# Patient Record
Sex: Male | Born: 1970 | Race: Black or African American | Hispanic: No | Smoking: Never smoker
Health system: Southern US, Community
[De-identification: ages and names within clinical notes are randomized; demographics above are authoritative.]

## PROBLEM LIST (undated history)

## (undated) DIAGNOSIS — Z95810 Presence of automatic (implantable) cardiac defibrillator: Secondary | ICD-10-CM

## (undated) DIAGNOSIS — R0600 Dyspnea, unspecified: Secondary | ICD-10-CM

## (undated) DIAGNOSIS — R569 Unspecified convulsions: Secondary | ICD-10-CM

## (undated) DIAGNOSIS — E785 Hyperlipidemia, unspecified: Secondary | ICD-10-CM

## (undated) DIAGNOSIS — I509 Heart failure, unspecified: Secondary | ICD-10-CM

## (undated) DIAGNOSIS — C801 Malignant (primary) neoplasm, unspecified: Secondary | ICD-10-CM

## (undated) DIAGNOSIS — G4733 Obstructive sleep apnea (adult) (pediatric): Secondary | ICD-10-CM

## (undated) DIAGNOSIS — E559 Vitamin D deficiency, unspecified: Secondary | ICD-10-CM

## (undated) DIAGNOSIS — I10 Essential (primary) hypertension: Secondary | ICD-10-CM

## (undated) DIAGNOSIS — E139 Other specified diabetes mellitus without complications: Secondary | ICD-10-CM

## (undated) DIAGNOSIS — N529 Male erectile dysfunction, unspecified: Secondary | ICD-10-CM

## (undated) DIAGNOSIS — I255 Ischemic cardiomyopathy: Secondary | ICD-10-CM

## (undated) DIAGNOSIS — I5020 Unspecified systolic (congestive) heart failure: Secondary | ICD-10-CM

## (undated) DIAGNOSIS — G709 Myoneural disorder, unspecified: Secondary | ICD-10-CM

## (undated) DIAGNOSIS — J45909 Unspecified asthma, uncomplicated: Secondary | ICD-10-CM

## (undated) DIAGNOSIS — E119 Type 2 diabetes mellitus without complications: Secondary | ICD-10-CM

## (undated) DIAGNOSIS — I219 Acute myocardial infarction, unspecified: Secondary | ICD-10-CM

## (undated) DIAGNOSIS — K219 Gastro-esophageal reflux disease without esophagitis: Secondary | ICD-10-CM

## (undated) DIAGNOSIS — I2510 Atherosclerotic heart disease of native coronary artery without angina pectoris: Secondary | ICD-10-CM

## (undated) HISTORY — PX: CORONARY ANGIOPLASTY: SHX604

---

## 2006-10-11 HISTORY — PX: WRIST SURGERY: SHX841

## 2007-08-23 ENCOUNTER — Ambulatory Visit: Payer: Self-pay | Admitting: Orthopedic Surgery

## 2007-08-30 ENCOUNTER — Ambulatory Visit: Payer: Self-pay | Admitting: Orthopedic Surgery

## 2009-06-19 ENCOUNTER — Ambulatory Visit: Payer: Self-pay | Admitting: Internal Medicine

## 2009-10-11 DIAGNOSIS — Z95810 Presence of automatic (implantable) cardiac defibrillator: Secondary | ICD-10-CM

## 2009-10-11 DIAGNOSIS — I219 Acute myocardial infarction, unspecified: Secondary | ICD-10-CM

## 2009-10-11 HISTORY — PX: CORONARY ANGIOPLASTY: SHX604

## 2009-10-11 HISTORY — DX: Presence of automatic (implantable) cardiac defibrillator: Z95.810

## 2009-10-11 HISTORY — DX: Acute myocardial infarction, unspecified: I21.9

## 2010-02-13 ENCOUNTER — Emergency Department: Payer: Self-pay | Admitting: Internal Medicine

## 2010-03-11 DIAGNOSIS — I219 Acute myocardial infarction, unspecified: Secondary | ICD-10-CM | POA: Insufficient documentation

## 2010-03-24 ENCOUNTER — Emergency Department: Payer: Self-pay | Admitting: Emergency Medicine

## 2010-04-06 ENCOUNTER — Inpatient Hospital Stay: Payer: Self-pay | Admitting: Internal Medicine

## 2010-04-20 ENCOUNTER — Inpatient Hospital Stay: Payer: Self-pay | Admitting: Internal Medicine

## 2010-05-14 ENCOUNTER — Ambulatory Visit: Payer: Self-pay | Admitting: Family

## 2010-06-17 ENCOUNTER — Ambulatory Visit: Payer: Self-pay | Admitting: Family

## 2010-07-25 ENCOUNTER — Observation Stay: Payer: Self-pay | Admitting: Internal Medicine

## 2010-08-30 HISTORY — PX: CARDIAC DEFIBRILLATOR PLACEMENT: SHX171

## 2010-09-24 ENCOUNTER — Ambulatory Visit: Payer: Self-pay | Admitting: Family

## 2011-01-07 ENCOUNTER — Ambulatory Visit: Payer: Self-pay | Admitting: Family

## 2011-08-18 DIAGNOSIS — K219 Gastro-esophageal reflux disease without esophagitis: Secondary | ICD-10-CM | POA: Insufficient documentation

## 2011-08-18 DIAGNOSIS — E785 Hyperlipidemia, unspecified: Secondary | ICD-10-CM | POA: Insufficient documentation

## 2011-08-18 DIAGNOSIS — I255 Ischemic cardiomyopathy: Secondary | ICD-10-CM | POA: Insufficient documentation

## 2011-08-18 DIAGNOSIS — Z95810 Presence of automatic (implantable) cardiac defibrillator: Secondary | ICD-10-CM | POA: Insufficient documentation

## 2011-09-15 DIAGNOSIS — E139 Other specified diabetes mellitus without complications: Secondary | ICD-10-CM | POA: Insufficient documentation

## 2011-11-19 ENCOUNTER — Emergency Department: Payer: Self-pay | Admitting: Emergency Medicine

## 2011-11-19 LAB — RAPID INFLUENZA A&B ANTIGENS

## 2011-12-13 ENCOUNTER — Emergency Department: Payer: Self-pay | Admitting: *Deleted

## 2011-12-13 LAB — CBC
HCT: 46 % (ref 40.0–52.0)
MCH: 30.2 pg (ref 26.0–34.0)
MCHC: 32.8 g/dL (ref 32.0–36.0)
MCV: 92 fL (ref 80–100)
Platelet: 145 10*3/uL — ABNORMAL LOW (ref 150–440)
RBC: 4.99 10*6/uL (ref 4.40–5.90)

## 2011-12-13 LAB — TROPONIN I: Troponin-I: 0.02 ng/mL

## 2011-12-13 LAB — COMPREHENSIVE METABOLIC PANEL
Albumin: 4 g/dL (ref 3.4–5.0)
Anion Gap: 7 (ref 7–16)
BUN: 11 mg/dL (ref 7–18)
Bilirubin,Total: 0.6 mg/dL (ref 0.2–1.0)
Chloride: 106 mmol/L (ref 98–107)
Co2: 29 mmol/L (ref 21–32)
Creatinine: 1.21 mg/dL (ref 0.60–1.30)
EGFR (African American): >60
Osmolality: 280 (ref 275–301)
Potassium: 4 mmol/L (ref 3.5–5.1)
SGOT(AST): 32 U/L (ref 15–37)
Total Protein: 7.4 g/dL (ref 6.4–8.2)

## 2011-12-14 LAB — TROPONIN I: Troponin-I: 0.02 ng/mL

## 2011-12-20 ENCOUNTER — Emergency Department: Payer: Self-pay | Admitting: Unknown Physician Specialty

## 2011-12-21 LAB — TROPONIN I: Troponin-I: <0.02 ng/mL

## 2011-12-21 LAB — COMPREHENSIVE METABOLIC PANEL
Albumin: 3.7 g/dL (ref 3.4–5.0)
Alkaline Phosphatase: 90 U/L (ref 50–136)
Anion Gap: 12 (ref 7–16)
BUN: 18 mg/dL (ref 7–18)
Calcium, Total: 8.6 mg/dL (ref 8.5–10.1)
Chloride: 102 mmol/L (ref 98–107)
Co2: 26 mmol/L (ref 21–32)
EGFR (African American): >60
Glucose: 233 mg/dL — ABNORMAL HIGH (ref 65–99)
Osmolality: 289 (ref 275–301)
SGOT(AST): 36 U/L (ref 15–37)
Sodium: 140 mmol/L (ref 136–145)

## 2011-12-21 LAB — CBC
HCT: 45 % (ref 40.0–52.0)
HGB: 14.9 g/dL (ref 13.0–18.0)
MCH: 30.5 pg (ref 26.0–34.0)
MCHC: 33.2 g/dL (ref 32.0–36.0)
MCV: 92 fL (ref 80–100)
Platelet: 131 10*3/uL — ABNORMAL LOW (ref 150–440)
RBC: 4.9 10*6/uL (ref 4.40–5.90)

## 2013-10-04 ENCOUNTER — Emergency Department: Payer: Self-pay | Admitting: Emergency Medicine

## 2014-01-21 ENCOUNTER — Emergency Department: Payer: Self-pay | Admitting: Emergency Medicine

## 2014-03-07 DIAGNOSIS — Z9889 Other specified postprocedural states: Secondary | ICD-10-CM | POA: Insufficient documentation

## 2015-01-15 ENCOUNTER — Emergency Department: Admit: 2015-01-15 | Disposition: A | Discharge status: Home / Self Care | Payer: Self-pay | Admitting: Student

## 2015-01-15 LAB — CBC WITH DIFFERENTIAL/PLATELET
Basophil #: 0 10*3/uL (ref 0.0–0.1)
Basophil %: 0.6 %
Eosinophil #: 0 10*3/uL (ref 0.0–0.7)
Eosinophil %: 0.2 %
HCT: 40.9 % (ref 40.0–52.0)
HGB: 13.5 g/dL (ref 13.0–18.0)
LYMPHS ABS: 2 10*3/uL (ref 1.0–3.6)
Lymphocyte %: 24.3 %
MCH: 29.7 pg (ref 26.0–34.0)
MCHC: 33.1 g/dL (ref 32.0–36.0)
MCV: 90 fL (ref 80–100)
MONO ABS: 0.6 x10 3/mm (ref 0.2–1.0)
MONOS PCT: 6.9 %
NEUTROS ABS: 5.7 10*3/uL (ref 1.4–6.5)
Neutrophil %: 68 %
PLATELETS: 140 10*3/uL — AB (ref 150–440)
RBC: 4.56 10*6/uL (ref 4.40–5.90)
RDW: 13.6 % (ref 11.5–14.5)
WBC: 8.4 10*3/uL (ref 3.8–10.6)

## 2015-01-15 LAB — BASIC METABOLIC PANEL
Anion Gap: 7 (ref 7–16)
BUN: 18 mg/dL
Calcium, Total: 8.7 mg/dL — ABNORMAL LOW
Chloride: 104 mmol/L
Co2: 25 mmol/L
Creatinine: 0.92 mg/dL
EGFR (African American): >60
GLUCOSE: 331 mg/dL — AB
Potassium: 4 mmol/L
Sodium: 136 mmol/L

## 2015-08-07 DIAGNOSIS — E1065 Type 1 diabetes mellitus with hyperglycemia: Secondary | ICD-10-CM | POA: Insufficient documentation

## 2015-08-19 DIAGNOSIS — R569 Unspecified convulsions: Secondary | ICD-10-CM | POA: Insufficient documentation

## 2015-08-27 ENCOUNTER — Other Ambulatory Visit: Payer: Self-pay | Admitting: Unknown Physician Specialty

## 2016-04-18 ENCOUNTER — Emergency Department: Payer: 59

## 2016-04-18 ENCOUNTER — Inpatient Hospital Stay
Admission: EM | Admit: 2016-04-18 | Discharge: 2016-04-21 | DRG: 637 | Disposition: A | Discharge status: Home / Self Care | Payer: 59 | Attending: Internal Medicine | Admitting: Internal Medicine

## 2016-04-18 ENCOUNTER — Encounter: Payer: Self-pay | Admitting: Emergency Medicine

## 2016-04-18 DIAGNOSIS — I2510 Atherosclerotic heart disease of native coronary artery without angina pectoris: Secondary | ICD-10-CM | POA: Diagnosis present

## 2016-04-18 DIAGNOSIS — E785 Hyperlipidemia, unspecified: Secondary | ICD-10-CM | POA: Diagnosis present

## 2016-04-18 DIAGNOSIS — E1010 Type 1 diabetes mellitus with ketoacidosis without coma: Principal | ICD-10-CM | POA: Diagnosis present

## 2016-04-18 DIAGNOSIS — K2970 Gastritis, unspecified, without bleeding: Secondary | ICD-10-CM | POA: Diagnosis present

## 2016-04-18 DIAGNOSIS — I252 Old myocardial infarction: Secondary | ICD-10-CM | POA: Diagnosis not present

## 2016-04-18 DIAGNOSIS — Z9114 Patient's other noncompliance with medication regimen: Secondary | ICD-10-CM | POA: Diagnosis not present

## 2016-04-18 DIAGNOSIS — R1013 Epigastric pain: Secondary | ICD-10-CM | POA: Diagnosis present

## 2016-04-18 DIAGNOSIS — E1110 Type 2 diabetes mellitus with ketoacidosis without coma: Secondary | ICD-10-CM

## 2016-04-18 DIAGNOSIS — J189 Pneumonia, unspecified organism: Secondary | ICD-10-CM | POA: Diagnosis present

## 2016-04-18 DIAGNOSIS — I10 Essential (primary) hypertension: Secondary | ICD-10-CM

## 2016-04-18 DIAGNOSIS — R109 Unspecified abdominal pain: Secondary | ICD-10-CM

## 2016-04-18 HISTORY — DX: Hyperlipidemia, unspecified: E78.5

## 2016-04-18 HISTORY — DX: Acute myocardial infarction, unspecified: I21.9

## 2016-04-18 HISTORY — DX: Essential (primary) hypertension: I10

## 2016-04-18 HISTORY — DX: Type 2 diabetes mellitus without complications: E11.9

## 2016-04-18 LAB — GLUCOSE, CAPILLARY
GLUCOSE-CAPILLARY: 459 mg/dL — AB (ref 65–99)
GLUCOSE-CAPILLARY: 366 mg/dL — AB (ref 65–99)
GLUCOSE-CAPILLARY: 436 mg/dL — AB (ref 65–99)
GLUCOSE-CAPILLARY: 355 mg/dL — AB (ref 65–99)
Glucose-Capillary: 398 mg/dL — ABNORMAL HIGH (ref 65–99)
Glucose-Capillary: 383 mg/dL — ABNORMAL HIGH (ref 65–99)

## 2016-04-18 LAB — COMPREHENSIVE METABOLIC PANEL
ALBUMIN: 3.6 g/dL (ref 3.5–5.0)
ALK PHOS: 125 U/L (ref 38–126)
ALT: 48 U/L (ref 17–63)
AST: 48 U/L — AB (ref 15–41)
Anion gap: 17 — ABNORMAL HIGH (ref 5–15)
BUN: 22 mg/dL — AB (ref 6–20)
CO2: 15 mmol/L — AB (ref 22–32)
Calcium: 8.6 mg/dL — ABNORMAL LOW (ref 8.9–10.3)
Chloride: 103 mmol/L (ref 101–111)
Creatinine, Ser: 1.21 mg/dL (ref 0.61–1.24)
GFR calc non Af Amer: >60 mL/min (ref >60)
GLUCOSE: 441 mg/dL — AB (ref 65–99)
Potassium: 4.6 mmol/L (ref 3.5–5.1)
Sodium: 135 mmol/L (ref 135–145)
Total Bilirubin: 2 mg/dL — ABNORMAL HIGH (ref 0.3–1.2)
Total Protein: 6.9 g/dL (ref 6.5–8.1)

## 2016-04-18 LAB — CBC
HCT: 47.2 % (ref 40.0–52.0)
Hemoglobin: 16 g/dL (ref 13.0–18.0)
MCH: 30.6 pg (ref 26.0–34.0)
MCHC: 34 g/dL (ref 32.0–36.0)
MCV: 90 fL (ref 80.0–100.0)
PLATELETS: 124 10*3/uL — AB (ref 150–440)
RBC: 5.24 MIL/uL (ref 4.40–5.90)
RDW: 13.1 % (ref 11.5–14.5)
WBC: 6.8 10*3/uL (ref 3.8–10.6)

## 2016-04-18 LAB — URINALYSIS COMPLETE WITH MICROSCOPIC (ARMC ONLY)
BILIRUBIN URINE: NEGATIVE
Glucose, UA: >500 mg/dL — AB
Leukocytes, UA: NEGATIVE
Nitrite: NEGATIVE
PROTEIN: 100 mg/dL — AB
SQUAMOUS EPITHELIAL / LPF: NONE SEEN
Specific Gravity, Urine: 1.028 (ref 1.005–1.030)
pH: 5 (ref 5.0–8.0)

## 2016-04-18 LAB — BASIC METABOLIC PANEL
ANION GAP: 11 (ref 5–15)
BUN: 24 mg/dL — ABNORMAL HIGH (ref 6–20)
CALCIUM: 7.7 mg/dL — AB (ref 8.9–10.3)
CHLORIDE: 107 mmol/L (ref 101–111)
CO2: 17 mmol/L — AB (ref 22–32)
CREATININE: 1.3 mg/dL — AB (ref 0.61–1.24)
GFR calc non Af Amer: >60 mL/min (ref >60)
Glucose, Bld: 392 mg/dL — ABNORMAL HIGH (ref 65–99)
Potassium: 4.9 mmol/L (ref 3.5–5.1)
SODIUM: 135 mmol/L (ref 135–145)

## 2016-04-18 LAB — LIPASE, BLOOD: Lipase: 12 U/L (ref 11–51)

## 2016-04-18 LAB — TROPONIN I

## 2016-04-18 MED ORDER — ONDANSETRON HCL 4 MG PO TABS
4.0000 mg | ORAL_TABLET | Freq: Four times a day (QID) | ORAL | Status: DC | PRN
  Administered 2016-04-18: 4 mg via ORAL
  Filled 2016-04-18: qty 1

## 2016-04-18 MED ORDER — ONDANSETRON HCL 4 MG/2ML IJ SOLN
4.0000 mg | Freq: Once | INTRAMUSCULAR | Status: AC
  Administered 2016-04-18: 4 mg via INTRAVENOUS

## 2016-04-18 MED ORDER — VITAMIN D 1000 UNITS PO TABS
2000.0000 [IU] | ORAL_TABLET | Freq: Every day | ORAL | Status: DC
  Administered 2016-04-18 – 2016-04-21 (×4): 2000 [IU] via ORAL
  Filled 2016-04-18 (×7): qty 2

## 2016-04-18 MED ORDER — GUAIFENESIN ER 600 MG PO TB12
1200.0000 mg | ORAL_TABLET | Freq: Two times a day (BID) | ORAL | Status: DC
  Administered 2016-04-18 – 2016-04-21 (×7): 1200 mg via ORAL
  Filled 2016-04-18 (×8): qty 2

## 2016-04-18 MED ORDER — ASPIRIN EC 81 MG PO TBEC
81.0000 mg | DELAYED_RELEASE_TABLET | ORAL | Status: DC
  Administered 2016-04-19 – 2016-04-21 (×3): 81 mg via ORAL
  Filled 2016-04-18 (×3): qty 1

## 2016-04-18 MED ORDER — LOSARTAN POTASSIUM 50 MG PO TABS
100.0000 mg | ORAL_TABLET | Freq: Every day | ORAL | Status: DC
  Administered 2016-04-18 – 2016-04-21 (×4): 100 mg via ORAL
  Filled 2016-04-18 (×3): qty 2

## 2016-04-18 MED ORDER — DEXTROSE 5 % IV SOLN
1.0000 g | Freq: Once | INTRAVENOUS | Status: AC
  Administered 2016-04-18: 1 g via INTRAVENOUS
  Filled 2016-04-18: qty 10

## 2016-04-18 MED ORDER — METOPROLOL TARTRATE 25 MG PO TABS
ORAL_TABLET | ORAL | Status: AC
  Administered 2016-04-18: 25 mg via ORAL
  Filled 2016-04-18: qty 1

## 2016-04-18 MED ORDER — HYDRALAZINE HCL 20 MG/ML IJ SOLN
10.0000 mg | INTRAMUSCULAR | Status: DC | PRN
  Administered 2016-04-20 – 2016-04-21 (×2): 10 mg via INTRAVENOUS
  Filled 2016-04-18 (×2): qty 1

## 2016-04-18 MED ORDER — FLUTICASONE PROPIONATE 50 MCG/ACT NA SUSP
2.0000 | Freq: Every day | NASAL | Status: DC | PRN
  Administered 2016-04-19 – 2016-04-21 (×2): 2 via NASAL
  Filled 2016-04-18 (×2): qty 16

## 2016-04-18 MED ORDER — HEPARIN SODIUM (PORCINE) 5000 UNIT/ML IJ SOLN
5000.0000 [IU] | Freq: Three times a day (TID) | INTRAMUSCULAR | Status: DC
  Administered 2016-04-18 – 2016-04-21 (×8): 5000 [IU] via SUBCUTANEOUS
  Filled 2016-04-18 (×8): qty 1

## 2016-04-18 MED ORDER — BISACODYL 10 MG RE SUPP: 10.0000 mg | Freq: Every day | RECTAL | Status: DC | PRN

## 2016-04-18 MED ORDER — SODIUM CHLORIDE 0.9 % IV BOLUS (SEPSIS)
1000.0000 mL | Freq: Once | INTRAVENOUS | Status: AC
  Administered 2016-04-18: 1000 mL via INTRAVENOUS

## 2016-04-18 MED ORDER — METOCLOPRAMIDE HCL 10 MG PO TABS
10.0000 mg | ORAL_TABLET | Freq: Every day | ORAL | Status: DC
  Administered 2016-04-18 – 2016-04-20 (×3): 10 mg via ORAL
  Filled 2016-04-18 (×3): qty 1

## 2016-04-18 MED ORDER — INSULIN ASPART 100 UNIT/ML IV SOLN
12.0000 [IU] | Freq: Once | INTRAVENOUS | Status: AC
  Administered 2016-04-18: 12 [IU] via INTRAVENOUS
  Filled 2016-04-18: qty 0.12

## 2016-04-18 MED ORDER — DIGOXIN 125 MCG PO TABS
125.0000 ug | ORAL_TABLET | Freq: Every day | ORAL | Status: DC
  Administered 2016-04-19 – 2016-04-21 (×3): 125 ug via ORAL
  Filled 2016-04-18 (×3): qty 1

## 2016-04-18 MED ORDER — METOPROLOL TARTRATE 25 MG PO TABS
25.0000 mg | ORAL_TABLET | Freq: Two times a day (BID) | ORAL | Status: DC
  Administered 2016-04-18: 25 mg via ORAL

## 2016-04-18 MED ORDER — HYDROMORPHONE HCL 1 MG/ML IJ SOLN
1.0000 mg | INTRAMUSCULAR | Status: DC | PRN
  Administered 2016-04-20 (×2): 1 mg via INTRAVENOUS
  Filled 2016-04-18 (×2): qty 1

## 2016-04-18 MED ORDER — INSULIN ASPART 100 UNIT/ML ~~LOC~~ SOLN
12.0000 [IU] | Freq: Once | SUBCUTANEOUS | Status: AC
  Administered 2016-04-18: 12 [IU] via SUBCUTANEOUS
  Filled 2016-04-18: qty 12

## 2016-04-18 MED ORDER — INSULIN ASPART 100 UNIT/ML ~~LOC~~ SOLN: 0.0000 [IU] | Freq: Three times a day (TID) | SUBCUTANEOUS | Status: DC

## 2016-04-18 MED ORDER — DEXTROSE 5 % IV SOLN
500.0000 mg | Freq: Every day | INTRAVENOUS | Status: DC
  Administered 2016-04-18: 500 mg via INTRAVENOUS
  Filled 2016-04-18 (×2): qty 500

## 2016-04-18 MED ORDER — IPRATROPIUM-ALBUTEROL 0.5-2.5 (3) MG/3ML IN SOLN
3.0000 mL | Freq: Four times a day (QID) | RESPIRATORY_TRACT | Status: DC
  Administered 2016-04-18 – 2016-04-19 (×3): 3 mL via RESPIRATORY_TRACT
  Filled 2016-04-18 (×4): qty 3

## 2016-04-18 MED ORDER — SODIUM CHLORIDE 0.9% FLUSH
3.0000 mL | Freq: Two times a day (BID) | INTRAVENOUS | Status: DC
Start: 2016-04-18 — End: 2016-04-21
  Administered 2016-04-19 – 2016-04-20 (×3): 3 mL via INTRAVENOUS

## 2016-04-18 MED ORDER — HYDROMORPHONE HCL 1 MG/ML IJ SOLN
1.0000 mg | Freq: Once | INTRAMUSCULAR | Status: AC
  Administered 2016-04-18: 1 mg via INTRAVENOUS

## 2016-04-18 MED ORDER — FUROSEMIDE 40 MG PO TABS
20.0000 mg | ORAL_TABLET | Freq: Every day | ORAL | Status: DC
  Administered 2016-04-18 – 2016-04-21 (×4): 20 mg via ORAL
  Filled 2016-04-18 (×4): qty 1

## 2016-04-18 MED ORDER — CARVEDILOL 12.5 MG PO TABS
12.5000 mg | ORAL_TABLET | Freq: Two times a day (BID) | ORAL | Status: DC
  Administered 2016-04-18 – 2016-04-21 (×7): 12.5 mg via ORAL
  Filled 2016-04-18 (×7): qty 1

## 2016-04-18 MED ORDER — POTASSIUM CHLORIDE CRYS ER 10 MEQ PO TBCR
10.0000 meq | EXTENDED_RELEASE_TABLET | Freq: Every day | ORAL | Status: DC
  Administered 2016-04-18 – 2016-04-21 (×4): 10 meq via ORAL
  Filled 2016-04-18 (×4): qty 1

## 2016-04-18 MED ORDER — SODIUM CHLORIDE 0.9 % IV BOLUS (SEPSIS)
500.0000 mL | Freq: Once | INTRAVENOUS | Status: AC
Start: 2016-04-18 — End: 2016-04-18
  Administered 2016-04-18: 500 mL via INTRAVENOUS

## 2016-04-18 MED ORDER — SODIUM CHLORIDE 0.9 % IV SOLN
INTRAVENOUS | Status: DC
  Administered 2016-04-18 – 2016-04-20 (×6): via INTRAVENOUS

## 2016-04-18 MED ORDER — DOCUSATE SODIUM 100 MG PO CAPS
100.0000 mg | ORAL_CAPSULE | Freq: Two times a day (BID) | ORAL | Status: DC
  Administered 2016-04-18 – 2016-04-20 (×5): 100 mg via ORAL
  Filled 2016-04-18 (×7): qty 1

## 2016-04-18 MED ORDER — PIPERACILLIN-TAZOBACTAM 3.375 G IVPB
3.3750 g | Freq: Three times a day (TID) | INTRAVENOUS | Status: DC
  Administered 2016-04-18 – 2016-04-20 (×7): 3.375 g via INTRAVENOUS
  Filled 2016-04-18 (×9): qty 50

## 2016-04-18 MED ORDER — INSULIN DETEMIR 100 UNIT/ML ~~LOC~~ SOLN
25.0000 [IU] | Freq: Two times a day (BID) | SUBCUTANEOUS | Status: DC
  Administered 2016-04-18 – 2016-04-19 (×4): 25 [IU] via SUBCUTANEOUS
  Filled 2016-04-18 (×7): qty 0.25

## 2016-04-18 MED ORDER — ACETAMINOPHEN 325 MG PO TABS
650.0000 mg | ORAL_TABLET | Freq: Four times a day (QID) | ORAL | Status: DC | PRN
  Administered 2016-04-18 – 2016-04-19 (×2): 650 mg via ORAL
  Filled 2016-04-18: qty 2

## 2016-04-18 MED ORDER — LOSARTAN POTASSIUM 25 MG PO TABS
ORAL_TABLET | ORAL | Status: AC
  Administered 2016-04-18: 100 mg via ORAL
  Filled 2016-04-18: qty 4

## 2016-04-18 MED ORDER — ONDANSETRON HCL 4 MG/2ML IJ SOLN: 4.0000 mg | Freq: Four times a day (QID) | INTRAMUSCULAR | Status: DC | PRN

## 2016-04-18 MED ORDER — SODIUM CHLORIDE 0.9 % IV BOLUS (SEPSIS)
1000.0000 mL | Freq: Once | INTRAVENOUS | Status: AC
  Administered 2016-04-18: 500 mL via INTRAVENOUS

## 2016-04-18 MED ORDER — ALUM & MAG HYDROXIDE-SIMETH 200-200-20 MG/5ML PO SUSP
15.0000 mL | ORAL | Status: DC | PRN
  Administered 2016-04-18 – 2016-04-20 (×4): 15 mL via ORAL
  Filled 2016-04-18 (×4): qty 30

## 2016-04-18 MED ORDER — ACETAMINOPHEN 650 MG RE SUPP: 650.0000 mg | Freq: Four times a day (QID) | RECTAL | Status: DC | PRN

## 2016-04-18 MED ORDER — ATORVASTATIN CALCIUM 20 MG PO TABS
40.0000 mg | ORAL_TABLET | Freq: Every day | ORAL | Status: DC
  Administered 2016-04-18 – 2016-04-20 (×3): 40 mg via ORAL
  Filled 2016-04-18 (×3): qty 2

## 2016-04-18 MED ORDER — INSULIN REGULAR HUMAN 100 UNIT/ML IJ SOLN: 12.0000 [IU] | Freq: Once | INTRAMUSCULAR | Status: DC

## 2016-04-18 MED ORDER — INSULIN ASPART 100 UNIT/ML ~~LOC~~ SOLN
6.0000 [IU] | Freq: Once | SUBCUTANEOUS | Status: AC
  Administered 2016-04-18: 6 [IU] via INTRAVENOUS

## 2016-04-18 NOTE — ED Notes (Signed)
Pt returned from xray

## 2016-04-18 NOTE — ED Notes (Signed)
Pt transported for Echocardiogram

## 2016-04-18 NOTE — H&P (Signed)
History and Physical    Joseph [name] [id number] DOB: [birth year] DOA: 04/18/2016  Referring physician: Dr. Marcelene Butte PCP: [facility]  Specialists: none  Chief Complaint: abdominal pain with N/V/D  HPI: Joseph [name] is a 45 y.o. male has a past medical history significant for Dm and HTn who presents to the ER with epigatric and RUQ abdominal pain with N/V/D. Has had a recent sinus infection. Has not been taking his insulin as prescribed. In the ER, the patient's blood sugars with >400 with an anion gap noted. CXR reveals pneumonia. BP is extremely high. He is now admitted. Denies Cp or SOB.  Review of Systems: The patient denies anorexia, fever, weight loss,, vision loss, decreased hearing, hoarseness, chest pain, syncope, dyspnea on exertion, peripheral edema, balance deficits, hemoptysis,  melena, hematochezia, severe indigestion/heartburn, hematuria, incontinence, genital sores, muscle weakness, suspicious skin lesions, transient blindness, difficulty walking, depression, unusual weight change, abnormal bleeding, enlarged lymph nodes, angioedema, and breast masses.   Past Medical History  Diagnosis Date  . Diabetes mellitus without complication ([place])   . Hypertension   . Hyperlipidemia   . Myocardial infarction [facility])    Past Surgical History  Procedure Laterality Date  . Cardiac surgery    . Cardiac defibrillator placement     Social History:  reports that he has never smoked. He does not have any smokeless tobacco history on file. He reports that he does not drink alcohol. His drug history is not on file.  Allergies  Allergen Reactions  . Vancomycin Shortness Of Breath  . Lisinopril Rash    History reviewed. No pertinent family history.  Prior to Admission medications   Medication Sig Start Date End Date Taking? Authorizing Provider  aspirin EC 81 MG tablet Take 81 mg by mouth every morning.   Yes Historical Provider, MD  atorvastatin (LIPITOR) 40 MG  tablet Take 40 mg by mouth at bedtime. 02/09/16  Yes Historical Provider, MD  carvedilol (COREG) 12.5 MG tablet Take 12.5 mg by mouth 2 (two) times daily. 02/16/16  Yes Historical Provider, MD  Cholecalciferol (VITAMIN D3) 2000 units capsule Take 2,000 Units by mouth daily. 09/15/11  Yes Historical Provider, MD  digoxin (LANOXIN) 0.125 MG tablet Take 125 mcg by mouth daily. 02/16/16  Yes Historical Provider, MD  fluticasone (FLONASE) 50 MCG/ACT nasal spray Place 2 sprays into both nostrils daily as needed. For allergies/rhinitis. 01/26/16  Yes Historical Provider, MD  furosemide (LASIX) 20 MG tablet Take 20 mg by mouth daily. 02/09/16  Yes Historical Provider, MD  HUMALOG KWIKPEN 100 UNIT/ML KiwkPen Inject 16-20 Units into the skin 3 (three) times daily before meals. 16 units subcutaneous in the morning, 18 units subcutaneous at lunchtime, and 20 units subcutaneous at night. Plus sliding scale as directed. 03/09/16  Yes Historical Provider, MD  Insulin Detemir (LEVEMIR FLEXTOUCH) 100 UNIT/ML Pen Inject 13-30 Units into the skin See admin instructions. Inject 30 units subcutaneous in the morning, and Inject 13 units subcutaneous every night. 09/17/15  Yes Historical Provider, MD  losartan (COZAAR) 100 MG tablet Take 100 mg by mouth at bedtime.   Yes Historical Provider, MD  metoCLOPramide (REGLAN) 10 MG tablet Take 10 mg by mouth at bedtime. 12/15/11  Yes Historical Provider, MD  potassium chloride (MICRO-K) 10 MEQ CR capsule Take 10 mEq by mouth daily. 02/23/16  Yes Historical Provider, MD   Physical Exam: Filed Vitals:   04/18/16 1001 04/18/16 1130  BP: 146/86 180/95  Pulse: 100 96  Temp: 98.6  F (37 C)   TempSrc: Oral   Resp: 18 16  Height: 6\' 1"  (1.854 m)   Weight: 97.523 kg (215 lb)   SpO2: 98% 96%     General:  No apparent distress, WDWN, [place]/AT  Eyes: PERRL, EOMI, no scleral icterus, conjunctiva clear  ENT: moist oropharynx without lesions, TM's benign, dentition fair  Neck: supple, no  lymphadenopathy. No bruits or thyromegaly  Cardiovascular: regular rate without MRG; 2+ peripheral pulses, no JVD, no peripheral edema  Respiratory: basilar rhonchi, good air movement without wheezing or crackles. Respiratory effort normal  Abdomen: soft,  tender to palpation in the epigastrum, positive bowel sounds, no guarding, no rebound  Skin: no rashes or lesions  Musculoskeletal: normal bulk and tone, no joint swelling  Psychiatric: normal mood and affect, A&OX3  Neurologic: CN 2-12 grossly intact, Motor strength 5/5 in all 4 groups with symmetric DTR's and non-focal sensory exam  Labs on Admission:  Basic Metabolic Panel:  Recent Labs Lab 04/18/16 1011  NA 135  K 4.6  CL 103  CO2 15*  GLUCOSE 441*  BUN 22*  CREATININE 1.21  CALCIUM 8.6*   Liver Function Tests:  Recent Labs Lab 04/18/16 1011  AST 48*  ALT 48  ALKPHOS 125  BILITOT 2.0*  PROT 6.9  ALBUMIN 3.6    Recent Labs Lab 04/18/16 1011  LIPASE 12   No results for input(s): AMMONIA in the last 168 hours. CBC:  Recent Labs Lab 04/18/16 1011  WBC 6.8  HGB 16.0  HCT 47.2  MCV 90.0  PLT 124*   Cardiac Enzymes:  Recent Labs Lab 04/18/16 1011  TROPONINI <0.03    BNP (last 3 results) No results for input(s): BNP in the last 8760 hours.  ProBNP (last 3 results) No results for input(s): PROBNP in the last 8760 hours.  CBG:  Recent Labs Lab 04/18/16 1013 04/18/16 1221  GLUCAP 459* 366*    Radiological Exams on Admission: Dg Chest 2 View  04/18/2016  CLINICAL DATA:  Pain for 1 day EXAM: CHEST  2 VIEW COMPARISON:  July 24, 2010 FINDINGS: There is patchy infiltrate in the right upper lobe anteriorly. The lungs elsewhere are clear. Heart size and pulmonary vascularity are normal. No adenopathy. There is a pacemaker with lead tip attached to the right ventricle. No bone lesions. IMPRESSION: Patchy infiltrate consistent with pneumonia anterior segment right upper lobe. Lungs elsewhere  clear. Stable cardiac silhouette. Pacemaker present with lead tip attached to right ventricle. Electronically Signed   By: Lowella Grip III M.D.   On: 04/18/2016 11:13    EKG: Independently reviewed.  Assessment/Plan Principal Problem:   DKA (diabetic ketoacidoses) (HCC) Active Problems:   CAP (community acquired pneumonia)   Accelerated hypertension   Abdominal pain   Will admit to floor with IV fluids and IV ABX and follow sugars closely. [place] of abdomen ordered. Optimize BP regimen. Repeat labs in AM.  Diet: clear liquids Fluids: NS@125  DVT Prophylaxis: SQ Heparin  Code Status: FULL  Family Communication: yes  Disposition Plan: home  Time spent: 50 min

## 2016-04-18 NOTE — ED Notes (Signed)
Pt resting in bed, family at bedside.

## 2016-04-18 NOTE — ED Notes (Signed)
Patient transported to X-ray 

## 2016-04-18 NOTE — ED Provider Notes (Signed)
Time Seen: Approximately 1038 I have reviewed the triage notes  Chief Complaint: Abdominal Pain; Diarrhea; and Emesis   History of Present Illness: Joseph [name] is a 45 y.o. male *who has history of insulin-dependent diabetes and previous cardiovascular disease with a heart catheterization and stent placement in 2011. Patient states he started feeling bad couple of days ago with nasal congestion and then started developing some nausea with occasional vomiting. Patient's only vomited one time today. He states he's developed epigastric abdominal pain and has had 3 loose watery stools today. He states he still feels nauseated. He tried some Naprosyn at home without relief   Past Medical History  Diagnosis Date  . Diabetes mellitus without complication ([place])   . Hypertension   . Hyperlipidemia   . Myocardial infarction ([place])     There are no active problems to display for this patient.   Past Surgical History  Procedure Laterality Date  . Cardiac surgery    . Cardiac defibrillator placement      Past Surgical History  Procedure Laterality Date  . Cardiac surgery    . Cardiac defibrillator placement      No current outpatient prescriptions on file.  Allergies:  Vancomycin and Lisinopril  Family History: History reviewed. No pertinent family history.  Social History: Social History  Substance Use Topics  . Smoking status: Never Smoker   . Smokeless tobacco: None  . Alcohol Use: No     Review of Systems:   10 point review of systems was performed and was otherwise negative:  Constitutional: No fever Eyes: No visual disturbances ENT: No sore throat, ear pain Cardiac: No chest pain Respiratory: No shortness of breath, wheezing, or stridor Abdomen: Epigastric abdominal pain without radiation to the back or flank area. Endocrine: No weight loss, No night sweats Extremities: No peripheral edema, cyanosis Skin: No rashes, easy bruising Neurologic: No focal  weakness, trouble with speech or swollowing Urologic: No dysuria, Hematuria, He does have urinary frequency  Physical Exam:  ED Triage Vitals  Enc Vitals Group     BP 04/18/16 1001 146/86 mmHg     Pulse Rate 04/18/16 1001 100     Resp 04/18/16 1001 18     Temp 04/18/16 1001 98.6 F (37 C)     Temp Source 04/18/16 1001 Oral     SpO2 04/18/16 1001 98 %     Weight 04/18/16 1001 215 lb (97.523 kg)     Height 04/18/16 1001 6\' 1"  (1.854 m)     Head Cir --      Peak Flow --      Pain Score 04/18/16 0958 10     Pain Loc --      Pain Edu? --      Excl. in [place]? --     General: Awake , Alert , and Oriented times 3; GCS 15 Head: Normal cephalic , atraumatic Eyes: Pupils equal , round, reactive to light Nose/Throat: No nasal drainage, patent upper airway without erythema or exudate.  Neck: Supple, Full range of motion, No anterior adenopathy or palpable thyroid masses Lungs: Clear to ascultation without wheezes , rhonchi, or rales Heart: Regular rate, regular rhythm without murmurs , gallops , or rubs Abdomen: Soft, non tender without rebound, guarding , or rigidity; bowel sounds positive and symmetric in all 4 quadrants. No organomegaly .        Extremities: 2 plus symmetric pulses. No edema, clubbing or cyanosis Neurologic: normal ambulation, Motor symmetric without deficits, sensory  intact Skin: warm, dry, no rashes   Labs:   All laboratory work was reviewed including any pertinent negatives or positives listed below:  Labs Reviewed  COMPREHENSIVE METABOLIC PANEL - Abnormal; Notable for the following:    CO2 15 (*)    Glucose, Bld 441 (*)    BUN 22 (*)    Calcium 8.6 (*)    AST 48 (*)    Total Bilirubin 2.0 (*)    Anion gap 17 (*)    All other components within normal limits  CBC - Abnormal; Notable for the following:    Platelets 124 (*)    All other components within normal limits  URINALYSIS COMPLETEWITH MICROSCOPIC ([facility] ONLY) - Abnormal; Notable for the following:     Color, Urine YELLOW (*)    APPearance CLEAR (*)    Glucose, UA >500 (*)    Ketones, ur 2+ (*)    Hgb urine dipstick 2+ (*)    Protein, ur 100 (*)    Bacteria, UA RARE (*)    All other components within normal limits  GLUCOSE, CAPILLARY - Abnormal; Notable for the following:    Glucose-Capillary 459 (*)    All other components within normal limits  LIPASE, BLOOD  TROPONIN I    EKG:  ED ECG REPORT I, Daymon Larsen, the attending physician, personally viewed and interpreted this ECG.  Date: 04/18/2016 EKG Time: 1008 Rate: *88 Rhythm: normal sinus rhythm QRS Axis: normal Intervals: normal ST/T Wave abnormalities: normal Conduction Disturbances: none Narrative Interpretation: unremarkable Right ventricular hypertrophy No acute ischemic changes   Radiology:     DG Chest 2 View (Final result) Result time: 04/18/16 11:13:58   Final result by Rad Results In Interface (04/18/16 11:13:58)   Narrative:   CLINICAL DATA: Pain for 1 day  EXAM: CHEST 2 VIEW  COMPARISON: July 24, 2010  FINDINGS: There is patchy infiltrate in the right upper lobe anteriorly. The lungs elsewhere are clear. Heart size and pulmonary vascularity are normal. No adenopathy. There is a pacemaker with lead tip attached to the right ventricle. No bone lesions.  IMPRESSION: Patchy infiltrate consistent with pneumonia anterior segment right upper lobe. Lungs elsewhere clear. Stable cardiac silhouette. Pacemaker present with lead tip attached to right ventricle.   Electronically Signed By: Lowella Grip III M.D. On: 04/18/2016 11:13       I personally reviewed the radiologic studies    Critical Care: * CRITICAL CARE Performed by: Daymon Larsen   Total critical care time: 37 minutes  Critical care time was exclusive of separately billable procedures and treating other patients.  Critical care was necessary to treat or prevent imminent or life-threatening  deterioration.  Critical care was time spent personally by me on the following activities: development of treatment plan with patient and/or surrogate as well as nursing, discussions with consultants, evaluation of patient's response to treatment, examination of patient, obtaining history from patient or surrogate, ordering and performing treatments and interventions, ordering and review of laboratory studies, ordering and review of radiographic studies, pulse oximetry and re-evaluation of patient's condition. Initial treatment and management of diabetic ketoacidosis    ED Course:  Patient's stay showed some symptomatic improvement his nausea and vomiting he was given Dilaudid and Zofran for pain and started on an IV fluid bolus especially when his urine came back positive for ketosis with an anion gap of 17. Patient was started on insulin bolus and was initiated on 2 L fluid bolus here in emergency department. Patient's  chest x-ray appears to show some early pneumonia which may be the source of his rapidly progressive DKA. Patient does admit to not taking his medication over the last 2 days because he "" felt bad "". He denies any productive cough or wheezing.   Assessment:  Acute diabetic ketoacidosis Community-acquired pneumonia   Final Clinical Impression:   Final diagnoses:  Epigastric pain     Plan: * Inpatient management           Daymon Larsen, MD 04/18/16 1129

## 2016-04-18 NOTE — ED Notes (Signed)
EDP at bedside, pt states sinus infection that began on Thursday, states vomiting and diarrhea with fever since Friday, pt states epigastric pain sharp in nature, states hx of MI with stents, states he has not taken his BP medication in 2 days due to not feeling well

## 2016-04-18 NOTE — ED Notes (Signed)
Pt states sxs started on Thursday with sinus infection sxs along with generalized aches and pains. Pt states he started vomiting x1 today and diarrhea x3-4 today.  Pt reports overnight epigastric pain developed.

## 2016-04-18 NOTE — Progress Notes (Signed)
Patient's blood sugar was checked by NT and recorded at 436.  Recheck done by RN and recorded at 398. Dr. Doy Hutching called and ordered 12units regular insulin IV once with a recheck 2 hours after.  Insulin had to be drawn by pharmacy for IV administration and once received patient was given IV insulin.  Will need recheck of blood sugar at 1945. Lauris Poag, RN 04/18/16  1900

## 2016-04-19 ENCOUNTER — Inpatient Hospital Stay: Payer: 59

## 2016-04-19 ENCOUNTER — Inpatient Hospital Stay
Admit: 2016-04-19 | Discharge: 2016-04-19 | Disposition: A | Discharge status: Home / Self Care | Payer: 59 | Attending: Internal Medicine | Admitting: Internal Medicine

## 2016-04-19 LAB — COMPREHENSIVE METABOLIC PANEL
ALBUMIN: 2.9 g/dL — AB (ref 3.5–5.0)
ALT: 34 U/L (ref 17–63)
ANION GAP: 5 (ref 5–15)
AST: 31 U/L (ref 15–41)
Alkaline Phosphatase: 87 U/L (ref 38–126)
BUN: 26 mg/dL — AB (ref 6–20)
CHLORIDE: 112 mmol/L — AB (ref 101–111)
CO2: 20 mmol/L — AB (ref 22–32)
Calcium: 7.7 mg/dL — ABNORMAL LOW (ref 8.9–10.3)
Creatinine, Ser: 1.09 mg/dL (ref 0.61–1.24)
GFR calc Af Amer: >60 mL/min (ref >60)
GFR calc non Af Amer: >60 mL/min (ref >60)
GLUCOSE: 200 mg/dL — AB (ref 65–99)
POTASSIUM: 3.9 mmol/L (ref 3.5–5.1)
SODIUM: 137 mmol/L (ref 135–145)
Total Bilirubin: 0.9 mg/dL (ref 0.3–1.2)
Total Protein: 5.7 g/dL — ABNORMAL LOW (ref 6.5–8.1)

## 2016-04-19 LAB — GLUCOSE, CAPILLARY
GLUCOSE-CAPILLARY: 128 mg/dL — AB (ref 65–99)
GLUCOSE-CAPILLARY: 207 mg/dL — AB (ref 65–99)
GLUCOSE-CAPILLARY: 154 mg/dL — AB (ref 65–99)
GLUCOSE-CAPILLARY: 127 mg/dL — AB (ref 65–99)
Glucose-Capillary: 212 mg/dL — ABNORMAL HIGH (ref 65–99)
Glucose-Capillary: 312 mg/dL — ABNORMAL HIGH (ref 65–99)

## 2016-04-19 LAB — CBC
HEMATOCRIT: 42 % (ref 40.0–52.0)
HEMOGLOBIN: 14.4 g/dL (ref 13.0–18.0)
MCH: 30.5 pg (ref 26.0–34.0)
MCHC: 34.3 g/dL (ref 32.0–36.0)
MCV: 89.1 fL (ref 80.0–100.0)
Platelets: 115 10*3/uL — ABNORMAL LOW (ref 150–440)
RBC: 4.72 MIL/uL (ref 4.40–5.90)
RDW: 13.3 % (ref 11.5–14.5)
WBC: 6.8 10*3/uL (ref 3.8–10.6)

## 2016-04-19 LAB — ECHOCARDIOGRAM COMPLETE
HEIGHTINCHES: 73 in
WEIGHTICAEL: 3425.6 [oz_av]

## 2016-04-19 MED ORDER — IPRATROPIUM-ALBUTEROL 0.5-2.5 (3) MG/3ML IN SOLN: 3.0000 mL | RESPIRATORY_TRACT | Status: DC | PRN

## 2016-04-19 MED ORDER — AZITHROMYCIN 250 MG PO TABS
500.0000 mg | ORAL_TABLET | Freq: Every day | ORAL | Status: DC
  Administered 2016-04-19 – 2016-04-20 (×2): 500 mg via ORAL
  Filled 2016-04-19 (×2): qty 2

## 2016-04-19 MED ORDER — INSULIN ASPART 100 UNIT/ML ~~LOC~~ SOLN
12.0000 [IU] | Freq: Once | SUBCUTANEOUS | Status: AC
Start: 2016-04-19 — End: 2016-04-19
  Administered 2016-04-19: 12 [IU] via SUBCUTANEOUS
  Filled 2016-04-19: qty 12

## 2016-04-19 MED ORDER — INSULIN ASPART 100 UNIT/ML ~~LOC~~ SOLN
0.0000 [IU] | SUBCUTANEOUS | Status: DC
  Administered 2016-04-19: 3 [IU] via SUBCUTANEOUS
  Administered 2016-04-19 (×2): 5 [IU] via SUBCUTANEOUS
  Administered 2016-04-19 (×2): 2 [IU] via SUBCUTANEOUS
  Administered 2016-04-20: 3 [IU] via SUBCUTANEOUS
  Administered 2016-04-20: 5 [IU] via SUBCUTANEOUS
  Administered 2016-04-20: 3 [IU] via SUBCUTANEOUS
  Administered 2016-04-21: 5 [IU] via SUBCUTANEOUS
  Administered 2016-04-21 (×3): 3 [IU] via SUBCUTANEOUS
  Filled 2016-04-19: qty 5
  Filled 2016-04-19: qty 3
  Filled 2016-04-19 (×2): qty 5
  Filled 2016-04-19 (×3): qty 3
  Filled 2016-04-19: qty 2
  Filled 2016-04-19 (×2): qty 3
  Filled 2016-04-19: qty 5
  Filled 2016-04-19: qty 2

## 2016-04-19 NOTE — Progress Notes (Signed)
PHARMACIST - PHYSICIAN COMMUNICATION DR:   Claria Dice CONCERNING: Antibiotic IV to Oral Route Change Policy  RECOMMENDATION: This patient is receiving Azithromycin by the intravenous route.  Based on criteria approved by the Pharmacy and Therapeutics Committee, the antibiotic(s) is/are being converted to the equivalent oral dose form(s).   DESCRIPTION: These criteria include:  Patient being treated for a respiratory tract infection, urinary tract infection, cellulitis or clostridium difficile associated diarrhea if on metronidazole  The patient is not neutropenic and does not exhibit a GI malabsorption state  The patient is eating (either orally or via tube) and/or has been taking other orally administered medications for a least 24 hours  The patient is improving clinically and has a Tmax < 100.5  If you have questions about this conversion, please contact the Pharmacy Department  []   [phone] )  Joseph [name] [x]   [phone] )  Joseph [name] []   [phone] )  Joseph [name] []   [phone] )  Joseph [name] []   [phone] )  Joseph [name]    Larene Beach, PharmD

## 2016-04-19 NOTE — Progress Notes (Signed)
Pt's blood sugar elevated 383 and temp 100.9. MD made aware, orders placed and admin. BS 312 at 00:13. MD notified and 12 U insulin ordered and admin., Pt's BS to be checked Q 4hrs.

## 2016-04-19 NOTE — Progress Notes (Signed)
A RT protocol assessment evaluation was proformed and because of the assessment score of 3 the nebulizers were changed to prn. The pt. Was instructed to call if he felt sob or wheezy. I started him on an incentive spirometer for volume expansion.

## 2016-04-19 NOTE — Progress Notes (Signed)
[facility] NAME: Joseph [name]    MR#:  [id number]  DATE OF BIRTH:  06/07/1971  SUBJECTIVE:  CHIEF COMPLAINT:   Chief Complaint  Patient presents with  . Abdominal Pain  . Diarrhea  . Emesis      Came with vomiting and nausea, also had some cough for last few days.   Found to be in DKA, and chest x-ray was showing pneumonia.   DKA improved with Insulin, no new complains.    REVIEW OF SYSTEMS:  CONSTITUTIONAL: No fever, fatigue or weakness.  EYES: No blurred or double vision.  EARS, NOSE, AND THROAT: No tinnitus or ear pain.  RESPIRATORY: Some cough, no shortness of breath, wheezing or hemoptysis.  CARDIOVASCULAR: No chest pain, orthopnea, edema.  GASTROINTESTINAL: he had nausea, vomiting, No diarrhea or abdominal pain.  GENITOURINARY: No dysuria, hematuria.  ENDOCRINE: No polyuria, nocturia,  HEMATOLOGY: No anemia, easy bruising or bleeding SKIN: No rash or lesion. MUSCULOSKELETAL: No joint pain or arthritis.   NEUROLOGIC: No tingling, numbness, weakness.  PSYCHIATRY: No anxiety or depression.   ROS  DRUG ALLERGIES:   Allergies  Allergen Reactions  . Vancomycin Shortness Of Breath  . Lisinopril Rash    VITALS:  Blood pressure 157/87, pulse 92, temperature 98.5 F (36.9 C), temperature source Oral, resp. rate 16, height 6\' 1"  (1.854 m), weight 97.115 kg (214 lb 1.6 oz), SpO2 98 %.  PHYSICAL EXAMINATION:  GENERAL:  45 y.o.-year-old patient lying in the bed with no acute distress.  EYES: Pupils equal, round, reactive to light and accommodation. No scleral icterus. Extraocular muscles intact.  HEENT: Head atraumatic, normocephalic. Oropharynx and nasopharynx clear.  NECK:  Supple, no jugular venous distention. No thyroid enlargement, no tenderness.  LUNGS: Normal breath sounds bilaterally, no wheezing, rales,rhonchi or crepitation. No use of accessory muscles of respiration.  CARDIOVASCULAR: S1, S2 normal. No murmurs,  rubs, or gallops.  ABDOMEN: Soft, nontender, nondistended. Bowel sounds present. No organomegaly or mass.  EXTREMITIES: No pedal edema, cyanosis, or clubbing.  NEUROLOGIC: Cranial nerves II through XII are intact. Muscle strength 5/5 in all extremities. Sensation intact. Gait not checked.  PSYCHIATRIC: The patient is alert and oriented x 3.  SKIN: No obvious rash, lesion, or ulcer.   Physical Exam LABORATORY PANEL:   CBC  Recent Labs Lab 04/19/16 0437  WBC 6.8  HGB 14.4  HCT 42.0  PLT 115*   ------------------------------------------------------------------------------------------------------------------  Chemistries   Recent Labs Lab 04/19/16 0437  NA 137  K 3.9  CL 112*  CO2 20*  GLUCOSE 200*  BUN 26*  CREATININE 1.09  CALCIUM 7.7*  AST 31  ALT 34  ALKPHOS 87  BILITOT 0.9   ------------------------------------------------------------------------------------------------------------------  Cardiac Enzymes  Recent Labs Lab 04/18/16 1011  TROPONINI <0.03   ------------------------------------------------------------------------------------------------------------------  RADIOLOGY:  X-ray Chest Pa And Lateral  04/19/2016  CLINICAL DATA:  Community acquired, diabetes mellitus, hypertension, prior MI EXAM: CHEST  2 VIEW COMPARISON:  04/18/2016 FINDINGS: LEFT subclavian AICD lead projects at RIGHT ventricle. Normal heart size, mediastinal contours, and pulmonary vascularity. Mild patchy BILATERAL mid lung infiltrates, slightly increased. Suspect mild infiltrate in medial RIGHT lower lobe. Remaining lungs clear. Tiny pleural effusions blunting the posterior costophrenic angles on lateral view. No pneumothorax. Bones unremarkable. IMPRESSION: Slightly increased pulmonary infiltrates. Electronically Signed   By: Lavonia Dana M.D.   On: 04/19/2016 08:59   Dg Chest 2 View  04/18/2016  CLINICAL DATA:  Pain for 1 day EXAM: CHEST  2 VIEW COMPARISON:  July 24, 2010 FINDINGS:  There is patchy infiltrate in the right upper lobe anteriorly. The lungs elsewhere are clear. Heart size and pulmonary vascularity are normal. No adenopathy. There is a pacemaker with lead tip attached to the right ventricle. No bone lesions. IMPRESSION: Patchy infiltrate consistent with pneumonia anterior segment right upper lobe. Lungs elsewhere clear. Stable cardiac silhouette. Pacemaker present with lead tip attached to right ventricle. Electronically Signed   By: Lowella Grip III M.D.   On: 04/18/2016 11:13   US Abdomen Limited Ruq  04/18/2016  CLINICAL DATA:  45 year old male with history of epigastric pain for 1 day. EXAM: US ABDOMEN LIMITED - RIGHT UPPER QUADRANT COMPARISON:  Abdominal ultrasound 07/24/2010. FINDINGS: Gallbladder: No gallstones or wall thickening visualized. No sonographic Murphy sign noted by sonographer. Common bile duct: Diameter: 8 mm Liver: No focal lesion identified. Mild intrahepatic biliary ductal dilatation. Within normal limits in parenchymal echogenicity. IMPRESSION: Mild intra and extrahepatic biliary ductal dilatation. This is of uncertain etiology and significance, but if there is clinical evidence to suggest biliary tract obstruction, differential considerations include the possibility of distal choledocholithiasis, distal ductal stricture or obstructing lesion such as a pancreatic head mass. Clinical correlation is recommended, with consideration for further evaluation with MRI of the abdomen with and without IV gadolinium with MRCP if clinically appropriate. Electronically Signed   By: Vinnie Langton M.D.   On: 04/18/2016 13:22    ASSESSMENT AND PLAN:   Principal Problem:   DKA (diabetic ketoacidoses) ([place]) Active Problems:   CAP (community acquired pneumonia)   Accelerated hypertension   Abdominal pain  * DKA   He did not took his insulin as prescribed for last 1-2 days.   Now resolved with insulin.   Counseled to stay compliant with his insurer and  doses.  * Pneumonia   Antibiotics, incentive spirometry.  * Uncontrolled hypertension   Continue oral home medications.  * Hyperlipidemia   Continue home medication.   All the records are reviewed and case discussed with Care Management/Social Workerr. Management plans discussed with the patient, family and they are in agreement.  CODE STATUS: Full code.  TOTAL TIME TAKING CARE OF THIS PATIENT: 35 minutes.   POSSIBLE D/C IN 1-2 DAYS, DEPENDING ON CLINICAL CONDITION.   [name] M.D on 04/19/2016   Between 7am to 6pm - Pager - [phone]  After 6pm go to [email or web address] - password [email or web address] [facility]  Office  [phone]  CC: Primary care physician; [facility]  Note: This dictation was prepared with Dragon dictation along with smaller phrase technology. Any transcriptional errors that result from this process are unintentional.

## 2016-04-19 NOTE — Progress Notes (Signed)
*  PRELIMINARY RESULTS* Echocardiogram 2D Echocardiogram has been performed.  Sherrie Sport 04/19/2016, 11:12 AM

## 2016-04-20 ENCOUNTER — Inpatient Hospital Stay: Payer: 59

## 2016-04-20 LAB — GLUCOSE, CAPILLARY
GLUCOSE-CAPILLARY: 121 mg/dL — AB (ref 65–99)
GLUCOSE-CAPILLARY: 220 mg/dL — AB (ref 65–99)
GLUCOSE-CAPILLARY: 87 mg/dL (ref 65–99)
Glucose-Capillary: 68 mg/dL (ref 65–99)
Glucose-Capillary: 76 mg/dL (ref 65–99)
Glucose-Capillary: 171 mg/dL — ABNORMAL HIGH (ref 65–99)
Glucose-Capillary: 195 mg/dL — ABNORMAL HIGH (ref 65–99)

## 2016-04-20 LAB — TROPONIN I: Troponin I: <0.03 ng/mL (ref <0.03)

## 2016-04-20 MED ORDER — PANTOPRAZOLE SODIUM 40 MG PO TBEC
40.0000 mg | DELAYED_RELEASE_TABLET | Freq: Every day | ORAL | Status: DC
  Administered 2016-04-20 – 2016-04-21 (×2): 40 mg via ORAL
  Filled 2016-04-20 (×2): qty 1

## 2016-04-20 MED ORDER — INSULIN DETEMIR 100 UNIT/ML ~~LOC~~ SOLN
13.0000 [IU] | Freq: Every day | SUBCUTANEOUS | Status: DC
  Administered 2016-04-20: 13 [IU] via SUBCUTANEOUS
  Filled 2016-04-20 (×2): qty 0.13

## 2016-04-20 MED ORDER — INSULIN ASPART 100 UNIT/ML ~~LOC~~ SOLN
5.0000 [IU] | Freq: Three times a day (TID) | SUBCUTANEOUS | Status: DC
  Administered 2016-04-21 (×2): 5 [IU] via SUBCUTANEOUS
  Filled 2016-04-20 (×2): qty 5

## 2016-04-20 MED ORDER — DEXTROSE 5 % IV SOLN
1.0000 g | INTRAVENOUS | Status: DC
  Administered 2016-04-20: 1 g via INTRAVENOUS
  Filled 2016-04-20 (×2): qty 10

## 2016-04-20 MED ORDER — INSULIN DETEMIR 100 UNIT/ML ~~LOC~~ SOLN
25.0000 [IU] | Freq: Every day | SUBCUTANEOUS | Status: DC
  Administered 2016-04-21: 25 [IU] via SUBCUTANEOUS
  Filled 2016-04-20: qty 0.25

## 2016-04-20 NOTE — Progress Notes (Signed)
[place] [facility] [place] NAME: Joseph [name]    MR#:  [id number]  DATE OF BIRTH:  1971-03-14  SUBJECTIVE:  CHIEF COMPLAINT:   Chief Complaint  Patient presents with  . Abdominal Pain  . Diarrhea  . Emesis      Came with vomiting and nausea, also had some cough for last few days.   Found to be in DKA, and chest x-ray was showing pneumonia.   DKA improved with Insulin, Tolerating diet. Had lower normal blood sugar level in the morning, so decreased to a long-acting insulin dose today.   Complaining of epigastric and lower chest pain.    REVIEW OF SYSTEMS:  CONSTITUTIONAL: No fever, fatigue or weakness.  EYES: No blurred or double vision.  EARS, NOSE, AND THROAT: No tinnitus or ear pain.  RESPIRATORY: Some cough, no shortness of breath, wheezing or hemoptysis.  CARDIOVASCULAR: No chest pain, orthopnea, edema.  GASTROINTESTINAL: he had nausea, vomiting, No diarrhea or abdominal pain.  GENITOURINARY: No dysuria, hematuria.  ENDOCRINE: No polyuria, nocturia,  HEMATOLOGY: No anemia, easy bruising or bleeding SKIN: No rash or lesion. MUSCULOSKELETAL: No joint pain or arthritis.   NEUROLOGIC: No tingling, numbness, weakness.  PSYCHIATRY: No anxiety or depression.   ROS  DRUG ALLERGIES:   Allergies  Allergen Reactions  . Vancomycin Shortness Of Breath  . Lisinopril Rash    VITALS:  Blood pressure 153/89, pulse 87, temperature 98.9 F (37.2 C), temperature source Oral, resp. rate 16, height 6\' 1"  (1.854 m), weight 97.387 kg (214 lb 11.2 oz), SpO2 97 %.  PHYSICAL EXAMINATION:  GENERAL:  45 y.o.-year-old patient lying in the bed with no acute distress.  EYES: Pupils equal, round, reactive to light and accommodation. No scleral icterus. Extraocular muscles intact.  HEENT: Head atraumatic, normocephalic. Oropharynx and nasopharynx clear.  NECK:  Supple, no jugular venous distention. No thyroid enlargement, no tenderness.  LUNGS: Normal breath  sounds bilaterally, no wheezing, rales,rhonchi or crepitation. No use of accessory muscles of respiration.  CARDIOVASCULAR: S1, S2 normal. No murmurs, rubs, or gallops.  ABDOMEN: Soft, nontender, nondistended. Bowel sounds present. No organomegaly or mass.  EXTREMITIES: No pedal edema, cyanosis, or clubbing.  NEUROLOGIC: Cranial nerves II through XII are intact. Muscle strength 5/5 in all extremities. Sensation intact. Gait not checked.  PSYCHIATRIC: The patient is alert and oriented x 3.  SKIN: No obvious rash, lesion, or ulcer.   Physical Exam LABORATORY PANEL:   CBC  Recent Labs Lab 04/19/16 0437  WBC 6.8  HGB 14.4  HCT 42.0  PLT 115*   ------------------------------------------------------------------------------------------------------------------  Chemistries   Recent Labs Lab 04/19/16 0437  NA 137  K 3.9  CL 112*  CO2 20*  GLUCOSE 200*  BUN 26*  CREATININE 1.09  CALCIUM 7.7*  AST 31  ALT 34  ALKPHOS 87  BILITOT 0.9   ------------------------------------------------------------------------------------------------------------------  Cardiac Enzymes  Recent Labs Lab 04/18/16 1011  TROPONINI <0.03   ------------------------------------------------------------------------------------------------------------------  RADIOLOGY:  Dg Chest 2 View  04/20/2016  CLINICAL DATA:  Pt came to the ER with sinus infection and has pneumonia. Hx of diabetes, hypertension, MI. He has a cardiac defibrillator. Non smoker. EXAM: CHEST  2 VIEW COMPARISON:  04/19/2016 and older exams. FINDINGS: Patchy bilateral airspace lung opacities are noted the right upper lobe, right middle lobe and both lower lobes. Opacity in the left mid lung, which is likely in the left lower lobe, has mildly increased the prior study as has right middle and lower  lobe opacity. Remainder of the lungs is clear. No pleural effusion or pneumothorax. There is opacity surrounding the inferior right hilum likely  combination of lung consolidation mild adenopathy. No mediastinal or hilar masses or evidence of mediastinal adenopathy. Heart is normal in size. Left anterior chest wall single lead pacemaker is stable and well positioned. IMPRESSION: 1. Mild worsening of multifocal pneumonia. Electronically Signed   By: Lajean Manes M.D.   On: 04/20/2016 11:29   X-ray Chest Pa And Lateral  04/19/2016  CLINICAL DATA:  Community acquired, diabetes mellitus, hypertension, prior MI EXAM: CHEST  2 VIEW COMPARISON:  04/18/2016 FINDINGS: LEFT subclavian AICD lead projects at RIGHT ventricle. Normal heart size, mediastinal contours, and pulmonary vascularity. Mild patchy BILATERAL mid lung infiltrates, slightly increased. Suspect mild infiltrate in medial RIGHT lower lobe. Remaining lungs clear. Tiny pleural effusions blunting the posterior costophrenic angles on lateral view. No pneumothorax. Bones unremarkable. IMPRESSION: Slightly increased pulmonary infiltrates. Electronically Signed   By: Lavonia Dana M.D.   On: 04/19/2016 08:59    ASSESSMENT AND PLAN:   Principal Problem:   DKA (diabetic ketoacidoses) ([place]) Active Problems:   CAP (community acquired pneumonia)   Accelerated hypertension   Abdominal pain  * DKA   He did not took his insulin as prescribed for last 1-2 days.   Now resolved with insulin.   Counseled to stay compliant with his insulin doses.   Mrs. long-acting insulin dose as blood sugar was running lower normal.  * Pneumonia   Antibiotics, incentive spirometry.   Complaining of some lower chest pain which may be because of pneumonia.  * Uncontrolled hypertension   Continue oral home medications.  * Hyperlipidemia   Continue home medication.  * Chest pain and epigastric pain   Chest x-ray again confirms pneumonia, Will follow troponin.   Likely gastritis- will give Oral protonix.   All the records are reviewed and case discussed with Care Management/Social Workerr. Management plans  discussed with the patient, family and they are in agreement.  CODE STATUS: Full code.  TOTAL TIME TAKING CARE OF THIS PATIENT: 35 minutes.   POSSIBLE D/C IN 1-2 DAYS, DEPENDING ON CLINICAL CONDITION.   [name] M.D on 04/20/2016   Between 7am to 6pm - Pager - [phone]  After 6pm go to [email or web address] - password [email or web address] [facility]  Office  [phone]  CC: Primary care physician; [facility]  Note: This dictation was prepared with Dragon dictation along with smaller phrase technology. Any transcriptional errors that result from this process are unintentional.

## 2016-04-20 NOTE — Progress Notes (Addendum)
Inpatient Diabetes Program Recommendations  AACE/ADA: New Consensus Statement on Inpatient Glycemic Control (2015)  Target Ranges:  Prepandial:   less than 140 mg/dL      Peak postprandial:   less than 180 mg/dL (1-2 hours)      Critically ill patients:  140 - 180 mg/dL   Lab Results  Component Value Date   GLUCAP 121* 04/20/2016    Review of Glycemic Control  Results for AYUB, ROUPE (MRN [id number]) as of 04/20/2016 07:58  Ref. Range 04/19/2016 11:28 04/19/2016 16:51 04/19/2016 20:48 04/20/2016 00:11 04/20/2016 05:33 04/20/2016 06:18 04/20/2016 07:54  Glucose-Capillary Latest Ref Range: 65-99 mg/dL 207 (H) 154 (H) 127 (H) 87 68 76 121 (H)    Diabetes history: Type 2 Outpatient Diabetes medications: Levemir 30 units qam, 13 units qpm, Humalog pre-meals 16 units with breakfast, 18 units with lunch and 20 units with supper, Novolog sliding scale Current orders for Inpatient glycemic control: Levemir 25 units bid, Novolog 0-15 units q4h  Inpatient Diabetes Program Recommendations:  Low blood sugar this am.   Please decrease 2200 Levemir insulin to 13 units.  Continue Levemir 25 units qam.     Consider adding Novolog 5 units tid with meals (hold if he eats less than 50 %).   Please consider changing Novolog correction insulin (from q4h) to moderate scale tid and add Novolog 0-5 units qhs.   Gentry Fitz, RN, BA, MHA, CDE Diabetes Coordinator Inpatient Diabetes Program  [phone] (Team Pager) [phone] ([place]) 04/20/2016 8:07 AM

## 2016-04-20 NOTE — Progress Notes (Signed)
Dr. Anselm Jungling was notified of pt having pink/brown tinged sputum and pt is complaining of his abdomen feeling full and is hurting him. New orders for pt to have a routine chest x-ray 2 view. Also pt has 25 units of levemir due at this time with a CBG of 121, new orders to hold levemir at this time. Will continue to monitor pt.  Angus Seller

## 2016-04-20 NOTE — Progress Notes (Signed)
Pts CGB was 68 - gave 4oz of apple juice (pt preference) and rechecked. CBG now 76.

## 2016-04-21 LAB — GLUCOSE, CAPILLARY
Glucose-Capillary: 159 mg/dL — ABNORMAL HIGH (ref 65–99)
Glucose-Capillary: 162 mg/dL — ABNORMAL HIGH (ref 65–99)
Glucose-Capillary: 190 mg/dL — ABNORMAL HIGH (ref 65–99)
Glucose-Capillary: 204 mg/dL — ABNORMAL HIGH (ref 65–99)
Glucose-Capillary: 212 mg/dL — ABNORMAL HIGH (ref 65–99)

## 2016-04-21 MED ORDER — AMLODIPINE BESYLATE 5 MG PO TABS: 5.0000 mg | ORAL_TABLET | Freq: Every day | ORAL | Status: DC

## 2016-04-21 MED ORDER — LORATADINE 10 MG PO TABS: 10.0000 mg | ORAL_TABLET | Freq: Every day | ORAL | Status: DC

## 2016-04-21 MED ORDER — AMOXICILLIN-POT CLAVULANATE 875-125 MG PO TABS: 1.0000 | ORAL_TABLET | Freq: Two times a day (BID) | ORAL | Status: DC

## 2016-04-21 MED ORDER — LORATADINE 10 MG PO TABS
10.0000 mg | ORAL_TABLET | Freq: Every day | ORAL | Status: DC
  Administered 2016-04-21: 10 mg via ORAL
  Filled 2016-04-21 (×2): qty 1

## 2016-04-21 MED ORDER — PANTOPRAZOLE SODIUM 40 MG PO TBEC: 40.0000 mg | DELAYED_RELEASE_TABLET | Freq: Every day | ORAL | Status: DC

## 2016-04-21 MED ORDER — SALINE SPRAY 0.65 % NA SOLN
1.0000 | NASAL | Status: DC | PRN
  Administered 2016-04-21: 1 via NASAL
  Filled 2016-04-21: qty 44

## 2016-04-21 MED ORDER — SALINE SPRAY 0.65 % NA SOLN: 1.0000 | NASAL | Status: DC | PRN

## 2016-04-21 MED ORDER — AMLODIPINE BESYLATE 5 MG PO TABS
5.0000 mg | ORAL_TABLET | Freq: Every day | ORAL | Status: DC
  Administered 2016-04-21: 5 mg via ORAL
  Filled 2016-04-21: qty 1

## 2016-04-21 NOTE — Progress Notes (Signed)
[place], [place].   04/21/2016  Patient: Joseph [name]   Date of Birth:  [birth year]  Date of admission:  04/18/2016  Date of Discharge  04/21/2016    To Whom it May Concern:   Waleed Naifeh  may return to work on 04/24/16.  PHYSICAL ACTIVITY:  Full  If you have any questions or concerns, please don't hesitate to call.  Sincerely,   [name] M.D Pager [phone] Office : [phone]   .

## 2016-04-21 NOTE — Discharge Summary (Signed)
[place] at [place] NAME: Joseph [name]    MR#:  [id number]  DATE OF BIRTH:  [birth year]  DATE OF ADMISSION:  04/18/2016 ADMITTING PHYSICIAN: Idelle Crouch, MD  DATE OF DISCHARGE: 04/21/2016  PRIMARY CARE PHYSICIAN: [place]    ADMISSION DIAGNOSIS:  Epigastric pain [R10.13] Diabetic ketoacidosis without coma associated with type 1 diabetes mellitus ([place]) [E10.10]  DISCHARGE DIAGNOSIS:  Principal Problem:   DKA (diabetic ketoacidoses) ([place]) Active Problems:   CAP (community acquired pneumonia)   Accelerated hypertension   Abdominal pain   SECONDARY DIAGNOSIS:   Past Medical History  Diagnosis Date  . Diabetes mellitus without complication ([place])   . Hypertension   . Hyperlipidemia   . Myocardial infarction [facility])     HOSPITAL COURSE:   * DKA  He did not took his insulin as prescribed for last 1-2 days.  Now resolved with insulin.  Counseled to stay compliant with his insulin doses.  back to his baseline dose, as now discharge.   He have a gluco meter and generally his blood sugars at home are under control, with his home regimen.  * Pneumonia  Antibiotics, incentive spirometry.  Complaining of some lower chest pain which may be because of pneumonia.   Giving augmentin on discharge.  * Uncontrolled hypertension  Continue oral home medications.   Added amlodipin.  * Hyperlipidemia  Continue home medication.  * Chest pain and epigastric pain  Chest x-ray again confirms pneumonia, negative serial troponin.  Likely gastritis- will give Oral protonix.   Pain resolved now.  DISCHARGE CONDITIONS:   Stable.  CONSULTS OBTAINED:     DRUG ALLERGIES:   Allergies  Allergen Reactions  . Vancomycin Shortness Of Breath  . Lisinopril Rash    DISCHARGE MEDICATIONS:   Current Discharge Medication List    START taking these medications   Details  amLODipine (NORVASC) 5 MG tablet  Take 1 tablet (5 mg total) by mouth daily. Qty: 30 tablet, Refills: 0    amoxicillin-clavulanate (AUGMENTIN) 875-125 MG tablet Take 1 tablet by mouth 2 (two) times daily. Qty: 10 tablet, Refills: 0    loratadine (CLARITIN) 10 MG tablet Take 1 tablet (10 mg total) by mouth daily. Qty: 5 tablet, Refills: 0    pantoprazole (PROTONIX) 40 MG tablet Take 1 tablet (40 mg total) by mouth daily. Qty: 30 tablet, Refills: 0    sodium chloride (OCEAN) 0.65 % SOLN nasal spray Place 1 spray into both nostrils as needed for congestion. Qty: 30 mL, Refills: 0      CONTINUE these medications which have NOT CHANGED   Details  aspirin EC 81 MG tablet Take 81 mg by mouth every morning.    atorvastatin (LIPITOR) 40 MG tablet Take 40 mg by mouth at bedtime.    carvedilol (COREG) 12.5 MG tablet Take 12.5 mg by mouth 2 (two) times daily.    Cholecalciferol (VITAMIN D3) 2000 units capsule Take 2,000 Units by mouth daily.    digoxin (LANOXIN) 0.125 MG tablet Take 125 mcg by mouth daily.    fluticasone (FLONASE) 50 MCG/ACT nasal spray Place 2 sprays into both nostrils daily as needed. For allergies/rhinitis.    furosemide (LASIX) 20 MG tablet Take 20 mg by mouth daily.    HUMALOG KWIKPEN 100 UNIT/ML KiwkPen Inject 16-20 Units into the skin 3 (three) times daily before meals. 16 units subcutaneous in the morning, 18 units subcutaneous at lunchtime, and 20 units subcutaneous at night.  Plus sliding scale as directed.    Insulin Detemir (LEVEMIR FLEXTOUCH) 100 UNIT/ML Pen Inject 13-30 Units into the skin See admin instructions. Inject 30 units subcutaneous in the morning, and Inject 13 units subcutaneous every night.    losartan (COZAAR) 100 MG tablet Take 100 mg by mouth at bedtime.    metoCLOPramide (REGLAN) 10 MG tablet Take 10 mg by mouth at bedtime.    potassium chloride (MICRO-K) 10 MEQ CR capsule Take 10 mEq by mouth daily.         DISCHARGE INSTRUCTIONS:    Follow with PMD in 2 weeks.  If  you experience worsening of your admission symptoms, develop shortness of breath, life threatening emergency, suicidal or homicidal thoughts you must seek medical attention immediately by calling 911 or calling your MD immediately  if symptoms less severe.  You Must read complete instructions/literature along with all the possible adverse reactions/side effects for all the Medicines you take and that have been prescribed to you. Take any new Medicines after you have completely understood and accept all the possible adverse reactions/side effects.   Please note  You were cared for by a hospitalist during your hospital stay. If you have any questions about your discharge medications or the care you received while you were in the hospital after you are discharged, you can call the unit and asked to speak with the hospitalist on call if the hospitalist that took care of you is not available. Once you are discharged, your primary care physician will handle any further medical issues. Please note that NO REFILLS for any discharge medications will be authorized once you are discharged, as it is imperative that you return to your primary care physician (or establish a relationship with a primary care physician if you do not have one) for your aftercare needs so that they can reassess your need for medications and monitor your lab values.    Today   CHIEF COMPLAINT:   Chief Complaint  Patient presents with  . Abdominal Pain  . Diarrhea  . Emesis    HISTORY OF PRESENT ILLNESS:  Joseph [name]  is a 45 y.o. male with a known history of Dm and HTn who presents to the ER with epigatric and RUQ abdominal pain with N/V/D. Has had a recent sinus infection. Has not been taking his insulin as prescribed. In the ER, the patient's blood sugars with >400 with an anion gap noted. CXR reveals pneumonia. BP is extremely high. He is now admitted. Denies Cp or SOB.   VITAL SIGNS:  Blood pressure 158/88, pulse 87,  temperature 99.2 F (37.3 C), temperature source Oral, resp. rate 20, height 6\' 1"  (1.854 m), weight 97.387 kg (214 lb 11.2 oz), SpO2 98 %.  I/O:   Intake/Output Summary (Last 24 hours) at 04/21/16 1102 Last data filed at 04/21/16 0557  Gross per 24 hour  Intake    428 ml  Output    325 ml  Net    103 ml    PHYSICAL EXAMINATION:   GENERAL: 45 y.o.-year-old patient lying in the bed with no acute distress.  EYES: Pupils equal, round, reactive to light and accommodation. No scleral icterus. Extraocular muscles intact.  HEENT: Head atraumatic, normocephalic. Oropharynx and nasopharynx clear.  NECK: Supple, no jugular venous distention. No thyroid enlargement, no tenderness.  LUNGS: Normal breath sounds bilaterally, no wheezing, rales,rhonchi or crepitation. No use of accessory muscles of respiration.  CARDIOVASCULAR: S1, S2 normal. No murmurs, rubs, or gallops.  ABDOMEN:  Soft, nontender, nondistended. Bowel sounds present. No organomegaly or mass.  EXTREMITIES: No pedal edema, cyanosis, or clubbing.  NEUROLOGIC: Cranial nerves II through XII are intact. Muscle strength 5/5 in all extremities. Sensation intact. Gait not checked.  PSYCHIATRIC: The patient is alert and oriented x 3.  SKIN: No obvious rash, lesion, or ulcer.  DATA REVIEW:   CBC  Recent Labs Lab 04/19/16 0437  WBC 6.8  HGB 14.4  HCT 42.0  PLT 115*    Chemistries   Recent Labs Lab 04/19/16 0437  NA 137  K 3.9  CL 112*  CO2 20*  GLUCOSE 200*  BUN 26*  CREATININE 1.09  CALCIUM 7.7*  AST 31  ALT 34  ALKPHOS 87  BILITOT 0.9    Cardiac Enzymes  Recent Labs Lab 04/20/16 2053  TROPONINI <0.03    Microbiology Results  Results for orders placed or performed in visit on 11/19/11  Influenza A&B Antigens [facility])     Status: None   Collection Time: 11/19/11  2:46 PM  Result Value Ref Range Status   Micro Text Report   Final       SOURCE: SWAB    COMMENT                   NEGATIVE FOR  INFLUENZA A AND B   ANTIBIOTIC                                                        RADIOLOGY:  Dg Chest 2 View  04/20/2016  CLINICAL DATA:  Pt came to the ER with sinus infection and has pneumonia. Hx of diabetes, hypertension, MI. He has a cardiac defibrillator. Non smoker. EXAM: CHEST  2 VIEW COMPARISON:  04/19/2016 and older exams. FINDINGS: Patchy bilateral airspace lung opacities are noted the right upper lobe, right middle lobe and both lower lobes. Opacity in the left mid lung, which is likely in the left lower lobe, has mildly increased the prior study as has right middle and lower lobe opacity. Remainder of the lungs is clear. No pleural effusion or pneumothorax. There is opacity surrounding the inferior right hilum likely combination of lung consolidation mild adenopathy. No mediastinal or hilar masses or evidence of mediastinal adenopathy. Heart is normal in size. Left anterior chest wall single lead pacemaker is stable and well positioned. IMPRESSION: 1. Mild worsening of multifocal pneumonia. Electronically Signed   By: Lajean Manes M.D.   On: 04/20/2016 11:29    EKG:   Orders placed or performed during the hospital encounter of 04/18/16  . ED EKG  . ED EKG  . EKG 12-Lead  . EKG 12-Lead      Management plans discussed with the patient, family and they are in agreement.  CODE STATUS:     Code Status Orders        Start     Ordered   04/18/16 1421  Full code   Continuous     04/18/16 1420    Code Status History    Date Active Date Inactive Code Status Order ID Comments User Context   This patient has a current code status but no historical code status.      TOTAL TIME TAKING CARE OF THIS PATIENT: 35 minutes.    [name] M.D on 04/21/2016 at 11:02 AM  Between 7am to 6pm - Pager - [phone]  After 6pm go to [email or web address] - password [email or web address] [facility]  Office  [phone]  CC: Primary care physician; [facility]   Note: This dictation was prepared with Dragon dictation along with smaller phrase technology. Any transcriptional errors that result from this process are unintentional.

## 2016-10-11 DIAGNOSIS — G4733 Obstructive sleep apnea (adult) (pediatric): Secondary | ICD-10-CM

## 2016-10-11 HISTORY — DX: Obstructive sleep apnea (adult) (pediatric): G47.33

## 2016-11-23 ENCOUNTER — Encounter: Payer: Self-pay | Admitting: Emergency Medicine

## 2016-11-23 ENCOUNTER — Inpatient Hospital Stay
Admission: EM | Admit: 2016-11-23 | Discharge: 2016-11-25 | DRG: 287 | Disposition: A | Discharge status: Home / Self Care | Payer: 59 | Attending: Internal Medicine | Admitting: Internal Medicine

## 2016-11-23 ENCOUNTER — Encounter
Admission: EM | Disposition: A | Discharge status: Home / Self Care | Payer: Self-pay | Source: Home / Self Care | Attending: Internal Medicine

## 2016-11-23 DIAGNOSIS — R0902 Hypoxemia: Secondary | ICD-10-CM | POA: Diagnosis present

## 2016-11-23 DIAGNOSIS — I5022 Chronic systolic (congestive) heart failure: Secondary | ICD-10-CM | POA: Diagnosis present

## 2016-11-23 DIAGNOSIS — I2510 Atherosclerotic heart disease of native coronary artery without angina pectoris: Secondary | ICD-10-CM | POA: Diagnosis present

## 2016-11-23 DIAGNOSIS — Z888 Allergy status to other drugs, medicaments and biological substances status: Secondary | ICD-10-CM

## 2016-11-23 DIAGNOSIS — I252 Old myocardial infarction: Secondary | ICD-10-CM

## 2016-11-23 DIAGNOSIS — R0602 Shortness of breath: Secondary | ICD-10-CM

## 2016-11-23 DIAGNOSIS — I5023 Acute on chronic systolic (congestive) heart failure: Secondary | ICD-10-CM | POA: Diagnosis present

## 2016-11-23 DIAGNOSIS — T82855A Stenosis of coronary artery stent, initial encounter: Secondary | ICD-10-CM | POA: Diagnosis present

## 2016-11-23 DIAGNOSIS — I213 ST elevation (STEMI) myocardial infarction of unspecified site: Secondary | ICD-10-CM

## 2016-11-23 DIAGNOSIS — Z794 Long term (current) use of insulin: Secondary | ICD-10-CM

## 2016-11-23 DIAGNOSIS — Z7951 Long term (current) use of inhaled steroids: Secondary | ICD-10-CM

## 2016-11-23 DIAGNOSIS — E119 Type 2 diabetes mellitus without complications: Secondary | ICD-10-CM | POA: Diagnosis present

## 2016-11-23 DIAGNOSIS — E785 Hyperlipidemia, unspecified: Secondary | ICD-10-CM | POA: Diagnosis present

## 2016-11-23 DIAGNOSIS — R9431 Abnormal electrocardiogram [ECG] [EKG]: Secondary | ICD-10-CM

## 2016-11-23 DIAGNOSIS — I161 Hypertensive emergency: Secondary | ICD-10-CM | POA: Diagnosis present

## 2016-11-23 DIAGNOSIS — Z7982 Long term (current) use of aspirin: Secondary | ICD-10-CM

## 2016-11-23 DIAGNOSIS — Z9889 Other specified postprocedural states: Secondary | ICD-10-CM

## 2016-11-23 DIAGNOSIS — R000 Tachycardia, unspecified: Secondary | ICD-10-CM | POA: Diagnosis present

## 2016-11-23 DIAGNOSIS — Z8249 Family history of ischemic heart disease and other diseases of the circulatory system: Secondary | ICD-10-CM

## 2016-11-23 DIAGNOSIS — Z79899 Other long term (current) drug therapy: Secondary | ICD-10-CM

## 2016-11-23 DIAGNOSIS — I4510 Unspecified right bundle-branch block: Secondary | ICD-10-CM | POA: Diagnosis present

## 2016-11-23 DIAGNOSIS — I110 Hypertensive heart disease with heart failure: Principal | ICD-10-CM | POA: Diagnosis present

## 2016-11-23 DIAGNOSIS — Z95810 Presence of automatic (implantable) cardiac defibrillator: Secondary | ICD-10-CM

## 2016-11-23 DIAGNOSIS — I255 Ischemic cardiomyopathy: Secondary | ICD-10-CM | POA: Diagnosis present

## 2016-11-23 HISTORY — PX: LEFT HEART CATH AND CORONARY ANGIOGRAPHY: CATH118249

## 2016-11-23 HISTORY — DX: Ischemic cardiomyopathy: I25.5

## 2016-11-23 HISTORY — DX: Unspecified systolic (congestive) heart failure: I50.20

## 2016-11-23 LAB — DIFFERENTIAL
BASOS ABS: 0 10*3/uL (ref 0–0.1)
Basophils Relative: 0 %
Eosinophils Absolute: 0.6 10*3/uL (ref 0–0.7)
Eosinophils Relative: 6 %
LYMPHS ABS: 3.3 10*3/uL (ref 1.0–3.6)
Lymphocytes Relative: 33 %
MONOS PCT: 11 %
Monocytes Absolute: 1.1 10*3/uL — ABNORMAL HIGH (ref 0.2–1.0)
NEUTROS ABS: 5.1 10*3/uL (ref 1.4–6.5)
Neutrophils Relative %: 50 %

## 2016-11-23 LAB — CBC
HEMATOCRIT: 45.2 % (ref 40.0–52.0)
HEMOGLOBIN: 15.3 g/dL (ref 13.0–18.0)
MCH: 30.3 pg (ref 26.0–34.0)
MCHC: 33.9 g/dL (ref 32.0–36.0)
MCV: 89.3 fL (ref 80.0–100.0)
Platelets: 150 10*3/uL (ref 150–440)
RBC: 5.06 MIL/uL (ref 4.40–5.90)
RDW: 13.3 % (ref 11.5–14.5)
WBC: 10.1 10*3/uL (ref 3.8–10.6)

## 2016-11-23 LAB — PROTIME-INR
INR: 1
Prothrombin Time: 13.2 seconds (ref 11.4–15.2)

## 2016-11-23 LAB — APTT: APTT: 26 s (ref 24–36)

## 2016-11-23 SURGERY — LEFT HEART CATH AND CORONARY ANGIOGRAPHY

## 2016-11-23 MED ORDER — HEPARIN SODIUM (PORCINE) 1000 UNIT/ML IJ SOLN
INTRAMUSCULAR | Status: AC
  Filled 2016-11-23: qty 1

## 2016-11-23 MED ORDER — MIDAZOLAM HCL 2 MG/2ML IJ SOLN
INTRAMUSCULAR | Status: DC | PRN
  Administered 2016-11-23: 1 mg via INTRAVENOUS

## 2016-11-23 MED ORDER — SODIUM CHLORIDE 0.9 % IV SOLN
10.0000 mL/h | INTRAVENOUS | Status: DC
  Administered 2016-11-23: 20 mL/h via INTRAVENOUS

## 2016-11-23 MED ORDER — FENTANYL CITRATE (PF) 100 MCG/2ML IJ SOLN
INTRAMUSCULAR | Status: AC
  Filled 2016-11-23: qty 2

## 2016-11-23 MED ORDER — NITROGLYCERIN 5 MG/ML IV SOLN
INTRAVENOUS | Status: AC
  Filled 2016-11-23: qty 10

## 2016-11-23 MED ORDER — VERAPAMIL HCL 2.5 MG/ML IV SOLN
INTRAVENOUS | Status: DC | PRN
  Administered 2016-11-23: 2.5 mg via INTRA_ARTERIAL

## 2016-11-23 MED ORDER — HEPARIN SODIUM (PORCINE) 5000 UNIT/ML IJ SOLN
4000.0000 [IU] | Freq: Once | INTRAMUSCULAR | Status: AC
  Administered 2016-11-23: 4000 [IU] via INTRAVENOUS

## 2016-11-23 MED ORDER — FUROSEMIDE 10 MG/ML IJ SOLN
INTRAMUSCULAR | Status: DC | PRN
  Administered 2016-11-23: 40 mg via INTRAVENOUS

## 2016-11-23 MED ORDER — VERAPAMIL HCL 2.5 MG/ML IV SOLN
INTRAVENOUS | Status: AC
  Filled 2016-11-23: qty 2

## 2016-11-23 MED ORDER — FENTANYL CITRATE (PF) 100 MCG/2ML IJ SOLN
INTRAMUSCULAR | Status: DC | PRN
  Administered 2016-11-23: 50 ug via INTRAVENOUS

## 2016-11-23 MED ORDER — HEPARIN (PORCINE) IN NACL 100-0.45 UNIT/ML-% IJ SOLN
1150.0000 [IU]/h | INTRAMUSCULAR | Status: DC
  Filled 2016-11-23: qty 250

## 2016-11-23 MED ORDER — MIDAZOLAM HCL 2 MG/2ML IJ SOLN
INTRAMUSCULAR | Status: AC
  Filled 2016-11-23: qty 2

## 2016-11-23 MED ORDER — FUROSEMIDE 10 MG/ML IJ SOLN
INTRAMUSCULAR | Status: AC
  Filled 2016-11-23: qty 4

## 2016-11-23 SURGICAL SUPPLY — 8 items
CATH 5FR JR4 DIAGNOSTIC (CATHETERS) ×2 IMPLANT
CATH 5FR PIGTAIL DIAGNOSTIC (CATHETERS) ×2 IMPLANT
CATH VISTA GUIDE 6FR XB3.5 (CATHETERS) ×2 IMPLANT
DEVICE INFLAT 30 PLUS (MISCELLANEOUS) ×2 IMPLANT
DEVICE RAD TR BAND REGULAR (VASCULAR PRODUCTS) ×2 IMPLANT
KIT MANI 3VAL PERCEP (MISCELLANEOUS) ×2 IMPLANT
PACK CARDIAC CATH (CUSTOM PROCEDURE TRAY) ×2 IMPLANT
WIRE HITORQ VERSACORE ST 145CM (WIRE) ×2 IMPLANT

## 2016-11-23 NOTE — Progress Notes (Signed)
ANTICOAGULATION CONSULT NOTE - Initial Consult  Pharmacy Consult for heparin Indication: chest pain/ACS  Allergies  Allergen Reactions  . Vancomycin Shortness Of Breath  . Lisinopril Rash    Patient Measurements: Height: 6\' 1"  (185.4 cm) Weight: 214 lb (97.1 kg) IBW/kg (Calculated) : 79.9 Heparin Dosing Weight: 97.1 kg  Vital Signs: BP: 179/112 (02/13 2335) Pulse Rate: 107 (02/13 2335)  Labs: No results for input(s): HGB, HCT, PLT, APTT, LABPROT, INR, HEPARINUNFRC, HEPRLOWMOCWT, CREATININE, CKTOTAL, CKMB, TROPONINI in the last 72 hours.  CrCl cannot be calculated (Patient's most recent lab result is older than the maximum 21 days allowed.).   Medical History: Past Medical History:  Diagnosis Date  . Diabetes mellitus without complication ([place])   . Hyperlipidemia   . Hypertension   . Myocardial infarction     Medications:  Infusions:  . sodium chloride    . [START ON 11/24/2016] heparin      Assessment: 89 yom via EMS for code STEMI. Pharmacy consulted to dose UFH for ACS. No PTA OAC noted.  Goal of Therapy:  Heparin level 0.3-0.7 units/ml Monitor platelets by anticoagulation protocol: Yes   Plan:  Give 4000 units bolus x 1 Start heparin infusion at 1150 units/hr Check anti-Xa level in 6 hours and daily while on heparin Continue to monitor H&H and platelets  Laural Benes, Pharm.D., BCPS Clinical Pharmacist 11/23/2016,11:48 PM

## 2016-11-23 NOTE — ED Triage Notes (Addendum)
Pt arrived via ems with complaints of shortness of breath. EMS activated CODE stemi in field after EKG abnormalities. Pt a&o x 4. Pt on CPAP upon arrival. EMS gave 324 mg of aspirin, 2 x 1" NTG paste in route

## 2016-11-23 NOTE — ED Notes (Signed)
Pt transferred to cath lab.

## 2016-11-23 NOTE — ED Provider Notes (Signed)
[facility] Emergency Department Provider Note   ____________________________________________   First MD Initiated Contact with Patient 11/23/16 2330     (approximate)  I have reviewed the triage vital signs and the nursing notes.   HISTORY  Chief Complaint Shortness of Breath  The patient's history is limited by the patient's respiratory distress  HPI Joseph [name] is a 46 y.o. male who called EMS tonight with some difficulty breathing. He reports that he started having some difficulty breathing this evening. He was coughing up some clear fluid. He contacted EMS who reports that his initial blood pressure was in the 180s over 1:15. They did an EKG and they saw some ST elevation in lead V3 and V4. According to EMS the patient does not have a history of heart failure but he has had an MI in the past. The patient reports though that he does have heart failure but cannot remember his medications. EMS placed the patient on CPAP and gave him some aspirin and Nitropaste. The patient denies having any chest pain or chest pressure. He had no nausea or vomiting but he was diaphoretic. He reports that his initial heart attack was in 2011 where he was nauseous as his sign. The patient otherwise had been feeling well. He denies not taking any of his medications. He has never had anything like this before.   Past Medical History:  Diagnosis Date  . Diabetes mellitus without complication ([place])   . Hyperlipidemia   . Hypertension   . Myocardial infarction     Patient Active Problem List   Diagnosis Date Noted  . DKA (diabetic ketoacidoses) ([place]) 04/18/2016  . CAP (community acquired pneumonia) 04/18/2016  . Accelerated hypertension 04/18/2016  . Abdominal pain 04/18/2016    Past Surgical History:  Procedure Laterality Date  . CARDIAC DEFIBRILLATOR PLACEMENT    . CARDIAC SURGERY      Prior to Admission medications   Medication Sig Start Date End Date Taking?  Authorizing Provider  amLODipine (NORVASC) 5 MG tablet Take 1 tablet (5 mg total) by mouth daily. 04/21/16   [name], MD  amoxicillin-clavulanate (AUGMENTIN) 875-125 MG tablet Take 1 tablet by mouth 2 (two) times daily. 04/21/16   [name], MD  aspirin EC 81 MG tablet Take 81 mg by mouth every morning.    Historical Provider, MD  atorvastatin (LIPITOR) 40 MG tablet Take 40 mg by mouth at bedtime. 02/09/16   Historical Provider, MD  carvedilol (COREG) 12.5 MG tablet Take 12.5 mg by mouth 2 (two) times daily. 02/16/16   Historical Provider, MD  Cholecalciferol (VITAMIN D3) 2000 units capsule Take 2,000 Units by mouth daily. 09/15/11   Historical Provider, MD  digoxin (LANOXIN) 0.125 MG tablet Take 125 mcg by mouth daily. 02/16/16   Historical Provider, MD  fluticasone (FLONASE) 50 MCG/ACT nasal spray Place 2 sprays into both nostrils daily as needed. For allergies/rhinitis. 01/26/16   Historical Provider, MD  furosemide (LASIX) 20 MG tablet Take 20 mg by mouth daily. 02/09/16   Historical Provider, MD  HUMALOG KWIKPEN 100 UNIT/ML KiwkPen Inject 16-20 Units into the skin 3 (three) times daily before meals. 16 units subcutaneous in the morning, 18 units subcutaneous at lunchtime, and 20 units subcutaneous at night. Plus sliding scale as directed. 03/09/16   Historical Provider, MD  Insulin Detemir (LEVEMIR FLEXTOUCH) 100 UNIT/ML Pen Inject 13-30 Units into the skin See admin instructions. Inject 30 units subcutaneous in the morning, and Inject 13 units subcutaneous every night.  09/17/15   Historical Provider, MD  loratadine (CLARITIN) 10 MG tablet Take 1 tablet (10 mg total) by mouth daily. 04/21/16   [name], MD  losartan (COZAAR) 100 MG tablet Take 100 mg by mouth at bedtime.    Historical Provider, MD  metoCLOPramide (REGLAN) 10 MG tablet Take 10 mg by mouth at bedtime. 12/15/11   Historical Provider, MD  pantoprazole (PROTONIX) 40 MG tablet Take 1 tablet (40 mg total) by mouth  daily. 04/21/16   [name], MD  potassium chloride (MICRO-K) 10 MEQ CR capsule Take 10 mEq by mouth daily. 02/23/16   Historical Provider, MD  sodium chloride (OCEAN) 0.65 % SOLN nasal spray Place 1 spray into both nostrils as needed for congestion. 04/21/16   [name], MD    Allergies Vancomycin and Lisinopril  No family history on file.  Social History Social History  Substance Use Topics  . Smoking status: Never Smoker  . Smokeless tobacco: Not on file  . Alcohol use No    Review of Systems Constitutional: sweats Eyes: No visual changes. ENT: No sore throat. Cardiovascular: Denies chest pain. Respiratory:cough and shortness of breath. Gastrointestinal: No abdominal pain.  No nausea, no vomiting.  No diarrhea.  No constipation. Genitourinary: Negative for dysuria. Musculoskeletal: Negative for back pain. Skin: Negative for rash. Neurological: Negative for headaches, focal weakness or numbness.  10-point ROS otherwise negative.  ____________________________________________   PHYSICAL EXAM:  VITAL SIGNS: ED Triage Vitals  Enc Vitals Group     BP 11/23/16 2335 (!) 179/112     Pulse Rate 11/23/16 2335 (!) 107     Resp 11/23/16 2335 (!) 25     Temp --      Temp src --      SpO2 11/23/16 2335 100 %     Weight 11/23/16 2332 214 lb (97.1 kg)     Height 11/23/16 2332 6\' 1"  (1.854 m)     Head Circumference --      Peak Flow --      Pain Score --      Pain Loc --      Pain Edu? --      Excl. in [place]? --     Constitutional: Alert and oriented. Well appearing and in moderate respiratory distress Eyes: Conjunctivae are normal. PERRL. EOMI. Head: Atraumatic. Nose: No congestion/rhinnorhea. Mouth/Throat: Mucous membranes are moist.  Oropharynx non-erythematous. Cardiovascular: Normal rate, regular rhythm. Grossly normal heart sounds.  Good peripheral circulation. Respiratory: increased respiratory effort.  No retractions. Coarse expiratory breath  sounds Gastrointestinal: Soft and nontender. No distention. Positive bowel sounds Musculoskeletal: No lower extremity tenderness nor edema.  Neurologic:  Normal speech and language.  Skin:  Skin is warm, dry and intact.  Psychiatric: Mood and affect are normal.   ____________________________________________   LABS (all labs ordered are listed, but only abnormal results are displayed)  Labs Reviewed  DIFFERENTIAL - Abnormal; Notable for the following:       Result Value   Monocytes Absolute 1.1 (*)    All other components within normal limits  CBC  PROTIME-INR  APTT  COMPREHENSIVE METABOLIC PANEL  TROPONIN I  LIPID PANEL  HEPARIN LEVEL (UNFRACTIONATED)   ____________________________________________  EKG  ED ECG REPORT I, Loney Hering, the attending physician, personally viewed and interpreted this ECG.   Date: 11/23/2016  EKG Time: 2334  Rate: 107  Rhythm: sinus tachycardia  Axis: left axis deviation  Intervals:none  ST&T Change: ST segment elevation in lead V3 and  V4  ED ECG REPORT #2 I, Loney Hering, the attending physician, personally viewed and interpreted this ECG.   Date: 11/23/2016  EKG Time: 2338  Rate: 107  Rhythm: sinus tachycardia  Axis: Left axis deviation   Intervals:none  ST&T Change: ST segment elevation in lead V3 and V4 with Q waves in lead V2   ____________________________________________  RADIOLOGY  none ____________________________________________   PROCEDURES  Procedure(s) performed: None  Procedures  Critical Care performed: No  ____________________________________________   INITIAL IMPRESSION / ASSESSMENT AND PLAN / ED COURSE  Pertinent labs & imaging results that were available during my care of the patient were reviewed by me and considered in my medical decision making (see chart for details).  This is a 46 year old male who comes into the hospital today with an ST segment elevation MI. The paramedics  in the field called this code STEMI. The cardiologist Dr. Saunders Revel was in the emergency department when the patient arrived. The patient was on CPAP and was having some difficulty breathing but he reports that it was better with the CPAP. The patient reports he had had a heart attack before that did not present with chest pain but with nausea and feeling sick. After obtaining an EKG Dr. Saunders Revel decided that he would take the patient to the Cath Lab. The patient had received aspirin as well as nitroglycerin. We did give him 4000 units of heparin and the patient will be taken to the Cath Lab.      ____________________________________________   FINAL CLINICAL IMPRESSION(S) / ED DIAGNOSES  Final diagnoses:  Shortness of breath  ST elevation myocardial infarction (STEMI), unspecified artery ([place])      NEW MEDICATIONS STARTED DURING THIS VISIT:  Current Discharge Medication List       Note:  This document was prepared using Dragon voice recognition software and may include unintentional dictation errors.    Loney Hering, MD 11/24/16 0000

## 2016-11-24 ENCOUNTER — Encounter: Payer: Self-pay | Admitting: Internal Medicine

## 2016-11-24 ENCOUNTER — Inpatient Hospital Stay (HOSPITAL_COMMUNITY)
Admit: 2016-11-24 | Discharge: 2016-11-24 | Disposition: A | Discharge status: Home / Self Care | Payer: 59 | Attending: Internal Medicine | Admitting: Internal Medicine

## 2016-11-24 ENCOUNTER — Inpatient Hospital Stay: Payer: 59

## 2016-11-24 DIAGNOSIS — Z7951 Long term (current) use of inhaled steroids: Secondary | ICD-10-CM | POA: Diagnosis not present

## 2016-11-24 DIAGNOSIS — I2510 Atherosclerotic heart disease of native coronary artery without angina pectoris: Secondary | ICD-10-CM | POA: Diagnosis present

## 2016-11-24 DIAGNOSIS — Z95810 Presence of automatic (implantable) cardiac defibrillator: Secondary | ICD-10-CM | POA: Diagnosis not present

## 2016-11-24 DIAGNOSIS — I5023 Acute on chronic systolic (congestive) heart failure: Secondary | ICD-10-CM | POA: Diagnosis present

## 2016-11-24 DIAGNOSIS — E119 Type 2 diabetes mellitus without complications: Secondary | ICD-10-CM | POA: Diagnosis present

## 2016-11-24 DIAGNOSIS — R0902 Hypoxemia: Secondary | ICD-10-CM | POA: Diagnosis present

## 2016-11-24 DIAGNOSIS — Z79899 Other long term (current) drug therapy: Secondary | ICD-10-CM | POA: Diagnosis not present

## 2016-11-24 DIAGNOSIS — I252 Old myocardial infarction: Secondary | ICD-10-CM | POA: Diagnosis not present

## 2016-11-24 DIAGNOSIS — I5022 Chronic systolic (congestive) heart failure: Secondary | ICD-10-CM | POA: Diagnosis present

## 2016-11-24 DIAGNOSIS — I10 Essential (primary) hypertension: Secondary | ICD-10-CM | POA: Diagnosis not present

## 2016-11-24 DIAGNOSIS — R9431 Abnormal electrocardiogram [ECG] [EKG]: Secondary | ICD-10-CM

## 2016-11-24 DIAGNOSIS — J9601 Acute respiratory failure with hypoxia: Secondary | ICD-10-CM | POA: Diagnosis not present

## 2016-11-24 DIAGNOSIS — E108 Type 1 diabetes mellitus with unspecified complications: Secondary | ICD-10-CM | POA: Diagnosis not present

## 2016-11-24 DIAGNOSIS — I255 Ischemic cardiomyopathy: Secondary | ICD-10-CM | POA: Diagnosis present

## 2016-11-24 DIAGNOSIS — Z7982 Long term (current) use of aspirin: Secondary | ICD-10-CM | POA: Diagnosis not present

## 2016-11-24 DIAGNOSIS — I5021 Acute systolic (congestive) heart failure: Secondary | ICD-10-CM | POA: Diagnosis not present

## 2016-11-24 DIAGNOSIS — Z9889 Other specified postprocedural states: Secondary | ICD-10-CM | POA: Diagnosis not present

## 2016-11-24 DIAGNOSIS — I110 Hypertensive heart disease with heart failure: Secondary | ICD-10-CM | POA: Diagnosis present

## 2016-11-24 DIAGNOSIS — R0602 Shortness of breath: Secondary | ICD-10-CM | POA: Diagnosis present

## 2016-11-24 DIAGNOSIS — Z794 Long term (current) use of insulin: Secondary | ICD-10-CM | POA: Diagnosis not present

## 2016-11-24 DIAGNOSIS — I161 Hypertensive emergency: Secondary | ICD-10-CM | POA: Diagnosis present

## 2016-11-24 DIAGNOSIS — E785 Hyperlipidemia, unspecified: Secondary | ICD-10-CM | POA: Diagnosis present

## 2016-11-24 DIAGNOSIS — T82855A Stenosis of coronary artery stent, initial encounter: Secondary | ICD-10-CM | POA: Diagnosis present

## 2016-11-24 DIAGNOSIS — Z888 Allergy status to other drugs, medicaments and biological substances status: Secondary | ICD-10-CM | POA: Diagnosis not present

## 2016-11-24 DIAGNOSIS — I4510 Unspecified right bundle-branch block: Secondary | ICD-10-CM | POA: Diagnosis present

## 2016-11-24 DIAGNOSIS — Z8249 Family history of ischemic heart disease and other diseases of the circulatory system: Secondary | ICD-10-CM | POA: Diagnosis not present

## 2016-11-24 DIAGNOSIS — R000 Tachycardia, unspecified: Secondary | ICD-10-CM | POA: Diagnosis present

## 2016-11-24 LAB — BRAIN NATRIURETIC PEPTIDE: B Natriuretic Peptide: 117 pg/mL — ABNORMAL HIGH (ref 0.0–100.0)

## 2016-11-24 LAB — TROPONIN I
TROPONIN I: 0.03 ng/mL — AB (ref <0.03)
TROPONIN I: 0.04 ng/mL — AB (ref <0.03)
Troponin I: 0.05 ng/mL (ref <0.03)
Troponin I: <0.03 ng/mL (ref <0.03)

## 2016-11-24 LAB — LIPID PANEL
Cholesterol: 130 mg/dL (ref 0–200)
HDL: 44 mg/dL (ref >40)
LDL CALC: 70 mg/dL (ref 0–99)
Total CHOL/HDL Ratio: 3 RATIO
Triglycerides: 78 mg/dL (ref <150)
VLDL: 16 mg/dL (ref 0–40)

## 2016-11-24 LAB — ECHOCARDIOGRAM COMPLETE
HEIGHTINCHES: 73 in
WEIGHTICAEL: 3403.9 [oz_av]

## 2016-11-24 LAB — COMPREHENSIVE METABOLIC PANEL
ALBUMIN: 3.6 g/dL (ref 3.5–5.0)
ALT: 51 U/L (ref 17–63)
AST: 62 U/L — AB (ref 15–41)
Alkaline Phosphatase: 103 U/L (ref 38–126)
Anion gap: 7 (ref 5–15)
BUN: 16 mg/dL (ref 6–20)
CO2: 23 mmol/L (ref 22–32)
CREATININE: 0.94 mg/dL (ref 0.61–1.24)
Calcium: 8.6 mg/dL — ABNORMAL LOW (ref 8.9–10.3)
Chloride: 109 mmol/L (ref 101–111)
GFR calc Af Amer: >60 mL/min (ref >60)
GFR calc non Af Amer: >60 mL/min (ref >60)
GLUCOSE: 207 mg/dL — AB (ref 65–99)
POTASSIUM: 4.6 mmol/L (ref 3.5–5.1)
Sodium: 139 mmol/L (ref 135–145)
Total Bilirubin: 0.7 mg/dL (ref 0.3–1.2)
Total Protein: 7 g/dL (ref 6.5–8.1)

## 2016-11-24 LAB — GLUCOSE, CAPILLARY
GLUCOSE-CAPILLARY: 256 mg/dL — AB (ref 65–99)
GLUCOSE-CAPILLARY: 212 mg/dL — AB (ref 65–99)
GLUCOSE-CAPILLARY: 338 mg/dL — AB (ref 65–99)
GLUCOSE-CAPILLARY: 267 mg/dL — AB (ref 65–99)
Glucose-Capillary: 157 mg/dL — ABNORMAL HIGH (ref 65–99)
Glucose-Capillary: 168 mg/dL — ABNORMAL HIGH (ref 65–99)

## 2016-11-24 LAB — MRSA PCR SCREENING: MRSA by PCR: NEGATIVE

## 2016-11-24 MED ORDER — ATORVASTATIN CALCIUM 20 MG PO TABS: 40.0000 mg | ORAL_TABLET | Freq: Every day | ORAL | Status: DC

## 2016-11-24 MED ORDER — SODIUM CHLORIDE 0.9 % IV SOLN: 250.0000 mL | INTRAVENOUS | Status: DC | PRN

## 2016-11-24 MED ORDER — LORATADINE 10 MG PO TABS
10.0000 mg | ORAL_TABLET | Freq: Every day | ORAL | Status: DC
  Administered 2016-11-24 – 2016-11-25 (×2): 10 mg via ORAL
  Filled 2016-11-24 (×2): qty 1

## 2016-11-24 MED ORDER — INSULIN DETEMIR 100 UNIT/ML ~~LOC~~ SOLN
13.0000 [IU] | Freq: Every day | SUBCUTANEOUS | Status: DC
  Administered 2016-11-24: 13 [IU] via SUBCUTANEOUS
  Filled 2016-11-24 (×2): qty 0.13

## 2016-11-24 MED ORDER — INSULIN DETEMIR 100 UNIT/ML ~~LOC~~ SOLN
30.0000 [IU] | Freq: Every day | SUBCUTANEOUS | Status: DC
  Administered 2016-11-24 – 2016-11-25 (×2): 30 [IU] via SUBCUTANEOUS
  Filled 2016-11-24 (×2): qty 0.3

## 2016-11-24 MED ORDER — IOPAMIDOL (ISOVUE-300) INJECTION 61%
INTRAVENOUS | Status: DC | PRN
  Administered 2016-11-24: 125 mL

## 2016-11-24 MED ORDER — ONDANSETRON HCL 4 MG/2ML IJ SOLN: 4.0000 mg | Freq: Four times a day (QID) | INTRAMUSCULAR | Status: DC | PRN

## 2016-11-24 MED ORDER — METOCLOPRAMIDE HCL 10 MG PO TABS
10.0000 mg | ORAL_TABLET | Freq: Every day | ORAL | Status: DC
  Administered 2016-11-24: 10 mg via ORAL
  Filled 2016-11-24: qty 1

## 2016-11-24 MED ORDER — CLOPIDOGREL BISULFATE 75 MG PO TABS
75.0000 mg | ORAL_TABLET | Freq: Every day | ORAL | Status: DC
  Administered 2016-11-24 – 2016-11-25 (×2): 75 mg via ORAL
  Filled 2016-11-24 (×2): qty 1

## 2016-11-24 MED ORDER — ENOXAPARIN SODIUM 40 MG/0.4ML ~~LOC~~ SOLN
40.0000 mg | SUBCUTANEOUS | Status: DC
  Administered 2016-11-25: 40 mg via SUBCUTANEOUS
  Filled 2016-11-24: qty 0.4

## 2016-11-24 MED ORDER — ACETAMINOPHEN 325 MG PO TABS
650.0000 mg | ORAL_TABLET | ORAL | Status: DC | PRN
  Filled 2016-11-24: qty 2

## 2016-11-24 MED ORDER — CARVEDILOL 6.25 MG PO TABS
6.2500 mg | ORAL_TABLET | Freq: Two times a day (BID) | ORAL | Status: DC
  Administered 2016-11-24 – 2016-11-25 (×3): 6.25 mg via ORAL
  Filled 2016-11-24 (×3): qty 1

## 2016-11-24 MED ORDER — INSULIN ASPART 100 UNIT/ML ~~LOC~~ SOLN
0.0000 [IU] | Freq: Every day | SUBCUTANEOUS | Status: DC
  Administered 2016-11-24: 3 [IU] via SUBCUTANEOUS
  Filled 2016-11-24: qty 3

## 2016-11-24 MED ORDER — SODIUM CHLORIDE 0.9% FLUSH: 3.0000 mL | INTRAVENOUS | Status: DC | PRN

## 2016-11-24 MED ORDER — SALINE SPRAY 0.65 % NA SOLN
1.0000 | NASAL | Status: DC | PRN
  Filled 2016-11-24: qty 44

## 2016-11-24 MED ORDER — DIGOXIN 125 MCG PO TABS
125.0000 ug | ORAL_TABLET | Freq: Every day | ORAL | Status: DC
  Administered 2016-11-24 – 2016-11-25 (×2): 125 ug via ORAL
  Filled 2016-11-24 (×2): qty 1

## 2016-11-24 MED ORDER — ASPIRIN EC 81 MG PO TBEC
81.0000 mg | DELAYED_RELEASE_TABLET | ORAL | Status: DC
  Administered 2016-11-24 – 2016-11-25 (×2): 81 mg via ORAL
  Filled 2016-11-24 (×2): qty 1

## 2016-11-24 MED ORDER — INSULIN ASPART 100 UNIT/ML ~~LOC~~ SOLN
0.0000 [IU] | Freq: Three times a day (TID) | SUBCUTANEOUS | Status: DC
  Administered 2016-11-24: 4 [IU] via SUBCUTANEOUS
  Administered 2016-11-24: 15 [IU] via SUBCUTANEOUS
  Administered 2016-11-24: 7 [IU] via SUBCUTANEOUS
  Administered 2016-11-25: 4 [IU] via SUBCUTANEOUS
  Administered 2016-11-25: 11 [IU] via SUBCUTANEOUS
  Filled 2016-11-24: qty 15
  Filled 2016-11-24: qty 4
  Filled 2016-11-24: qty 11
  Filled 2016-11-24: qty 7
  Filled 2016-11-24: qty 4

## 2016-11-24 MED ORDER — SODIUM CHLORIDE 0.9% FLUSH
3.0000 mL | Freq: Two times a day (BID) | INTRAVENOUS | Status: DC
  Administered 2016-11-24 – 2016-11-25 (×3): 3 mL via INTRAVENOUS

## 2016-11-24 MED ORDER — LOSARTAN POTASSIUM 50 MG PO TABS
100.0000 mg | ORAL_TABLET | Freq: Every day | ORAL | Status: DC
  Administered 2016-11-24: 100 mg via ORAL
  Filled 2016-11-24: qty 2

## 2016-11-24 MED ORDER — PANTOPRAZOLE SODIUM 40 MG PO TBEC
40.0000 mg | DELAYED_RELEASE_TABLET | Freq: Every day | ORAL | Status: DC
  Administered 2016-11-24 – 2016-11-25 (×2): 40 mg via ORAL
  Filled 2016-11-24 (×2): qty 1

## 2016-11-24 NOTE — Progress Notes (Signed)
Patient arrived to room. A&O, no distress, sitting in chair. Wrist dressing clean dry intact, pulses WDL. Skin checked with Gildardo Pounds RN. Tele box [id number] called in to CCMD with Gerald Stabs NT. Educated on safety. Will continue to monitor.

## 2016-11-24 NOTE — Consult Note (Addendum)
Cardiology Consultation Note    Patient ID: Joseph [name], MRN: [id number], DOB/AGE: Nov 11, 1970 46 y.o. Admit date: 11/23/2016   Date of Consult: 11/24/2016 Primary Physician: [facility] Primary Cardiologist: Lattie Haw, MD [facility])  Chief Complaint: Shortness of breath Reason for Consultation: STEMI Requesting MD: Charlesetta Ivory, MD  HPI: Joseph [name] is a 46 y.o. male with history of coronary artery disease with anterior STEMI and PCI to the ostial/proximal LAD at [facility] on 03/24/2010, ischemic cardiomyopathy with LVEF of 35-40% status post ICD, type 1 diabetes mellitus, and hypertension, who developed acute shortness of breath at approximately 10:30 tonight. He was in his usual state of health earlier in the day. His only issue has been a chronic cough and nasal congestion that he attributes to sinus problems. He took Mucinex nasal spray before going to be this evening and had just laid down when he noticed some wheezing. He then developed significant respiratory distress, prompting him to summon EMS. Upon EMS arrival, [name] was found to be markedly hypertensive with an EKG demonstrating sinus tachycardia and right bundle branch block with 1-2 mm ST segment elevation in V2 through V4. He was placed on CPAP and given nitroglycerin paste with improvement in his dyspnea. Upon arrival in the ED, he continued to complain of some shortness of breath. He has not had any chest pain, palpitations, or lightheadedness. He denies ICD shocks. Given his EKG findings and acute respiratory distress, [name] was taken for emergent left heart catheterization that demonstrated moderate in-stent restenosis of the ostial/proximal LAD stent as well as mild to moderate disease involving jailed D1 and the mid LAD. No high grade obstructive CAD was identified. LVEF was severely reduced with mildly elevated LVEDP. At the Akiah Bauch of the procedure, the patient's dyspnea had improved significantly. He was given  furosemide 20 mg IV x1 at the start of the procedure.  Past Medical History:  Diagnosis Date  . Diabetes mellitus without complication ([place])   . Hyperlipidemia   . Hypertension   . Ischemic cardiomyopathy   . Myocardial infarction 2011   anterior MI at [place] s/p BMS to ostial/proximal LAD  . Systolic heart failure [facility])       Surgical History:  Past Surgical History:  Procedure Laterality Date  . CARDIAC DEFIBRILLATOR PLACEMENT    . CARDIAC SURGERY       Home Meds: Prior to Admission medications   Medication Sig Start Date [name] Date Taking? Authorizing Provider  amLODipine (NORVASC) 5 MG tablet Take 1 tablet (5 mg total) by mouth daily. 04/21/16   [name], MD  amoxicillin-clavulanate (AUGMENTIN) 875-125 MG tablet Take 1 tablet by mouth 2 (two) times daily. 04/21/16   [name], MD  aspirin EC 81 MG tablet Take 81 mg by mouth every morning.    Historical Provider, MD  atorvastatin (LIPITOR) 40 MG tablet Take 40 mg by mouth at bedtime. 02/09/16   Historical Provider, MD  carvedilol (COREG) 12.5 MG tablet Take 12.5 mg by mouth 2 (two) times daily. 02/16/16   Historical Provider, MD  Cholecalciferol (VITAMIN D3) 2000 units capsule Take 2,000 Units by mouth daily. 09/15/11   Historical Provider, MD  digoxin (LANOXIN) 0.125 MG tablet Take 125 mcg by mouth daily. 02/16/16   Historical Provider, MD  fluticasone (FLONASE) 50 MCG/ACT nasal spray Place 2 sprays into both nostrils daily as needed. For allergies/rhinitis. 01/26/16   Historical Provider, MD  furosemide (LASIX) 20 MG tablet Take 20 mg by mouth  daily. 02/09/16   Historical Provider, MD  HUMALOG KWIKPEN 100 UNIT/ML KiwkPen Inject 16-20 Units into the skin 3 (three) times daily before meals. 16 units subcutaneous in the morning, 18 units subcutaneous at lunchtime, and 20 units subcutaneous at night. Plus sliding scale as directed. 03/09/16   Historical Provider, MD  Insulin Detemir (LEVEMIR FLEXTOUCH) 100 UNIT/ML Pen Inject  13-30 Units into the skin See admin instructions. Inject 30 units subcutaneous in the morning, and Inject 13 units subcutaneous every night. 09/17/15   Historical Provider, MD  loratadine (CLARITIN) 10 MG tablet Take 1 tablet (10 mg total) by mouth daily. 04/21/16   [name], MD  losartan (COZAAR) 100 MG tablet Take 100 mg by mouth at bedtime.    Historical Provider, MD  metoCLOPramide (REGLAN) 10 MG tablet Take 10 mg by mouth at bedtime. 12/15/11   Historical Provider, MD  pantoprazole (PROTONIX) 40 MG tablet Take 1 tablet (40 mg total) by mouth daily. 04/21/16   [name], MD  potassium chloride (MICRO-K) 10 MEQ CR capsule Take 10 mEq by mouth daily. 02/23/16   Historical Provider, MD  sodium chloride (OCEAN) 0.65 % SOLN nasal spray Place 1 spray into both nostrils as needed for congestion. 04/21/16   [name], MD    Inpatient Medications:  . aspirin EC  81 mg Oral [id number]  . [START ON 11/25/2016] atorvastatin  40 mg Oral QHS  . carvedilol  6.25 mg Oral BID WC  . digoxin  125 mcg Oral Daily  . [START ON 11/25/2016] enoxaparin (LOVENOX) injection  40 mg Subcutaneous Q24H  . insulin aspart  0-20 Units Subcutaneous TID WC  . insulin aspart  0-5 Units Subcutaneous QHS  . insulin detemir  13 Units Subcutaneous QHS  . insulin detemir  30 Units Subcutaneous QPC breakfast  . loratadine  10 mg Oral Daily  . losartan  100 mg Oral QHS  . metoCLOPramide  10 mg Oral QHS  . pantoprazole  40 mg Oral QAC breakfast  . sodium chloride flush  3 mL Intravenous Q12H     Allergies:  Allergies  Allergen Reactions  . Vancomycin Shortness Of Breath  . Lisinopril Rash    Social History   Social History  . Marital status: Single    Spouse name: N/A  . Number of children: N/A  . Years of education: N/A   Occupational History  . Not on file.   Social History Main Topics  . Smoking status: Never Smoker  . Smokeless tobacco: Not on file  . Alcohol use No  . Drug use:  Unknown  . Sexual activity: Yes   Other Topics Concern  . Not on file   Social History Narrative  . No narrative on file     Family History  Problem Relation Age of Onset  . Hypertension Mother   . Heart attack Paternal Grandmother      Review of Systems: A 12-system review of systems was performed and is negative except as noted in the HPI.  Labs:  Recent Labs  11/23/16 2336  TROPONINI <0.03   Lab Results  Component Value Date   WBC 10.1 11/23/2016   HGB 15.3 11/23/2016   HCT 45.2 11/23/2016   MCV 89.3 11/23/2016   PLT 150 11/23/2016     Recent Labs Lab 11/23/16 2336  NA 139  K 4.6  CL 109  CO2 23  BUN 16  CREATININE 0.94  CALCIUM 8.6*  PROT 7.0  BILITOT 0.7  ALKPHOS 103  ALT 51  AST 62*  GLUCOSE 207*   Lab Results  Component Value Date   CHOL 130 11/23/2016   HDL 44 11/23/2016   LDLCALC 70 11/23/2016   TRIG 78 11/23/2016   No results found for: DDIMER  Radiology/Studies:  No results found.  Wt Readings from Last 3 Encounters:  11/24/16 212 lb 11.9 oz (96.5 kg)  04/20/16 214 lb 11.2 oz (97.4 kg)    EKG: Normal sinus rhythm with right bundle branch block and left anterior fascicular block (bifascicular block) and poor R-wave progression. Compared with prior tracing obtained yesterday, heart rate has decreased and anterior ST segment elevation has resolved (I have personally reviewed both tracings).  Physical Exam: Blood pressure 117/72, pulse (!) 107, temperature 97.8 F (36.6 C), temperature source Oral, resp. rate (!) 25, height 6\' 1"  (1.854 m), weight 212 lb 11.9 oz (96.5 kg), SpO2 92 %. Body mass index is 28.07 kg/m. General: Overweight man, lying comfortably in bed. Head: Normocephalic, atraumatic, sclera non-icteric, no xanthomas, nares are without discharge.  Neck: Negative for carotid bruits. JVP approximately 10 cm. Lungs: Normal work of breathing. Mildly diminished breath sounds throughout without wheezes. Question faint  bibasilar crackles. Heart: RRR with S1 S2. No murmurs, rubs, or gallops appreciated. Abdomen: Soft, non-tender, non-distended with normoactive bowel sounds. No hepatomegaly. No rebound/guarding. No obvious abdominal masses. Msk:  Strength and tone appear normal for age. Extremities: 1+ pretibial edema bilaterally.  Distal pedal pulses are 2+ and equal bilaterally. TR band in place at the right wrist. Neuro: Alert and oriented X 3. No facial asymmetry. No focal deficit. Moves all extremities spontaneously. Psych:  Responds to questions appropriately with a normal affect.    Assessment and Plan  46 year old man with history of CAD status post anterior STEMI [id number], chronic systolic heart failure secondary to ischemic cardiomyopathy, type 1 diabetes mellitus, and hypertension, admitted with acute respiratory distress.  Acute hypoxic respiratory distress Was likely due to acute decompensated heart failure in the setting of marked hypertension, consistent with hypertensive emergency. No evidence of obstructive CAD contributing to symptoms. The patient has improved considerably with CPAP, blood pressure control, and IV diuresis.  Admitted to ICU on the hospitalist service.  Continue gentle diuresis.  Obtain chest x-ray.  Wean oxygen as tolerated.  Acute on chronic systolic heart failure Unclear what precipitant was, though patient likely had flash pulmonary edema in the setting of uncontrolled hypertension. Cath shows probably mildly elevated left ventricular filling pressure, though significant respiratory variation was noted. LVEF is severely reduced.  Gentle diuresis.  Decreased carvedilol 6.25 mg twice a day in the setting of acute decompensated heart failure. This can be uptitrated as tolerated.  Obtain transthoracic echocardiogram.  Check BNP.  Could consider switching losartan to [facility] and/or adding spironolactone prior to discharge.  Coronary artery disease No chest pain. EKG  demonstrated anterior ST segment elevation by EMS on arrival and on arrival in the ED. However, this has since resolved. No obstructive coronary lesions were identified by cath, though sluggish flow was noted in the distal LAD most likely related to microvascular dysfunction and cardiomyopathy.  Trend troponin until it has peaked, then stop.  Continue aggressive secondary prevention, including aspirin and high intensity statin therapy.  Hypertension Patient was initially very hypertensive at home and upon arrival in the ED. Blood pressure has normalized and is at the lower Crissa Sowder of normal at this time.  Hold amlodipine for the time being due to soft blood pressure post catheterization.  Continue  losartan every night; could consider switching to Entresto at some point.  Type 1 diabetes mellitus  Start home doses of Levemir plus sliding scale insulin.  Defer further management to hospitalist service.  Verlan Friends Jisele Price MD 11/24/2016, 1:23 AM Pager: [phone]

## 2016-11-24 NOTE — Progress Notes (Signed)
Patient is seen by Dr. Posey Pronto, [facility] Cardiology. Discussed with patient, given him the option to stay with [facility] or change to [facility] while he is an inpatient He would prefer to stay within the [facility] system for now. [facility] on call will be contacted to round on the patient.   Signed, Esmond Plants, MD, Ph.D [facility]

## 2016-11-24 NOTE — Progress Notes (Signed)
CBG 338 per Gerald Stabs NT. Glucometer not synched yet.

## 2016-11-24 NOTE — Progress Notes (Signed)
Pt admitted to unit from cath lab. Pt denies pain throughout shift. TR band deflated and removed per protocol. No bleeding or bruising. Will continue to monitor.

## 2016-11-24 NOTE — Progress Notes (Signed)
Report called to Lovena Le, RN on 2A.  Patient has been A&Ox4 with no complaints of pain.  VSS.  o2 sats mid 90's on RA.  Tolerating diet.

## 2016-11-24 NOTE — Progress Notes (Signed)
Patient moved to room 236 by wheelchair with RN.  Sitting up in recliner chair in new room with no distress noted.  2A tele box on patient and central tele notified.

## 2016-11-24 NOTE — H&P (Addendum)
[facility] [place] NAME: Joseph [name]    MR#:  [id number]  DATE OF BIRTH:  06/20/1971  DATE OF ADMISSION:  11/23/2016  PRIMARY CARE PHYSICIAN: [place]   REQUESTING/REFERRING PHYSICIAN: Toma Deiters  MD  CHIEF COMPLAINT:   Chief Complaint  Patient presents with  . Shortness of Breath    HISTORY OF PRESENT ILLNESS: Joseph [name]  is a 46 y.o. male with a known history of  Diabetes type 2, essential hypertension, hyperlipidemia, ischemic cardiomyopathy, previous history of MI with a stent in 2011 at [facility] as well as chronic systolic CHF who presented to the ED yesterday with acute onset of shortness of breath at 10:30 PM last night. Patient came to the ER and was noted to have respiratory distress. Patient was markedly hypertensive with EKG demonstrating sinus tachycardia and right bundle branch block with 1-2 mm ST segment elevation in V2 through V4. He was placed on initially BiPAP and given nitroglycerin with improvement in his dyspnea. Patient did not have any chest pain however due to his EKG findings he was taken emergently for cardiac catheterization. The catheterization that demonstrated moderate in-stent restenosis of the osteolysis last proximal LAD stent. Patient currently is feeling better. He will is being treated with systolic CHF exasperation. He denies any chest pain.     PAST MEDICAL HISTORY:   Past Medical History:  Diagnosis Date  . Diabetes mellitus without complication ([place])   . Hyperlipidemia   . Hypertension   . Ischemic cardiomyopathy   . Myocardial infarction 2011   anterior MI at [place] s/p BMS to ostial/proximal LAD  . Systolic heart failure ([place])     PAST SURGICAL HISTORY: Past Surgical History:  Procedure Laterality Date  . CARDIAC DEFIBRILLATOR PLACEMENT    . CARDIAC SURGERY    . LEFT HEART CATH AND CORONARY ANGIOGRAPHY N/A 11/23/2016   Procedure: Left Heart Cath and Coronary  Angiography;  Surgeon: Nelva Bush, MD;  Location: [facility];  Service: Cardiovascular;  Laterality: N/A;    SOCIAL HISTORY:  Social History  Substance Use Topics  . Smoking status: Never Smoker  . Smokeless tobacco: Not on file  . Alcohol use No    FAMILY HISTORY:  Family History  Problem Relation Age of Onset  . Hypertension Mother   . Heart attack Paternal Grandmother     DRUG ALLERGIES:  Allergies  Allergen Reactions  . Vancomycin Shortness Of Breath  . Lisinopril Rash    REVIEW OF SYSTEMS:   CONSTITUTIONAL: No fever, fatigue or weakness.  EYES: No blurred or double vision.  EARS, NOSE, AND THROAT: No tinnitus or ear pain.  RESPIRATORY: No cough,  positive shortness of breath, no wheezing or hemoptysis.  CARDIOVASCULAR: No chest pain, orthopnea, edema.  GASTROINTESTINAL: No nausea, vomiting, diarrhea or abdominal pain.  GENITOURINARY: No dysuria, hematuria.  ENDOCRINE: No polyuria, nocturia,  HEMATOLOGY: No anemia, easy bruising or bleeding SKIN: No rash or lesion. + edema MUSCULOSKELETAL: No joint pain or arthritis.   NEUROLOGIC: No tingling, numbness, weakness.  PSYCHIATRY: No anxiety or depression.   MEDICATIONS AT HOME:  Prior to Admission medications   Medication Sig Start Date End Date Taking? Authorizing Provider  amLODipine (NORVASC) 5 MG tablet Take 1 tablet (5 mg total) by mouth daily. 04/21/16   [name], MD  amoxicillin-clavulanate (AUGMENTIN) 875-125 MG tablet Take 1 tablet by mouth 2 (two) times daily. 04/21/16   [name], MD  aspirin EC  81 MG tablet Take 81 mg by mouth every morning.    Historical Provider, MD  atorvastatin (LIPITOR) 40 MG tablet Take 40 mg by mouth at bedtime. 02/09/16   Historical Provider, MD  carvedilol (COREG) 12.5 MG tablet Take 12.5 mg by mouth 2 (two) times daily. 02/16/16   Historical Provider, MD  Cholecalciferol (VITAMIN D3) 2000 units capsule Take 2,000 Units by mouth daily. 09/15/11    Historical Provider, MD  digoxin (LANOXIN) 0.125 MG tablet Take 125 mcg by mouth daily. 02/16/16   Historical Provider, MD  fluticasone (FLONASE) 50 MCG/ACT nasal spray Place 2 sprays into both nostrils daily as needed. For allergies/rhinitis. 01/26/16   Historical Provider, MD  furosemide (LASIX) 20 MG tablet Take 20 mg by mouth daily. 02/09/16   Historical Provider, MD  HUMALOG KWIKPEN 100 UNIT/ML KiwkPen Inject 16-20 Units into the skin 3 (three) times daily before meals. 16 units subcutaneous in the morning, 18 units subcutaneous at lunchtime, and 20 units subcutaneous at night. Plus sliding scale as directed. 03/09/16   Historical Provider, MD  Insulin Detemir (LEVEMIR FLEXTOUCH) 100 UNIT/ML Pen Inject 13-30 Units into the skin See admin instructions. Inject 30 units subcutaneous in the morning, and Inject 13 units subcutaneous every night. 09/17/15   Historical Provider, MD  loratadine (CLARITIN) 10 MG tablet Take 1 tablet (10 mg total) by mouth daily. 04/21/16   [name], MD  losartan (COZAAR) 100 MG tablet Take 100 mg by mouth at bedtime.    Historical Provider, MD  metoCLOPramide (REGLAN) 10 MG tablet Take 10 mg by mouth at bedtime. 12/15/11   Historical Provider, MD  pantoprazole (PROTONIX) 40 MG tablet Take 1 tablet (40 mg total) by mouth daily. 04/21/16   [name], MD  potassium chloride (MICRO-K) 10 MEQ CR capsule Take 10 mEq by mouth daily. 02/23/16   Historical Provider, MD  sodium chloride (OCEAN) 0.65 % SOLN nasal spray Place 1 spray into both nostrils as needed for congestion. 04/21/16   [name], MD      PHYSICAL EXAMINATION:   VITAL SIGNS: Blood pressure 125/82, pulse 69, temperature 97.7 F (36.5 C), resp. rate 19, height 6\' 1"  (1.854 m), weight 212 lb 11.9 oz (96.5 kg), SpO2 97 %.  GENERAL:  46 y.o.-year-old patient lying in the bed with no acute distress.  EYES: Pupils equal, round, reactive to light and accommodation. No scleral icterus.  Extraocular muscles intact.  HEENT: Head atraumatic, normocephalic. Oropharynx and nasopharynx clear.  NECK:  Supple, no jugular venous distention. No thyroid enlargement, no tenderness.  LUNGS:  cracles bilatreally, no wheezing, rales,rhonchi or crepitation. No use of accessory muscles of respiration.  CARDIOVASCULAR: S1, S2 normal. No murmurs, rubs, or gallops.  ABDOMEN: Soft, nontender, nondistended. Bowel sounds present. No organomegaly or mass.  EXTREMITIES: 1+ pedal edema, cyanosis, or clubbing.  NEUROLOGIC: Cranial nerves II through XII are intact. Muscle strength 5/5 in all extremities. Sensation intact. Gait not checked.  PSYCHIATRIC: The patient is alert and oriented x 3.  SKIN: No obvious rash, lesion, or ulcer.   LABORATORY PANEL:   CBC  Recent Labs Lab 11/23/16 2336  WBC 10.1  HGB 15.3  HCT 45.2  PLT 150  MCV 89.3  MCH 30.3  MCHC 33.9  RDW 13.3  LYMPHSABS 3.3  MONOABS 1.1*  EOSABS 0.6  BASOSABS 0.0   ------------------------------------------------------------------------------------------------------------------  Chemistries   Recent Labs Lab 11/23/16 2336  NA 139  K 4.6  CL 109  CO2 23  GLUCOSE 207*  BUN 16  CREATININE 0.94  CALCIUM 8.6*  AST 62*  ALT 51  ALKPHOS 103  BILITOT 0.7   ------------------------------------------------------------------------------------------------------------------ estimated creatinine clearance is 121.4 mL/min (by C-G formula based on SCr of 0.94 mg/dL). ------------------------------------------------------------------------------------------------------------------ No results for input(s): TSH, T4TOTAL, T3FREE, THYROIDAB in the last 72 hours.  Invalid input(s): FREET3   Coagulation profile  Recent Labs Lab 11/23/16 2336  INR 1.00   ------------------------------------------------------------------------------------------------------------------- No results for input(s): DDIMER in the last 72  hours. -------------------------------------------------------------------------------------------------------------------  Cardiac Enzymes  Recent Labs Lab 11/23/16 2336 11/24/16 0131 11/24/16 0758  TROPONINI <0.03 0.03* 0.04*   ------------------------------------------------------------------------------------------------------------------ Invalid input(s): POCBNP  ---------------------------------------------------------------------------------------------------------------  Urinalysis    Component Value Date/Time   COLORURINE YELLOW (A) 04/18/2016 1011   APPEARANCEUR CLEAR (A) 04/18/2016 1011   LABSPEC 1.028 04/18/2016 1011   PHURINE 5.0 04/18/2016 1011   GLUCOSEU >500 (A) 04/18/2016 1011   HGBUR 2+ (A) 04/18/2016 1011   BILIRUBINUR NEGATIVE 04/18/2016 1011   KETONESUR 2+ (A) 04/18/2016 1011   PROTEINUR 100 (A) 04/18/2016 1011   NITRITE NEGATIVE 04/18/2016 1011   LEUKOCYTESUR NEGATIVE 04/18/2016 1011     RADIOLOGY: Dg Chest Port 1 View  Result Date: 11/24/2016 CLINICAL DATA:  Shortness of breath EXAM: PORTABLE CHEST 1 VIEW COMPARISON:  04/20/2016 FINDINGS: Cardiac pacemaker. Normal heart size and pulmonary vascularity. No focal airspace disease or consolidation in the lungs. No blunting of costophrenic angles. No pneumothorax. Mediastinal contours appear intact. IMPRESSION: No active disease. Electronically Signed   By: Lucienne Capers M.D.   On: 11/24/2016 01:42    EKG: Orders placed or performed during the hospital encounter of 11/23/16  . ED EKG  . ED EKG  . EKG 12-Lead  . EKG 12-Lead  . EKG 12-Lead  . EKG 12-Lead  . ED EKG  . ED EKG  . EKG 12-Lead  . EKG 12-Lead  . EKG    IMPRESSION AND PLAN: Patient is a 46 year old admitted with st MI  1. Acute ST EMI was ruled out Patient does have in-stent stenosis We'll continue therapy with aspirin I will add Plavix to his current regimen Continue therapy with Coreg Continue Lipitor 40 mg Lipid panel in  the morning  2. Acute on chronic systolic CHF will treat with IV Lasix monitor renal function  3. Hyperlipidemia lipid panel in the a.m. continue Lipitor  4. Diabetes type II continue Levemir and sliding scale insulin.  5. Essential hypertension continue therapy with Cozaar and Coreg    All the records are reviewed and case discussed with ED provider. Management plans discussed with the patient, family and they are in agreement.  CODE STATUS:    Code Status Orders        Start     Ordered   11/24/16 0050  Full code  Continuous     11/24/16 0049    Code Status History    Date Active Date Inactive Code Status Order ID Comments User Context   04/18/2016  2:20 PM 04/21/2016  4:13 PM Full Code [id number]  Idelle Crouch, MD Inpatient       TOTAL TIME TAKING CARE OF THIS PATIENT:55 minutes.    Dustin Flock M.D on 11/24/2016 at 9:40 AM  Between 7am to 6pm - Pager - [phone]  After 6pm go to [email or web address] - password [email or web address] [facility]  Office  [phone]  CC: Primary care physician; [facility]

## 2016-11-24 NOTE — Progress Notes (Signed)
[facility]  SUBJECTIVE: I feel much better   Vitals:   11/24/16 0600 11/24/16 0700 11/24/16 0800 11/24/16 0804  BP: 112/69 118/75 127/85   Pulse: 63 62 66   Resp: 14 16 (!) 21   Temp:    97.7 F (36.5 C)  TempSrc:      SpO2: 96% 96% 97%   Weight:      Height:         Intake/Output Summary (Last 24 hours) at 11/24/16 0901 Last data filed at 11/24/16 0300  Gross per 24 hour  Intake                0 ml  Output             1100 ml  Net            -1100 ml      PHYSICAL EXAM  General: Well developed, well nourished, in no acute distress HEENT:  Normocephalic and atramatic Neck:  No JVD.  Lungs: Clear bilaterally to auscultation and percussion. Heart: HRRR . Normal S1 and S2 without gallops or murmurs.  Abdomen: Bowel sounds are positive, abdomen soft and non-tender  Msk:  Back normal, normal gait. Normal strength and tone for age. Extremities: No clubbing, cyanosis or edema.   Neuro: Alert and oriented X 3. Psych:  Good affect, responds appropriately   LABS: Basic Metabolic Panel:  Recent Labs  11/23/16 2336  NA 139  K 4.6  CL 109  CO2 23  GLUCOSE 207*  BUN 16  CREATININE 0.94  CALCIUM 8.6*   Liver Function Tests:  Recent Labs  11/23/16 2336  AST 62*  ALT 51  ALKPHOS 103  BILITOT 0.7  PROT 7.0  ALBUMIN 3.6   No results for input(s): LIPASE, AMYLASE in the last 72 hours. CBC:  Recent Labs  11/23/16 2336  WBC 10.1  NEUTROABS 5.1  HGB 15.3  HCT 45.2  MCV 89.3  PLT 150   Cardiac Enzymes:  Recent Labs  11/23/16 2336 11/24/16 0131 11/24/16 0758  TROPONINI <0.03 0.03* 0.04*   BNP: Invalid input(s): POCBNP D-Dimer: No results for input(s): DDIMER in the last 72 hours. Hemoglobin A1C: No results for input(s): HGBA1C in the last 72 hours. Fasting Lipid Panel:  Recent Labs  11/23/16 2336  CHOL 130  HDL 44  LDLCALC 70  TRIG 78  CHOLHDL 3.0   Thyroid Function Tests: No results for input(s): TSH, T4TOTAL, T3FREE, THYROIDAB  in the last 72 hours.  Invalid input(s): FREET3 Anemia Panel: No results for input(s): VITAMINB12, FOLATE, FERRITIN, TIBC, IRON, RETICCTPCT in the last 72 hours.  Dg Chest Port 1 View  Result Date: 11/24/2016 CLINICAL DATA:  Shortness of breath EXAM: PORTABLE CHEST 1 VIEW COMPARISON:  04/20/2016 FINDINGS: Cardiac pacemaker. Normal heart size and pulmonary vascularity. No focal airspace disease or consolidation in the lungs. No blunting of costophrenic angles. No pneumothorax. Mediastinal contours appear intact. IMPRESSION: No active disease. Electronically Signed   By: Lucienne Capers M.D.   On: 11/24/2016 01:42     Echo   TELEMETRY: Normal sinus rhythm:  ASSESSMENT AND PLAN:  Principal Problem:   Acute on chronic systolic heart failure (HCC) Active Problems:   Shortness of breath   Abnormal EKG    1. Acute on chronic systolic congestive heart failure, improved after initial diuresis 2. Moderate in-stent restenosis ostial LAD by urgent cardiac catheterization 11/23/2016, not felt to be occlusive, treated medically 3. Ischemic cardiomyopathy, LV ejection fraction 25-30% left ventriculography  4. Type 1 diabetes 5. Chronic sinusitis, appointment with Dr. Richardson Landry pending  Recommendations  1. Continue current therapy 2. Review 2-D echocardiogram 3. May transfer to telemetry   Isaias Cowman, MD, PhD, [facility] 11/24/2016 9:01 AM

## 2016-11-24 NOTE — Progress Notes (Signed)
RN made Dr. Saralyn Pilar aware that patient's most recent troponin is 0.05, and MD acknowledg giving no new orders.

## 2016-11-24 NOTE — Progress Notes (Signed)
*  PRELIMINARY RESULTS* Echocardiogram 2D Echocardiogram has been performed.  Joseph [name] 11/24/2016, 10:34 AM

## 2016-11-25 ENCOUNTER — Telehealth: Payer: Self-pay | Admitting: Internal Medicine

## 2016-11-25 LAB — BASIC METABOLIC PANEL
Anion gap: 6 (ref 5–15)
BUN: 16 mg/dL (ref 6–20)
CALCIUM: 8 mg/dL — AB (ref 8.9–10.3)
CHLORIDE: 107 mmol/L (ref 101–111)
CO2: 26 mmol/L (ref 22–32)
CREATININE: 0.75 mg/dL (ref 0.61–1.24)
GFR calc Af Amer: >60 mL/min (ref >60)
GFR calc non Af Amer: >60 mL/min (ref >60)
GLUCOSE: 227 mg/dL — AB (ref 65–99)
Potassium: 3.5 mmol/L (ref 3.5–5.1)
Sodium: 139 mmol/L (ref 135–145)

## 2016-11-25 LAB — LIPID PANEL
CHOL/HDL RATIO: 3.3 ratio
CHOLESTEROL: 128 mg/dL (ref 0–200)
HDL: 39 mg/dL — AB (ref >40)
LDL Cholesterol: 73 mg/dL (ref 0–99)
TRIGLYCERIDES: 80 mg/dL (ref <150)
VLDL: 16 mg/dL (ref 0–40)

## 2016-11-25 LAB — GLUCOSE, CAPILLARY
Glucose-Capillary: 178 mg/dL — ABNORMAL HIGH (ref 65–99)
Glucose-Capillary: 291 mg/dL — ABNORMAL HIGH (ref 65–99)

## 2016-11-25 LAB — HEMOGLOBIN A1C
Hgb A1c MFr Bld: 11.2 % — ABNORMAL HIGH (ref 4.8–5.6)
Mean Plasma Glucose: 275 mg/dL

## 2016-11-25 LAB — HIV ANTIBODY (ROUTINE TESTING W REFLEX): HIV Screen 4th Generation wRfx: NONREACTIVE

## 2016-11-25 MED ORDER — CLOPIDOGREL BISULFATE 75 MG PO TABS: 75.0000 mg | ORAL_TABLET | Freq: Every day | ORAL | 0 refills | Status: DC

## 2016-11-25 MED ORDER — FUROSEMIDE 20 MG PO TABS: 20.0000 mg | ORAL_TABLET | Freq: Two times a day (BID) | ORAL | 0 refills | Status: DC

## 2016-11-25 MED ORDER — INSULIN ASPART 100 UNIT/ML ~~LOC~~ SOLN
3.0000 [IU] | Freq: Three times a day (TID) | SUBCUTANEOUS | Status: DC
  Administered 2016-11-25: 3 [IU] via SUBCUTANEOUS
  Filled 2016-11-25: qty 3

## 2016-11-25 NOTE — Telephone Encounter (Signed)
Per [facility] pt needs sleep apnea eval. Please advise of where to schedule.

## 2016-11-25 NOTE — Telephone Encounter (Signed)
Pt is coming 11/29/16

## 2016-11-25 NOTE — Progress Notes (Signed)
Inpatient Diabetes Program Recommendations  AACE/ADA: New Consensus Statement on Inpatient Glycemic Control (2015)  Target Ranges:  Prepandial:   less than 140 mg/dL      Peak postprandial:   less than 180 mg/dL (1-2 hours)      Critically ill patients:  140 - 180 mg/dL   Lab Results  Component Value Date   GLUCAP 178 (H) 11/25/2016   HGBA1C 11.2 (H) 11/24/2016    Review of Glycemic Control  Results for Joseph [name], Joseph [name] (MRN [id number]) as of 11/25/2016 08:40  Ref. Range 11/24/2016 17:04 11/24/2016 21:18 11/25/2016 03:24 11/25/2016 07:34  Glucose-Capillary Latest Ref Range: 65 - 99 mg/dL 338 (H) 267 (H)  178 (H)    Diabetes history: Type 2 Outpatient Diabetes medications: Humalog 16 units before breakfast, 18 units before lunch and 20 units before supper, Levemir 30 units qam, Levemir 13 units before bed Current orders for Inpatient glycemic control: Levemir 30 units qam, Levemir 13 units before bed, Novolog 0-20 units tid, Novolog 0-5 units qhs  Inpatient Diabetes Program Recommendations:  Consider adding 7 units tid with meals- continue Novolog correction as ordered.   Gentry Fitz, RN, BA, MHA, CDE Diabetes Coordinator Inpatient Diabetes Program  [phone] (Team Pager) [phone] ([place]) 11/25/2016 8:43 AM

## 2016-11-25 NOTE — Discharge Instructions (Signed)
[place] at [place]:  Cardiac diet ,dieabetic diet  DISCHARGE CONDITION:  Stable  ACTIVITY:  Activity as tolerated  OXYGEN:  Home Oxygen: No.   Oxygen Delivery: room air  DISCHARGE LOCATION:  home    ADDITIONAL DISCHARGE INSTRUCTION:   If you experience worsening of your admission symptoms, develop shortness of breath, life threatening emergency, suicidal or homicidal thoughts you must seek medical attention immediately by calling 911 or calling your MD immediately  if symptoms less severe.  You Must read complete instructions/literature along with all the possible adverse reactions/side effects for all the Medicines you take and that have been prescribed to you. Take any new Medicines after you have completely understood and accpet all the possible adverse reactions/side effects.   Please note  You were cared for by a hospitalist during your hospital stay. If you have any questions about your discharge medications or the care you received while you were in the hospital after you are discharged, you can call the unit and asked to speak with the hospitalist on call if the hospitalist that took care of you is not available. Once you are discharged, your primary care physician will handle any further medical issues. Please note that NO REFILLS for any discharge medications will be authorized once you are discharged, as it is imperative that you return to your primary care physician (or establish a relationship with a primary care physician if you do not have one) for your aftercare needs so that they can reassess your need for medications and monitor your lab values.

## 2016-11-25 NOTE — Progress Notes (Signed)
Patient given discharge teaching and paperwork regarding medications, diet, follow-up appointments and activity. Patient understanding verbalized. No complaints at this time. IV and telemetry discontinued prior to leaving. Skin assessment as previously charted and vitals are stable; on room air. Patient being discharged to home. Caregiver/family present during discharge teaching. No further needs by Care Management. Prescriptions sent to pharmacy by MD.

## 2016-11-25 NOTE — Progress Notes (Signed)
[facility]  SUBJECTIVE: Patient states he feels better this morning. He denies chest pain, palpitations, heart racing, or peripheral edema. He has ambulated this morning without difficulty and denies exertional chest pain.   Vitals:   11/24/16 1619 11/24/16 1950 11/25/16 0337 11/25/16 0817  BP: (!) 151/77 (!) 141/74 130/76 (!) 158/92  Pulse: 72 72 72 76  Resp: 16 14 15 16   Temp: 98 F (36.7 C) 98.6 F (37 C) 98.4 F (36.9 C) 97.8 F (36.6 C)  TempSrc: Oral Oral Oral Oral  SpO2: 97% 97% 97% 96%  Weight:      Height:         Intake/Output Summary (Last 24 hours) at 11/25/16 0856 Last data filed at 11/25/16 0036  Gross per 24 hour  Intake             1230 ml  Output              600 ml  Net              630 ml      PHYSICAL EXAM  General: Well developed, well nourished, in no acute distress HEENT:  Normocephalic and atramatic Neck:  No JVD.  Lungs: Clear bilaterally to auscultation and percussion. Heart: HRRR . Normal S1 and S2 without gallops or murmurs.  Abdomen: Bowel sounds are positive, abdomen soft and non-tender  Msk:  Back normal, normal gait. Normal strength and tone for age. Extremities: No clubbing, cyanosis or edema.   Neuro: Alert and oriented X 3. Psych:  Good affect, responds appropriately   LABS: Basic Metabolic Panel:  Recent Labs  11/23/16 2336 11/25/16 0324  NA 139 139  K 4.6 3.5  CL 109 107  CO2 23 26  GLUCOSE 207* 227*  BUN 16 16  CREATININE 0.94 0.75  CALCIUM 8.6* 8.0*   Liver Function Tests:  Recent Labs  11/23/16 2336  AST 62*  ALT 51  ALKPHOS 103  BILITOT 0.7  PROT 7.0  ALBUMIN 3.6   No results for input(s): LIPASE, AMYLASE in the last 72 hours. CBC:  Recent Labs  11/23/16 2336  WBC 10.1  NEUTROABS 5.1  HGB 15.3  HCT 45.2  MCV 89.3  PLT 150   Cardiac Enzymes:  Recent Labs  11/24/16 0131 11/24/16 0758 11/24/16 1317  TROPONINI 0.03* 0.04* 0.05*   BNP: Invalid input(s): POCBNP D-Dimer: No results  for input(s): DDIMER in the last 72 hours. Hemoglobin A1C:  Recent Labs  11/24/16 0131  HGBA1C 11.2*   Fasting Lipid Panel:  Recent Labs  11/25/16 0324  CHOL 128  HDL 39*  LDLCALC 73  TRIG 80  CHOLHDL 3.3   Thyroid Function Tests: No results for input(s): TSH, T4TOTAL, T3FREE, THYROIDAB in the last 72 hours.  Invalid input(s): FREET3 Anemia Panel: No results for input(s): VITAMINB12, FOLATE, FERRITIN, TIBC, IRON, RETICCTPCT in the last 72 hours.  Dg Chest Port 1 View  Result Date: 11/24/2016 CLINICAL DATA:  Shortness of breath EXAM: PORTABLE CHEST 1 VIEW COMPARISON:  04/20/2016 FINDINGS: Cardiac pacemaker. Normal heart size and pulmonary vascularity. No focal airspace disease or consolidation in the lungs. No blunting of costophrenic angles. No pneumothorax. Mediastinal contours appear intact. IMPRESSION: No active disease. Electronically Signed   By: Lucienne Capers M.D.   On: 11/24/2016 01:42     Echo: Severly reduced systolic function, LVEF [id number], thinning and akinesis of anterior wall, anteroseptal wall, and apex with mild to moderate MR   TELEMETRY: sinus rhythm, rate 93  bpm  ASSESSMENT AND PLAN:  Principal Problem:   Acute on chronic systolic heart failure (HCC) Active Problems:   Shortness of breath   Abnormal EKG    1. Acute on chronic systolic congestive heart failure, improvement in symptoms after initial diuresis. 2. Moderate in-stent restenosis ostial LAD by urgent cardiac catheterization 11/23/2016, treated medically as it was felt to be non occlusive. Patient denies chest pain. 3. Ischemic cardiomyopathy with an LVEF 25-30% 4. Type 1 diabetes  Recommendations: 1. Agree with current therapy.  2. Continue aspirin and Plavix  3. Plan to discharge with follow-up with Dr. Posey Pronto at [facility] as outpatient.  Clabe Seal, PA-C 11/25/2016 8:56 AM

## 2016-11-25 NOTE — Telephone Encounter (Signed)
Please schedule with next available appt with any provider. Thanks.

## 2016-11-25 NOTE — Care Management Note (Signed)
Case Management Note  Patient Details  Name: LONGINO ENGE MRN: [id number] Date of Birth: Jun 06, 1971   No needs identified by members of the care team  Expected Discharge Date:  11/25/16               Expected Discharge Plan:     In-House Referral:     Discharge planning Services     Post Acute Care Choice:    Choice offered to:     DME Arranged:    DME Agency:     HH Arranged:    [place] Agency:     Status of Service:  Completed, signed off  If discussed at [employer] of Stay Meetings, dates discussed:    Additional Comments:  Katrina Stack, RN 11/25/2016, 12:45 PM

## 2016-11-25 NOTE — Progress Notes (Signed)
[place] at [place] was admitted to the Hospital on 11/23/2016 and Discharged  11/25/2016 and should be excused from work/school   for4 days starting 11/23/2016 , may return to work/school without any restrictions.  Call Dustin Flock MD with questions.  Dustin Flock M.D on 11/25/2016,at 12:16 PM  [place] at [facility]  [phone]

## 2016-11-25 NOTE — Discharge Summary (Signed)
[facility], [place] y.o., DOB March 13, 1971, MRN [id number]. Admission date: 11/23/2016 Discharge Date 11/25/2016 Primary MD [facility] Admitting Physician Nelva Bush, MD  Admission Diagnosis  Shortness of breath [R06.02] ST elevation myocardial infarction (STEMI), unspecified artery [facility]) [I21.3]  Discharge Diagnosis   Principal Problem:   Acute on chronic systolic heart failure (HCC)   Shortness of breath  Coronary artery disease with in-stent stenosis Diabetes type 2 Essential hypertension       Hospital Course   Joseph [name]  is a 46 y.o. male with a known history of  Diabetes type 2, essential hypertension, hyperlipidemia, ischemic cardiomyopathy, previous history of MI with a stent in 2011 at [facility] as well as chronic systolic CHF who presented to the ED yesterday with acute onset of shortness of breath at 10:30 PM last night. Patient came to the ER and was noted to have respiratory distress. Patient was markedly hypertensive with EKG demonstrating sinus tachycardia and right bundle branch block with 1-2 mm ST segment elevation in V2 through V4. He was placed on initially BiPAP and given nitroglycerin with improvement in his dyspnea. Patient did not have any chest pain however due to his EKG findings he was taken emergently for cardiac catheterization. The catheterization that demonstrated moderate in-stent restenosis of the osteolysis last proximal LAD stent. Patient was diuresed with resolution of his shortness of breath. Patient does have signs and symptoms of sleep apnea.            Consults  cardiology  Significant Tests:  See full reports for all details     Dg Chest Port 1 View  Result Date: 11/24/2016 CLINICAL DATA:  Shortness of breath EXAM: PORTABLE CHEST 1 VIEW COMPARISON:  04/20/2016 FINDINGS: Cardiac pacemaker. Normal heart size and pulmonary vascularity. No focal airspace disease or  consolidation in the lungs. No blunting of costophrenic angles. No pneumothorax. Mediastinal contours appear intact. IMPRESSION: No active disease. Electronically Signed   By: Lucienne Capers M.D.   On: 11/24/2016 01:42       Today   Subjective:   Joseph [name]  Pt  breathing is improved.  Objective:   Blood pressure (!) 150/89, pulse 76, temperature 98.4 F (36.9 C), temperature source Oral, resp. rate 18, height 6\' 1"  (1.854 m), weight 212 lb 11.9 oz (96.5 kg), SpO2 98 %.  .  Intake/Output Summary (Last 24 hours) at 11/25/16 1554 Last data filed at 11/25/16 1335  Gross per 24 hour  Intake              360 ml  Output              300 ml  Net               60 ml    Exam VITAL SIGNS: Blood pressure (!) 150/89, pulse 76, temperature 98.4 F (36.9 C), temperature source Oral, resp. rate 18, height 6\' 1"  (1.854 m), weight 212 lb 11.9 oz (96.5 kg), SpO2 98 %.  GENERAL:  46 y.o.-year-old patient lying in the bed with no acute distress.  EYES: Pupils equal, round, reactive to light and accommodation. No scleral icterus. Extraocular muscles intact.  HEENT: Head atraumatic, normocephalic. Oropharynx and nasopharynx clear.  NECK:  Supple, no jugular venous distention. No thyroid enlargement, no tenderness.  LUNGS: Normal breath sounds bilaterally, no wheezing, rales,rhonchi or crepitation. No use of accessory muscles of respiration.  CARDIOVASCULAR: S1, S2 normal. No murmurs,  rubs, or gallops.  ABDOMEN: Soft, nontender, nondistended. Bowel sounds present. No organomegaly or mass.  EXTREMITIES: No pedal edema, cyanosis, or clubbing.  NEUROLOGIC: Cranial nerves II through XII are intact. Muscle strength 5/5 in all extremities. Sensation intact. Gait not checked.  PSYCHIATRIC: The patient is alert and oriented x 3.  SKIN: No obvious rash, lesion, or ulcer.   Data Review     CBC w Diff: Lab Results  Component Value Date   WBC 10.1 11/23/2016   HGB 15.3 11/23/2016   HGB 13.5 01/15/2015    HCT 45.2 11/23/2016   HCT 40.9 01/15/2015   PLT 150 11/23/2016   PLT 140 (L) 01/15/2015   LYMPHOPCT 33 11/23/2016   LYMPHOPCT 24.3 01/15/2015   MONOPCT 11 11/23/2016   MONOPCT 6.9 01/15/2015   EOSPCT 6 11/23/2016   EOSPCT 0.2 01/15/2015   BASOPCT 0 11/23/2016   BASOPCT 0.6 01/15/2015   CMP: Lab Results  Component Value Date   NA 139 11/25/2016   NA 136 01/15/2015   K 3.5 11/25/2016   K 4.0 01/15/2015   CL 107 11/25/2016   CL 104 01/15/2015   CO2 26 11/25/2016   CO2 25 01/15/2015   BUN 16 11/25/2016   BUN 18 01/15/2015   CREATININE 0.75 11/25/2016   CREATININE 0.92 01/15/2015   PROT 7.0 11/23/2016   PROT 7.2 12/21/2011   ALBUMIN 3.6 11/23/2016   ALBUMIN 3.7 12/21/2011   BILITOT 0.7 11/23/2016   BILITOT 0.4 12/21/2011   ALKPHOS 103 11/23/2016   ALKPHOS 90 12/21/2011   AST 62 (H) 11/23/2016   AST 36 12/21/2011   ALT 51 11/23/2016   ALT 61 12/21/2011  .  Micro Results Recent Results (from the past 240 hour(s))  MRSA PCR Screening     Status: None   Collection Time: 11/24/16 12:46 AM  Result Value Ref Range Status   MRSA by PCR NEGATIVE NEGATIVE Final    Comment:        The GeneXpert MRSA Assay (FDA approved for NASAL specimens only), is one component of a comprehensive MRSA colonization surveillance program. It is not intended to diagnose MRSA infection nor to guide or monitor treatment for MRSA infections.         Code Status Orders        Start     Ordered   11/24/16 0050  Full code  Continuous     11/24/16 0049    Code Status History    Date Active Date Inactive Code Status Order ID Comments User Context   04/18/2016  2:20 PM 04/21/2016  4:13 PM Full Code [id number]  Idelle Crouch, MD Inpatient          Follow-up Information    [facility] Follow up in 7 day(s).   Specialty:  General Practice Why:  Wednesday, February 21st at 1pm, Architectural technologist information: [place]. [place]  [id number] [phone]        Isaias Cowman, MD Follow up in 2 week(s).   Specialty:  Cardiology Why:  Tuesday, February 20th at 915am, ccs Contact information: [facility] [place] [address] [phone]        Flora Lipps, MD Follow up.   Specialties:  Pulmonary Disease, Cardiology Why:  sleep apean evalutation,  Monday February 19th at [place], ccs Contact information: [place] [address] [phone]        Alisa Graff, FNP Follow up.  Specialty:  Family Medicine Why:  Needs appointment in clinic, ccs Contact information: [place] [address] [place] [address] [phone]           Discharge Medications   Allergies as of 11/25/2016      Reactions   Vancomycin Shortness Of Breath   Lisinopril Rash      Medication List    STOP taking these medications   amLODipine 5 MG tablet Commonly known as:  NORVASC   amoxicillin-clavulanate 875-125 MG tablet Commonly known as:  AUGMENTIN     TAKE these medications   aspirin EC 81 MG tablet Take 81 mg by mouth every morning.   atorvastatin 40 MG tablet Commonly known as:  LIPITOR Take 40 mg by mouth at bedtime.   carvedilol 12.5 MG tablet Commonly known as:  COREG Take 12.5 mg by mouth 2 (two) times daily.   clopidogrel 75 MG tablet Commonly known as:  PLAVIX Take 1 tablet (75 mg total) by mouth daily. Start taking on:  11/26/2016   digoxin 0.125 MG tablet Commonly known as:  LANOXIN Take 125 mcg by mouth daily.   fluticasone 50 MCG/ACT nasal spray Commonly known as:  FLONASE Place 2 sprays into both nostrils daily as needed. For allergies/rhinitis.   furosemide 20 MG tablet Commonly known as:  LASIX Take 1 tablet (20 mg total) by mouth 2 (two) times daily. What changed:  when to take this   HUMALOG KWIKPEN 100 UNIT/ML KiwkPen Generic drug:  insulin lispro Inject 16-20 Units into the skin 3  (three) times daily before meals. 16 units subcutaneous in the morning, 18 units subcutaneous at lunchtime, and 20 units subcutaneous at night. Plus sliding scale as directed.   LEVEMIR FLEXTOUCH 100 UNIT/ML Pen Generic drug:  Insulin Detemir Inject 13-30 Units into the skin See admin instructions. Inject 30 units subcutaneous in the morning, and Inject 13 units subcutaneous every night.   loratadine 10 MG tablet Commonly known as:  CLARITIN Take 1 tablet (10 mg total) by mouth daily.   losartan 100 MG tablet Commonly known as:  COZAAR Take 100 mg by mouth at bedtime.   metoCLOPramide 10 MG tablet Commonly known as:  REGLAN Take 10 mg by mouth at bedtime.   pantoprazole 40 MG tablet Commonly known as:  PROTONIX Take 1 tablet (40 mg total) by mouth daily.   potassium chloride 10 MEQ CR capsule Commonly known as:  MICRO-K Take 10 mEq by mouth daily.   sodium chloride 0.65 % Soln nasal spray Commonly known as:  OCEAN Place 1 spray into both nostrils as needed for congestion.   Vitamin D3 2000 units capsule Take 2,000 Units by mouth daily.          Total Time in preparing paper work, data evaluation and todays exam - 35 minutes  Dustin Flock M.D on 11/25/2016 at 3:54 PM  [facility]   Office  [phone]

## 2016-11-29 ENCOUNTER — Ambulatory Visit (INDEPENDENT_AMBULATORY_CARE_PROVIDER_SITE_OTHER): Payer: 59 | Admitting: Pulmonary Disease

## 2016-11-29 ENCOUNTER — Ambulatory Visit
Admission: RE | Admit: 2016-11-29 | Discharge: 2016-11-29 | Disposition: A | Discharge status: Home / Self Care | Payer: 59 | Source: Ambulatory Visit | Attending: Otolaryngology | Admitting: Otolaryngology

## 2016-11-29 ENCOUNTER — Other Ambulatory Visit: Payer: Self-pay | Admitting: Otolaryngology

## 2016-11-29 ENCOUNTER — Encounter: Payer: Self-pay | Admitting: Pulmonary Disease

## 2016-11-29 VITALS — BP 128/78 | HR 90 | Wt 215.0 lb

## 2016-11-29 DIAGNOSIS — R400 Somnolence: Secondary | ICD-10-CM | POA: Diagnosis not present

## 2016-11-29 DIAGNOSIS — R0683 Snoring: Secondary | ICD-10-CM

## 2016-11-29 DIAGNOSIS — J329 Chronic sinusitis, unspecified: Secondary | ICD-10-CM

## 2016-11-29 DIAGNOSIS — Z72821 Inadequate sleep hygiene: Secondary | ICD-10-CM | POA: Diagnosis not present

## 2016-11-29 DIAGNOSIS — J324 Chronic pansinusitis: Secondary | ICD-10-CM | POA: Diagnosis present

## 2016-11-29 NOTE — Progress Notes (Signed)
PULMONARY CONSULT NOTE  Requesting MD/Service: [facility] Date of initial consultation: 11/29/16 Reason for consultation: Consult request made after recent hospitalization for CHF exacerbation. Concern raised for OSA  PT PROFILE: 46 y.o. M with DM2, ischemic cardiomyopathy (LVEF 25-30% by echocardiogram 11/24/16) and recent hospitalization for AECHF. Concern was raised for OSA  HPI:  46 y.o. M with DM2, ischemic cardiomyopathy (LVEF 25-30% by echocardiogram 11/24/16) and recent hospitalization for AECHF when he presented with CP and SOB. He was found to have in stent re-stenosis that has been treated medically. Echocardiogram revealed Concern was raised for OSA. He reports snoring which often awakens him from sleep. He is not sure if his girlfriend has witnessed apneas. He works third shift and typically sleeps from 930 PM - 245 AM. He usually takes a 2-3 hr nap in the afternoon after returning home from work. He falls asleep very rapidly. He feels rested when he wakes. He denies HA upon waking. He does have ED hich he has attributed to DM. His Epworth score is 9.   Past Medical History:  Diagnosis Date  . Diabetes mellitus without complication ([place])   . Hyperlipidemia   . Hypertension   . Ischemic cardiomyopathy   . Myocardial infarction 2011   anterior MI at [place] s/p BMS to ostial/proximal LAD  . Systolic heart failure [facility])     Past Surgical History:  Procedure Laterality Date  . CARDIAC DEFIBRILLATOR PLACEMENT    . CARDIAC SURGERY    . LEFT HEART CATH AND CORONARY ANGIOGRAPHY N/A 11/23/2016   Procedure: Left Heart Cath and Coronary Angiography;  Surgeon: Nelva Bush, MD;  Location: [place] [facility];  Service: Cardiovascular;  Laterality: N/A;    MEDICATIONS: I have reviewed all medications and confirmed regimen as documented  Social History   Social History  . Marital status: Single    Spouse name: N/A  . Number of children: N/A  . Years of  education: N/A   Occupational History  . Not on file.   Social History Main Topics  . Smoking status: Never Smoker  . Smokeless tobacco: Never Used  . Alcohol use No  . Drug use: No  . Sexual activity: Yes   Other Topics Concern  . Not on file   Social History Narrative  . No narrative on file    Family History  Problem Relation Age of Onset  . Hypertension Mother   . Heart attack Paternal Grandmother     ROS: +nasal congestion No fever, myalgias/arthralgias, unexplained weight loss or weight gain No new focal weakness or sensory deficits No otalgia, hearing loss, visual changes, mouth and throat problems No neck pain or adenopathy No abdominal pain, N/V/D, diarrhea, change in bowel pattern No dysuria, change in urinary pattern   Vitals:   11/29/16 1014  BP: 128/78  Pulse: 90  SpO2: 100%  Weight: 215 lb (97.5 kg)     EXAM:  Gen: Obese, No overt respiratory distress HEENT: NCAT, sclera white, oropharynx normal, nasal passages normal Neck: Supple without LAN, thyromegaly, JVD Lungs: breath sounds full, percussion: normal, No adventitious sounds Cardiovascular: RRR, no murmurs noted Abdomen: Soft, nontender, normal BS Ext: without clubbing, cyanosis. + symmetric ankle edema Neuro: CNs grossly intact, motor and sensory intact Skin: Limited exam, no lesions noted  DATA:   BMP Latest Ref Rng & Units 11/25/2016 11/23/2016 04/19/2016  Glucose 65 - 99 mg/dL 227(H) 207(H) 200(H)  BUN 6 - 20 mg/dL 16 16 26(H)  Creatinine 0.61 -  1.24 mg/dL 0.75 0.94 1.09  Sodium 135 - 145 mmol/L 139 139 137  Potassium 3.5 - 5.1 mmol/L 3.5 4.6 3.9  Chloride 101 - 111 mmol/L 107 109 112(H)  CO2 22 - 32 mmol/L 26 23 20(L)  Calcium 8.9 - 10.3 mg/dL 8.0(L) 8.6(L) 7.7(L)    CBC Latest Ref Rng & Units 11/23/2016 04/19/2016 04/18/2016  WBC 3.8 - 10.6 K/uL 10.1 6.8 6.8  Hemoglobin 13.0 - 18.0 g/dL 15.3 14.4 16.0  Hematocrit 40.0 - 52.0 % 45.2 42.0 47.2  Platelets 150 - 440 K/uL 150 115(L)  124(L)    CXR 11/24/16:  AICD present, NACPD  IMPRESSION:     ICD-9-CM ICD-10-CM   1. Snoring 786.09 R06.83 Home sleep test  2. Daytime sleepiness 780.54 R40.0   3. Poor sleep hygiene 307.49 Z72.821    Poor hygiene due to work schedule  PLAN:  Home sleep study ordered We will call with results and decide on therapy I discussed the likely diagnosis of OSA and the possible treatment of CPAP Discussed sleep hygiene measures Follow up to be arranged   Merton Border, MD PCCM service Mobile [phone] Pager [phone] 11/29/2016

## 2016-11-30 ENCOUNTER — Encounter: Payer: Self-pay | Admitting: Internal Medicine

## 2016-11-30 DIAGNOSIS — R0683 Snoring: Secondary | ICD-10-CM

## 2016-12-01 DIAGNOSIS — G4733 Obstructive sleep apnea (adult) (pediatric): Secondary | ICD-10-CM

## 2016-12-02 ENCOUNTER — Encounter: Payer: Self-pay | Admitting: Emergency Medicine

## 2016-12-02 ENCOUNTER — Emergency Department: Payer: 59

## 2016-12-02 ENCOUNTER — Telehealth: Payer: Self-pay | Admitting: *Deleted

## 2016-12-02 ENCOUNTER — Emergency Department
Admission: EM | Admit: 2016-12-02 | Discharge: 2016-12-02 | Disposition: A | Discharge status: Home / Self Care | Payer: 59 | Attending: Emergency Medicine | Admitting: Emergency Medicine

## 2016-12-02 DIAGNOSIS — I5020 Unspecified systolic (congestive) heart failure: Secondary | ICD-10-CM | POA: Insufficient documentation

## 2016-12-02 DIAGNOSIS — Z79899 Other long term (current) drug therapy: Secondary | ICD-10-CM | POA: Insufficient documentation

## 2016-12-02 DIAGNOSIS — E119 Type 2 diabetes mellitus without complications: Secondary | ICD-10-CM | POA: Insufficient documentation

## 2016-12-02 DIAGNOSIS — Z794 Long term (current) use of insulin: Secondary | ICD-10-CM | POA: Diagnosis not present

## 2016-12-02 DIAGNOSIS — R42 Dizziness and giddiness: Secondary | ICD-10-CM | POA: Insufficient documentation

## 2016-12-02 DIAGNOSIS — R110 Nausea: Secondary | ICD-10-CM

## 2016-12-02 DIAGNOSIS — I110 Hypertensive heart disease with heart failure: Secondary | ICD-10-CM | POA: Insufficient documentation

## 2016-12-02 DIAGNOSIS — G4733 Obstructive sleep apnea (adult) (pediatric): Secondary | ICD-10-CM

## 2016-12-02 DIAGNOSIS — Z7982 Long term (current) use of aspirin: Secondary | ICD-10-CM | POA: Insufficient documentation

## 2016-12-02 LAB — BASIC METABOLIC PANEL
Anion gap: 5 (ref 5–15)
BUN: 12 mg/dL (ref 6–20)
CO2: 25 mmol/L (ref 22–32)
CREATININE: 0.73 mg/dL (ref 0.61–1.24)
Calcium: 8.5 mg/dL — ABNORMAL LOW (ref 8.9–10.3)
Chloride: 107 mmol/L (ref 101–111)
GFR calc Af Amer: >60 mL/min (ref >60)
GLUCOSE: 194 mg/dL — AB (ref 65–99)
Potassium: 3.8 mmol/L (ref 3.5–5.1)
SODIUM: 137 mmol/L (ref 135–145)

## 2016-12-02 LAB — CBC
HEMATOCRIT: 43 % (ref 40.0–52.0)
HEMOGLOBIN: 14.6 g/dL (ref 13.0–18.0)
MCH: 30 pg (ref 26.0–34.0)
MCHC: 33.9 g/dL (ref 32.0–36.0)
MCV: 88.4 fL (ref 80.0–100.0)
Platelets: 134 10*3/uL — ABNORMAL LOW (ref 150–440)
RBC: 4.86 MIL/uL (ref 4.40–5.90)
RDW: 13 % (ref 11.5–14.5)
WBC: 7.5 10*3/uL (ref 3.8–10.6)

## 2016-12-02 LAB — TROPONIN I: Troponin I: <0.03 ng/mL (ref <0.03)

## 2016-12-02 LAB — BRAIN NATRIURETIC PEPTIDE: B Natriuretic Peptide: 59 pg/mL (ref 0.0–100.0)

## 2016-12-02 MED ORDER — ACETAMINOPHEN 500 MG PO TABS
1000.0000 mg | ORAL_TABLET | Freq: Once | ORAL | Status: AC
Start: 2016-12-02 — End: 2016-12-02
  Administered 2016-12-02: 1000 mg via ORAL
  Filled 2016-12-02: qty 2

## 2016-12-02 NOTE — Telephone Encounter (Signed)
-----   Message from Laverle Hobby, MD sent at 12/01/2016  2:34 PM EST ----- Regarding: HST results.  HST showed AHI of 19.  Recommendations:  Given presence of ischemic cardiomyopathy /systolic CHF, would recommend in lab CPAP titration with switch to bipap titration if needed. If this is not covered by insurance would recommend Auto-CPAP titration with pressure range of 5-20 and close follow up of download data and evaluation for residual symptoms.

## 2016-12-02 NOTE — Telephone Encounter (Signed)
Tried to call pt but no answer and VM will not accept VMs at this time.

## 2016-12-02 NOTE — ED Triage Notes (Signed)
Pt to ED c/o feeling dizzy, nauseous this morning.  States took allergy medication at home last night for congestion.  Recently released from hospital, hx of heart attack 2011 and CHF.  Pt presents alert, chest rise even and unlabored, diaphoretic.

## 2016-12-02 NOTE — ED Provider Notes (Signed)
[facility] Emergency Department Provider Note  ____________________________________________  Time seen: Approximately 2:59 PM  I have reviewed the triage vital signs and the nursing notes.   HISTORY  Chief Complaint Dizziness; Nausea; and Congestive Heart Failure   HPI Joseph [name] is a 46 y.o. male h/o CHF (EF 25-30% on 11/2016), CAD, hypertension, hyperlipidemia, diabetes who presents for evaluationof dizziness and nausea. Patient reports that yesterday evening he started having allergies with rhinorrhea and sneezing. He took over-the-counter allergy medication. This morning he reports that he was nauseous and dry heaving. He went to work and was feeling lightheaded which prompted them send him home. he called his primary care doctor who recommended that he come to the emergency room. Patient was seen by his PCP yesterday for a follow-up appointment after being admitted last week for CHF exacerbation. According to patient that visit yesterday everything was great. He denies chest pain or shortness of breath, he doesn't have fever, chills, cough, nausea, vomiting, diarrhea. He does endorse mild frontal headache since his allergies started to get worse yesterday evening. Patient reports that he feels back to his baseline at this time. He denies having chest pain or palpitations earlier this morning when he was not feeling well. Also denies any shortness of breath at that time.  Past Medical History:  Diagnosis Date  . Diabetes mellitus without complication ([place])   . Hyperlipidemia   . Hypertension   . Ischemic cardiomyopathy   . Myocardial infarction 2011   anterior MI at [place] s/p BMS to ostial/proximal LAD  . Systolic heart failure [facility])     Patient Active Problem List   Diagnosis Date Noted  . Acute on chronic systolic heart failure ([place]) 11/24/2016  . Shortness of breath   . Abnormal EKG   . DKA (diabetic ketoacidoses) ([place]) 04/18/2016  . CAP  (community acquired pneumonia) 04/18/2016  . Accelerated hypertension 04/18/2016  . Abdominal pain 04/18/2016    Past Surgical History:  Procedure Laterality Date  . CARDIAC DEFIBRILLATOR PLACEMENT    . CARDIAC SURGERY    . LEFT HEART CATH AND CORONARY ANGIOGRAPHY N/A 11/23/2016   Procedure: Left Heart Cath and Coronary Angiography;  Surgeon: Nelva Bush, MD;  Location: [place] [facility];  Service: Cardiovascular;  Laterality: N/A;    Prior to Admission medications   Medication Sig Start Date End Date Taking? Authorizing Provider  aspirin EC 81 MG tablet Take 81 mg by mouth every morning.    Historical Provider, MD  atorvastatin (LIPITOR) 40 MG tablet Take 40 mg by mouth at bedtime. 02/09/16   Historical Provider, MD  carvedilol (COREG) 12.5 MG tablet Take 12.5 mg by mouth 2 (two) times daily. 02/16/16   Historical Provider, MD  Cholecalciferol (VITAMIN D3) 2000 units capsule Take 2,000 Units by mouth daily. 09/15/11   Historical Provider, MD  clopidogrel (PLAVIX) 75 MG tablet Take 1 tablet (75 mg total) by mouth daily. 11/26/16   Dustin Flock, MD  digoxin (LANOXIN) 0.125 MG tablet Take 125 mcg by mouth daily. 02/16/16   Historical Provider, MD  fluticasone (FLONASE) 50 MCG/ACT nasal spray Place 2 sprays into both nostrils daily as needed. For allergies/rhinitis. 01/26/16   Historical Provider, MD  furosemide (LASIX) 20 MG tablet Take 1 tablet (20 mg total) by mouth 2 (two) times daily. 11/25/16   Dustin Flock, MD  HUMALOG KWIKPEN 100 UNIT/ML KiwkPen Inject 16-20 Units into the skin 3 (three) times daily before meals. 16 units subcutaneous in the morning, 18  units subcutaneous at lunchtime, and 20 units subcutaneous at night. Plus sliding scale as directed. 03/09/16   Historical Provider, MD  loratadine (CLARITIN) 10 MG tablet Take 1 tablet (10 mg total) by mouth daily. 04/21/16   [name], MD  losartan (COZAAR) 100 MG tablet Take 100 mg by mouth at bedtime.    Historical  Provider, MD  metoCLOPramide (REGLAN) 10 MG tablet Take 10 mg by mouth at bedtime. 12/15/11   Historical Provider, MD  pantoprazole (PROTONIX) 40 MG tablet Take 1 tablet (40 mg total) by mouth daily. 04/21/16   [name], MD  potassium chloride (MICRO-K) 10 MEQ CR capsule Take 10 mEq by mouth daily. 02/23/16   Historical Provider, MD  sodium chloride (OCEAN) 0.65 % SOLN nasal spray Place 1 spray into both nostrils as needed for congestion. 04/21/16   [name], MD    Allergies Vancomycin and Lisinopril  Family History  Problem Relation Age of Onset  . Hypertension Mother   . Heart attack Paternal Grandmother     Social History Social History  Substance Use Topics  . Smoking status: Never Smoker  . Smokeless tobacco: Never Used  . Alcohol use No    Review of Systems  Constitutional: Negative for fever. + lightheadedness Eyes: Negative for visual changes. ENT: Negative for sore throat. + rhinorrhea Neck: No neck pain  Cardiovascular: Negative for chest pain. Respiratory: Negative for shortness of breath. Gastrointestinal: Negative for abdominal pain, vomiting or diarrhea. + nausea Genitourinary: Negative for dysuria. Musculoskeletal: Negative for back pain. Skin: Negative for rash. Neurological: Negative for weakness or numbness. + HA Psych: No SI or HI  ____________________________________________   PHYSICAL EXAM:  VITAL SIGNS: ED Triage Vitals  Enc Vitals Group     BP 12/02/16 1221 (!) 154/85     Pulse Rate 12/02/16 1221 73     Resp 12/02/16 1221 16     Temp 12/02/16 1221 98.4 F (36.9 C)     Temp Source 12/02/16 1221 Oral     SpO2 12/02/16 1221 98 %     Weight 12/02/16 1222 215 lb (97.5 kg)     Height 12/02/16 1222 6\' 1"  (1.854 m)     Head Circumference --      Peak Flow --      Pain Score 12/02/16 1431 2     Pain Loc --      Pain Edu? --      Excl. in [place]? --     Constitutional: Alert and oriented. Well appearing and in no apparent  distress. HEENT:      Head: Normocephalic and atraumatic.         Eyes: Conjunctivae are normal. Sclera is non-icteric. EOMI. PERRL      Mouth/Throat: Mucous membranes are moist.       Neck: Supple with no signs of meningismus. Cardiovascular: Regular rate and rhythm. No murmurs, gallops, or rubs. 2+ symmetrical distal pulses are present in all extremities. No JVD. Respiratory: Normal respiratory effort. Lungs are clear to auscultation bilaterally. No wheezes, crackles, or rhonchi.  Gastrointestinal: Soft, non tender, and non distended with positive bowel sounds. No rebound or guarding. Genitourinary: No CVA tenderness. Musculoskeletal: Nontender with normal range of motion in all extremities. No edema, cyanosis, or erythema of extremities. Neurologic: Normal speech and language. A & O x3, PERRL, no nystagmus, CN II-XII intact, motor testing reveals good tone and bulk throughout. There is no evidence of pronator drift or dysmetria. Muscle strength is 5/5 throughout. Deep tendon  reflexes are 2+ throughout with downgoing toes. Sensory examination is intact. Gait is normal. Skin: Skin is warm, dry and intact. No rash noted. Psychiatric: Mood and affect are normal. Speech and behavior are normal.  ____________________________________________   LABS (all labs ordered are listed, but only abnormal results are displayed)  Labs Reviewed  BASIC METABOLIC PANEL - Abnormal; Notable for the following:       Result Value   Glucose, Bld 194 (*)    Calcium 8.5 (*)    All other components within normal limits  CBC - Abnormal; Notable for the following:    Platelets 134 (*)    All other components within normal limits  TROPONIN I  BRAIN NATRIURETIC PEPTIDE  TROPONIN I   ____________________________________________  EKG  ED ECG REPORT I, Rudene Re, the attending physician, personally viewed and interpreted this ECG.  Normal sinus rhythm, rate of 66, right bundle branch block, normal  QTC, right axis deviation, no ST elevations or depressions. Unchanged from prior ____________________________________________  RADIOLOGY  CXR: negative  ____________________________________________   PROCEDURES  Procedure(s) performed: None Procedures Critical Care performed:  None ____________________________________________   INITIAL IMPRESSION / ASSESSMENT AND PLAN / ED COURSE  a 46 y.o. male h/o CHF (EF 25-30% on 11/2016), CAD, hypertension, hyperlipidemia, diabetes who presents for evaluationof dizziness and nausea earlier today in the setting of taking over-the-counter allergy medications including an antihistamine. Patient is now back to his baseline. Never had chest pain or shortness of breath. No flulike symptoms. EKG with no evidence of ischemia. Vital signs are stable. Exam with no acute findings. Blood work including CBC, BMP, troponin 1 all with no acute findings. Chest x-ray with no evidence of pulmonary edema or pneumonia. Will check 2nd troponin, give tylenol for HA and dc home if patient remains back to baseline.   ED COURSE:  Patient remained back to his baseline with no further complaints. Troponin 2 was negative. Patient was discharged home to follow up with his primary care doctor.  Pertinent labs & imaging results that were available during my care of the patient were reviewed by me and considered in my medical decision making (see chart for details).    ____________________________________________   FINAL CLINICAL IMPRESSION(S) / ED DIAGNOSES  Final diagnoses:  Nausea  Lightheadedness      NEW MEDICATIONS STARTED DURING THIS VISIT:  Discharge Medication List as of 12/02/2016  4:07 PM       Note:  This document was prepared using Dragon voice recognition software and may include unintentional dictation errors.    Rudene Re, MD 12/02/16 1726

## 2016-12-02 NOTE — ED Notes (Signed)
Signature pad not working.  Patient verbalized understanding of discharge instructions and has no further questions. 

## 2016-12-03 NOTE — Telephone Encounter (Signed)
Tried to call pt but VM full.

## 2016-12-08 ENCOUNTER — Encounter: Payer: Self-pay | Admitting: *Deleted

## 2016-12-08 NOTE — Telephone Encounter (Signed)
Tried to call pt and message states VM full and can't LM. Will send letter to patient. Nothing further needed at this time.

## 2016-12-24 NOTE — Telephone Encounter (Signed)
Patient returning call.  Will call back Monday .

## 2016-12-27 NOTE — Addendum Note (Signed)
Addended by: Oscar La R on: 12/27/2016 04:01 PM   Modules accepted: Orders

## 2016-12-27 NOTE — Telephone Encounter (Signed)
Pt called me in regards to the letter we mailed out due to being unable to get in touch with him. Pt informed of HST results. Will place order for in lab titration per DR's recs.

## 2017-02-03 ENCOUNTER — Encounter: Payer: Self-pay | Admitting: Internal Medicine

## 2017-02-03 ENCOUNTER — Ambulatory Visit: Payer: 59 | Attending: Internal Medicine

## 2017-02-03 DIAGNOSIS — G4733 Obstructive sleep apnea (adult) (pediatric): Secondary | ICD-10-CM

## 2017-02-03 DIAGNOSIS — R0683 Snoring: Secondary | ICD-10-CM | POA: Diagnosis present

## 2017-02-04 ENCOUNTER — Telehealth: Payer: Self-pay | Admitting: *Deleted

## 2017-02-04 DIAGNOSIS — G4733 Obstructive sleep apnea (adult) (pediatric): Secondary | ICD-10-CM | POA: Diagnosis not present

## 2017-02-04 NOTE — Telephone Encounter (Signed)
-----   Message from Laverle Hobby, MD sent at 02/04/2017  2:15 PM EDT ----- Regarding: CPAP titration study result.  Recommendation:  Auto CPAP with range of 7-12 cmH2O

## 2017-02-04 NOTE — Telephone Encounter (Signed)
Pt informed of results. Order placed for CPAP.

## 2017-02-24 ENCOUNTER — Emergency Department: Payer: 59

## 2017-02-24 ENCOUNTER — Emergency Department
Admission: EM | Admit: 2017-02-24 | Discharge: 2017-02-24 | Disposition: A | Discharge status: Home / Self Care | Payer: 59 | Attending: Emergency Medicine | Admitting: Emergency Medicine

## 2017-02-24 ENCOUNTER — Encounter: Payer: Self-pay | Admitting: Emergency Medicine

## 2017-02-24 DIAGNOSIS — Z7982 Long term (current) use of aspirin: Secondary | ICD-10-CM | POA: Insufficient documentation

## 2017-02-24 DIAGNOSIS — I110 Hypertensive heart disease with heart failure: Secondary | ICD-10-CM | POA: Insufficient documentation

## 2017-02-24 DIAGNOSIS — Z79899 Other long term (current) drug therapy: Secondary | ICD-10-CM | POA: Diagnosis not present

## 2017-02-24 DIAGNOSIS — E119 Type 2 diabetes mellitus without complications: Secondary | ICD-10-CM | POA: Diagnosis not present

## 2017-02-24 DIAGNOSIS — I5020 Unspecified systolic (congestive) heart failure: Secondary | ICD-10-CM | POA: Insufficient documentation

## 2017-02-24 DIAGNOSIS — R600 Localized edema: Secondary | ICD-10-CM | POA: Diagnosis not present

## 2017-02-24 DIAGNOSIS — R609 Edema, unspecified: Secondary | ICD-10-CM

## 2017-02-24 DIAGNOSIS — M7989 Other specified soft tissue disorders: Secondary | ICD-10-CM | POA: Diagnosis present

## 2017-02-24 LAB — BASIC METABOLIC PANEL
ANION GAP: 5 (ref 5–15)
BUN: 16 mg/dL (ref 6–20)
CALCIUM: 8.7 mg/dL — AB (ref 8.9–10.3)
CO2: 26 mmol/L (ref 22–32)
Chloride: 104 mmol/L (ref 101–111)
Creatinine, Ser: 0.69 mg/dL (ref 0.61–1.24)
Glucose, Bld: 272 mg/dL — ABNORMAL HIGH (ref 65–99)
POTASSIUM: 3.8 mmol/L (ref 3.5–5.1)
Sodium: 135 mmol/L (ref 135–145)

## 2017-02-24 LAB — TROPONIN I: Troponin I: <0.03 ng/mL (ref <0.03)

## 2017-02-24 LAB — CBC
HCT: 40.3 % (ref 40.0–52.0)
HEMOGLOBIN: 13.8 g/dL (ref 13.0–18.0)
MCH: 30.1 pg (ref 26.0–34.0)
MCHC: 34.2 g/dL (ref 32.0–36.0)
MCV: 87.8 fL (ref 80.0–100.0)
PLATELETS: 131 10*3/uL — AB (ref 150–440)
RBC: 4.58 MIL/uL (ref 4.40–5.90)
RDW: 13.4 % (ref 11.5–14.5)
WBC: 8.2 10*3/uL (ref 3.8–10.6)

## 2017-02-24 LAB — BRAIN NATRIURETIC PEPTIDE: B Natriuretic Peptide: 51 pg/mL (ref 0.0–100.0)

## 2017-02-24 NOTE — ED Triage Notes (Addendum)
Pt states he was seen a few weeks ago and told he had fluid in his lungs. Hx CHF, on Lasix. Pt states he has noticed mild increase in bil leg swelling and his friend told him he "felt tight" in abd area. +2/+3 edema noted to BLE. Denies chest pain, states he has been intermittently SOB.

## 2017-02-24 NOTE — ED Provider Notes (Signed)
[facility] Emergency Department Provider Note  ____________________________________________  Time seen: Approximately 4:12 PM  I have reviewed the triage vital signs and the nursing notes.   HISTORY  Chief Complaint Leg Swelling    HPI Joseph [name] is a 46 y.o. male with a history of diabetes and congestive heart failure (echo in 2015 EF of 30%)   presents to the emergency department with bilateral 2+ pitting edema . He has noticed edema for the past 2 days. Patient denies new shortness of breath, chest pain or chest tightness. He denies lower leg pain. Patient states that his primary care provider has recommended increasing his daily Lasix when bilateral leg swelling has occurred in the past. He denies prolonged immobility, smoking, recent surgery, history of DVT and PE. No history of personal malignancy. He has been afebrile. He presents to the emergency department for reassurance. No alleviating measures have been attempted.    Past Medical History:  Diagnosis Date  . Diabetes mellitus without complication ([place])   . Hyperlipidemia   . Hypertension   . Ischemic cardiomyopathy   . Myocardial infarction [facility]) 2011   anterior MI at [place] s/p BMS to ostial/proximal LAD  . Systolic heart failure [facility])     Patient Active Problem List   Diagnosis Date Noted  . Acute on chronic systolic heart failure ([place]) 11/24/2016  . Shortness of breath   . Abnormal EKG   . DKA (diabetic ketoacidoses) ([place]) 04/18/2016  . CAP (community acquired pneumonia) 04/18/2016  . Accelerated hypertension 04/18/2016  . Abdominal pain 04/18/2016    Past Surgical History:  Procedure Laterality Date  . CARDIAC DEFIBRILLATOR PLACEMENT    . CARDIAC SURGERY    . LEFT HEART CATH AND CORONARY ANGIOGRAPHY N/A 11/23/2016   Procedure: Left Heart Cath and Coronary Angiography;  Surgeon: Nelva Bush, MD;  Location: [place] CV LAB;  Service: Cardiovascular;  Laterality: N/A;     Prior to Admission medications   Medication Sig Start Date End Date Taking? Authorizing Provider  aspirin EC 81 MG tablet Take 81 mg by mouth every morning.    [provider]  atorvastatin (LIPITOR) 40 MG tablet Take 40 mg by mouth at bedtime. 02/09/16   [provider]  carvedilol (COREG) 12.5 MG tablet Take 12.5 mg by mouth 2 (two) times daily. 02/16/16   [provider]  Cholecalciferol (VITAMIN D3) 2000 units capsule Take 2,000 Units by mouth daily. 09/15/11   [provider]  clopidogrel (PLAVIX) 75 MG tablet Take 1 tablet (75 mg total) by mouth daily. 11/26/16   Dustin Flock, MD  digoxin (LANOXIN) 0.125 MG tablet Take 125 mcg by mouth daily. 02/16/16   [provider]  fluticasone (FLONASE) 50 MCG/ACT nasal spray Place 2 sprays into both nostrils daily as needed. For allergies/rhinitis. 01/26/16   [provider]  furosemide (LASIX) 20 MG tablet Take 1 tablet (20 mg total) by mouth 2 (two) times daily. 11/25/16   Dustin Flock, MD  HUMALOG KWIKPEN 100 UNIT/ML KiwkPen Inject 16-20 Units into the skin 3 (three) times daily before meals. 16 units subcutaneous in the morning, 18 units subcutaneous at lunchtime, and 20 units subcutaneous at night. Plus sliding scale as directed. 03/09/16   [provider]  loratadine (CLARITIN) 10 MG tablet Take 1 tablet (10 mg total) by mouth daily. 04/21/16   [name], MD  losartan (COZAAR) 100 MG tablet Take 100 mg by mouth at bedtime.    [provider]  metoCLOPramide (  REGLAN) 10 MG tablet Take 10 mg by mouth at bedtime. 12/15/11   [provider]  pantoprazole (PROTONIX) 40 MG tablet Take 1 tablet (40 mg total) by mouth daily. 04/21/16   [name], MD  potassium chloride (MICRO-K) 10 MEQ CR capsule Take 10 mEq by mouth daily. 02/23/16   [provider]  sodium chloride (OCEAN) 0.65 % SOLN nasal spray Place 1 spray into both nostrils as needed for  congestion. 04/21/16   [name], MD    Allergies Vancomycin and Lisinopril  Family History  Problem Relation Age of Onset  . Hypertension Mother   . Heart attack Paternal Grandmother     Social History Social History  Substance Use Topics  . Smoking status: Never Smoker  . Smokeless tobacco: Never Used  . Alcohol use No     Review of Systems  Constitutional: No fever/chills Eyes: No visual changes. No discharge ENT: No upper respiratory complaints. Cardiovascular: no chest pain. Respiratory: no cough. No SOB. Gastrointestinal: No abdominal pain.  No nausea, no vomiting.  No diarrhea.  No constipation. Musculoskeletal: Negative for musculoskeletal pain. Skin: Patient has bilateral pitting edema of the lower extremities. Neurological: Negative for headaches, focal weakness or numbness.  ____________________________________________   PHYSICAL EXAM:  VITAL SIGNS: ED Triage Vitals [02/24/17 1346]  Enc Vitals Group     BP (!) 142/74     Pulse Rate 73     Resp 18     Temp 98.3 F (36.8 C)     Temp Source Oral     SpO2 97 %     Weight 214 lb (97.1 kg)     Height 6\' 1"  (1.854 m)     Head Circumference      Peak Flow      Pain Score      Pain Loc      Pain Edu?      Excl. in [place]?      Constitutional: Alert and oriented. Well appearing and in no acute distress. Eyes: Conjunctivae are normal. PERRL. EOMI. Head:  Cardiovascular: Normal rate, regular rhythm. Normal S1 and S2.  Good peripheral circulation. Respiratory: Normal respiratory effort without tachypnea or retractions. Lungs CTAB. Good air entry to the bases with no decreased or absent breath sounds. Gastrointestinal: Bowel sounds 4 quadrants. Soft and nontender to palpation. No guarding or rigidity. No palpable masses. No distention. No CVA tenderness. Musculoskeletal: Full range of motion to all extremities. No gross deformities appreciated. Neurologic:  Normal speech and language. No gross  focal neurologic deficits are appreciated.  Skin:  Patient has 2+ pitting edema of the lower shanks bilaterally. No erythema. No collateral superficial veins present. Psychiatric: Mood and affect are normal. Speech and behavior are normal. Patient exhibits appropriate insight and judgement.   ____________________________________________   LABS (all labs ordered are listed, but only abnormal results are displayed)  Labs Reviewed  BASIC METABOLIC PANEL - Abnormal; Notable for the following:       Result Value   Glucose, Bld 272 (*)    Calcium 8.7 (*)    All other components within normal limits  CBC - Abnormal; Notable for the following:    Platelets 131 (*)    All other components within normal limits  TROPONIN I  BRAIN NATRIURETIC PEPTIDE   ____________________________________________  EKG   ____________________________________________  RADIOLOGY Unk Pinto, personally viewed and evaluated these images (plain radiographs) as part of my medical decision making, as well as reviewing the written report  by the radiologist.  Dg Chest 2 View  Result Date: 02/24/2017 CLINICAL DATA:  Ankle swelling. Fluid in lungs in feb. Sob since then. Has defibulator, CHF, 2011 heart attack, nonsmoker. Shielded. EXAM: CHEST - 2 VIEW COMPARISON:  12/02/2016 FINDINGS: Left subclavian AICD stable.  Lungs clear.  No pneumothorax. Heart size and mediastinal contours are within normal limits. No effusion. Visualized bones unremarkable. IMPRESSION: No acute cardiopulmonary disease. Electronically Signed   By: Lucrezia Europe M.D.   On: 02/24/2017 14:32    ____________________________________________    PROCEDURES  Procedure(s) performed:    Procedures    Medications - No data to display   ____________________________________________   INITIAL IMPRESSION / ASSESSMENT AND PLAN / ED COURSE  Pertinent labs & imaging results that were available during my care of the patient were reviewed by  me and considered in my medical decision making (see chart for details).  Review of the [place] CSRS was performed in accordance of the [place] prior to dispensing any controlled drugs.     Assessment and plan: Peripheral edema Patient presents to the emergency department with a history of congestive heart failure and bilateral 2+ pitting edema for the past 2 days. Patient has a -1 score on Well's Criteria for DVT. Bilateral lower extremity ultrasounds are not warranted at this time. Patient denies chest pain, chest tightness, shortness of breath nausea and vomiting. Patient was advised to follow-up with primary care to discuss titration for Lasix. Patient voiced understanding regarding this recommendation. Vital signs are reassuring prior to discharge. All patient questions were answered.    ____________________________________________  FINAL CLINICAL IMPRESSION(S) / ED DIAGNOSES  Final diagnoses:  Peripheral edema      NEW MEDICATIONS STARTED DURING THIS VISIT:  New Prescriptions   No medications on file        This chart was dictated using voice recognition software/Dragon. Despite best efforts to proofread, errors can occur which can change the meaning. Any change was purely unintentional.    Lannie Fields, PA-C 02/24/17 1623    Harvest Dark, MD 02/24/17 [phone]

## 2017-02-24 NOTE — ED Provider Notes (Signed)
ED ECG REPORT I, Darel Hong, the attending physician, personally viewed and interpreted this ECG.  Date: 02/24/2017 Rate: 73 Rhythm: normal sinus rhythm QRS Axis: normal Intervals: normal ST/T Wave abnormalities: normal Conduction Disturbances: Right bundle branch block consistent with previous Narrative Interpretation: Abnormal but unchanged    Darel Hong, MD 02/24/17 1406

## 2017-02-24 NOTE — ED Notes (Signed)

## 2017-03-01 DIAGNOSIS — G4733 Obstructive sleep apnea (adult) (pediatric): Secondary | ICD-10-CM | POA: Insufficient documentation

## 2017-03-01 DIAGNOSIS — I10 Essential (primary) hypertension: Secondary | ICD-10-CM | POA: Insufficient documentation

## 2017-05-02 ENCOUNTER — Encounter: Payer: Self-pay | Admitting: Pulmonary Disease

## 2017-05-02 ENCOUNTER — Ambulatory Visit (INDEPENDENT_AMBULATORY_CARE_PROVIDER_SITE_OTHER): Payer: 59 | Admitting: Pulmonary Disease

## 2017-05-02 VITALS — BP 160/88 | HR 96 | Resp 16 | Ht 73.0 in | Wt 214.0 lb

## 2017-05-02 DIAGNOSIS — G4733 Obstructive sleep apnea (adult) (pediatric): Secondary | ICD-10-CM | POA: Diagnosis not present

## 2017-05-02 DIAGNOSIS — R0981 Nasal congestion: Secondary | ICD-10-CM | POA: Diagnosis not present

## 2017-05-02 DIAGNOSIS — Z9114 Patient's other noncompliance with medication regimen: Secondary | ICD-10-CM | POA: Diagnosis not present

## 2017-05-02 NOTE — Patient Instructions (Signed)
Continue CPAP with sleep Continue Flonase and Claritin Suggest reevaluation by ENT for nasal congestion impairing CPAP compliance  Follow-up one year

## 2017-05-02 NOTE — Progress Notes (Signed)
PULMONARY/SLEEP OFFICE FOLLOW UP NOTE  Requesting MD/Service: [facility] Date of initial consultation: 11/29/16 Reason for consultation: Consult request made after recent hospitalization for CHF exacerbation. Concern raised for OSA  PT PROFILE: 46 y.o. M with DM2, ischemic cardiomyopathy (LVEF 25-30% by echocardiogram 11/24/16) and recent hospitalization for AECHF. Concern was raised for OSA  DATA:  11/30/16 home sleep study: AHI 19/hr. recommend AutoSet CPAP titration 02/08/17 CPAP titration: Recommend AutoSet CPAP with range of 7-12 cm H2O 06/19-07/18/18 Compliance: Use 24/30 days. > 4 hrs 14/30 days.  SUBJ:  Has been started on CPAP since last visit. Compliance is marginal due to travel and irregular sleep habits/shift work. Wearing full face mask. Reports nasal congestion which sometimes impedes his tolerance of CPAP   Vitals:   05/02/17 1148 05/02/17 1152  BP:  (!) 160/88  Pulse:  96  Resp: 16   SpO2:  96%  Weight: 214 lb (97.1 kg)   Height: 6\' 1"  (1.854 m)      EXAM:  Gen: NAD HEENT: turbinates enlarged, R?L Neck: Supple without JVD Lungs: breath sounds full, no adventitious sounds Cardiovascular: Reg, no murmurs Abdomen: Soft, nontender, normal BS Ext: 1+ symmetric ankle edema Neuro: grossly intact  DATA:   BMP Latest Ref Rng & Units 02/24/2017 12/02/2016 11/25/2016  Glucose 65 - 99 mg/dL 272(H) 194(H) 227(H)  BUN 6 - 20 mg/dL 16 12 16   Creatinine 0.61 - 1.24 mg/dL 0.69 0.73 0.75  Sodium 135 - 145 mmol/L 135 137 139  Potassium 3.5 - 5.1 mmol/L 3.8 3.8 3.5  Chloride 101 - 111 mmol/L 104 107 107  CO2 22 - 32 mmol/L 26 25 26   Calcium 8.9 - 10.3 mg/dL 8.7(L) 8.5(L) 8.0(L)    CBC Latest Ref Rng & Units 02/24/2017 12/02/2016 11/23/2016  WBC 3.8 - 10.6 K/uL 8.2 7.5 10.1  Hemoglobin 13.0 - 18.0 g/dL 13.8 14.6 15.3  Hematocrit 40.0 - 52.0 % 40.3 43.0 45.2  Platelets 150 - 440 K/uL 131(L) 134(L) 150    CXR: NNF  IMPRESSION:     ICD-10-CM   1.  OSA (obstructive sleep apnea) G47.33   2. Suboptimal compliance with CPAP Z91.14   3. Chronic nasal congestion R09.81    Poor sleep hygiene due to work schedule  PLAN:  Continue CPAP with sleep Continue Flonase and Claritin Suggest reevaluation by ENT for nasal congestion impairing CPAP compliance  He has established with them already  Follow-up one year  Merton Border, MD PCCM service Mobile [phone] Pager [phone] 05/03/2017 9:01 PM

## 2017-06-15 ENCOUNTER — Ambulatory Visit: Payer: Self-pay

## 2017-06-27 ENCOUNTER — Ambulatory Visit: Payer: Self-pay

## 2017-07-18 ENCOUNTER — Other Ambulatory Visit
Admission: RE | Admit: 2017-07-18 | Discharge: 2017-07-18 | Disposition: A | Discharge status: Home / Self Care | Payer: 59 | Source: Ambulatory Visit | Attending: Student | Admitting: Student

## 2017-07-18 DIAGNOSIS — R197 Diarrhea, unspecified: Secondary | ICD-10-CM | POA: Insufficient documentation

## 2017-07-25 ENCOUNTER — Ambulatory Visit (INDEPENDENT_AMBULATORY_CARE_PROVIDER_SITE_OTHER): Payer: 59 | Admitting: Urology

## 2017-07-25 ENCOUNTER — Encounter: Payer: Self-pay | Admitting: Urology

## 2017-07-25 VITALS — BP 132/77 | HR 77 | Ht 73.0 in | Wt 217.2 lb

## 2017-07-25 DIAGNOSIS — N521 Erectile dysfunction due to diseases classified elsewhere: Secondary | ICD-10-CM | POA: Diagnosis not present

## 2017-07-25 DIAGNOSIS — E1169 Type 2 diabetes mellitus with other specified complication: Secondary | ICD-10-CM

## 2017-07-25 DIAGNOSIS — R972 Elevated prostate specific antigen [PSA]: Secondary | ICD-10-CM | POA: Diagnosis not present

## 2017-07-25 NOTE — Progress Notes (Signed)
07/25/2017 6:46 AM   Joseph [name] July 28, 1971 [id number]  Referring provider: Center, [facility] [place], [place] [address]  CC: Elevated PSA  HPI:  1 - Elevated PSA - PSA 6.1 03/2017 noted by PCP, checked for unclear indication at age 46 with advanced CV disease.  No FHX prostate cancer. DRE 07/2017 35gm smoth  2 - Erectile Dysfunction - on trimix by men's clinic x years for severe ED. He has advanced vascular disease. This is meeting his goals.   PMH sig for IDDM2 (A1c 11's), CAD/CHF (EF 30, on lasix, dig, nitro, plavix at age 46, but not limitig daily). His PCP is Beverlyn Roux MD with [facility].  Today "Joseph [name]" is seen as new patient for above. He is referred by Dr. Sherril Cong.    PMH: Past Medical History:  Diagnosis Date  . Diabetes mellitus without complication ([place])   . Hyperlipidemia   . Hypertension   . Ischemic cardiomyopathy   . Myocardial infarction [facility]) 2011   anterior MI at [place] s/p BMS to ostial/proximal LAD  . Systolic heart failure [facility])     Surgical History: Past Surgical History:  Procedure Laterality Date  . CARDIAC DEFIBRILLATOR PLACEMENT    . CARDIAC SURGERY    . LEFT HEART CATH AND CORONARY ANGIOGRAPHY N/A 11/23/2016   Procedure: Left Heart Cath and Coronary Angiography;  Surgeon: Nelva Bush, MD;  Location: [place] CV LAB;  Service: Cardiovascular;  Laterality: N/A;    Home Medications:  Allergies as of 07/25/2017      Reactions   Vancomycin Shortness Of Breath   Lisinopril Rash      Medication List       Accurate as of 07/25/17  6:46 AM. Always use your most recent med list.          aspirin EC 81 MG tablet Take 81 mg by mouth every morning.   atorvastatin 40 MG tablet Commonly known as:  LIPITOR Take 40 mg by mouth at bedtime.   carvedilol 12.5 MG tablet Commonly known as:  COREG Take 12.5 mg by mouth 2 (two) times daily.   clopidogrel 75 MG tablet Commonly known as:   PLAVIX Take 1 tablet (75 mg total) by mouth daily.   digoxin 0.125 MG tablet Commonly known as:  LANOXIN Take 125 mcg by mouth daily.   fluticasone 50 MCG/ACT nasal spray Commonly known as:  FLONASE Place 2 sprays into both nostrils daily as needed. For allergies/rhinitis.   furosemide 20 MG tablet Commonly known as:  LASIX Take 1 tablet (20 mg total) by mouth 2 (two) times daily.   HUMALOG KWIKPEN 100 UNIT/ML KiwkPen Generic drug:  insulin lispro Inject 16-20 Units into the skin 3 (three) times daily before meals. 16 units subcutaneous in the morning, 20 units subcutaneous at lunchtime, and 22 units subcutaneous at night. Plus sliding scale as directed.   LANTUS SOLOSTAR 100 UNIT/ML Solostar Pen Generic drug:  Insulin Glargine Inject 16-30 Units into the skin as directed. 30 units am 16 units pm   loratadine 10 MG tablet Commonly known as:  CLARITIN Take 1 tablet (10 mg total) by mouth daily.   losartan 100 MG tablet Commonly known as:  COZAAR Take 100 mg by mouth at bedtime.   metoCLOPramide 10 MG tablet Commonly known as:  REGLAN Take 10 mg by mouth at bedtime.   pantoprazole 40 MG tablet Commonly known as:  PROTONIX Take 1 tablet (40 mg total) by mouth daily.  potassium chloride 10 MEQ CR capsule Commonly known as:  MICRO-K Take 10 mEq by mouth daily.   sodium chloride 0.65 % Soln nasal spray Commonly known as:  OCEAN Place 1 spray into both nostrils as needed for congestion.   spironolactone 25 MG tablet Commonly known as:  ALDACTONE Take 12.5 mg by mouth daily.   Vitamin D3 2000 units capsule Take 2,000 Units by mouth daily.       Allergies:  Allergies  Allergen Reactions  . Vancomycin Shortness Of Breath  . Lisinopril Rash    Family History: Family History  Problem Relation Age of Onset  . Hypertension Mother   . Heart attack Paternal Grandmother     Social History:  reports that he has never smoked. He has never used smokeless tobacco.  He reports that he does not drink alcohol or use drugs.    Review of Systems  Gastrointestinal (upper)  : Negative for upper GI symptoms  Gastrointestinal (lower) : Negative for lower GI symptoms  Constitutional : Negative for symptoms  Skin: Negative for skin symptoms  Eyes: Negative for eye symptoms  Ear/Nose/Throat : Negative for Ear/Nose/Throat symptoms  Hematologic/Lymphatic: Negative for Hematologic/Lymphatic symptoms  Cardiovascular : Negative for cardiovascular symptoms  Respiratory : Negative for respiratory symptoms  Endocrine: Negative for endocrine symptoms  Musculoskeletal: Negative for musculoskeletal symptoms  Neurological: Negative for neurological symptoms  Psychologic: Negative for psychiatric symptoms   Physical Exam: There were no vitals taken for this visit.  Constitutional:  Alert and oriented, No acute distress. HEENT: [place] AT, moist mucus membranes.  Trachea midline, no masses. Cardiovascular: No clubbing, cyanosis, or edema. Respiratory: Normal respiratory effort, no increased work of breathing. GI: Abdomen is soft, nontender, nondistended, no abdominal masses GU: No CVA tenderness.  Skin: No rashes, bruises or suspicious lesions. Lymph: No cervical or inguinal adenopathy. Neurologic: Grossly intact, no focal deficits, moving all 4 extremities. Psychiatric: Normal mood and affect.  Laboratory Data: Lab Results  Component Value Date   WBC 8.2 02/24/2017   HGB 13.8 02/24/2017   HCT 40.3 02/24/2017   MCV 87.8 02/24/2017   PLT 131 (L) 02/24/2017    Lab Results  Component Value Date   CREATININE 0.69 02/24/2017    No results found for: PSA1  No results found for: TESTOSTERONE  Lab Results  Component Value Date   HGBA1C 11.2 (H) 11/24/2016    Urinalysis Lab Results  Component Value Date   APPEARANCEUR CLEAR (A) 04/18/2016   LEUKOCYTESUR NEGATIVE 04/18/2016   PROTEINUR 100 (A) 04/18/2016   GLUCOSEU >500 (A)  04/18/2016   RBCU 0-5 04/18/2016   BILIRUBINUR NEGATIVE 04/18/2016   NITRITE NEGATIVE 04/18/2016    Lab Results  Component Value Date   BACTERIA RARE (A) 04/18/2016    Pertinent Imaging: None  Assessment & Plan:    1. Elevated PSA -  Role of PSA based screening and natural history of prostate cancer discussed. Rec repeat free / total PSA with 3 days abstinance prior. If <4, then no further evaluation and no further PSA based screening. If remains sig elevated or higher, then cautiosly consdier biopsy to rule out very large volume or aggressive cancer.  2 - Erectile Dysfunction - agree with trimix. This is one of the safest options in his case.   RTC with PSA free / total.   Alexis Frock, [place] [address] [place], [place] [address] [phone]

## 2017-07-26 ENCOUNTER — Other Ambulatory Visit
Admission: RE | Admit: 2017-07-26 | Discharge: 2017-07-26 | Disposition: A | Discharge status: Home / Self Care | Payer: 59 | Source: Ambulatory Visit | Attending: Student | Admitting: Student

## 2017-07-26 DIAGNOSIS — R197 Diarrhea, unspecified: Secondary | ICD-10-CM | POA: Insufficient documentation

## 2017-07-26 LAB — C DIFFICILE QUICK SCREEN W PCR REFLEX
C Diff antigen: POSITIVE — AB
C Diff toxin: NEGATIVE

## 2017-07-26 LAB — GASTROINTESTINAL PANEL BY PCR, STOOL (REPLACES STOOL CULTURE)
ADENOVIRUS F40/41: NOT DETECTED
Astrovirus: NOT DETECTED
CAMPYLOBACTER SPECIES: NOT DETECTED
CRYPTOSPORIDIUM: NOT DETECTED
CYCLOSPORA CAYETANENSIS: NOT DETECTED
E. coli O157: NOT DETECTED
ENTEROTOXIGENIC E COLI (ETEC): NOT DETECTED
Entamoeba histolytica: NOT DETECTED
Enteroaggregative E coli (EAEC): NOT DETECTED
GIARDIA LAMBLIA: NOT DETECTED
Norovirus GI/GII: NOT DETECTED
PLESIMONAS SHIGELLOIDES: NOT DETECTED
Rotavirus A: NOT DETECTED
Salmonella species: NOT DETECTED
Sapovirus (I, II, IV, and V): NOT DETECTED
Shiga like toxin producing E coli (STEC): DETECTED — AB
Shigella/Enteroinvasive E coli (EIEC): NOT DETECTED
VIBRIO SPECIES: NOT DETECTED
Vibrio cholerae: NOT DETECTED
YERSINIA ENTEROCOLITICA: NOT DETECTED

## 2017-07-26 LAB — CLOSTRIDIUM DIFFICILE BY PCR: Toxigenic C. Difficile by PCR: POSITIVE — AB

## 2017-07-28 LAB — PANCREATIC ELASTASE, FECAL: Pancreatic Elastase-1, Stool: >500 ug Elast./g (ref >200)

## 2017-08-04 LAB — CALPROTECTIN, FECAL: Calprotectin, Fecal: 69 ug/g (ref 0–120)

## 2017-08-17 ENCOUNTER — Other Ambulatory Visit: Payer: Self-pay | Admitting: Student

## 2017-08-17 DIAGNOSIS — R1013 Epigastric pain: Secondary | ICD-10-CM

## 2017-08-17 DIAGNOSIS — R1011 Right upper quadrant pain: Secondary | ICD-10-CM

## 2017-08-18 LAB — MISCELLANEOUS TEST

## 2017-08-22 ENCOUNTER — Ambulatory Visit
Admission: RE | Admit: 2017-08-22 | Discharge: 2017-08-22 | Disposition: A | Discharge status: Home / Self Care | Payer: 59 | Source: Ambulatory Visit | Attending: Student | Admitting: Student

## 2017-08-22 DIAGNOSIS — R1013 Epigastric pain: Secondary | ICD-10-CM | POA: Insufficient documentation

## 2017-08-22 DIAGNOSIS — R1011 Right upper quadrant pain: Secondary | ICD-10-CM | POA: Insufficient documentation

## 2017-08-22 DIAGNOSIS — K219 Gastro-esophageal reflux disease without esophagitis: Secondary | ICD-10-CM | POA: Diagnosis not present

## 2017-09-08 ENCOUNTER — Other Ambulatory Visit: Payer: Self-pay | Admitting: Student

## 2017-09-08 DIAGNOSIS — R1084 Generalized abdominal pain: Secondary | ICD-10-CM

## 2017-09-08 DIAGNOSIS — R1011 Right upper quadrant pain: Secondary | ICD-10-CM

## 2017-09-21 ENCOUNTER — Other Ambulatory Visit: Payer: 59

## 2017-09-21 ENCOUNTER — Encounter: Payer: Self-pay | Admitting: Urology

## 2017-09-26 ENCOUNTER — Ambulatory Visit: Payer: 59 | Admitting: Urology

## 2017-10-10 ENCOUNTER — Encounter
Admission: RE | Admit: 2017-10-10 | Discharge: 2017-10-10 | Disposition: A | Discharge status: Home / Self Care | Payer: 59 | Source: Ambulatory Visit | Attending: Student | Admitting: Student

## 2017-10-10 ENCOUNTER — Ambulatory Visit
Admission: RE | Admit: 2017-10-10 | Discharge: 2017-10-10 | Disposition: A | Discharge status: Home / Self Care | Payer: 59 | Source: Ambulatory Visit | Attending: Student | Admitting: Student

## 2017-10-10 DIAGNOSIS — R1084 Generalized abdominal pain: Secondary | ICD-10-CM

## 2017-10-10 DIAGNOSIS — K838 Other specified diseases of biliary tract: Secondary | ICD-10-CM | POA: Insufficient documentation

## 2017-10-10 DIAGNOSIS — R933 Abnormal findings on diagnostic imaging of other parts of digestive tract: Secondary | ICD-10-CM | POA: Diagnosis not present

## 2017-10-10 DIAGNOSIS — R1011 Right upper quadrant pain: Secondary | ICD-10-CM | POA: Diagnosis present

## 2017-10-10 MED ORDER — TECHNETIUM TC 99M MEBROFENIN IV KIT
5.0000 | PACK | Freq: Once | INTRAVENOUS | Status: AC | PRN
  Administered 2017-10-10: 5.39 via INTRAVENOUS

## 2017-10-17 ENCOUNTER — Encounter: Payer: Self-pay | Admitting: Urology

## 2017-10-17 ENCOUNTER — Other Ambulatory Visit: Payer: 59

## 2017-10-21 ENCOUNTER — Ambulatory Visit: Payer: Self-pay | Admitting: General Surgery

## 2017-10-21 NOTE — H&P (Signed)
PATIENT PROFILE: Joseph [name] is a 47 y.o. male who presents to the Clinic for consultation at the request of Dr. Tiffany Kocher for evaluation of gallbladder dyskinesia.  PCP:  Deborha Payment, MD  HISTORY OF PRESENT ILLNESS: Joseph [name] reports having abdominal pain since 6 months ago. The patient refers that since 6 months ago he has been having a sharp epigastric pain that radiates to the right upper quadrant that is aggravated when he eats and when he bends down to put his shoes. Food makes to pain worse. Nothing really improves the pain. Patient has a complex history of heart disease including multiple myocardial infarction status post stent, stenosis of stent, currently on aspirin and Plavix, congestive hear failure with 25% EF, HTN, among other non cardiac complications such as sleep apnea, DM type 1.    PROBLEM LIST:         Problem List  Date Reviewed: 09/20/2017         Noted   Hypertension 03/01/2017   Obstructive sleep apnea on CPAP 03/01/2017   Overview    Sleep study 02/03/17      Erectile dysfunction associated with type 2 diabetes mellitus (CMS-HCC) 05/15/4626   DM w/o complication type I, uncontrolled (CMS-HCC) 08/20/2015   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CMS-HCC) 08/19/2015   Poorly controlled type 1 diabetes mellitus (CMS-HCC) 08/07/2015   Required emergent intubation 03/07/2014   LADA (latent autoimmune diabetes in adults), managed as type 1 (CMS-HCC) 09/15/2011   Implantable cardioverter-defibrillator (ICD) in situ 08/18/2011   Overview    [employer] Telegen E102 single chamber defibrillator model [id number] with a [id number] right ventricular lead serial [id number] implanted August 25, 2010 by Dr. Dwana Curd for primary prevention.      CAD (coronary artery disease) 08/18/2011   Overview    STEMI in 6/11 with PCI to the LAD with DES.      Hyperlipemia, unspecified 08/18/2011   GERD (gastroesophageal reflux disease) 08/18/2011   Erectile dysfunction  08/18/2011   Ischemic cardiomyopathy 08/18/2011   Overview    11/24/16 EF 25-30% 07/02/10 EF <35%; LVEDD 5.1 cm      MI (myocardial infarction) (CMS-HCC) 03/11/2010   Overview    DES ostial/proximal LAD, 03/2010         GENERAL REVIEW OF SYSTEMS:   General ROS: negative for - chills, fatigue, fever, weight gain or weight loss Allergy and Immunology ROS: negative for - hives  Hematological and Lymphatic ROS: negative for - bleeding problems or bruising, negative for palpable nodes Endocrine ROS: negative for - heat or cold intolerance, hair changes Respiratory ROS: negative for - cough, or wheezing. Positive for shortness of breath.  Cardiovascular ROS: no chest pain or palpitations GI ROS: See HPI for pertinent positives and negatives.  Musculoskeletal ROS: negative for - joint swelling or muscle pain Neurological ROS: negative for - confusion, syncope Dermatological ROS: negative for pruritus and rash Psychiatric: negative for anxiety, depression, difficulty sleeping and memory loss  MEDICATIONS: CurrentMedications        Current Outpatient Medications  Medication Sig Dispense Refill  . acetone, urine, test (ACETONE, URINE, TEST) strip use to ckeck for ketones in your urine if your blood sugar is over 250  12  . aspirin 81 mg tablet 1 tab by mouth daily (Patient taking differently: Take 1 tablet by mouth once daily. Cardiac stent x 1. Antiplatelet form in chart. )    . atorvastatin (LIPITOR) 40 MG tablet Take 1 tablet by mouth once daily.    Marland Kitchen  BD ULTRA-FINE SHORT PEN NEEDLE 31 gauge x 5/16" needle USE 5 TIMES DAILY AS  DIRECTED 500 each 3  . blood glucose diagnostic test strip Use 4 (four) times daily. Use as instructed. 400 each 3  . blood glucose meter kit Use as directed. 1 each 0  . carvedilol (COREG) 12.5 MG tablet Take 1 tablet by mouth once daily.    . cholecalciferol (VITAMIN D3) 2,000 unit capsule Take 1 capsule (2,000 Units total) by mouth daily.  With food 90 capsule 6  . clopidogrel (PLAVIX) 75 mg tablet Take by mouth once daily.      . digoxin (LANOXIN) 0.125 MG tablet TAKE ONE TABLET BY MOUTH EVERY DAY 90 tablet 3  . fluticasone (FLONASE) 50 mcg/actuation nasal spray 2 spray in each nostril daily    . FUROsemide (LASIX) 20 MG tablet Take by mouth 2 (two) times daily.      . insulin GLARGINE (LANTUS SOLOSTAR, BASAGLAR KWIKPEN) pen injector (concentration 100 units/mL) Inject 30 u in AM and 16 u in PM plus additional as needed for up to 60 units TDD 60 mL 3  . insulin LISPRO (HUMALOG KWIKPEN INSULIN) pen injector (concentration 100 units/mL) 16 units with breakfast 18 units with lunch 5 units with snack  20 units with dinner. Plus sliding scale. TDD 75 units 75 mL 3  . KLOR-CON M10 10 mEq ER tablet TAKE ONE TABLET BY MOUTH EVERY DAY 30 tablet 11  . loratadine (CLARITIN) 10 mg capsule Take 10 mg by mouth once daily.    Marland Kitchen losartan (COZAAR) 100 MG tablet Take by mouth    . metoclopramide (REGLAN) 10 MG tablet TAKE ONE TABLET BY MOUTH 4 TIMES DAILY BEFORE  MEALS 120 tablet PRN  . pantoprazole (PROTONIX) 20 MG DR tablet Take 20 mg by mouth once daily.    . pen needle, diabetic 31 gauge x 3/16" needle Use 1 Syringe as directed. Use 1 pen needle per injection 5-6 times a day 200 each 3  . spironolactone (ALDACTONE) 25 MG tablet Take 0.5 tablets (12.5 mg total) by mouth once daily. 90 tablet 3   No current facility-administered medications for this visit.       ALLERGIES: Vancomycin and Lisinopril  PAST MEDICAL HISTORY:     Past Medical History:  Diagnosis Date  . Acute myocardial infarction of anterolateral wall, initial episode of care (CMS-HCC) 2011   stent x 1   . AICD (automatic cardioverter/defibrillator) present   . CAD (coronary artery disease) 08/18/2011   seen by cardiology on 02/26/2016; clear for surgery.. patient denies CP or SOB.   . Cardiomyopathy (CMS-HCC) 2011   at [facility] EF< 35 %  . CHF  (congestive heart failure) (CMS-HCC)   . Diabetes mellitus type I (CMS-HCC)   . Erectile dysfunction 08/18/2011  . GERD (gastroesophageal reflux disease) 08/18/2011  . Hyperlipemia, unspecified 08/18/2011  . Hypertension   . Ischemic cardiomyopathy 08/18/2011  . LADA (latent autoimmune diabetes in adults), managed as type 1 (CMS-HCC)   . MI (myocardial infarction) (CMS-HCC) 03/2010   with stent x1. Denies CP or SOB since 2011  . Presence of automatic implantable cardioverter-defibrillator    [employer]. Model # [id number], serial # [id number]  . Vitamin D deficiency, unspecified     PAST SURGICAL HISTORY:      Past Surgical History:  Procedure Laterality Date  .  CARDIAC DEFIBRILLATOR  08/2010   [employer] ICD 08/25/2010 AT [facility]  . CARDIAC DEFIBRILLATOR PLACEMENT  03/2010  with stent x1  . ORAL SURGERY OPERATIVE NOTE  2007  . WRIST SURGERY Right 08/2007     FAMILY HISTORY:      Family History  Problem Relation Age of Onset  . High blood pressure (Hypertension) Mother   . Reflux disease Sister   . Dementia Maternal Grandmother   . Arthritis Maternal Grandmother   . Diabetes Maternal Grandfather   . High blood pressure (Hypertension) Paternal Grandmother   . Arthritis Paternal Grandmother   . Myocardial Infarction (Heart attack) Paternal Grandmother   . Sudden death (unexpected death due to unknown cause) Neg Hx      SOCIAL HISTORY: Social History        Socioeconomic History  . Marital status: Divorced    Spouse name: None  . Number of children: None  . Years of education: 3  . Highest education level: None  Social Needs  . Financial resource strain: None  . Food insecurity - worry: None  . Food insecurity - inability: None  . Transportation needs - medical: None  . Transportation needs - non-medical: None  Occupational History  . None  Tobacco Use  . Smoking status: Never Smoker  . Smokeless tobacco:  Never Used  Substance and Sexual Activity  . Alcohol use: No    Alcohol/week: 0.0 oz  . Drug use: No  . Sexual activity: Yes  Other Topics Concern  . 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    PHYSICAL EXAM:    Vitals:   10/10/17 1340  BP: 138/78  Pulse: 84  Temp: 37.3 C (99.2 F)   Body mass index is 28.81 kg/m. Weight: 99.1 kg (218 lb 6.4 oz)   General Appearance:    Alert, cooperative, no distress, appears stated age  Head:     Atraumatic, normocephalic  Eyes:   Anciteric, no erythema, no secretions  Neck:   Supple, symmetrical, no JVD, no palpable lymph nodes  Mouth:   Lips, mucosa, and tongue normal;   Lungs:     Clear to auscultation bilaterally, respirations unlabored   Heart:    Regular rate and rhythm, S1 and S2 normal, no murmur, rub   or gallop  Abdomen:     Soft, non-tender, bowel sounds active all four quadrants,    no masses, no organomegaly  Extremities:   Extremities normal, atraumatic, no cyanosis or edema  Skin:   Skin color, texture, turgor normal, no rashes or lesions   Neurologic:   Grossly intact.    REVIEW OF DATA: I have reviewed the following data today:      Office Visit on 08/16/2017  Component Date Value  . WBC (White Blood Cell Co* 08/16/2017 9.0   . RBC (Red Blood Cell Coun* 08/16/2017 4.65*  . Hemoglobin 08/16/2017 14.0*  . Hematocrit 08/16/2017 41.7   . MCV (Mean Corpuscular Vo* 08/16/2017 89.7   . MCH (Mean Corpuscular He* 08/16/2017 30.1   . MCHC (Mean Corpuscular H* 08/16/2017 33.6   . Platelet Count 08/16/2017 166   . RDW-CV (Red Cell Distrib* 08/16/2017 12.5   . MPV (Mean Platelet Volum* 08/16/2017 10.8   . Neutrophils 08/16/2017 4.36   . Lymphocytes 08/16/2017 3.26   . Monocytes 08/16/2017 0.78   . Eosinophils 08/16/2017 0.57*  . Basophils 08/16/2017 0.01   . Neutrophil % 08/16/2017 48.5   . Lymphocyte % 08/16/2017 36.3   . Monocyte % 08/16/2017  8.7   .  Eosinophil % 08/16/2017 6.3*  . Basophil% 08/16/2017 0.1   . Immature Granulocyte % 08/16/2017 0.1   . Immature Granulocyte Cou* 08/16/2017 0.01   . Glucose 08/16/2017 142*  . Sodium 08/16/2017 139   . Potassium 08/16/2017 3.9   . Chloride 08/16/2017 106   . Carbon Dioxide (CO2) 08/16/2017 27.0   . Urea Nitrogen (BUN) 08/16/2017 18   . Creatinine 08/16/2017 0.8   . Glomerular Filtration Ra* 08/16/2017 126   . Calcium 08/16/2017 9.0   . AST  08/16/2017 36   . ALT  08/16/2017 53   . Alk Phos (alkaline Phosp* 08/16/2017 86   . Albumin 08/16/2017 3.8   . Bilirubin, Total 08/16/2017 0.5   . Protein, Total 08/16/2017 7.1   . A/G Ratio 08/16/2017 1.2   . Amylase 08/16/2017 60   Office Visit on 08/01/2017  Component Date Value  . POC Glucose 08/01/2017 215*  . POC Hemoglobin A1C (Glyc* 08/01/2017 10.8*  . Average Blood Glucose 08/01/2017 276   Initial consult on 07/11/2017  Component Date Value  . H pylori Breath Test - L* 07/11/2017 Negative   . Endomysial Antibody IgA * 07/11/2017 Negative   . t-Transglutaminase (tTG)* 07/11/2017 <2   . Immunoglobulin A, Qn, Se* 07/11/2017 215   . C Reactive Protein - Lab* 07/11/2017 4.6   . Sedimentation Rate-Autom* 07/11/2017 26*    Abdominal ultrasound images were reviewed. No gallstones, no gallbladder wall thickening, no pericholecystic fluid.   ASSESSMENT: Joseph [name] is a 47 y.o. male presenting for consultation for gallbladder dyskinesia. Patient with symptoms that can be explained by the gallbladder low ejection fraction of 12%, such as the epigastric pain that radiates to the right upper quadrant associated with food intake. Patient was oriented about the gallbladder function and the diagnosis of gallbladder dyskinesia. Also oriented about the recommendations of gallbladder removal which in his case are not as clear as with patient with gallstones.  Patient was oriented that some patient with low ejection fraction on HIDA scan, does  not improve after cholecystectomy and that is very important to understand when considering surgery. He still want to proceed with surgery since this is causing pain almost every day after eating. Patient also with a very complicated cardiac history that was cleared by Cardiologist and Pulmonology. Cardiology agree to hold Plavix before surgery.   PLAN: 1.Cardiology clearance with specifications of Plavix management - Done 2.Pulmonologist clearance - Done 3.Avoid fatty or spicy food 4. Laparoscopic vs open cholecystectomy.   Patient verbalized understanding, all questions were answered, and were agreeable with the plan outlined above.   Joseph Pun, MD  Electronically signed by Joseph Pun, MD

## 2017-10-21 NOTE — H&P (View-Only) (Signed)
PATIENT PROFILE: Joseph [name] is a 47 y.o. male who presents to the Clinic for consultation at the request of Dr. Tiffany Kocher for evaluation of gallbladder dyskinesia.  PCP:  Deborha Payment, MD  HISTORY OF PRESENT ILLNESS: Joseph [name] having abdominal pain since 6 months ago. The patient refers that since 6 months ago he has been having a sharp epigastric pain that radiates to the right upper quadrant that is aggravated when he eats and when he bends down to put his shoes. Food makes to pain worse. Nothing really improves the pain. Patient has a complex history of heart disease including multiple myocardial infarction status post stent, stenosis of stent, currently on aspirin and Plavix, congestive hear failure with 25% EF, HTN, among other non cardiac complications such as sleep apnea, DM type 1.    PROBLEM LIST:         Problem List  Date Reviewed: 09/20/2017         Noted   Hypertension 03/01/2017   Obstructive sleep apnea on CPAP 03/01/2017   Overview    Sleep study 02/03/17      Erectile dysfunction associated with type 2 diabetes mellitus (CMS-HCC) 01/17/1855   DM w/o complication type I, uncontrolled (CMS-HCC) 08/20/2015   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CMS-HCC) 08/19/2015   Poorly controlled type 1 diabetes mellitus (CMS-HCC) 08/07/2015   Required emergent intubation 03/07/2014   LADA (latent autoimmune diabetes in adults), managed as type 1 (CMS-HCC) 09/15/2011   Implantable cardioverter-defibrillator (ICD) in situ 08/18/2011   Overview    [employer] Telegen E102 single chamber defibrillator model [id number] with a [id number] right ventricular lead serial [id number] implanted August 25, 2010 by Dr. Dwana Curd for primary prevention.      CAD (coronary artery disease) 08/18/2011   Overview    STEMI in 6/11 with PCI to the LAD with DES.      Hyperlipemia, unspecified 08/18/2011   GERD (gastroesophageal reflux disease) 08/18/2011   Erectile dysfunction  08/18/2011   Ischemic cardiomyopathy 08/18/2011   Overview    11/24/16 EF 25-30% 07/02/10 EF <35%; LVEDD 5.1 cm      MI (myocardial infarction) (CMS-HCC) 03/11/2010   Overview    DES ostial/proximal LAD, 03/2010         GENERAL REVIEW OF SYSTEMS:   General ROS: negative for - chills, fatigue, fever, weight gain or weight loss Allergy and Immunology ROS: negative for - hives  Hematological and Lymphatic ROS: negative for - bleeding problems or bruising, negative for palpable nodes Endocrine ROS: negative for - heat or cold intolerance, hair changes Respiratory ROS: negative for - cough, or wheezing. Positive for shortness of breath.  Cardiovascular ROS: no chest pain or palpitations GI ROS: See HPI for pertinent positives and negatives.  Musculoskeletal ROS: negative for - joint swelling or muscle pain Neurological ROS: negative for - confusion, syncope Dermatological ROS: negative for pruritus and rash Psychiatric: negative for anxiety, depression, difficulty sleeping and memory loss  MEDICATIONS: CurrentMedications        Current Outpatient Medications  Medication Sig Dispense Refill  . acetone, urine, test (ACETONE, URINE, TEST) strip use to ckeck for ketones in your urine if your blood sugar is over 250  12  . aspirin 81 mg tablet 1 tab by mouth daily (Patient taking differently: Take 1 tablet by mouth once daily. Cardiac stent x 1. Antiplatelet form in chart. )    . atorvastatin (LIPITOR) 40 MG tablet Take 1 tablet by mouth once daily.    Marland Kitchen  BD ULTRA-FINE SHORT PEN NEEDLE 31 gauge x 5/16" needle USE 5 TIMES DAILY AS  DIRECTED 500 each 3  . blood glucose diagnostic test strip Use 4 (four) times daily. Use as instructed. 400 each 3  . blood glucose meter kit Use as directed. 1 each 0  . carvedilol (COREG) 12.5 MG tablet Take 1 tablet by mouth once daily.    . cholecalciferol (VITAMIN D3) 2,000 unit capsule Take 1 capsule (2,000 Units total) by mouth daily.  With food 90 capsule 6  . clopidogrel (PLAVIX) 75 mg tablet Take by mouth once daily.      . digoxin (LANOXIN) 0.125 MG tablet TAKE ONE TABLET BY MOUTH EVERY DAY 90 tablet 3  . fluticasone (FLONASE) 50 mcg/actuation nasal spray 2 spray in each nostril daily    . FUROsemide (LASIX) 20 MG tablet Take by mouth 2 (two) times daily.      . insulin GLARGINE (LANTUS SOLOSTAR, BASAGLAR KWIKPEN) pen injector (concentration 100 units/mL) Inject 30 u in AM and 16 u in PM plus additional as needed for up to 60 units TDD 60 mL 3  . insulin LISPRO (HUMALOG KWIKPEN INSULIN) pen injector (concentration 100 units/mL) 16 units with breakfast 18 units with lunch 5 units with snack  20 units with dinner. Plus sliding scale. TDD 75 units 75 mL 3  . KLOR-CON M10 10 mEq ER tablet TAKE ONE TABLET BY MOUTH EVERY DAY 30 tablet 11  . loratadine (CLARITIN) 10 mg capsule Take 10 mg by mouth once daily.    Marland Kitchen losartan (COZAAR) 100 MG tablet Take by mouth    . metoclopramide (REGLAN) 10 MG tablet TAKE ONE TABLET BY MOUTH 4 TIMES DAILY BEFORE  MEALS 120 tablet PRN  . pantoprazole (PROTONIX) 20 MG DR tablet Take 20 mg by mouth once daily.    . pen needle, diabetic 31 gauge x 3/16" needle Use 1 Syringe as directed. Use 1 pen needle per injection 5-6 times a day 200 each 3  . spironolactone (ALDACTONE) 25 MG tablet Take 0.5 tablets (12.5 mg total) by mouth once daily. 90 tablet 3   No current facility-administered medications for this visit.       ALLERGIES: Vancomycin and Lisinopril  PAST MEDICAL HISTORY:     Past Medical History:  Diagnosis Date  . Acute myocardial infarction of anterolateral wall, initial episode of care (CMS-HCC) 2011   stent x 1   . AICD (automatic cardioverter/defibrillator) present   . CAD (coronary artery disease) 08/18/2011   seen by cardiology on 02/26/2016; clear for surgery.. patient denies CP or SOB.   . Cardiomyopathy (CMS-HCC) 2011   at [facility] EF< 35 %  . CHF  (congestive heart failure) (CMS-HCC)   . Diabetes mellitus type I (CMS-HCC)   . Erectile dysfunction 08/18/2011  . GERD (gastroesophageal reflux disease) 08/18/2011  . Hyperlipemia, unspecified 08/18/2011  . Hypertension   . Ischemic cardiomyopathy 08/18/2011  . LADA (latent autoimmune diabetes in adults), managed as type 1 (CMS-HCC)   . MI (myocardial infarction) (CMS-HCC) 03/2010   with stent x1. Denies CP or SOB since 2011  . Presence of automatic implantable cardioverter-defibrillator    [employer]. Model # [id number], serial # [id number]  . Vitamin D deficiency, unspecified     PAST SURGICAL HISTORY:      Past Surgical History:  Procedure Laterality Date  .  CARDIAC DEFIBRILLATOR  08/2010   [employer] ICD 08/25/2010 AT [facility]  . CARDIAC DEFIBRILLATOR PLACEMENT  03/2010  with stent x1  . ORAL SURGERY OPERATIVE NOTE  2007  . WRIST SURGERY Right 08/2007     FAMILY HISTORY:      Family History  Problem Relation Age of Onset  . High blood pressure (Hypertension) Mother   . Reflux disease Sister   . Dementia Maternal Grandmother   . Arthritis Maternal Grandmother   . Diabetes Maternal Grandfather   . High blood pressure (Hypertension) Paternal Grandmother   . Arthritis Paternal Grandmother   . Myocardial Infarction (Heart attack) Paternal Grandmother   . Sudden death (unexpected death due to unknown cause) Neg Hx      SOCIAL HISTORY: Social History        Socioeconomic History  . Marital status: Divorced    Spouse name: None  . Number of children: None  . Years of education: 69  . Highest education level: None  Social Needs  . Financial resource strain: None  . Food insecurity - worry: None  . Food insecurity - inability: None  . Transportation needs - medical: None  . Transportation needs - non-medical: None  Occupational History  . None  Tobacco Use  . Smoking status: Never Smoker  . Smokeless tobacco:  Never Used  Substance and Sexual Activity  . Alcohol use: No    Alcohol/week: 0.0 oz  . Drug use: No  . Sexual activity: Yes  Other Topics Concern  . 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    PHYSICAL EXAM:    Vitals:   10/10/17 1340  BP: 138/78  Pulse: 84  Temp: 37.3 C (99.2 F)   Body mass index is 28.81 kg/m. Weight: 99.1 kg (218 lb 6.4 oz)   General Appearance:    Alert, cooperative, no distress, appears stated age  Head:     Atraumatic, normocephalic  Eyes:   Anciteric, no erythema, no secretions  Neck:   Supple, symmetrical, no JVD, no palpable lymph nodes  Mouth:   Lips, mucosa, and tongue normal;   Lungs:     Clear to auscultation bilaterally, respirations unlabored   Heart:    Regular rate and rhythm, S1 and S2 normal, no murmur, rub   or gallop  Abdomen:     Soft, non-tender, bowel sounds active all four quadrants,    no masses, no organomegaly  Extremities:   Extremities normal, atraumatic, no cyanosis or edema  Skin:   Skin color, texture, turgor normal, no rashes or lesions   Neurologic:   Grossly intact.    REVIEW OF DATA: I have reviewed the following data today:      Office Visit on 08/16/2017  Component Date Value  . WBC (White Blood Cell Co* 08/16/2017 9.0   . RBC (Red Blood Cell Coun* 08/16/2017 4.65*  . Hemoglobin 08/16/2017 14.0*  . Hematocrit 08/16/2017 41.7   . MCV (Mean Corpuscular Vo* 08/16/2017 89.7   . MCH (Mean Corpuscular He* 08/16/2017 30.1   . MCHC (Mean Corpuscular H* 08/16/2017 33.6   . Platelet Count 08/16/2017 166   . RDW-CV (Red Cell Distrib* 08/16/2017 12.5   . MPV (Mean Platelet Volum* 08/16/2017 10.8   . Neutrophils 08/16/2017 4.36   . Lymphocytes 08/16/2017 3.26   . Monocytes 08/16/2017 0.78   . Eosinophils 08/16/2017 0.57*  . Basophils 08/16/2017 0.01   . Neutrophil % 08/16/2017 48.5   . Lymphocyte % 08/16/2017 36.3   . Monocyte % 08/16/2017  8.7   .  Eosinophil % 08/16/2017 6.3*  . Basophil% 08/16/2017 0.1   . Immature Granulocyte % 08/16/2017 0.1   . Immature Granulocyte Cou* 08/16/2017 0.01   . Glucose 08/16/2017 142*  . Sodium 08/16/2017 139   . Potassium 08/16/2017 3.9   . Chloride 08/16/2017 106   . Carbon Dioxide (CO2) 08/16/2017 27.0   . Urea Nitrogen (BUN) 08/16/2017 18   . Creatinine 08/16/2017 0.8   . Glomerular Filtration Ra* 08/16/2017 126   . Calcium 08/16/2017 9.0   . AST  08/16/2017 36   . ALT  08/16/2017 53   . Alk Phos (alkaline Phosp* 08/16/2017 86   . Albumin 08/16/2017 3.8   . Bilirubin, Total 08/16/2017 0.5   . Protein, Total 08/16/2017 7.1   . A/G Ratio 08/16/2017 1.2   . Amylase 08/16/2017 60   Office Visit on 08/01/2017  Component Date Value  . POC Glucose 08/01/2017 215*  . POC Hemoglobin A1C (Glyc* 08/01/2017 10.8*  . Average Blood Glucose 08/01/2017 276   Initial consult on 07/11/2017  Component Date Value  . H pylori Breath Test - L* 07/11/2017 Negative   . Endomysial Antibody IgA * 07/11/2017 Negative   . t-Transglutaminase (tTG)* 07/11/2017 <2   . Immunoglobulin A, Qn, Se* 07/11/2017 215   . C Reactive Protein - Lab* 07/11/2017 4.6   . Sedimentation Rate-Autom* 07/11/2017 26*    Abdominal ultrasound images were reviewed. No gallstones, no gallbladder wall thickening, no pericholecystic fluid.   ASSESSMENT: Joseph [name] is a 47 y.o. male presenting for consultation for gallbladder dyskinesia. Patient with symptoms that can be explained by the gallbladder low ejection fraction of 12%, such as the epigastric pain that radiates to the right upper quadrant associated with food intake. Patient was oriented about the gallbladder function and the diagnosis of gallbladder dyskinesia. Also oriented about the recommendations of gallbladder removal which in his case are not as clear as with patient with gallstones.  Patient was oriented that some patient with low ejection fraction on HIDA scan, does  not improve after cholecystectomy and that is very important to understand when considering surgery. He still want to proceed with surgery since this is causing pain almost every day after eating. Patient also with a very complicated cardiac history that was cleared by Cardiologist and Pulmonology. Cardiology agree to hold Plavix before surgery.   PLAN: 1.Cardiology clearance with specifications of Plavix management - Done 2.Pulmonologist clearance - Done 3.Avoid fatty or spicy food 4. Laparoscopic vs open cholecystectomy.   Patient verbalized understanding, all questions were answered, and were agreeable with the plan outlined above.   Herbert Pun, MD  Electronically signed by Herbert Pun, MD

## 2017-10-24 ENCOUNTER — Ambulatory Visit: Payer: Managed Care, Other (non HMO) | Admitting: Urology

## 2017-10-24 ENCOUNTER — Encounter: Payer: Self-pay | Admitting: Urology

## 2017-10-24 VITALS — BP 133/81 | HR 80 | Ht 73.0 in | Wt 219.0 lb

## 2017-10-24 DIAGNOSIS — R972 Elevated prostate specific antigen [PSA]: Secondary | ICD-10-CM | POA: Diagnosis not present

## 2017-10-24 NOTE — Progress Notes (Signed)
10/24/2017 9:56 AM   Joseph [name] October 13, 1970 [id number]  Referring provider: Center, [facility] [place], [place] [address]  Chief Complaint  Patient presents with  . Elevated PSA  . Erectile Dysfunction    HPI: 47 year old male presents for follow-up of an elevated PSA.  He had a PSA drawn in August 2018 which was elevated at 6.1.  He saw Dr. Tresa Moore October 2018 and a 20-month follow-up PSA with free to total was recommended.  His DRE was benign.  He also has erectile dysfunction and is on intracavernosal injections.   PMH: Past Medical History:  Diagnosis Date  . Diabetes mellitus without complication ([place])   . Hyperlipidemia   . Hypertension   . Ischemic cardiomyopathy   . Myocardial infarction [facility]) 2011   anterior MI at [place] s/p BMS to ostial/proximal LAD  . Systolic heart failure [facility])     Surgical History: Past Surgical History:  Procedure Laterality Date  . CARDIAC DEFIBRILLATOR PLACEMENT    . CARDIAC SURGERY    . LEFT HEART CATH AND CORONARY ANGIOGRAPHY N/A 11/23/2016   Procedure: Left Heart Cath and Coronary Angiography;  Surgeon: Nelva Bush, MD;  Location: [facility];  Service: Cardiovascular;  Laterality: N/A;    Home Medications:  Allergies as of 10/24/2017      Reactions   Vancomycin Shortness Of Breath   Lisinopril Rash      Medication List        Accurate as of 10/24/17  9:56 AM. Always use your most recent med list.          aspirin EC 81 MG tablet Take 81 mg by mouth every morning.   atorvastatin 40 MG tablet Commonly known as:  LIPITOR Take 40 mg by mouth at bedtime.   carvedilol 12.5 MG tablet Commonly known as:  COREG Take 12.5 mg by mouth 2 (two) times daily.   clopidogrel 75 MG tablet Commonly known as:  PLAVIX Take 1 tablet (75 mg total) by mouth daily.   digoxin 0.125 MG tablet Commonly known as:  LANOXIN Take 125 mcg by mouth daily.   fluticasone 50 MCG/ACT nasal spray Commonly  known as:  FLONASE Place 2 sprays into both nostrils daily as needed for allergies.   furosemide 20 MG tablet Commonly known as:  LASIX Take 1 tablet (20 mg total) by mouth 2 (two) times daily.   HUMALOG KWIKPEN 100 UNIT/ML KiwkPen Generic drug:  insulin lispro Inject 18-22 Units into the skin [name] admin instructions. Inject 18 units subcutaneous in the morning, inject 20 units subcutaneous at lunchtime, and inject 22 units subcutaneous at night. Plus sliding scale as directed.   LANTUS SOLOSTAR 100 UNIT/ML Solostar Pen Generic drug:  Insulin Glargine Inject 16-30 Units into the skin [name] admin instructions. Inject 30 units SQ in the morning and inject 16 units SQ in the evening   loratadine 10 MG tablet Commonly known as:  CLARITIN Take 1 tablet (10 mg total) by mouth daily.   losartan 100 MG tablet Commonly known as:  COZAAR Take 100 mg by mouth at bedtime.   pantoprazole 40 MG tablet Commonly known as:  PROTONIX Take 1 tablet (40 mg total) by mouth daily.   potassium chloride 10 MEQ CR capsule Commonly known as:  MICRO-K Take 10 mEq by mouth daily.   sodium chloride 0.65 % Soln nasal spray Commonly known as:  OCEAN Place 1 spray into both nostrils as needed for congestion.   spironolactone  25 MG tablet Commonly known as:  ALDACTONE Take 12.5 mg by mouth daily.   Vitamin D3 2000 units capsule Take 2,000 Units by mouth daily.       Allergies:  Allergies  Allergen Reactions  . Vancomycin Shortness Of Breath  . Lisinopril Rash    Family History: Family History  Problem Relation Age of Onset  . Hypertension Mother   . Heart attack Paternal Grandmother   . Bladder Cancer Neg Hx   . Kidney cancer Neg Hx   . Prostate cancer Neg Hx     Social History:  reports that  has never smoked. he has never used smokeless tobacco. He reports that he does not drink alcohol or use drugs.  ROS: UROLOGY Frequent Urination?: No Hard to postpone urination?: No Burning/pain  with urination?: No Get up at night to urinate?: No Leakage of urine?: No Urine stream starts and stops?: No Trouble starting stream?: No Do you have to strain to urinate?: No Blood in urine?: No Urinary tract infection?: No Sexually transmitted disease?: No Injury to kidneys or bladder?: No Painful intercourse?: No Weak stream?: No Erection problems?: Yes Penile pain?: No  Gastrointestinal Nausea?: No Vomiting?: No Indigestion/heartburn?: Yes Diarrhea?: No Constipation?: No  Constitutional Fever: No Night sweats?: No Weight loss?: No Fatigue?: No  Skin Skin rash/lesions?: No Itching?: No  Eyes Blurred vision?: No Double vision?: No  Ears/Nose/Throat Sore throat?: No Sinus problems?: Yes  Hematologic/Lymphatic Swollen glands?: No Easy bruising?: No  Cardiovascular Leg swelling?: No Chest pain?: No  Respiratory Cough?: Yes Shortness of breath?: Yes  Endocrine Excessive thirst?: No  Musculoskeletal Back pain?: No Joint pain?: No  Neurological Headaches?: No Dizziness?: No  Psychologic Depression?: No Anxiety?: No  Physical Exam: BP 133/81   Pulse 80   Ht 6\' 1"  (1.854 m)   Wt 219 lb (99.3 kg)   BMI 28.89 kg/m   Constitutional:  Alert and oriented, No acute distress. HEENT: [place] AT, moist mucus membranes.  Trachea midline, no masses. Cardiovascular: No clubbing, cyanosis, or edema. Respiratory: Normal respiratory effort, no increased work of breathing. GI: Abdomen is soft, nontender, nondistended, no abdominal masses GU: No CVA tenderness.  Skin: No rashes, bruises or suspicious lesions. Lymph: No cervical or inguinal adenopathy. Neurologic: Grossly intact, no focal deficits, moving all 4 extremities. Psychiatric: Normal mood and affect.  Laboratory Data: Lab Results  Component Value Date   WBC 8.2 02/24/2017   HGB 13.8 02/24/2017   HCT 40.3 02/24/2017   MCV 87.8 02/24/2017   PLT 131 (L) 02/24/2017    Lab Results  Component  Value Date   CREATININE 0.69 02/24/2017     Lab Results  Component Value Date   HGBA1C 11.2 (H) 11/24/2016     Assessment & Plan:    1. Elevated PSA We discussed potential causes of an elevated PSA including BPH, inflammation and prostate cancer.  His PSA will be repeated today.  If it remains elevated I recommended scheduling a transrectal ultrasound biopsy of the prostate.  Potential risks were discussed including bleeding and infection/sepsis.  We discussed alternatives of prostate MRI and surveillance.  - PSA, total and free    Abbie Sons, MD  [facility] [address], [place], [place] [address] [phone]

## 2017-10-25 LAB — PSA, TOTAL AND FREE
PSA, Free Pct: 12.4 %
PSA, Free: 0.82 ng/mL
Prostate Specific Ag, Serum: 6.6 ng/mL — ABNORMAL HIGH (ref 0.0–4.0)

## 2017-10-28 ENCOUNTER — Telehealth: Payer: Self-pay

## 2017-10-28 ENCOUNTER — Other Ambulatory Visit: Payer: Self-pay

## 2017-10-28 ENCOUNTER — Other Ambulatory Visit: Payer: 59

## 2017-10-28 ENCOUNTER — Encounter
Admission: RE | Admit: 2017-10-28 | Discharge: 2017-10-28 | Disposition: A | Discharge status: Home / Self Care | Payer: Managed Care, Other (non HMO) | Source: Ambulatory Visit | Attending: General Surgery | Admitting: General Surgery

## 2017-10-28 DIAGNOSIS — Z95810 Presence of automatic (implantable) cardiac defibrillator: Secondary | ICD-10-CM

## 2017-10-28 DIAGNOSIS — R9431 Abnormal electrocardiogram [ECG] [EKG]: Secondary | ICD-10-CM | POA: Diagnosis not present

## 2017-10-28 DIAGNOSIS — Z01810 Encounter for preprocedural cardiovascular examination: Secondary | ICD-10-CM | POA: Insufficient documentation

## 2017-10-28 HISTORY — DX: Gastro-esophageal reflux disease without esophagitis: K21.9

## 2017-10-28 HISTORY — DX: Unspecified convulsions: R56.9

## 2017-10-28 HISTORY — DX: Atherosclerotic heart disease of native coronary artery without angina pectoris: I25.10

## 2017-10-28 HISTORY — DX: Presence of automatic (implantable) cardiac defibrillator: Z95.810

## 2017-10-28 HISTORY — DX: Obstructive sleep apnea (adult) (pediatric): G47.33

## 2017-10-28 NOTE — Patient Instructions (Signed)
Your procedure is scheduled on: Monday, November 07, 2017 Report to Same Day Surgery on the 2nd floor in the [facility]. To find out your arrival time, please call [phone] between 1PM - 3PM on: Friday, November 04, 2017  REMEMBER: Instructions that are not followed completely may result in serious medical risk, up to and including death; or upon the discretion of your surgeon and anesthesiologist your surgery may need to be rescheduled.  Do not eat food after midnight the night before your procedure.  No gum chewing or hard candies.  You may however, drink water up to 2 hours before you are scheduled to arrive at the hospital for your procedure.  Do not drink water within 2 hours of the start of your surgery.  No Alcohol for 24 hours before or after surgery.  No Smoking including e-cigarettes for 24 hours prior to surgery. No chewable tobacco products for at least 6 hours prior to surgery. No nicotine patches on the day of surgery.  Notify your doctor if there is any change in your medical condition (cold, fever, infection).  Do not wear jewelry, make-up, hairpins, clips or nail polish.  Do not wear lotions, powders, or perfumes. You may wear deodorant.  Do not shave 48 hours prior to surgery. Men may shave face and neck.  Contacts and dentures may not be worn into surgery.  Do not bring valuables to the hospital. [facility] is not responsible for any belongings or valuables.  TAKE THESE MEDICATIONS THE MORNING OF SURGERY WITH A SIP OF WATER:  1.  Carvedilol 2.  Pantoprazole 3.  Digoxin   Use CHG Soap as directed on instruction sheet.  Bring your C-PAP to the hospital with you in case you may have to spend the night.  Take 1/2 of usual insulin dose the night before surgery and none on the morning of surgery. Take 8 units Lantus the night before surgery and NONE on the morning of surgery.  Follow recommendations from Cardiologist or PCP regarding stopping Aspirin.  On  January 21 - Stop Anti-inflammatories such as Advil, Aleve, Ibuprofen, Motrin, Naproxen, Naprosyn, Goodie powder, or aspirin products. (May take Tylenol or Acetaminophen if needed.)  On January 21 - Stop ANY OVER THE COUNTER supplements until after surgery. (May continue Vitamin D.)  If you are being admitted to the hospital overnight, leave your suitcase in the car. After surgery it may be brought to your room.  If you are being discharged the day of surgery, you will not be allowed to drive home. You will need someone to drive you home and stay with you that night.   If you are taking public transportation, you will need to have a responsible adult to with you.  Please call the number above if you have any questions about these instructions.

## 2017-10-28 NOTE — Telephone Encounter (Signed)
-----   Message from Abbie Sons, MD sent at 10/26/2017  4:47 PM EST ----- Repeat PSA remains elevated at 6.6.  As we discussed earlier this week I would recommend a prostate biopsy for a persistently elevated PSA.  Will need to see if he can stop his aspirin and Plavix prior to biopsy.

## 2017-10-28 NOTE — Pre-Procedure Instructions (Signed)
EKG performed today and brought to the attention of Dr. Randa Lynn (anesthesia); ok to proceed to upcoming surgery. Cardiology and Pulmonary clearances on the chart. Today's EKG result faxed to Dr. Saralyn Pilar office along with defibrillator form to be filled out and faxed back. A note was faxed to Dr. Saralyn Pilar, along with a phone call informing of the patient stating that he does not take Plavix. According to last cardiology note, the patient is on Plavix but the patient denies having Plavix in his "drug regime". While the patient was in preadmission testing visit, he was instructed to call Dr. Saralyn Pilar office to let them know about not taking plavix and whether he is to be taking it.

## 2017-10-28 NOTE — Telephone Encounter (Signed)
LMOM

## 2017-11-01 NOTE — Telephone Encounter (Signed)
Pt is having gallbladder surgery next Monday 1/28.  Pt wants to know if he would be able to do biopsy the first week in February w/[facility].  Please let pt know.

## 2017-11-02 NOTE — Telephone Encounter (Signed)
LMOM

## 2017-11-02 NOTE — Telephone Encounter (Signed)
As long as it was okay with the surgeon who is performing his gallbladder surgery.

## 2017-11-04 NOTE — Telephone Encounter (Signed)
LMOM- will send a letter.  

## 2017-11-06 MED ORDER — CEFAZOLIN SODIUM-DEXTROSE 2-4 GM/100ML-% IV SOLN
2.0000 g | INTRAVENOUS | Status: AC
  Administered 2017-11-07: 2 g via INTRAVENOUS

## 2017-11-07 ENCOUNTER — Ambulatory Visit: Payer: Managed Care, Other (non HMO) | Admitting: Anesthesiology

## 2017-11-07 ENCOUNTER — Encounter
Admission: AD | Disposition: A | Discharge status: Home / Self Care | Payer: Self-pay | Source: Ambulatory Visit | Attending: General Surgery

## 2017-11-07 ENCOUNTER — Observation Stay
Admission: AD | Admit: 2017-11-07 | Discharge: 2017-11-08 | Disposition: A | Discharge status: Home / Self Care | Payer: Managed Care, Other (non HMO) | Source: Ambulatory Visit | Attending: General Surgery | Admitting: General Surgery

## 2017-11-07 ENCOUNTER — Other Ambulatory Visit: Payer: Self-pay

## 2017-11-07 ENCOUNTER — Encounter: Payer: Self-pay | Admitting: Anesthesiology

## 2017-11-07 DIAGNOSIS — K828 Other specified diseases of gallbladder: Secondary | ICD-10-CM | POA: Diagnosis present

## 2017-11-07 DIAGNOSIS — E139 Other specified diabetes mellitus without complications: Secondary | ICD-10-CM | POA: Insufficient documentation

## 2017-11-07 DIAGNOSIS — I252 Old myocardial infarction: Secondary | ICD-10-CM | POA: Diagnosis not present

## 2017-11-07 DIAGNOSIS — I2510 Atherosclerotic heart disease of native coronary artery without angina pectoris: Secondary | ICD-10-CM | POA: Insufficient documentation

## 2017-11-07 DIAGNOSIS — K219 Gastro-esophageal reflux disease without esophagitis: Secondary | ICD-10-CM | POA: Insufficient documentation

## 2017-11-07 DIAGNOSIS — Z955 Presence of coronary angioplasty implant and graft: Secondary | ICD-10-CM | POA: Insufficient documentation

## 2017-11-07 DIAGNOSIS — Z7982 Long term (current) use of aspirin: Secondary | ICD-10-CM | POA: Diagnosis not present

## 2017-11-07 DIAGNOSIS — Z95810 Presence of automatic (implantable) cardiac defibrillator: Secondary | ICD-10-CM | POA: Diagnosis not present

## 2017-11-07 DIAGNOSIS — I255 Ischemic cardiomyopathy: Secondary | ICD-10-CM | POA: Insufficient documentation

## 2017-11-07 DIAGNOSIS — K811 Chronic cholecystitis: Secondary | ICD-10-CM | POA: Diagnosis not present

## 2017-11-07 DIAGNOSIS — E559 Vitamin D deficiency, unspecified: Secondary | ICD-10-CM | POA: Diagnosis not present

## 2017-11-07 DIAGNOSIS — Z794 Long term (current) use of insulin: Secondary | ICD-10-CM | POA: Insufficient documentation

## 2017-11-07 DIAGNOSIS — Z7951 Long term (current) use of inhaled steroids: Secondary | ICD-10-CM | POA: Insufficient documentation

## 2017-11-07 DIAGNOSIS — G4733 Obstructive sleep apnea (adult) (pediatric): Secondary | ICD-10-CM | POA: Diagnosis not present

## 2017-11-07 DIAGNOSIS — Z7902 Long term (current) use of antithrombotics/antiplatelets: Secondary | ICD-10-CM | POA: Insufficient documentation

## 2017-11-07 DIAGNOSIS — I110 Hypertensive heart disease with heart failure: Secondary | ICD-10-CM | POA: Insufficient documentation

## 2017-11-07 DIAGNOSIS — Z79899 Other long term (current) drug therapy: Secondary | ICD-10-CM | POA: Insufficient documentation

## 2017-11-07 DIAGNOSIS — E785 Hyperlipidemia, unspecified: Secondary | ICD-10-CM | POA: Insufficient documentation

## 2017-11-07 DIAGNOSIS — I509 Heart failure, unspecified: Secondary | ICD-10-CM | POA: Diagnosis not present

## 2017-11-07 HISTORY — PX: CHOLECYSTECTOMY: SHX55

## 2017-11-07 LAB — BASIC METABOLIC PANEL
Anion gap: 6 (ref 5–15)
BUN: 13 mg/dL (ref 6–20)
CALCIUM: 8.3 mg/dL — AB (ref 8.9–10.3)
CO2: 28 mmol/L (ref 22–32)
Chloride: 103 mmol/L (ref 101–111)
Creatinine, Ser: 1 mg/dL (ref 0.61–1.24)
Glucose, Bld: 237 mg/dL — ABNORMAL HIGH (ref 65–99)
Potassium: 3.8 mmol/L (ref 3.5–5.1)
SODIUM: 137 mmol/L (ref 135–145)

## 2017-11-07 LAB — GLUCOSE, CAPILLARY
GLUCOSE-CAPILLARY: 291 mg/dL — AB (ref 65–99)
GLUCOSE-CAPILLARY: 259 mg/dL — AB (ref 65–99)
GLUCOSE-CAPILLARY: 276 mg/dL — AB (ref 65–99)
Glucose-Capillary: 292 mg/dL — ABNORMAL HIGH (ref 65–99)
Glucose-Capillary: 253 mg/dL — ABNORMAL HIGH (ref 65–99)

## 2017-11-07 SURGERY — LAPAROSCOPIC CHOLECYSTECTOMY
Anesthesia: General | Wound class: Clean Contaminated

## 2017-11-07 MED ORDER — PROPOFOL 10 MG/ML IV BOLUS
INTRAVENOUS | Status: DC | PRN
  Administered 2017-11-07: 120 mg via INTRAVENOUS

## 2017-11-07 MED ORDER — POTASSIUM CHLORIDE ER 10 MEQ PO TBCR
10.0000 meq | EXTENDED_RELEASE_TABLET | Freq: Every day | ORAL | Status: DC
  Administered 2017-11-07 – 2017-11-08 (×2): 10 meq via ORAL
  Filled 2017-11-07 (×4): qty 1

## 2017-11-07 MED ORDER — LOSARTAN POTASSIUM 50 MG PO TABS
100.0000 mg | ORAL_TABLET | Freq: Every day | ORAL | Status: DC
  Administered 2017-11-07: 100 mg via ORAL
  Filled 2017-11-07: qty 2

## 2017-11-07 MED ORDER — INSULIN ASPART 100 UNIT/ML ~~LOC~~ SOLN
SUBCUTANEOUS | Status: AC
  Administered 2017-11-07: 5 [IU] via SUBCUTANEOUS
  Filled 2017-11-07: qty 1

## 2017-11-07 MED ORDER — INSULIN ASPART 100 UNIT/ML ~~LOC~~ SOLN
5.0000 [IU] | Freq: Once | SUBCUTANEOUS | Status: AC
  Administered 2017-11-07: 5 [IU] via SUBCUTANEOUS

## 2017-11-07 MED ORDER — MIDAZOLAM HCL 2 MG/2ML IJ SOLN
INTRAMUSCULAR | Status: DC | PRN
  Administered 2017-11-07: 2 mg via INTRAVENOUS

## 2017-11-07 MED ORDER — ATORVASTATIN CALCIUM 20 MG PO TABS
80.0000 mg | ORAL_TABLET | Freq: Every day | ORAL | Status: DC
  Administered 2017-11-07: 80 mg via ORAL
  Filled 2017-11-07: qty 4

## 2017-11-07 MED ORDER — INSULIN ASPART 100 UNIT/ML ~~LOC~~ SOLN
22.0000 [IU] | Freq: Every day | SUBCUTANEOUS | Status: DC
  Administered 2017-11-07: 22 [IU] via SUBCUTANEOUS
  Filled 2017-11-07: qty 1

## 2017-11-07 MED ORDER — ONDANSETRON 4 MG PO TBDP: 4.0000 mg | ORAL_TABLET | Freq: Four times a day (QID) | ORAL | Status: DC | PRN

## 2017-11-07 MED ORDER — MIDAZOLAM HCL 2 MG/2ML IJ SOLN
INTRAMUSCULAR | Status: AC
  Filled 2017-11-07: qty 2

## 2017-11-07 MED ORDER — DIGOXIN 125 MCG PO TABS
125.0000 ug | ORAL_TABLET | Freq: Every day | ORAL | Status: DC
  Administered 2017-11-08: 125 ug via ORAL
  Filled 2017-11-07: qty 1

## 2017-11-07 MED ORDER — LACTATED RINGERS IV SOLN
INTRAVENOUS | Status: DC | PRN
  Administered 2017-11-07: 08:00:00 via INTRAVENOUS

## 2017-11-07 MED ORDER — OXYCODONE HCL 5 MG PO TABS: 5.0000 mg | ORAL_TABLET | Freq: Once | ORAL | Status: DC | PRN

## 2017-11-07 MED ORDER — INSULIN GLARGINE 100 UNIT/ML ~~LOC~~ SOLN
15.0000 [IU] | Freq: Two times a day (BID) | SUBCUTANEOUS | Status: DC
  Administered 2017-11-07 – 2017-11-08 (×2): 15 [IU] via SUBCUTANEOUS
  Filled 2017-11-07 (×4): qty 0.15

## 2017-11-07 MED ORDER — OXYCODONE HCL 5 MG/5ML PO SOLN: 5.0000 mg | Freq: Once | ORAL | Status: DC | PRN

## 2017-11-07 MED ORDER — MORPHINE SULFATE (PF) 4 MG/ML IV SOLN
4.0000 mg | INTRAVENOUS | Status: DC | PRN
  Administered 2017-11-08: 4 mg via INTRAVENOUS
  Filled 2017-11-07: qty 1

## 2017-11-07 MED ORDER — FUROSEMIDE 20 MG PO TABS
20.0000 mg | ORAL_TABLET | Freq: Two times a day (BID) | ORAL | Status: DC
  Administered 2017-11-07 – 2017-11-08 (×2): 20 mg via ORAL
  Filled 2017-11-07 (×2): qty 1

## 2017-11-07 MED ORDER — INSULIN GLARGINE 100 UNIT/ML SOLOSTAR PEN: 16.0000 [IU] | PEN_INJECTOR | SUBCUTANEOUS | Status: DC

## 2017-11-07 MED ORDER — ONDANSETRON HCL 4 MG/2ML IJ SOLN: 4.0000 mg | Freq: Four times a day (QID) | INTRAMUSCULAR | Status: DC | PRN

## 2017-11-07 MED ORDER — INSULIN ASPART 100 UNIT/ML ~~LOC~~ SOLN
18.0000 [IU] | Freq: Every day | SUBCUTANEOUS | Status: DC
  Administered 2017-11-08: 18 [IU] via SUBCUTANEOUS
  Filled 2017-11-07: qty 1

## 2017-11-07 MED ORDER — PANTOPRAZOLE SODIUM 40 MG PO TBEC
40.0000 mg | DELAYED_RELEASE_TABLET | Freq: Every day | ORAL | Status: DC
  Administered 2017-11-07 – 2017-11-08 (×2): 40 mg via ORAL
  Filled 2017-11-07 (×2): qty 1

## 2017-11-07 MED ORDER — ENOXAPARIN SODIUM 40 MG/0.4ML ~~LOC~~ SOLN
40.0000 mg | SUBCUTANEOUS | Status: DC
  Administered 2017-11-08: 40 mg via SUBCUTANEOUS
  Filled 2017-11-07: qty 0.4

## 2017-11-07 MED ORDER — VITAMIN D 1000 UNITS PO TABS
1000.0000 [IU] | ORAL_TABLET | Freq: Every day | ORAL | Status: DC
  Administered 2017-11-07 – 2017-11-08 (×2): 1000 [IU] via ORAL
  Filled 2017-11-07 (×4): qty 1

## 2017-11-07 MED ORDER — BUPIVACAINE-EPINEPHRINE (PF) 0.5% -1:200000 IJ SOLN
INTRAMUSCULAR | Status: AC
  Filled 2017-11-07: qty 30

## 2017-11-07 MED ORDER — TRAMADOL HCL 50 MG PO TABS
50.0000 mg | ORAL_TABLET | Freq: Four times a day (QID) | ORAL | Status: DC | PRN
  Administered 2017-11-07: 50 mg via ORAL
  Filled 2017-11-07 (×3): qty 1

## 2017-11-07 MED ORDER — FLUTICASONE PROPIONATE 50 MCG/ACT NA SUSP: 2.0000 | Freq: Every day | NASAL | Status: DC | PRN

## 2017-11-07 MED ORDER — SUGAMMADEX SODIUM 500 MG/5ML IV SOLN
INTRAVENOUS | Status: DC | PRN
  Administered 2017-11-07: 397.2 mg via INTRAVENOUS

## 2017-11-07 MED ORDER — INSULIN ASPART 100 UNIT/ML ~~LOC~~ SOLN: 20.0000 [IU] | Freq: Every day | SUBCUTANEOUS | Status: DC

## 2017-11-07 MED ORDER — LIDOCAINE HCL (CARDIAC) 20 MG/ML IV SOLN
INTRAVENOUS | Status: DC | PRN
  Administered 2017-11-07: 80 mg via INTRAVENOUS

## 2017-11-07 MED ORDER — SODIUM CHLORIDE 0.9 % IV SOLN
INTRAVENOUS | Status: DC
  Administered 2017-11-07: 08:00:00 via INTRAVENOUS

## 2017-11-07 MED ORDER — PHENYLEPHRINE HCL 10 MG/ML IJ SOLN
INTRAMUSCULAR | Status: DC | PRN
  Administered 2017-11-07 (×4): 100 ug via INTRAVENOUS
  Administered 2017-11-07: 200 ug via INTRAVENOUS

## 2017-11-07 MED ORDER — INSULIN ASPART 100 UNIT/ML ~~LOC~~ SOLN
0.0000 [IU] | SUBCUTANEOUS | Status: DC
  Administered 2017-11-07 (×2): 8 [IU] via SUBCUTANEOUS
  Administered 2017-11-08: 3 [IU] via SUBCUTANEOUS
  Administered 2017-11-08: 5 [IU] via SUBCUTANEOUS
  Administered 2017-11-08: 3 [IU] via SUBCUTANEOUS
  Filled 2017-11-07 (×5): qty 1

## 2017-11-07 MED ORDER — INSULIN REGULAR HUMAN 100 UNIT/ML IJ SOLN: 5.0000 [IU] | Freq: Once | INTRAMUSCULAR | Status: DC

## 2017-11-07 MED ORDER — CARVEDILOL 25 MG PO TABS
25.0000 mg | ORAL_TABLET | Freq: Two times a day (BID) | ORAL | Status: DC
  Administered 2017-11-07 – 2017-11-08 (×3): 25 mg via ORAL
  Filled 2017-11-07 (×3): qty 1

## 2017-11-07 MED ORDER — ONDANSETRON HCL 4 MG/2ML IJ SOLN
INTRAMUSCULAR | Status: DC | PRN
  Administered 2017-11-07: 4 mg via INTRAVENOUS

## 2017-11-07 MED ORDER — FENTANYL CITRATE (PF) 100 MCG/2ML IJ SOLN
INTRAMUSCULAR | Status: DC | PRN
  Administered 2017-11-07: 100 ug via INTRAVENOUS

## 2017-11-07 MED ORDER — BUPIVACAINE-EPINEPHRINE 0.5% -1:200000 IJ SOLN
INTRAMUSCULAR | Status: DC | PRN
  Administered 2017-11-07: 9 mL

## 2017-11-07 MED ORDER — ROCURONIUM BROMIDE 100 MG/10ML IV SOLN
INTRAVENOUS | Status: DC | PRN
  Administered 2017-11-07: 50 mg via INTRAVENOUS
  Administered 2017-11-07: 10 mg via INTRAVENOUS

## 2017-11-07 MED ORDER — FENTANYL CITRATE (PF) 100 MCG/2ML IJ SOLN
INTRAMUSCULAR | Status: AC
  Filled 2017-11-07: qty 2

## 2017-11-07 MED ORDER — SPIRONOLACTONE 12.5 MG HALF TABLET
12.5000 mg | ORAL_TABLET | Freq: Every day | ORAL | Status: DC
  Administered 2017-11-07 – 2017-11-08 (×2): 12.5 mg via ORAL
  Filled 2017-11-07 (×2): qty 1

## 2017-11-07 MED ORDER — CEFAZOLIN SODIUM-DEXTROSE 2-4 GM/100ML-% IV SOLN
INTRAVENOUS | Status: AC
  Filled 2017-11-07: qty 100

## 2017-11-07 MED ORDER — FENTANYL CITRATE (PF) 100 MCG/2ML IJ SOLN
INTRAMUSCULAR | Status: AC
  Administered 2017-11-07: 25 ug via INTRAVENOUS
  Filled 2017-11-07: qty 2

## 2017-11-07 MED ORDER — FENTANYL CITRATE (PF) 100 MCG/2ML IJ SOLN
25.0000 ug | INTRAMUSCULAR | Status: DC | PRN
  Administered 2017-11-07 (×4): 25 ug via INTRAVENOUS

## 2017-11-07 MED ORDER — PROPOFOL 10 MG/ML IV BOLUS
INTRAVENOUS | Status: AC
  Filled 2017-11-07: qty 20

## 2017-11-07 SURGICAL SUPPLY — 38 items
APPLIER CLIP LOGIC TI 5 (MISCELLANEOUS) ×2 IMPLANT
BLADE SURG SZ11 CARB STEEL (BLADE) ×2 IMPLANT
CANISTER SUCT 1200ML W/VALVE (MISCELLANEOUS) ×2 IMPLANT
CHLORAPREP W/TINT 26ML (MISCELLANEOUS) ×2 IMPLANT
DERMABOND ADVANCED (GAUZE/BANDAGES/DRESSINGS) ×1
DERMABOND ADVANCED .7 DNX12 (GAUZE/BANDAGES/DRESSINGS) ×1 IMPLANT
DRAPE SHEET LG 3/4 BI-LAMINATE (DRAPES) ×2 IMPLANT
ELECT E-Z MONOPOLAR 33 (MISCELLANEOUS) ×2
ELECT REM PT RETURN 9FT ADLT (ELECTROSURGICAL) ×2
ELECTRODE E-Z MONOPOLAR 33 (MISCELLANEOUS) ×1 IMPLANT
ELECTRODE REM PT RTRN 9FT ADLT (ELECTROSURGICAL) ×1 IMPLANT
GAUZE SPONGE 4X4 12PLY STRL (GAUZE/BANDAGES/DRESSINGS) ×2 IMPLANT
GLOVE BIO SURGEON STRL SZ 6.5 (GLOVE) ×6 IMPLANT
GOWN STRL REUS W/ TWL LRG LVL3 (GOWN DISPOSABLE) ×3 IMPLANT
GOWN STRL REUS W/TWL LRG LVL3 (GOWN DISPOSABLE) ×3
GRASPER SUT TROCAR 14GX15 (MISCELLANEOUS) ×2 IMPLANT
IRRIGATION STRYKERFLOW (MISCELLANEOUS) ×1 IMPLANT
IRRIGATOR STRYKERFLOW (MISCELLANEOUS) ×2
IV NS 1000ML (IV SOLUTION) ×1
IV NS 1000ML BAXH (IV SOLUTION) ×1 IMPLANT
KIT RM TURNOVER STRD PROC AR (KITS) ×2 IMPLANT
L-HOOK LAP DISP 36CM (ELECTROSURGICAL) ×2
LABEL OR SOLS (LABEL) ×2 IMPLANT
LHOOK LAP DISP 36CM (ELECTROSURGICAL) ×1 IMPLANT
NDL SAFETY 25GX1.5 (NEEDLE) ×2 IMPLANT
NEEDLE INSUFFLATION 14GA 120MM (NEEDLE) ×2 IMPLANT
NS IRRIG 500ML POUR BTL (IV SOLUTION) ×2 IMPLANT
PACK LAP CHOLECYSTECTOMY (MISCELLANEOUS) ×2 IMPLANT
PENCIL ELECTRO HAND CTR (MISCELLANEOUS) ×2 IMPLANT
POUCH SPECIMEN RETRIEVAL 10MM (ENDOMECHANICALS) IMPLANT
SCISSORS METZENBAUM CVD 33 (INSTRUMENTS) ×2 IMPLANT
SLEEVE ENDOPATH XCEL 5M (ENDOMECHANICALS) ×4 IMPLANT
SUT MNCRL AB 4-0 PS2 18 (SUTURE) ×2 IMPLANT
SUT VIC AB 0 CT1 36 (SUTURE) IMPLANT
SUT VICRYL 0 AB UR-6 (SUTURE) ×2 IMPLANT
TROCAR XCEL NON-BLD 11X100MML (ENDOMECHANICALS) ×2 IMPLANT
TROCAR XCEL NON-BLD 5MMX100MML (ENDOMECHANICALS) ×2 IMPLANT
TUBING INSUFFLATION (TUBING) ×2 IMPLANT

## 2017-11-07 NOTE — Anesthesia Postprocedure Evaluation (Signed)
Anesthesia Post Note  Patient: Joseph [name]  Procedure(s) Performed: LAPAROSCOPIC CHOLECYSTECTOMY (N/A )  Patient location during evaluation: PACU Anesthesia Type: General Level of consciousness: awake and alert Pain management: pain level controlled Vital Signs Assessment: post-procedure vital signs reviewed and stable Respiratory status: spontaneous breathing, nonlabored ventilation, respiratory function stable and patient connected to nasal cannula oxygen Cardiovascular status: blood pressure returned to baseline and stable Postop Assessment: no apparent nausea or vomiting Anesthetic complications: no     Last Vitals:  Vitals:   11/07/17 1145 11/07/17 1208  BP: (!) 150/97 (!) 147/92  Pulse: 83 78  Resp: 14 10  Temp: 36.8 C 36.7 C  SpO2: 97% 97%    Last Pain:  Vitals:   11/07/17 1218  TempSrc:   PainSc: 5                  Precious Haws Neeya Prigmore

## 2017-11-07 NOTE — Anesthesia Post-op Follow-up Note (Deleted)
Anesthesia QCDR form completed.        

## 2017-11-07 NOTE — Interval H&P Note (Signed)
History and Physical Interval Note:  11/07/2017 7:51 AM  Joseph [name]  has presented today for surgery, with the diagnosis of DYSKINESIA OF GALLBLADDER  The various methods of treatment have been discussed with the patient and family. After consideration of risks, benefits and other options for treatment, the patient has consented to  Procedure(s): LAPAROSCOPIC CHOLECYSTECTOMY (N/A) as a surgical intervention .  The patient's history has been reviewed, patient examined, no change in status, stable for surgery.  I have reviewed the patient's chart and labs.  Questions were answered to the patient's satisfaction.     Herbert Pun

## 2017-11-07 NOTE — Progress Notes (Signed)
Inpatient Diabetes Program Recommendations  AACE/ADA: New Consensus Statement on Inpatient Glycemic Control (2015)  Target Ranges:  Prepandial:   less than 140 mg/dL      Peak postprandial:   less than 180 mg/dL (1-2 hours)      Critically ill patients:  140 - 180 mg/dL  Results for JIREH, ELMORE (MRN [id number]) as of 11/07/2017 12:41  Ref. Range 11/07/2017 07:27 11/07/2017 10:20 11/07/2017 11:04  Glucose-Capillary Latest Ref Range: 65 - 99 mg/dL 292 (H) 291 (H) 259 (H)    Review of Glycemic Control  Diabetes history: DM2 Outpatient Diabetes medications: Lantus 30 units QAM, Lantus 16 units QPM, Huamlog 18 units with breakfast, Humalog 20 units with lunch, Humalog 22 units with supper Current orders for Inpatient glycemic control: Lantus 16-30 units see admin instructions, Humalog 18-22 units see admin instructions  Inpatient Diabetes Program Recommendations: Insulin - Basal: Please consider discontinuing current Lantus range order. Please consider ordering Lantus 15 units BID. Correction (SSI): While inpatient, please consider using Glycemic Control order set and ordering CBGs with Novolog 0-15 units TID with meals and Novolog 0-5 units QHS for correction. Insulin - Meal Coverage: Please consider ordering Novolog 4 units TID with meals for meal coverage if patient eats at least 50% of meals. A1C: Please consider ordering an A1C to evaluate glycemic control over the past 2-3 months.  Thanks, Barnie Alderman, RN, MSN, CDE Diabetes Coordinator Inpatient Diabetes Program [phone] (Team Pager from 8am to 5pm)

## 2017-11-07 NOTE — Anesthesia Post-op Follow-up Note (Signed)
Anesthesia QCDR form completed.        

## 2017-11-07 NOTE — Op Note (Signed)
Preoperative diagnosis: Dyskinesia of the gallbladder  Postoperative diagnosis: Dyskinesia of the gallbladder.  Procedure: Laparoscopic Cholecystectomy.   Anesthesia: GETA   Surgeon: Dr. Windell Moment  Wound Classification: Clean Contaminated  Indications: Patient is a 47 y.o. male developed right upper quadrant pain and nausea and on workup was found to have no cholelithiasis with a normal common duct and a very low ejection fraction of the gallbladder. Patient's symptoms are characteristic of biliary colic. Laparoscopic cholecystectomy was elected.  Findings: Critical view of safety achieved Cystic duct and artery identified, ligated and divided Adequate hemostasis  Description of procedure: The patient was placed on the operating table in the supine position. General anesthesia was induced. Anesthesia place a magnet on the pacemaker. A time-out was completed verifying correct patient, procedure, site, positioning, and implant(s) and/or special equipment prior to beginning this procedure. An orogastric tube was placed. The abdomen was prepped and draped in the usual sterile fashion.  An incision was made in a natural skin line below the umbilicus.  The fascia was elevated and the Veress needle inserted. Proper position was confirmed by aspiration and saline meniscus test.  The abdomen was insufflated with carbon dioxide to a pressure of 15 mmHg. The patient tolerated insufflation well. A 11-mm trocar was then inserted.  The laparoscope was inserted and the abdomen inspected. No injuries from initial trocar placement were noted. Additional trocars were then inserted in the following locations: a 5-mm trocar in the right epigastrium and two 5-mm trocars along the right costal margin. The abdomen was inspected and no abnormalities were found. The table was placed in the reverse Trendelenburg position with the right side up.  Filmy adhesions between the gallbladder and omentum, duodenum and  transverse colon were lysed sharply. The dome of the gallbladder was grasped with an atraumatic grasper passed through the lateral port and retracted over the dome of the liver. The infundibulum was also grasped with an atraumatic grasper through the midclavicular port and retracted toward the right lower quadrant. This maneuver exposed Calot's triangle. The peritoneum overlying the gallbladder infundibulum was then incised and the cystic duct and cystic artery identified and circumferentially dissected. The cystic duct and cystic artery were then doubly clipped and divided close to the gallbladder.  The gallbladder was then dissected from its peritoneal attachments by electrocautery. Hemostasis was checked and the gallbladder and contained stones were removed using an endoscopic retrieval bag placed through the umbilical port. The gallbladder was passed off the table as a specimen. The gallbladder fossa was copiously irrigated with saline and hemostasis was obtained. There was no evidence of bleeding from the gallbladder fossa or cystic artery or leakage of the bile from the cystic duct stump. Secondary trocars were removed under direct vision. No bleeding was noted. The laparoscope was withdrawn and the umbilical trocar removed. The abdomen was allowed to collapse. The fascia of the 3mm trocar sites was closed with figure-of-eight 0 vicryl sutures. The skin was closed with subcuticular sutures of 4-0 monocryl and topical skin adhesive. The orogastric tube was removed.  The patient tolerated the procedure well and was taken to the postanesthesia care unit in stable condition where the pacemaker was going to be interrogated by anesthesia.   Specimen: Gallbladder  Complications: None  EBL: 50mL

## 2017-11-07 NOTE — Anesthesia Preprocedure Evaluation (Addendum)
Anesthesia Evaluation  Patient identified by MRN, date of birth, ID band Patient awake    Reviewed: Allergy & Precautions, H&P , NPO status , Patient's Chart, lab work & pertinent test results  History of Anesthesia Complications Negative for: history of anesthetic complications  Airway Mallampati: III  TM Distance: >3 FB Neck ROM: full    Dental  (+) Chipped, Poor Dentition   Pulmonary neg shortness of breath, sleep apnea , pneumonia,           Cardiovascular Exercise Tolerance: Good hypertension, + CAD and + Past MI  (-) DOE and (-) DVT + Cardiac Defibrillator      Neuro/Psych Seizures -, Well Controlled,  negative psych ROS   GI/Hepatic Neg liver ROS, GERD  Medicated and Controlled,  Endo/Other  diabetes, Type 2  Renal/GU      Musculoskeletal   Abdominal   Peds  Hematology negative hematology ROS (+)   Anesthesia Other Findings Past Medical History: No date: AICD (automatic cardioverter/defibrillator) present No date: Coronary artery disease No date: Diabetes mellitus without complication (HCC) No date: GERD (gastroesophageal reflux disease) No date: Hyperlipidemia No date: Hypertension No date: Ischemic cardiomyopathy 2011: Myocardial infarction [facility])     Comment:  anterior MI at [facility] s/p BMS to ostial/proximal LAD 2018: Obstructive sleep apnea No date: Seizures ([place])     Comment:  diabetes seizures (low blood sugar); last one 1245 No date: Systolic heart failure [facility])  Past Surgical History: 08/30/2010: CARDIAC DEFIBRILLATOR PLACEMENT No date: CORONARY ANGIOPLASTY 11/23/2016: LEFT HEART CATH AND CORONARY ANGIOGRAPHY; N/A     Comment:  Procedure: Left Heart Cath and Coronary Angiography;                Surgeon: Nelva Bush, MD;  Location: [place] [facility]              LAB;  Service: Cardiovascular;  Laterality: N/A;  BMI    Body Mass Index:  28.89 kg/m      Reproductive/Obstetrics negative  OB ROS                             Anesthesia Physical Anesthesia Plan  ASA: IV  Anesthesia Plan: General ETT   Post-op Pain Management:    Induction: Intravenous  PONV Risk Score and Plan: 3 and Ondansetron, Dexamethasone and Midazolam  Airway Management Planned: Oral ETT  Additional Equipment:   Intra-op Plan:   Post-operative Plan: Extubation in OR  Informed Consent: I have reviewed the patients History and Physical, chart, labs and discussed the procedure including the risks, benefits and alternatives for the proposed anesthesia with the patient or authorized representative who has indicated his/her understanding and acceptance.   Dental Advisory Given  Plan Discussed with: Anesthesiologist, CRNA and Surgeon  Anesthesia Plan Comments: (Plan to place magnet on AICD post induction/pre incision and remove pre emergence.  ICD representative reported that for this AICD we do not need to interrogate post magnet placement as long as the AICD functions normally during the case. Patient has cardiac clearance for this procedure.  Patient informed that they are higher risk for complications from anesthesia during this procedure due to their medical history.  Patient voiced understanding.  Patient consented for risks of anesthesia including but not limited to:  - adverse reactions to medications - damage to teeth, lips or other oral mucosa - sore throat or hoarseness - Damage to heart, brain, lungs or loss of life  Patient voiced  understanding.)       Anesthesia Quick Evaluation

## 2017-11-07 NOTE — Progress Notes (Signed)
Patient ID: Joseph [name], male   DOB: [birth year], 47 y.o.   MRN: [id number]   Patient tolerated procedure well. Due to his severe cardiac medical conditions will observe patient overnight for better monitoring. Will order Cardiology consult for post op recommendations.

## 2017-11-07 NOTE — Transfer of Care (Signed)
Immediate Anesthesia Transfer of Care Note  Patient: Joseph [name]  Procedure(s) Performed: LAPAROSCOPIC CHOLECYSTECTOMY (N/A )  Patient Location: PACU  Anesthesia Type:General  Level of Consciousness: awake, alert  and oriented  Airway & Oxygen Therapy: Patient Spontanous Breathing and Patient connected to nasal cannula oxygen  Post-op Assessment: Report given to RN and Post -op Vital signs reviewed and stable  Post vital signs: stable  Last Vitals:  Vitals:   11/07/17 0720 11/07/17 1017  BP: (!) 181/99 137/89  Pulse: 99 78  Resp: 17 (!) 9  Temp: 36.8 C (!) 36.1 C  SpO2: 100% 100%    Last Pain:  Vitals:   11/07/17 1017  TempSrc:   PainSc: Asleep         Complications: No apparent anesthesia complications

## 2017-11-07 NOTE — Consult Note (Signed)
[facility] Cardiology Consultation Note  Patient ID: Joseph [name], MRN: [id number], DOB/AGE: 11-15-1970 48 y.o. Admit date: 11/07/2017   Date of Consult: 11/07/2017 Primary Physician: [facility] [place] Primary Cardiologist: Tomasita Crumble nurse  Chief Complaint: No chief complaint on file.  Reason for Consult: Cardiomyopathy  HPI: 47 y.o. male with known coronary artery disease status post a significant myocardial infarction in anterior wall with ostial and proximal left anterior descending artery stenosis as well as stent placement.  The patient had significant LV systolic dysfunction thereafter requiring medication management defibrillator as well.  Since then the patient has been on appropriate medication management for LV systolic dysfunction ejection carvedilol furosemide losartan and Spironolactone.  The patient has functional class II congestive heart failure and his defibrillator has not had significant issues since implant.  Patient has done fairly well until recently any cholelithiasis or other intervention.  Perioperatively the patient had no evidence of congestive heart failure or anginal or other concerns at this time.  Patient is tolerating all of his continue medication management as per surgery and no cardiovascular symptoms  Past Medical History:  Diagnosis Date  . AICD (automatic cardioverter/defibrillator) present   . Coronary artery disease   . Diabetes mellitus without complication ([place])   . GERD (gastroesophageal reflux disease)   . Hyperlipidemia   . Hypertension   . Ischemic cardiomyopathy   . Myocardial infarction [facility]) 2011   anterior MI at [place] s/p BMS to ostial/proximal LAD  . Obstructive sleep apnea 2018  . Seizures ([place])    diabetes seizures (low blood sugar); last one 2016  . Systolic heart failure [facility])       Surgical History:  Past Surgical History:  Procedure Laterality Date  . CARDIAC DEFIBRILLATOR PLACEMENT  08/30/2010  . CORONARY  ANGIOPLASTY    . LEFT HEART CATH AND CORONARY ANGIOGRAPHY N/A 11/23/2016   Procedure: Left Heart Cath and Coronary Angiography;  Surgeon: Nelva Bush, MD;  Location: [place] [facility];  Service: Cardiovascular;  Laterality: N/A;     Home Meds: Prior to Admission medications   Medication Sig Start Date End Date Taking? Authorizing Provider  aspirin EC 81 MG tablet Take 81 mg by mouth every morning.   Yes [provider]  atorvastatin (LIPITOR) 80 MG tablet Take 80 mg by mouth at bedtime.  02/09/16  Yes [provider]  carvedilol (COREG) 25 MG tablet Take 25 mg by mouth 2 (two) times daily.  02/16/16  Yes [provider]  Cholecalciferol (D3-1000) 1000 units capsule Take 1,000 Units by mouth daily.   Yes [provider]  digoxin (LANOXIN) 0.125 MG tablet Take 125 mcg by mouth daily. 02/16/16  Yes [provider]  fluticasone (FLONASE) 50 MCG/ACT nasal spray Place 2 sprays into both nostrils daily as needed for allergies.  01/26/16  Yes [provider]  furosemide (LASIX) 20 MG tablet Take 1 tablet (20 mg total) by mouth 2 (two) times daily. 11/25/16  Yes Dustin Flock, MD  HUMALOG KWIKPEN 100 UNIT/ML KiwkPen Inject 18-22 Units into the skin See admin instructions. Inject 18 units subcutaneous in the morning, inject 20 units subcutaneous at lunchtime, and inject 22 units subcutaneous at night. Plus sliding scale as directed. 03/09/16  Yes [provider]  LANTUS SOLOSTAR 100 UNIT/ML Solostar Pen Inject 16-30 Units into the skin See admin instructions. Inject 30 units SQ in the morning and inject 16 units SQ in the evening 04/27/17  Yes [provider]  losartan (COZAAR) 100  MG tablet Take 100 mg by mouth at bedtime.   Yes [provider]  pantoprazole (PROTONIX) 40 MG tablet Take 1 tablet (40 mg total) by mouth daily. 04/21/16  Yes [name], MD  potassium chloride (MICRO-K) 10 MEQ CR capsule Take 10 mEq by  mouth daily. 02/23/16  Yes [provider]  spironolactone (ALDACTONE) 25 MG tablet Take 12.5 mg by mouth daily. 03/01/17  Yes [provider]    Inpatient Medications:  . atorvastatin  80 mg Oral QHS  . carvedilol  25 mg Oral BID  . cholecalciferol  1,000 Units Oral Daily  . [START ON 11/08/2017] digoxin  125 mcg Oral Daily  . [START ON 11/08/2017] enoxaparin (LOVENOX) injection  40 mg Subcutaneous Q24H  . furosemide  20 mg Oral BID  . insulin aspart  0-15 Units Subcutaneous Q4H  . insulin glargine  15 Units Subcutaneous BID  . insulin lispro  18-22 Units Subcutaneous See admin instructions  . losartan  100 mg Oral QHS  . pantoprazole  40 mg Oral Daily  . potassium chloride  10 mEq Oral Daily  . spironolactone  12.5 mg Oral Daily     Allergies:  Allergies  Allergen Reactions  . Vancomycin Shortness Of Breath  . Lisinopril Rash    Social History   Socioeconomic History  . Marital status: Divorced    Spouse name: Not on file  . Number of children: Not on file  . Years of education: Not on file  . Highest education level: Not on file  Social Needs  . Financial resource strain: Not on file  . Food insecurity - worry: Not on file  . Food insecurity - inability: Not on file  . Transportation needs - medical: Not on file  . Transportation needs - non-medical: Not on file  Occupational History  . Not on file  Tobacco Use  . Smoking status: Never Smoker  . Smokeless tobacco: Never Used  Substance and Sexual Activity  . Alcohol use: No  . Drug use: No  . Sexual activity: Yes  Other Topics Concern  . Not on file  Social History Narrative  . Not on file     Family History  Problem Relation Age of Onset  . Hypertension Mother   . Heart attack Paternal Grandmother   . Heart attack Father   . Bladder Cancer Neg Hx   . Kidney cancer Neg Hx   . Prostate cancer Neg Hx      Review of Systems Positive for abdominal discomfort Negative for: General:   chills, fever, night sweats or weight changes.  Cardiovascular: PND orthopnea syncope dizziness  Dermatological skin lesions rashes Respiratory: Cough congestion Urologic: Frequent urination urination at night and hematuria Abdominal: negative for nausea, vomiting, diarrhea, bright red blood per rectum, melena, or hematemesis Neurologic: negative for visual changes, and/or hearing changes  All other systems reviewed and are otherwise negative except as noted above.  Labs: No results for input(s): CKTOTAL, CKMB, TROPONINI in the last 72 hours. Lab Results  Component Value Date   WBC 8.2 02/24/2017   HGB 13.8 02/24/2017   HCT 40.3 02/24/2017   MCV 87.8 02/24/2017   PLT 131 (L) 02/24/2017    Recent Labs  Lab 11/07/17 1246  NA 137  K 3.8  CL 103  CO2 28  BUN 13  CREATININE 1.00  CALCIUM 8.3*  GLUCOSE 237*   Lab Results  Component Value Date   CHOL 128 11/25/2016   HDL 39 (L)  11/25/2016   LDLCALC 73 11/25/2016   TRIG 80 11/25/2016   No results found for: DDIMER  Radiology/Studies:  US Abdomen Complete  Result Date: 10/10/2017 CLINICAL DATA:  Right upper quadrant and generalized abdominal pain intermittently for the past several months. EXAM: ABDOMEN ULTRASOUND COMPLETE COMPARISON:  Right upper quadrant abdominal ultrasound of April 18, 2016 FINDINGS: Gallbladder: No gallstones or wall thickening visualized. No sonographic Murphy sign noted by sonographer. Common bile duct: Diameter: 5.6- 6.9 mm with the greatest diameter at the pancreatic head. The common bile duct was previously described as 8 mm in diameter. Liver: The hepatic echotexture is normal. There is no significant intrahepatic ductal dilation. There is no focal mass. The surface contour of the liver is normal. Portal vein is patent on color Doppler imaging with normal direction of blood flow towards the liver. IVC: No abnormality visualized. Pancreas: The pancreatic head and body are normal in echotexture and contour.  The pancreatic tail was largely obscured by bowel gas. Spleen: Size and appearance within normal limits. Right Kidney: Length: 12.9 cm. Echogenicity within normal limits. No mass or hydronephrosis visualized. Left Kidney: Length: 12.1 cm. Echogenicity within normal limits. No mass or hydronephrosis visualized. Abdominal aorta: No aneurysm visualized. Other findings: There is no ascites. IMPRESSION: No gallstones or sonographic evidence of acute cholecystitis. Persistent mild common bile duct dilation without obvious etiology. If there are clinical concerns of chronic gallbladder dysfunction, a nuclear medicine hepatobiliary scan with gallbladder ejection fraction determination may be useful. Normal appearance of the liver and visualized portions of the pancreas. No acute intra-abdominal abnormality is observed. Electronically Signed   By: David  Martinique M.D.   On: 10/10/2017 08:56   Nm Hepato W/eject Fract  Result Date: 10/10/2017 CLINICAL DATA:  Right upper quadrant pain and bloating. EXAM: NUCLEAR MEDICINE HEPATOBILIARY IMAGING WITH GALLBLADDER EF VIEWS: Anterior right upper quadrant RADIOPHARMACEUTICALS:  5.39 mCi Tc-7m  Choletec IV COMPARISON:  None. FINDINGS: Liver uptake of radiotracer is normal. There is prompt visualization of the small bowel, indicating patency of the common bile duct. Gallbladder initially visualizes after approximately 60 minutes, somewhat delayed. The patient consumed 6 ounces of Ensure orally with calculation of the computer generated ejection fraction of radiotracer from the gallbladder. The patient complained of mild pain after the oral Ensure consumption. The computer generated ejection fraction of radiotracer the gallbladder is abnormally low at 12%, normal greater than 33% using the oral agent. IMPRESSION: Relative delay in visualization of the gallbladder, suggesting chronic cholecystitis. Cystic duct is patent as gallbladder does visualize. Abnormally low ejection fraction  of radiotracer the gallbladder is felt to be indicative of a degree of biliary dyskinesia as well. Common bile duct is patent with prompt visualization of small bowel. Electronically Signed   By: Lowella Grip III M.D.   On: 10/10/2017 12:05    EKG: Normal sinus rhythm  Weights: Filed Weights   11/07/17 0720  Weight: 219 lb (99.3 kg)     Physical Exam: Blood pressure (!) 141/85, pulse 78, temperature 97.7 F (36.5 C), resp. rate 12, height 6\' 1"  (1.854 m), weight 219 lb (99.3 kg), SpO2 99 %. Body mass index is 28.89 kg/m. General: Well developed, well nourished, in no acute distress. Head eyes ears nose throat: Normocephalic, atraumatic, sclera non-icteric, no xanthomas, nares are without discharge. No apparent thyromegaly and/or mass  Lungs: Normal respiratory effort.  no wheezes, no rales, no rhonchi.  Heart: RRR with normal S1 S2. no murmur gallop, no rub, PMI is normal size and  placement, carotid upstroke normal without bruit, jugular venous pressure is normal Abdomen: Soft,  tender, non-distended with normoactive bowel sounds. No hepatomegaly. No rebound/guarding. No obvious abdominal masses. Abdominal aorta is normal size without bruit Extremities: No edema. no cyanosis, no clubbing, no ulcers  Peripheral : 2+ bilateral upper extremity pulses, 2+ bilateral femoral pulses, 2+ bilateral dorsal pedal pulse Neuro: Alert and oriented. No facial asymmetry. No focal deficit. Moves all extremities spontaneously. Musculoskeletal: Normal muscle tone without kyphosis Psych:  Responds to questions appropriately with a normal affect.    Assessment: 47 year old male with previous anterior apical myocardial infarction LV systolic dysfunction and class II symptoms with no exacerbation chest discomfort perioperatively doing well on appropriate medication management  Plan: 1.  Continue postoperative care without restriction 2.  Begin ambulation following for any significant symptoms 3.   Reinstatement of all appropriate previous medication management for cardiomyopathy including Aldactone losartan carvedilol and atorvastatin 4.  Reinstatement of aspirin when able status post surgery to reduce the possibility of bleeding complications 5.  Further cardiac diagnostics and/or intervention necessary at this time 6.  Okay for discharge home with follow-up in 1-2 weeks  Signed, Corey Skains M.D. [facility] 11/07/2017, 2:09 PM

## 2017-11-07 NOTE — Discharge Instructions (Addendum)

## 2017-11-07 NOTE — Anesthesia Procedure Notes (Signed)
Procedure Name: Intubation Date/Time: 11/07/2017 8:26 AM Performed by: Willette Alma, CRNA Pre-anesthesia Checklist: Patient identified, Emergency Drugs available, Suction available, Timeout performed and Patient being monitored Patient Re-evaluated:Patient Re-evaluated prior to induction Oxygen Delivery Method: Circle system utilized Preoxygenation: Pre-oxygenation with 100% oxygen Induction Type: IV induction Ventilation: Mask ventilation without difficulty Laryngoscope Size: 3 and McGraph Grade View: Grade I Tube type: Oral Tube size: 7.0 mm Number of attempts: 1 Airway Equipment and Method: Stylet Placement Confirmation: ETT inserted through vocal cords under direct vision,  positive ETCO2 and breath sounds checked- equal and bilateral Secured at: 24 cm Tube secured with: Tape Dental Injury: Teeth and Oropharynx as per pre-operative assessment

## 2017-11-08 ENCOUNTER — Encounter: Payer: Self-pay | Admitting: General Surgery

## 2017-11-08 DIAGNOSIS — K811 Chronic cholecystitis: Secondary | ICD-10-CM | POA: Diagnosis not present

## 2017-11-08 LAB — GLUCOSE, CAPILLARY
GLUCOSE-CAPILLARY: 171 mg/dL — AB (ref 65–99)
GLUCOSE-CAPILLARY: 198 mg/dL — AB (ref 65–99)
Glucose-Capillary: 108 mg/dL — ABNORMAL HIGH (ref 65–99)
Glucose-Capillary: 219 mg/dL — ABNORMAL HIGH (ref 65–99)

## 2017-11-08 MED ORDER — TRAMADOL HCL 50 MG PO TABS
50.0000 mg | ORAL_TABLET | Freq: Four times a day (QID) | ORAL | 0 refills | Status: AC | PRN
Start: 2017-11-08 — End: 2017-11-11

## 2017-11-08 NOTE — Discharge Summary (Signed)
Physician Discharge Summary  Patient ID: Joseph [name] MRN: [id number] DOB/AGE: [birth year] 47 y.o.  Admit date: 11/07/2017 Discharge date: 11/08/2017  Admission Diagnoses: Dyskinesia of the gallbladder Congestive Heart Failure  Implantable Cardioverter-Defibrillator History of myocardial infarction Ischemic Cardiomiopathy Diabetes Mellitus  Discharge Diagnoses:  Active Problems:   Dyskinesia of gallbladder Congestive Heart Failure  Implantable Cardioverter-Defibrillator History of myocardial infarction Ischemic Cardiomiopathy Diabetes Mellitus  Discharged Condition: good  Hospital Course: Patient underwent laparoscopic cholecystectomy. Tolerated procedure well. Admitted to observation due to severe cardiac medical history. No post op complication. Pain controlled. Tolerated diet. Ambulating.   Consults: cardiology  Significant Diagnostic Studies: None   Treatments: surgery: Laparoscopic Cholecystectomy  Discharge Exam: Blood pressure (!) 111/58, pulse 78, temperature 98.1 F (36.7 C), temperature source Oral, resp. rate 16, height 6\' 1"  (1.854 m), weight 99.3 kg (219 lb), SpO2 96 %. General: alert, active, oriented x3 Heart: regular rate and rhythm Abdomen: soft and depressible, wounds dry and clean. Non distended. Non tender Extremities: Pitting edema +1  Disposition: 01-Home or Self Care  Discharge Instructions    Diet - low sodium heart healthy   Complete by:  As directed    Increase activity slowly   Complete by:  As directed       Current Facility-Administered Medications (Endocrine & Metabolic):  .  insulin aspart (novoLOG) injection 0-15 Units .  insulin aspart (novoLOG) injection 18 Units .  insulin aspart (novoLOG) injection 20 Units .  insulin aspart (novoLOG) injection 22 Units .  insulin glargine (LANTUS) injection 15 Units   Current Facility-Administered Medications (Cardiovascular):  .  atorvastatin (LIPITOR) tablet 80 mg .  carvedilol (COREG)  tablet 25 mg .  digoxin (LANOXIN) tablet 125 mcg .  furosemide (LASIX) tablet 20 mg .  losartan (COZAAR) tablet 100 mg .  spironolactone (ALDACTONE) tablet 12.5 mg   Current Facility-Administered Medications (Respiratory):  .  fluticasone (FLONASE) 50 MCG/ACT nasal spray 2 spray   Current Facility-Administered Medications (Analgesics):  .  morphine 4 MG/ML injection 4 mg .  traMADol (ULTRAM) tablet 50 mg  Current Outpatient Medications (Analgesics):  .  traMADol (ULTRAM) 50 MG tablet, Take 1 tablet (50 mg total) by mouth every 6 (six) hours as needed for up to 3 days (mild pain).  Current Facility-Administered Medications (Hematological):  .  enoxaparin (LOVENOX) injection 40 mg   Current Facility-Administered Medications (Other):  .  cholecalciferol (VITAMIN D) tablet 1,000 Units .  ondansetron (ZOFRAN-ODT) disintegrating tablet 4 mg **OR** ondansetron (ZOFRAN) injection 4 mg .  pantoprazole (PROTONIX) EC tablet 40 mg .  potassium chloride (K-DUR) CR tablet 10 mEq  No current facility-administered medications on file prior to encounter.    Current Outpatient Medications on File Prior to Encounter  Medication Sig Dispense Refill  . aspirin EC 81 MG tablet Take 81 mg by mouth every morning.    Marland Kitchen atorvastatin (LIPITOR) 80 MG tablet Take 80 mg by mouth at bedtime.     . carvedilol (COREG) 25 MG tablet Take 25 mg by mouth 2 (two) times daily.     . digoxin (LANOXIN) 0.125 MG tablet Take 125 mcg by mouth daily.    . fluticasone (FLONASE) 50 MCG/ACT nasal spray Place 2 sprays into both nostrils daily as needed for allergies.     . furosemide (LASIX) 20 MG tablet Take 1 tablet (20 mg total) by mouth 2 (two) times daily. 30 tablet 0  . HUMALOG KWIKPEN 100 UNIT/ML KiwkPen Inject 18-22 Units into the skin See admin instructions.  Inject 18 units subcutaneous in the morning, inject 20 units subcutaneous at lunchtime, and inject 22 units subcutaneous at night. Plus sliding scale as directed.     Marland Kitchen LANTUS SOLOSTAR 100 UNIT/ML Solostar Pen Inject 16-30 Units into the skin See admin instructions. Inject 30 units SQ in the morning and inject 16 units SQ in the evening    . losartan (COZAAR) 100 MG tablet Take 100 mg by mouth at bedtime.    . pantoprazole (PROTONIX) 40 MG tablet Take 1 tablet (40 mg total) by mouth daily. 30 tablet 0  . potassium chloride (MICRO-K) 10 MEQ CR capsule Take 10 mEq by mouth daily.    Marland Kitchen spironolactone (ALDACTONE) 25 MG tablet Take 12.5 mg by mouth daily.     Follow-up Information    Herbert Pun, MD Follow up.   Specialty:  General Surgery Why:  Dr. Peyton Najjar Diaz's phone number is [phone] November 22, 2017 @ 9:00 am   Contact information: [place] [address] [phone]           Signed: Herbert Pun 11/08/2017, 8:51 AM

## 2017-11-08 NOTE — Progress Notes (Signed)
Pt was given D/C instructions and made aware of S/S of HF to make dr aware and to gradually go into activity since procedure was done on admission. He stated he understood and will comply. I removed his IV and his VS WDL. He was D/C to his fiance.

## 2017-11-08 NOTE — Final Progress Note (Signed)
[place] Hospital Day(s): 0.   Post op day(s): 1 Day Post-Op.   Interval History: Patient seen and examined, no acute events or new complaints overnight. Patient reports pain controlled, tolerated diet and was able to ambulate. Denies nausea or vomiting.  Vital signs in last 24 hours: [min-max] current  Temp:  [97 F (36.1 C)-98.3 F (36.8 C)] 98.1 F (36.7 C) (01/29 0428) Pulse Rate:  [74-83] 78 (01/29 0428) Resp:  [9-18] 16 (01/29 0428) BP: (111-150)/(58-97) 111/58 (01/29 0428) SpO2:  [94 %-100 %] 96 % (01/29 0428)     Height: 6\' 1"  (185.4 cm) Weight: 99.3 kg (219 lb) BMI (Calculated): 28.9   Physical Exam:  Constitutional: alert, cooperative and no distress  Respiratory: breathing non-labored at rest  Cardiovascular: regular rate and sinus rhythm  Gastrointestinal: soft, non-tender, and non-distended. Wounds are dry and clean.   Labs:  CBC Latest Ref Rng & Units 02/24/2017 12/02/2016 11/23/2016  WBC 3.8 - 10.6 K/uL 8.2 7.5 10.1  Hemoglobin 13.0 - 18.0 g/dL 13.8 14.6 15.3  Hematocrit 40.0 - 52.0 % 40.3 43.0 45.2  Platelets 150 - 440 K/uL 131(L) 134(L) 150   CMP Latest Ref Rng & Units 11/07/2017 02/24/2017 12/02/2016  Glucose 65 - 99 mg/dL 237(H) 272(H) 194(H)  BUN 6 - 20 mg/dL 13 16 12   Creatinine 0.61 - 1.24 mg/dL 1.00 0.69 0.73  Sodium 135 - 145 mmol/L 137 135 137  Potassium 3.5 - 5.1 mmol/L 3.8 3.8 3.8  Chloride 101 - 111 mmol/L 103 104 107  CO2 22 - 32 mmol/L 28 26 25   Calcium 8.9 - 10.3 mg/dL 8.3(L) 8.7(L) 8.5(L)  Total Protein 6.5 - 8.1 g/dL - - -  Total Bilirubin 0.3 - 1.2 mg/dL - - -  Alkaline Phos 38 - 126 U/L - - -  AST 15 - 41 U/L - - -  ALT 17 - 63 U/L - - -    Imaging studies: No new pertinent imaging studies  Assessment/Plan:  47 y.o. male with gallbladder dyskinesia complicated due to multiple cardiac conditions such as congestive heart failure with implantable cardioverted-defibrillator, ischemic cardiomyopathy, diabetes mellitus.  Patient underwent laparoscopic cholecystectomy. Tolerated procedure well. Admitted to observation due to severe cardiac medical history. No post op complication. Pain controlled. Tolerated diet. Ambulating. Patient oriented about lifting instructions, wound care, diet recommendations. Oriented to follow up with cardiologist, endocrinologist and gastroenterologit. I will follow him in 2 weeks.   All of the above findings and recommendations were discussed with the patient and the medical team, and all of patient's questions were answered to his expressed satisfaction.  [name], MD

## 2017-11-10 LAB — SURGICAL PATHOLOGY

## 2017-11-10 NOTE — Telephone Encounter (Signed)
Patient spoke to Dr. Zachery Dauer, physician who performed gall bladder surgery about having a prostate biopsy.  Physician gave him the okay to proceed.

## 2017-11-21 ENCOUNTER — Other Ambulatory Visit: Payer: Managed Care, Other (non HMO) | Admitting: Urology

## 2017-11-22 ENCOUNTER — Other Ambulatory Visit: Payer: Managed Care, Other (non HMO) | Admitting: Urology

## 2017-11-23 ENCOUNTER — Emergency Department
Admission: EM | Admit: 2017-11-23 | Discharge: 2017-11-23 | Disposition: A | Discharge status: Home / Self Care | Payer: Managed Care, Other (non HMO) | Attending: Emergency Medicine | Admitting: Emergency Medicine

## 2017-11-23 ENCOUNTER — Encounter: Payer: Self-pay | Admitting: Urology

## 2017-11-23 ENCOUNTER — Ambulatory Visit: Payer: Managed Care, Other (non HMO) | Admitting: Urology

## 2017-11-23 ENCOUNTER — Other Ambulatory Visit: Payer: Self-pay | Admitting: Urology

## 2017-11-23 ENCOUNTER — Other Ambulatory Visit: Payer: Self-pay

## 2017-11-23 VITALS — BP 121/73 | HR 81 | Ht 73.0 in | Wt 213.2 lb

## 2017-11-23 DIAGNOSIS — E162 Hypoglycemia, unspecified: Secondary | ICD-10-CM | POA: Insufficient documentation

## 2017-11-23 DIAGNOSIS — Z5321 Procedure and treatment not carried out due to patient leaving prior to being seen by health care provider: Secondary | ICD-10-CM | POA: Diagnosis not present

## 2017-11-23 DIAGNOSIS — R972 Elevated prostate specific antigen [PSA]: Secondary | ICD-10-CM | POA: Diagnosis not present

## 2017-11-23 LAB — GLUCOSE, CAPILLARY: Glucose-Capillary: 92 mg/dL (ref 65–99)

## 2017-11-23 MED ORDER — GENTAMICIN SULFATE 40 MG/ML IJ SOLN
80.0000 mg | Freq: Once | INTRAMUSCULAR | Status: AC
  Administered 2017-11-23: 80 mg via INTRAMUSCULAR

## 2017-11-23 MED ORDER — LEVOFLOXACIN 500 MG PO TABS
500.0000 mg | ORAL_TABLET | Freq: Once | ORAL | Status: AC
  Administered 2017-11-23: 500 mg via ORAL

## 2017-11-23 NOTE — ED Notes (Signed)
FIRST NURSE NOTE: Pts family reports a CBG at home of 15. Pt eating in lobby. Alert and oriented x 4. Juice provided. Pt reports he has been eating due to a biopsy earlier today.

## 2017-11-23 NOTE — ED Triage Notes (Signed)
Pt states he was fasting before a biopsy this morning and after did not eat and suddenly confused, checked glucose at home and it was 46, states on the way here drank a soda, took a glucose tablet and eat a bag of chips more juice and a candy bar. CBG now 96. Pt is a/ox4. Pt states he is on insulin for his diabetes and took it this morning.

## 2017-11-23 NOTE — Progress Notes (Signed)
Prostate Biopsy Procedure   Informed consent was obtained after discussing risks/benefits of the procedure.  A time out was performed to ensure correct patient identity.  Pre-Procedure: - Last PSA Level: 6.6 - Gentamicin given prophylactically - Levaquin 500 mg administered PO -Transrectal Ultrasound performed revealing a 43 gm prostate -No significant hypoechoic or median lobe noted  Procedure: - Prostate block performed using 10 cc 1% lidocaine and biopsies taken from sextant areas, a total of 12 under ultrasound guidance.  Post-Procedure: - Patient tolerated the procedure well - He was counseled to seek immediate medical attention if experiences any severe pain, significant bleeding, or fevers - Return in one week to discuss biopsy results

## 2017-11-28 ENCOUNTER — Other Ambulatory Visit: Payer: Self-pay | Admitting: Urology

## 2017-11-28 LAB — PATHOLOGY REPORT

## 2017-11-30 ENCOUNTER — Ambulatory Visit: Payer: Managed Care, Other (non HMO) | Admitting: Urology

## 2017-12-05 ENCOUNTER — Ambulatory Visit: Payer: Managed Care, Other (non HMO) | Admitting: Urology

## 2017-12-07 ENCOUNTER — Telehealth: Payer: Self-pay

## 2017-12-07 NOTE — Telephone Encounter (Signed)
-----   Message from Abbie Sons, MD sent at 12/04/2017  9:46 AM EST ----- Prostate biopsy showed no evidence of cancer.  Can cancel his biopsy results appointment.  Follow-up PSA/DRE 6 months.

## 2017-12-07 NOTE — Telephone Encounter (Signed)
Pt called stating he had a prostate bx a couple of weeks ago and continues to have blood in semen. Reinforced with pt that is normal and will continue for several months. Pt voiced understanding.

## 2018-01-24 IMAGING — DX DG CHEST 1V PORT
2 series · 2 of 2 positions shown · non-contrast
Comparison: 04/20/2016

CLINICAL DATA: Shortness of breath

EXAM:
PORTABLE CHEST 1 VIEW

[chest ap (1 of 2)]
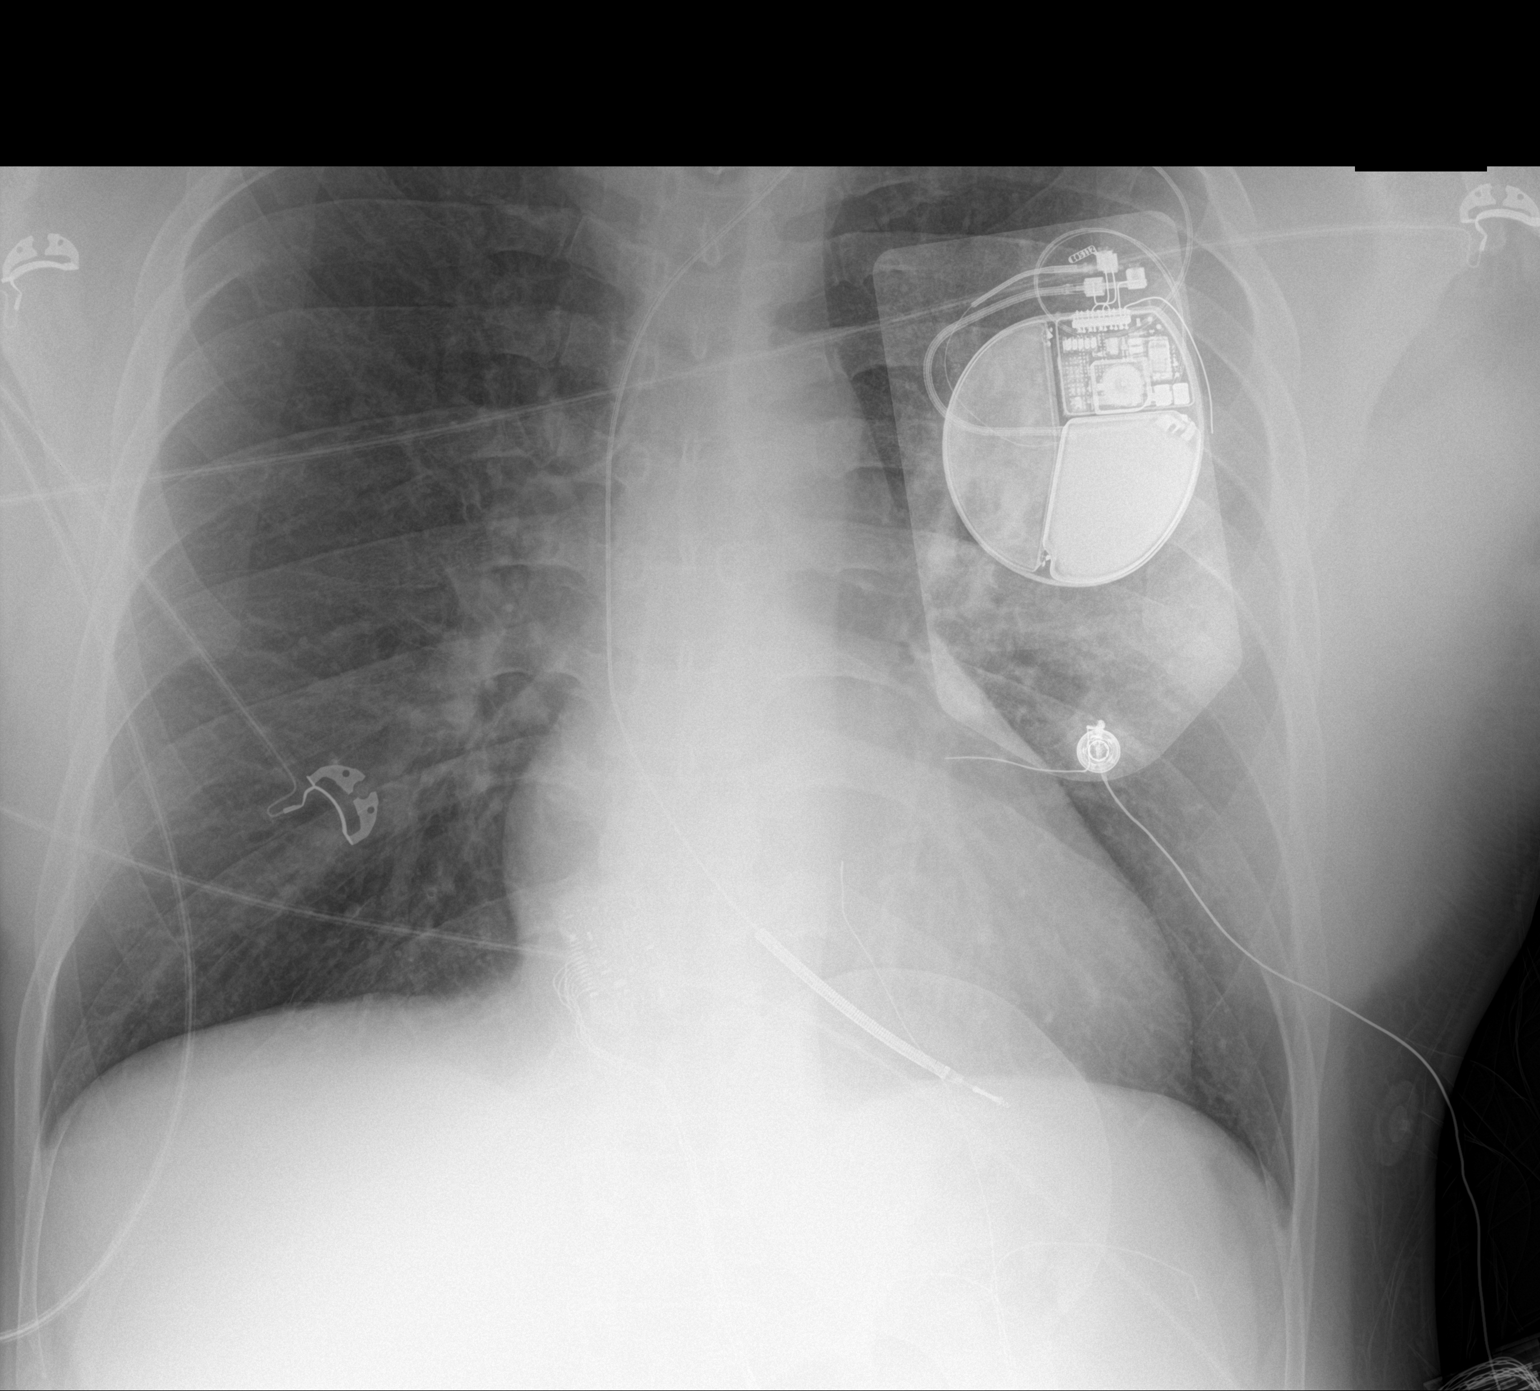

[chest ap (2 of 2)]
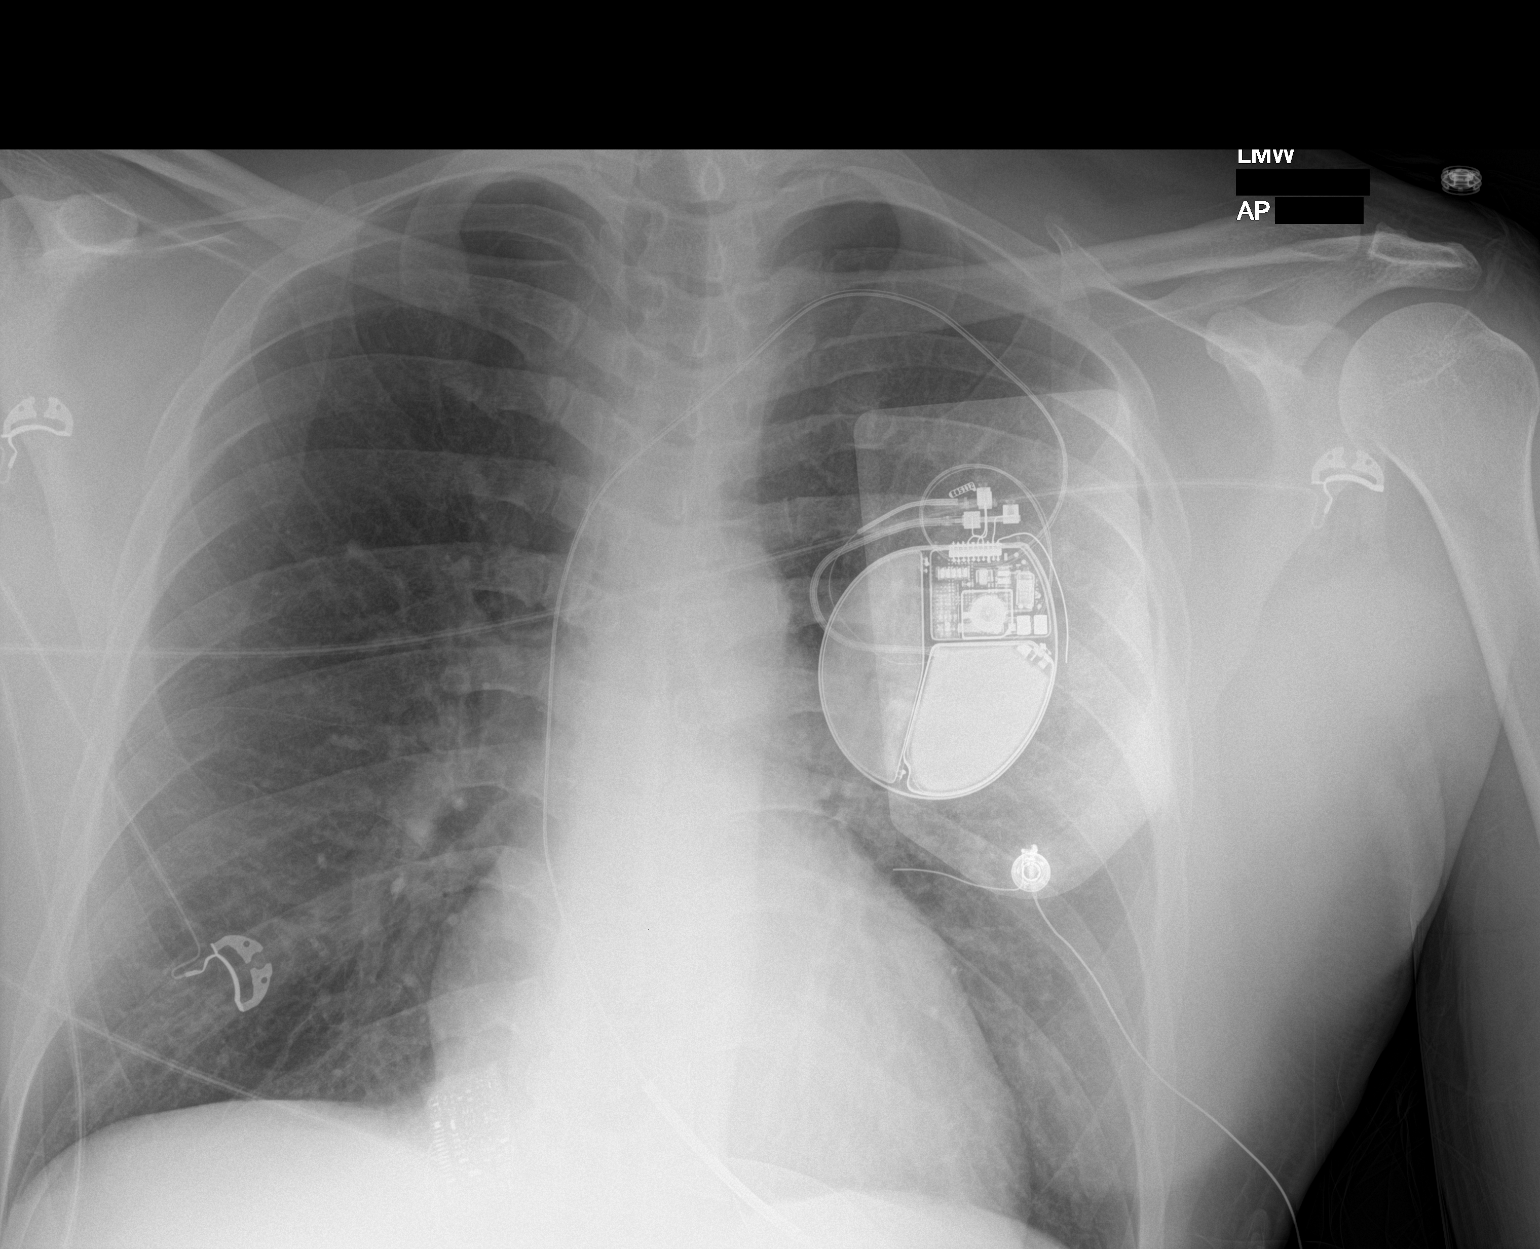

[2 of 2 positions shown; findings below may reference images not displayed]

FINDINGS: Cardiac pacemaker. Normal heart size and pulmonary vascularity. No
focal airspace disease or consolidation in the lungs. No blunting of
costophrenic angles. No pneumothorax. Mediastinal contours appear
intact.
IMPRESSION: No active disease.

## 2018-01-29 IMAGING — CR DG SINUSES COMPLETE 3+V
1 series · 4 of 4 positions shown · non-contrast
Comparison: CT 04/20/2010.

CLINICAL DATA: Recent sinus infections.

EXAM:
PARANASAL SINUSES - COMPLETE 3 + VIEW

[Series 1: dg sinuses complete · 0.14mm/px · 4 of 4 slices shown]
[im 1/4]
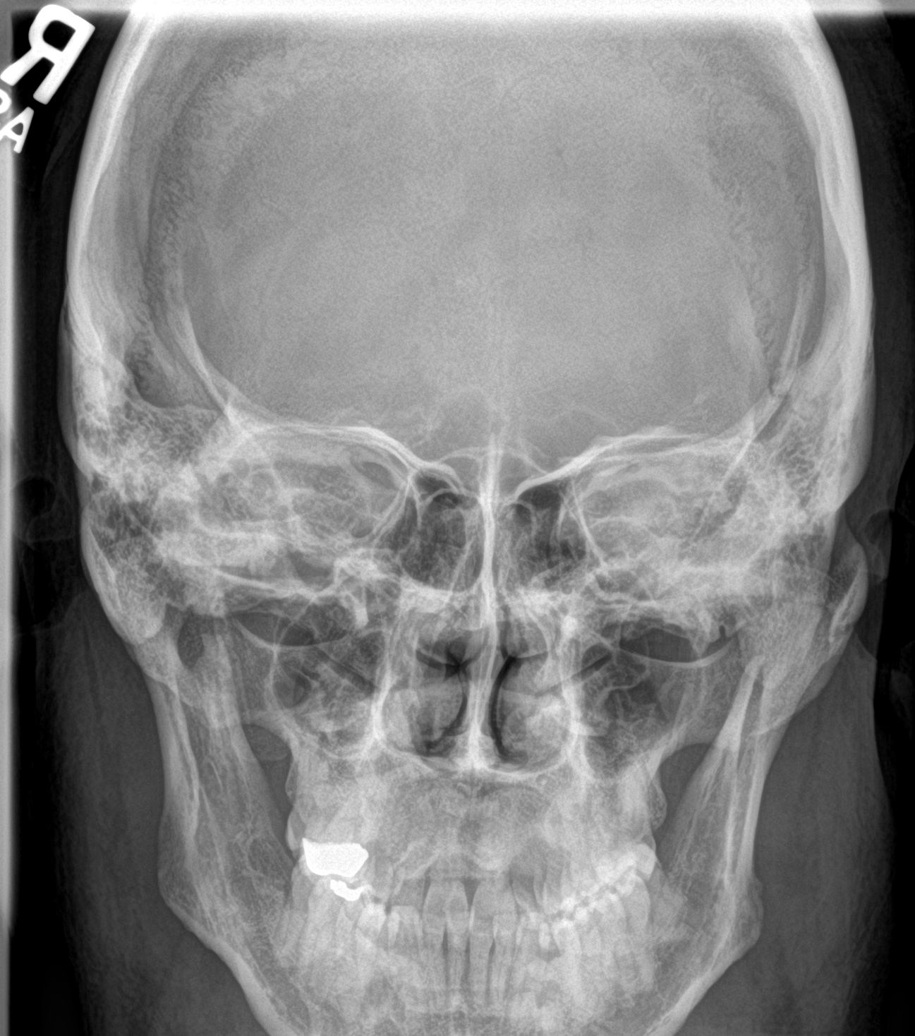
[im 2/4]
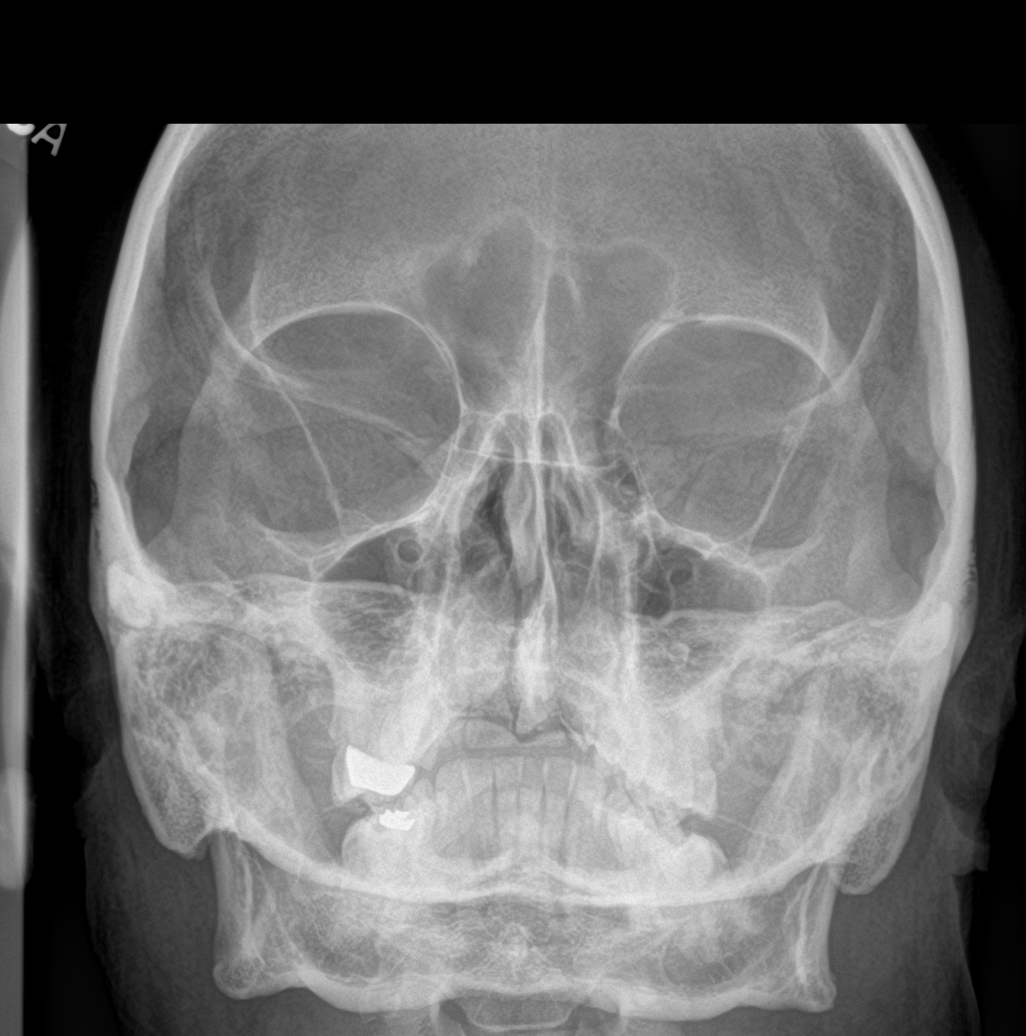
[im 3/4]
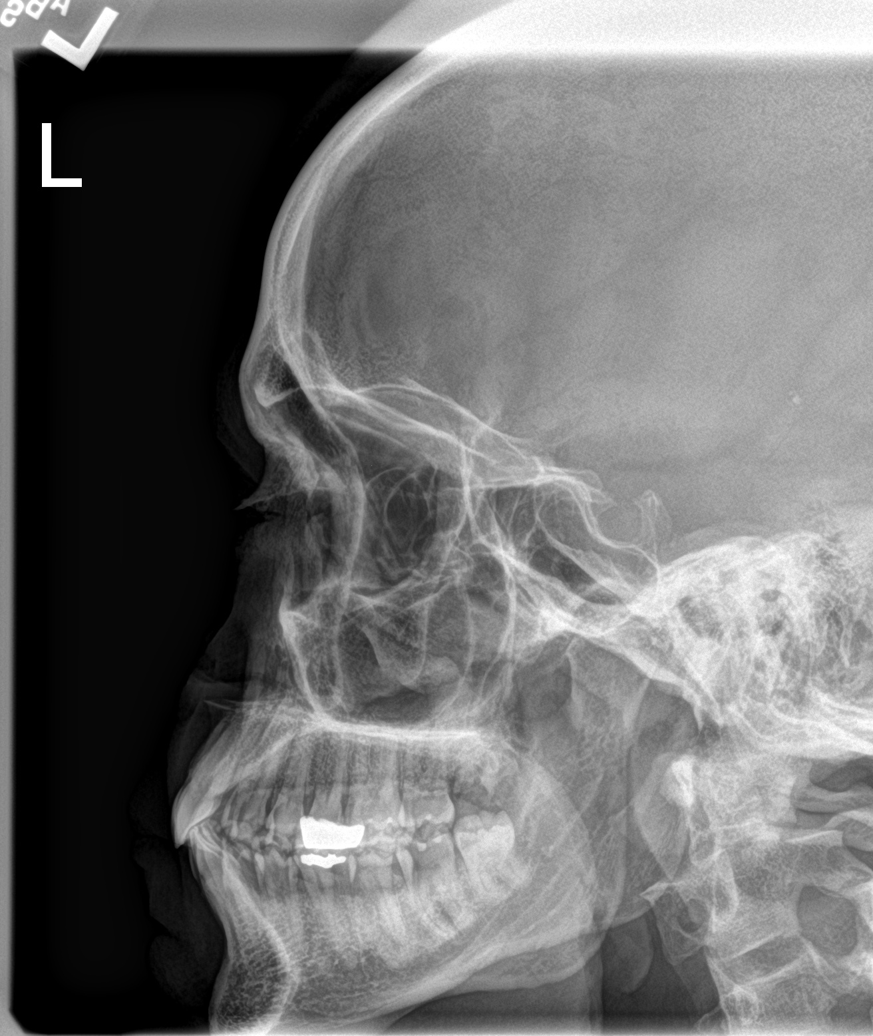
[im 4/4]
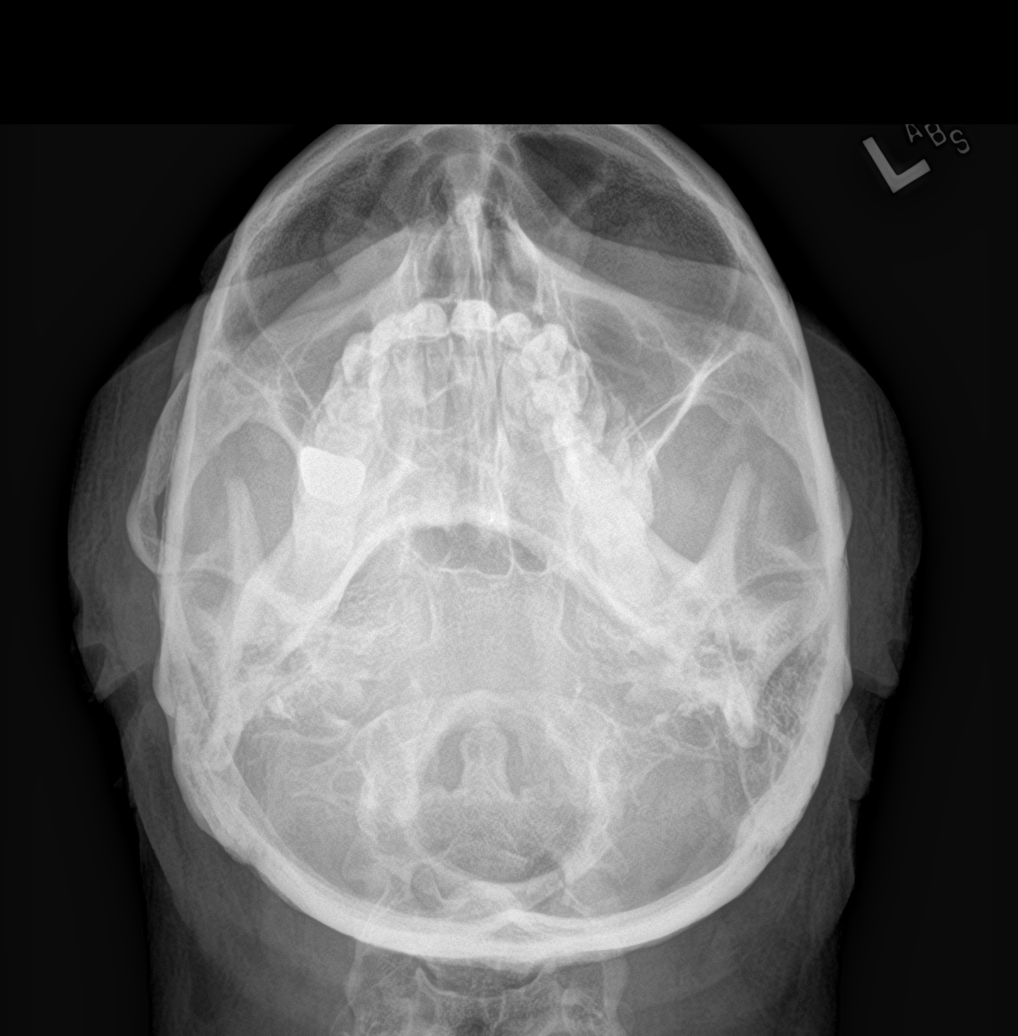

[4 of 4 positions shown; findings below may reference images not displayed]

FINDINGS: Paranasal sinuses are clear. Mastoids are clear. No acute bony
abnormality identified. If symptoms persist maxillofacial CT can be
obtained.
IMPRESSION: No acute abnormality.

## 2018-03-17 ENCOUNTER — Encounter: Payer: Self-pay | Admitting: *Deleted

## 2018-03-20 ENCOUNTER — Ambulatory Visit
Admission: RE | Admit: 2018-03-20 | Discharge: 2018-03-20 | Disposition: A | Discharge status: Home / Self Care | Payer: Managed Care, Other (non HMO) | Source: Ambulatory Visit | Attending: Unknown Physician Specialty | Admitting: Unknown Physician Specialty

## 2018-03-20 ENCOUNTER — Ambulatory Visit: Payer: Managed Care, Other (non HMO) | Admitting: Certified Registered Nurse Anesthetist

## 2018-03-20 ENCOUNTER — Encounter
Admission: RE | Disposition: A | Discharge status: Home / Self Care | Payer: Self-pay | Source: Ambulatory Visit | Attending: Unknown Physician Specialty

## 2018-03-20 ENCOUNTER — Encounter: Payer: Self-pay | Admitting: Anesthesiology

## 2018-03-20 DIAGNOSIS — I2510 Atherosclerotic heart disease of native coronary artery without angina pectoris: Secondary | ICD-10-CM | POA: Insufficient documentation

## 2018-03-20 DIAGNOSIS — I255 Ischemic cardiomyopathy: Secondary | ICD-10-CM | POA: Diagnosis not present

## 2018-03-20 DIAGNOSIS — Z79899 Other long term (current) drug therapy: Secondary | ICD-10-CM | POA: Diagnosis not present

## 2018-03-20 DIAGNOSIS — I252 Old myocardial infarction: Secondary | ICD-10-CM | POA: Diagnosis not present

## 2018-03-20 DIAGNOSIS — K2960 Other gastritis without bleeding: Secondary | ICD-10-CM | POA: Insufficient documentation

## 2018-03-20 DIAGNOSIS — G4733 Obstructive sleep apnea (adult) (pediatric): Secondary | ICD-10-CM | POA: Insufficient documentation

## 2018-03-20 DIAGNOSIS — K2950 Unspecified chronic gastritis without bleeding: Secondary | ICD-10-CM | POA: Diagnosis not present

## 2018-03-20 DIAGNOSIS — E785 Hyperlipidemia, unspecified: Secondary | ICD-10-CM | POA: Diagnosis not present

## 2018-03-20 DIAGNOSIS — Z95810 Presence of automatic (implantable) cardiac defibrillator: Secondary | ICD-10-CM | POA: Insufficient documentation

## 2018-03-20 DIAGNOSIS — K2980 Duodenitis without bleeding: Secondary | ICD-10-CM | POA: Insufficient documentation

## 2018-03-20 DIAGNOSIS — Z794 Long term (current) use of insulin: Secondary | ICD-10-CM | POA: Insufficient documentation

## 2018-03-20 DIAGNOSIS — K219 Gastro-esophageal reflux disease without esophagitis: Secondary | ICD-10-CM | POA: Diagnosis not present

## 2018-03-20 DIAGNOSIS — I5020 Unspecified systolic (congestive) heart failure: Secondary | ICD-10-CM | POA: Diagnosis not present

## 2018-03-20 DIAGNOSIS — R1013 Epigastric pain: Secondary | ICD-10-CM | POA: Insufficient documentation

## 2018-03-20 DIAGNOSIS — I110 Hypertensive heart disease with heart failure: Secondary | ICD-10-CM | POA: Diagnosis not present

## 2018-03-20 DIAGNOSIS — Z7982 Long term (current) use of aspirin: Secondary | ICD-10-CM | POA: Diagnosis not present

## 2018-03-20 HISTORY — DX: Heart failure, unspecified: I50.9

## 2018-03-20 HISTORY — PX: ESOPHAGOGASTRODUODENOSCOPY (EGD) WITH PROPOFOL: SHX5813

## 2018-03-20 HISTORY — DX: Male erectile dysfunction, unspecified: N52.9

## 2018-03-20 HISTORY — DX: Vitamin D deficiency, unspecified: E55.9

## 2018-03-20 HISTORY — DX: Other specified diabetes mellitus without complications: E13.9

## 2018-03-20 LAB — GLUCOSE, CAPILLARY: GLUCOSE-CAPILLARY: 213 mg/dL — AB (ref 65–99)

## 2018-03-20 SURGERY — ESOPHAGOGASTRODUODENOSCOPY (EGD) WITH PROPOFOL
Anesthesia: General

## 2018-03-20 MED ORDER — FENTANYL CITRATE (PF) 100 MCG/2ML IJ SOLN
INTRAMUSCULAR | Status: AC
  Filled 2018-03-20: qty 2

## 2018-03-20 MED ORDER — GLYCOPYRROLATE 0.2 MG/ML IJ SOLN
INTRAMUSCULAR | Status: AC
  Filled 2018-03-20: qty 1

## 2018-03-20 MED ORDER — SODIUM CHLORIDE 0.9 % IV SOLN
INTRAVENOUS | Status: DC
  Administered 2018-03-20: 1000 mL via INTRAVENOUS

## 2018-03-20 MED ORDER — PROPOFOL 10 MG/ML IV BOLUS
INTRAVENOUS | Status: DC | PRN
  Administered 2018-03-20: 50 mg via INTRAVENOUS
  Administered 2018-03-20 (×2): 10 mg via INTRAVENOUS

## 2018-03-20 MED ORDER — LIDOCAINE HCL (PF) 2 % IJ SOLN
INTRAMUSCULAR | Status: AC
  Filled 2018-03-20: qty 10

## 2018-03-20 MED ORDER — LIDOCAINE HCL (CARDIAC) PF 100 MG/5ML IV SOSY
PREFILLED_SYRINGE | INTRAVENOUS | Status: DC | PRN
  Administered 2018-03-20: 100 mg via INTRAVENOUS

## 2018-03-20 MED ORDER — PROPOFOL 500 MG/50ML IV EMUL
INTRAVENOUS | Status: AC
  Filled 2018-03-20: qty 50

## 2018-03-20 MED ORDER — FENTANYL CITRATE (PF) 100 MCG/2ML IJ SOLN
INTRAMUSCULAR | Status: DC | PRN
  Administered 2018-03-20: 100 ug via INTRAVENOUS

## 2018-03-20 MED ORDER — LIDOCAINE HCL (PF) 1 % IJ SOLN
2.0000 mL | Freq: Once | INTRAMUSCULAR | Status: AC
  Administered 2018-03-20: 0.3 mL via INTRADERMAL

## 2018-03-20 MED ORDER — PROPOFOL 500 MG/50ML IV EMUL
INTRAVENOUS | Status: DC | PRN
  Administered 2018-03-20: 140 ug/kg/min via INTRAVENOUS

## 2018-03-20 MED ORDER — LIDOCAINE HCL (PF) 1 % IJ SOLN
INTRAMUSCULAR | Status: AC
  Administered 2018-03-20: 0.3 mL via INTRADERMAL
  Filled 2018-03-20: qty 2

## 2018-03-20 MED ORDER — SODIUM CHLORIDE 0.9 % IV SOLN: INTRAVENOUS | Status: DC

## 2018-03-20 NOTE — Anesthesia Postprocedure Evaluation (Signed)
Anesthesia Post Note  Patient: Joseph [name]  Procedure(s) Performed: ESOPHAGOGASTRODUODENOSCOPY (EGD) WITH PROPOFOL (N/A )  Patient location during evaluation: Endoscopy Anesthesia Type: General Level of consciousness: awake and alert Pain management: pain level controlled Vital Signs Assessment: post-procedure vital signs reviewed and stable Respiratory status: spontaneous breathing, nonlabored ventilation, respiratory function stable and patient connected to nasal cannula oxygen Cardiovascular status: blood pressure returned to baseline and stable Postop Assessment: no apparent nausea or vomiting Anesthetic complications: no     Last Vitals:  Vitals:   03/20/18 1131 03/20/18 1211  BP:  138/70  Pulse:    Resp: 18   Temp: (!) 36.1 C   SpO2:      Last Pain:  Vitals:   03/20/18 1221  TempSrc:   PainSc: 0-No pain                 Martha Clan

## 2018-03-20 NOTE — Anesthesia Preprocedure Evaluation (Signed)
Anesthesia Evaluation  Patient identified by MRN, date of birth, ID band Patient awake    Reviewed: Allergy & Precautions, H&P , NPO status , Patient's Chart, lab work & pertinent test results  History of Anesthesia Complications Negative for: history of anesthetic complications  Airway Mallampati: III  TM Distance: >3 FB Neck ROM: full    Dental  (+) Chipped, Poor Dentition   Pulmonary neg shortness of breath, sleep apnea , pneumonia,           Cardiovascular Exercise Tolerance: Good hypertension, + CAD, + Past MI, + Cardiac Stents and +CHF  (-) CABG, (-) DOE and (-) DVT (-) dysrhythmias + Cardiac Defibrillator (-) Valvular Problems/Murmurs     Neuro/Psych Seizures -, Well Controlled,  negative psych ROS   GI/Hepatic Neg liver ROS, GERD  Medicated and Controlled,  Endo/Other  diabetes, Type 2  Renal/GU      Musculoskeletal   Abdominal   Peds  Hematology negative hematology ROS (+)   Anesthesia Other Findings Past Medical History: No date: AICD (automatic cardioverter/defibrillator) present No date: Coronary artery disease No date: Diabetes mellitus without complication (HCC) No date: GERD (gastroesophageal reflux disease) No date: Hyperlipidemia No date: Hypertension No date: Ischemic cardiomyopathy 2011: Myocardial infarction [facility])     Comment:  anterior MI at [facility] s/p BMS to ostial/proximal LAD 2018: Obstructive sleep apnea No date: Seizures ([place])     Comment:  diabetes seizures (low blood sugar); last one 7782 No date: Systolic heart failure [facility])  Past Surgical History: 08/30/2010: CARDIAC DEFIBRILLATOR PLACEMENT No date: CORONARY ANGIOPLASTY 11/23/2016: LEFT HEART CATH AND CORONARY ANGIOGRAPHY; N/A     Comment:  Procedure: Left Heart Cath and Coronary Angiography;                Surgeon: Nelva Bush, MD;  Location: [place] CV              LAB;  Service: Cardiovascular;  Laterality:  N/A;  BMI    Body Mass Index:  28.89 kg/m      Reproductive/Obstetrics negative OB ROS                             Anesthesia Physical  Anesthesia Plan  ASA: IV  Anesthesia Plan: General   Post-op Pain Management:    Induction: Intravenous  PONV Risk Score and Plan: 2 and Propofol infusion  Airway Management Planned: Nasal Cannula  Additional Equipment:   Intra-op Plan:   Post-operative Plan:   Informed Consent: I have reviewed the patients History and Physical, chart, labs and discussed the procedure including the risks, benefits and alternatives for the proposed anesthesia with the patient or authorized representative who has indicated his/her understanding and acceptance.   Dental Advisory Given  Plan Discussed with: Anesthesiologist, CRNA and Surgeon  Anesthesia Plan Comments: (Plan to place magnet on AICD post induction/pre incision and remove pre emergence.  ICD representative reported that for this AICD we do not need to interrogate post magnet placement as long as the AICD functions normally during the case. Patient has cardiac clearance for this procedure.  Patient informed that they are higher risk for complications from anesthesia during this procedure due to their medical history.  Patient voiced understanding.  Patient consented for risks of anesthesia including but not limited to:  - adverse reactions to medications - damage to teeth, lips or other oral mucosa - sore throat or hoarseness - Damage to heart, brain, lungs or  loss of life  Patient voiced understanding.)        Anesthesia Quick Evaluation

## 2018-03-20 NOTE — H&P (Signed)
Primary Care Physician:  [facility] Primary Gastroenterologist:  Dr. Vira Agar  Pre-Procedure History & Physical: HPI:  Joseph [name] is a 47 y.o. male is here for an endoscopy.  Done for RUQ abd pain.   Past Medical History:  Diagnosis Date  . AICD (automatic cardioverter/defibrillator) present   . CHF (congestive heart failure) ([place])   . Coronary artery disease   . Diabetes mellitus without complication ([place])   . ED (erectile dysfunction)   . GERD (gastroesophageal reflux disease)   . Hyperlipidemia   . Hypertension   . Ischemic cardiomyopathy   . LADA (latent autoimmune diabetes in adults), managed as type 1 ([place])   . Myocardial infarction [facility]) 2011   anterior MI at [place] s/p BMS to ostial/proximal LAD  . Obstructive sleep apnea 2018  . Seizures ([place])    diabetes seizures (low blood sugar); last one 2016  . Systolic heart failure ([place])   . Vitamin D deficiency     Past Surgical History:  Procedure Laterality Date  . CARDIAC DEFIBRILLATOR PLACEMENT  08/30/2010  . CHOLECYSTECTOMY N/A 11/07/2017   Procedure: LAPAROSCOPIC CHOLECYSTECTOMY;  Surgeon: Herbert Pun, MD;  Location: [facility] ORS;  Service: General;  Laterality: N/A;  . CORONARY ANGIOPLASTY    . LEFT HEART CATH AND CORONARY ANGIOGRAPHY N/A 11/23/2016   Procedure: Left Heart Cath and Coronary Angiography;  Surgeon: Nelva Bush, MD;  Location: [place] CV LAB;  Service: Cardiovascular;  Laterality: N/A;    Prior to Admission medications   Medication Sig Start Date End Date Taking? Authorizing Provider  loratadine (CLARITIN) 10 MG tablet Take 10 mg by mouth daily.   Yes [provider]  metoCLOPramide (REGLAN) 10 MG tablet Take 10 mg by mouth 4 (four) times daily.   Yes [provider]  aspirin EC 81 MG tablet Take 81 mg by mouth every morning.    [provider]  atorvastatin (LIPITOR) 80 MG tablet Take 80 mg by mouth at bedtime.  02/09/16   [provider]  carvedilol (COREG) 25 MG tablet Take 25 mg by mouth 2 (two) times daily.  02/16/16   [provider]  Cholecalciferol (D3-1000) 1000 units capsule Take 1,000 Units by mouth daily.    [provider]  digoxin (LANOXIN) 0.125 MG tablet Take 125 mcg by mouth daily. 02/16/16   [provider]  fluticasone (FLONASE) 50 MCG/ACT nasal spray Place 2 sprays into both nostrils daily as needed for allergies.  01/26/16   [provider]  furosemide (LASIX) 20 MG tablet Take 1 tablet (20 mg total) by mouth 2 (two) times daily. 11/25/16   Dustin Flock, MD  HUMALOG KWIKPEN 100 UNIT/ML KiwkPen Inject 18-22 Units into the skin [name] admin instructions. Inject 18 units subcutaneous in the morning, inject 20 units subcutaneous at lunchtime, and inject 22 units subcutaneous at night. Plus sliding scale as directed. 03/09/16   [provider]  LANTUS SOLOSTAR 100 UNIT/ML Solostar Pen Inject 16-30 Units into the skin [name] admin instructions. Inject 30 units SQ in the morning and inject 16 units SQ in the evening 04/27/17   [provider]  losartan (COZAAR) 100 MG tablet Take 100 mg by mouth at bedtime.    [provider]  pantoprazole (PROTONIX) 40 MG tablet Take 1 tablet (40 mg total) by mouth daily. 04/21/16   [name], MD  potassium chloride (MICRO-K) 10 MEQ CR capsule Take 10 mEq by mouth daily. 02/23/16   [provider]  spironolactone (ALDACTONE) 25 MG tablet Take 12.5 mg by mouth daily. 03/01/17   [provider]    Allergies as of 02/01/2018 - Review Complete 11/23/2017  Allergen Reaction Noted  . Vancomycin Shortness Of Breath 04/18/2016  . Lisinopril Rash 04/18/2016    Family History  Problem Relation Age of Onset  . Hypertension Mother   . Heart attack Paternal Grandmother   . Heart attack Father   . Bladder Cancer Neg Hx   . Kidney cancer Neg Hx   . Prostate cancer Neg Hx     Social History    Socioeconomic History  . Marital status: Divorced    Spouse name: Not on file  . Number of children: Not on file  . Years of education: Not on file  . Highest education level: Not on file  Occupational History  . Not on file  Social Needs  . Financial resource strain: Not on file  . Food insecurity:    Worry: Not on file    Inability: Not on file  . Transportation needs:    Medical: Not on file    Non-medical: Not on file  Tobacco Use  . Smoking status: Never Smoker  . Smokeless tobacco: Never Used  Substance and Sexual Activity  . Alcohol use: No  . Drug use: No  . Sexual activity: Yes  Lifestyle  . Physical activity:    Days per week: Not on file    Minutes per session: Not on file  . Stress: Not on file  Relationships  . Social connections:    Talks on phone: Not on file    Gets together: Not on file    Attends religious service: Not on file    Active member of club or organization: Not on file    Attends meetings of clubs or organizations: Not on file    Relationship status: Not on file  . Intimate partner violence:    Fear of current or ex partner: Not on file    Emotionally abused: Not on file    Physically abused: Not on file    Forced sexual activity: Not on file  Other Topics Concern  . Not on file  Social History Narrative  . Not on file    Review of Systems: [name] HPI, otherwise negative ROS  Physical Exam: BP 135/86   Pulse 69   Temp (!) 96.8 F (36 C) (Tympanic)   Resp 17   Ht 6\' 1"  (1.854 m)   Wt 96.6 kg (213 lb)   SpO2 100%   BMI 28.10 kg/m  General:   Alert,  pleasant and cooperative in NAD Head:  Normocephalic and atraumatic. Neck:  Supple; no masses or thyromegaly. Lungs:  Clear throughout to auscultation.    Heart:  Regular rate and rhythm. Abdomen:  Soft, nontender and nondistended. Normal bowel sounds, without guarding, and without rebound.   Neurologic:  Alert and  oriented x4;  grossly normal  neurologically.  Impression/Plan: Joseph [name] is here for an endoscopy to be performed for RUQ abd pain.  Risks, benefits, limitations, and alternatives regarding  endoscopy have been reviewed with the patient.  Questions have been answered.  All parties agreeable.   Gaylyn Cheers, MD  03/20/2018, 11:09 AM

## 2018-03-20 NOTE — Op Note (Addendum)
[facility] Gastroenterology Patient Name: Joseph [name] Procedure Date: 03/20/2018 11:14 AM MRN: [id number] Account #: [id number] Date of Birth: [birth year] Admit Type: Outpatient Age: 47 Room:  [facility] ENDO ROOM 3 Gender: Male Note Status: Finalized Procedure:            Upper GI endoscopy Indications:          Epigastric abdominal pain, Abdominal pain in the right                        upper quadrant Providers:            Manya Silvas, MD Referring MD:         Thornhill, MD (Referring MD) Medicines:            Propofol per Anesthesia Complications:        No immediate complications. Procedure:            Pre-Anesthesia Assessment:                       - After reviewing the risks and benefits, the patient                        was deemed in satisfactory condition to undergo the                        procedure.                       After obtaining informed consent, the endoscope was                        passed under direct vision. Throughout the procedure,                        the patient's blood pressure, pulse, and oxygen                        saturations were monitored continuously. The Endoscope                        was introduced through the mouth, and advanced to the                        second part of duodenum. The upper GI endoscopy was                        accomplished without difficulty. The patient tolerated                        the procedure well. Findings:      LA Grade C (one or more mucosal breaks continuous between tops of 2 or       more mucosal folds, less than 75% circumference) esophagitis with no       bleeding was found 43 cm from the incisors. Biopsies were taken with a       cold forceps for histology.      Diffuse and patchy mild inflammation characterized by erythema and       granularity was found in the gastric body and in the prepyloric region       of the stomach. Biopsies  were taken with a  cold forceps for histology.       Biopsies were taken with a cold forceps for Helicobacter pylori testing.      Patchy mild inflammation characterized by erythema and granularity was       found in the duodenal bulb. Biopsies were taken with a cold forceps for       histology. Biopsies were taken with a cold forceps for Helicobacter       pylori testing.      Patchy mildly erythematous mucosa without active bleeding and with no       stigmata of bleeding was found in the second portion of the duodenum.       Biopsies were taken with a cold forceps for histology. Impression:           - LA Grade C reflux esophagitis. Rule out Barrett's                        esophagus. Biopsied.                       - Acute gastritis. Biopsied.                       - Duodenitis. Biopsied.                       - Erythematous duodenopathy. Biopsied. Recommendation:       - Await pathology results. You need to take medication                        for stomach and duodenum and esophagus. Manya Silvas, MD 03/20/2018 11:55:21 AM This report has been signed electronically. Number of Addenda: 0 Note Initiated On: 03/20/2018 11:14 AM      [facility]

## 2018-03-20 NOTE — Anesthesia Procedure Notes (Signed)
Performed by: Keno Caraway, CRNA Pre-anesthesia Checklist: Patient identified, Emergency Drugs available, Suction available, Patient being monitored and Timeout performed Patient Re-evaluated:Patient Re-evaluated prior to induction Oxygen Delivery Method: Nasal cannula Induction Type: IV induction       

## 2018-03-20 NOTE — Anesthesia Post-op Follow-up Note (Signed)
Anesthesia QCDR form completed.        

## 2018-03-20 NOTE — Transfer of Care (Signed)
Immediate Anesthesia Transfer of Care Note  Patient: Joseph [name]  Procedure(s) Performed: ESOPHAGOGASTRODUODENOSCOPY (EGD) WITH PROPOFOL (N/A )  Patient Location: PACU  Anesthesia Type:General  Level of Consciousness: awake, alert  and oriented  Airway & Oxygen Therapy: Patient Spontanous Breathing and Patient connected to nasal cannula oxygen  Post-op Assessment: Report given to RN and Post -op Vital signs reviewed and stable  Post vital signs: Reviewed and stable  Last Vitals:  Vitals Value Taken Time  BP    Temp    Pulse 73 03/20/2018 11:31 AM  Resp    SpO2 98 % 03/20/2018 11:31 AM  Vitals shown include unvalidated device data.  Last Pain:  Vitals:   03/20/18 1036  TempSrc: Tympanic  PainSc: 0-No pain         Complications: No apparent anesthesia complications

## 2018-03-21 ENCOUNTER — Encounter: Payer: Self-pay | Admitting: Unknown Physician Specialty

## 2018-03-22 LAB — SURGICAL PATHOLOGY

## 2018-05-29 ENCOUNTER — Other Ambulatory Visit: Payer: Managed Care, Other (non HMO)

## 2018-05-29 DIAGNOSIS — R972 Elevated prostate specific antigen [PSA]: Secondary | ICD-10-CM

## 2018-05-30 LAB — FPSA% REFLEX
% FREE PSA: 9.1 %
PSA, FREE: 0.7 ng/mL

## 2018-05-30 LAB — PSA TOTAL (REFLEX TO FREE): Prostate Specific Ag, Serum: 7.7 ng/mL — ABNORMAL HIGH (ref 0.0–4.0)

## 2018-06-05 ENCOUNTER — Encounter: Payer: Self-pay | Admitting: Urology

## 2018-06-05 ENCOUNTER — Ambulatory Visit: Payer: Managed Care, Other (non HMO) | Admitting: Urology

## 2018-06-05 VITALS — BP 131/74 | HR 84 | Ht 73.0 in | Wt 212.2 lb

## 2018-06-05 DIAGNOSIS — R972 Elevated prostate specific antigen [PSA]: Secondary | ICD-10-CM

## 2018-06-06 NOTE — Progress Notes (Signed)
06/05/2018 11:44 AM   [name] 06-Dec-1970 [id number]  Referring provider: [facility], [facility], [place] [address]  Chief Complaint  Patient presents with  . Follow-up   Urologic history: 1. Elevated PSA -TRUS/biopsy 11/2017 for PSA of 6.6 and benign DRE.  Prostate volume 43 g.  Pathology: Benign prostate tissue  2. Erectile dysfunction - on tri-mix  HPI: 47 year old male presents for follow-up of an elevated PSA.  He has had no problems since his last visit.  A repeat PSA on 05/29/2018 was 7.7 with a percent free PSA of 9.1.  He denies bothersome lower urinary tract symptoms.   PMH: Past Medical History:  Diagnosis Date  . AICD (automatic cardioverter/defibrillator) present   . CHF (congestive heart failure) ([place])   . Coronary artery disease   . Diabetes mellitus without complication ([place])   . ED (erectile dysfunction)   . GERD (gastroesophageal reflux disease)   . Hyperlipidemia   . Hypertension   . Ischemic cardiomyopathy   . LADA (latent autoimmune diabetes in adults), managed as type 1 ([place])   . Myocardial infarction [facility]) 2011   anterior MI at [place] s/p BMS to ostial/proximal LAD  . Obstructive sleep apnea 2018  . Seizures ([place])    diabetes seizures (low blood sugar); last one 2016  . Systolic heart failure ([place])   . Vitamin D deficiency     Surgical History: Past Surgical History:  Procedure Laterality Date  . CARDIAC DEFIBRILLATOR PLACEMENT  08/30/2010  . CHOLECYSTECTOMY N/A 11/07/2017   Procedure: LAPAROSCOPIC CHOLECYSTECTOMY;  Surgeon: Herbert Pun, MD;  Location: [facility] ORS;  Service: General;  Laterality: N/A;  . CORONARY ANGIOPLASTY    . ESOPHAGOGASTRODUODENOSCOPY (EGD) WITH PROPOFOL N/A 03/20/2018   Procedure: ESOPHAGOGASTRODUODENOSCOPY (EGD) WITH PROPOFOL;  Surgeon: Manya Silvas, MD;  Location: [facility] ENDOSCOPY;  Service: Endoscopy;  Laterality: N/A;  . LEFT HEART CATH AND CORONARY ANGIOGRAPHY N/A  11/23/2016   Procedure: Left Heart Cath and Coronary Angiography;  Surgeon: Nelva Bush, MD;  Location: [place] CV LAB;  Service: Cardiovascular;  Laterality: N/A;    Home Medications:  Allergies as of 06/05/2018      Reactions   Vancomycin Shortness Of Breath   Lisinopril Rash      Medication List        Accurate as of 06/05/18 11:59 PM. Always use your most recent med list.          aspirin EC 81 MG tablet Take 81 mg by mouth every morning.   atorvastatin 80 MG tablet Commonly known as:  LIPITOR Take 80 mg by mouth at bedtime.   B-D ULTRAFINE III SHORT PEN 31G X 8 MM Misc Generic drug:  Insulin Pen Needle USE 5 TIMES DAILY AS  DIRECTED   carvedilol 25 MG tablet Commonly known as:  COREG Take 25 mg by mouth 2 (two) times daily.   clopidogrel 75 MG tablet Commonly known as:  PLAVIX Take by mouth.   D3-1000 1000 units capsule Generic drug:  Cholecalciferol Take 1,000 Units by mouth daily.   digoxin 0.125 MG tablet Commonly known as:  LANOXIN Take 125 mcg by mouth daily.   diphenoxylate-atropine 2.5-0.025 MG tablet Commonly known as:  LOMOTIL   fluticasone 50 MCG/ACT nasal spray Commonly known as:  FLONASE Place 2 sprays into both nostrils daily as needed for allergies.   FREESTYLE LIBRE 14 DAY READER [place]i Use 1 Device continuously as needed   FREESTYLE LIBRE [place] Use  1 Device every 14 (fourteen) days   furosemide 20 MG tablet Commonly known as:  LASIX Take 1 tablet (20 mg total) by mouth 2 (two) times daily.   GLUCAGEN DIAGNOSTIC 1 MG injection Generic drug:  glucagon (human recombinant) Inject into the muscle.   HUMALOG KWIKPEN 100 UNIT/ML KiwkPen Generic drug:  insulin lispro Inject 18-22 Units into the skin See admin instructions. Inject 18 units subcutaneous in the morning, inject 20 units subcutaneous at lunchtime, and inject 22 units subcutaneous at night. Plus sliding scale as directed.   KETOSTIX strip Generic drug:   acetone (urine) test use to ckeck for ketones in your urine if your blood sugar is over 250   KLOR-CON M10 10 MEQ tablet Generic drug:  potassium chloride TAKE ONE TABLET BY MOUTH EVERY DAY   LANTUS SOLOSTAR 100 UNIT/ML Solostar Pen Generic drug:  Insulin Glargine Inject 16-30 Units into the skin See admin instructions. Inject 30 units SQ in the morning and inject 16 units SQ in the evening   loratadine 10 MG tablet Commonly known as:  CLARITIN Take 10 mg by mouth daily.   losartan 100 MG tablet Commonly known as:  COZAAR Take 100 mg by mouth at bedtime.   metFORMIN 500 MG 24 hr tablet Commonly known as:  GLUCOPHAGE-XR   metoCLOPramide 10 MG tablet Commonly known as:  REGLAN Take 10 mg by mouth 4 (four) times daily.   pantoprazole 40 MG tablet Commonly known as:  PROTONIX Take 1 tablet (40 mg total) by mouth daily.   potassium chloride 10 MEQ CR capsule Commonly known as:  MICRO-K Take 10 mEq by mouth daily.   spironolactone 25 MG tablet Commonly known as:  ALDACTONE Take 12.5 mg by mouth daily.       Allergies:  Allergies  Allergen Reactions  . Vancomycin Shortness Of Breath  . Lisinopril Rash    Family History: Family History  Problem Relation Age of Onset  . Hypertension Mother   . Heart attack Paternal Grandmother   . Heart attack Father   . Bladder Cancer Neg Hx   . Kidney cancer Neg Hx   . Prostate cancer Neg Hx     Social History:  reports that he has never smoked. He has never used smokeless tobacco. He reports that he does not drink alcohol or use drugs.  ROS: UROLOGY Frequent Urination?: No Hard to postpone urination?: No Burning/pain with urination?: No Get up at night to urinate?: No Leakage of urine?: No Urine stream starts and stops?: No Trouble starting stream?: No Do you have to strain to urinate?: No Blood in urine?: No Urinary tract infection?: No Sexually transmitted disease?: No Injury to kidneys or bladder?: No Painful  intercourse?: No Weak stream?: No Erection problems?: Yes Penile pain?: No  Gastrointestinal Nausea?: No Vomiting?: No Indigestion/heartburn?: Yes Diarrhea?: No Constipation?: No  Constitutional Fever: No Night sweats?: No Weight loss?: No Fatigue?: No  Skin Skin rash/lesions?: No Itching?: No  Eyes Blurred vision?: No Double vision?: No  Ears/Nose/Throat Sore throat?: No Sinus problems?: Yes  Hematologic/Lymphatic Swollen glands?: No Easy bruising?: No  Cardiovascular Leg swelling?: No Chest pain?: No  Respiratory Cough?: No Shortness of breath?: No  Endocrine Excessive thirst?: No  Musculoskeletal Back pain?: No Joint pain?: No  Neurological Headaches?: No Dizziness?: No  Psychologic Depression?: No Anxiety?: No  Physical Exam: BP 131/74 (BP Location: Left Arm, Patient Position: Sitting, Cuff Size: Large)   Pulse 84   Ht 6\' 1"  (1.854 m)  Wt 212 lb 3.2 oz (96.3 kg)   BMI 28.00 kg/m    Constitutional:  Alert and oriented, No acute distress. HEENT: [place] AT, moist mucus membranes.  Trachea midline, no masses. Cardiovascular: No clubbing, cyanosis, or edema. Respiratory: Normal respiratory effort, no increased work of breathing. GI: Abdomen is soft, nontender, nondistended, no abdominal masses GU: No CVA tenderness.  Prostate 35 g, smooth without nodules Lymph: No cervical or inguinal lymphadenopathy. Skin: No rashes, bruises or suspicious lesions. Neurologic: Grossly intact, no focal deficits, moving all 4 extremities. Psychiatric: Normal mood and affect.   Assessment & Plan:   47 year old male with an elevated PSA and previous benign prostate biopsy.  His most recent PSA is above baseline.  DRE is benign.  Management options were discussed with Mr. Holycross including repeat standard prostate biopsy, prostate MRI and rechecking his PSA in 4 to 6 weeks.  He does not desire repeat prostate biopsy.  He has elected a repeat PSA in 4 to 6 weeks and if  still elevated above baseline to proceed with a prostate MRI.    Abbie Sons, [place] [address] [place], [place] [address] [phone]

## 2018-06-08 ENCOUNTER — Encounter: Payer: Self-pay | Admitting: Urology

## 2018-07-10 ENCOUNTER — Other Ambulatory Visit: Payer: Managed Care, Other (non HMO)

## 2018-07-10 ENCOUNTER — Encounter: Payer: Self-pay | Admitting: Urology

## 2018-07-10 ENCOUNTER — Other Ambulatory Visit: Payer: Self-pay

## 2018-07-10 DIAGNOSIS — R972 Elevated prostate specific antigen [PSA]: Secondary | ICD-10-CM

## 2018-08-01 ENCOUNTER — Emergency Department
Admission: EM | Admit: 2018-08-01 | Discharge: 2018-08-01 | Disposition: A | Discharge status: Home / Self Care | Payer: Managed Care, Other (non HMO) | Attending: Emergency Medicine | Admitting: Emergency Medicine

## 2018-08-01 ENCOUNTER — Other Ambulatory Visit: Payer: Self-pay

## 2018-08-01 ENCOUNTER — Encounter: Payer: Self-pay | Admitting: Emergency Medicine

## 2018-08-01 DIAGNOSIS — R42 Dizziness and giddiness: Secondary | ICD-10-CM | POA: Diagnosis present

## 2018-08-01 DIAGNOSIS — I2510 Atherosclerotic heart disease of native coronary artery without angina pectoris: Secondary | ICD-10-CM | POA: Insufficient documentation

## 2018-08-01 DIAGNOSIS — E1165 Type 2 diabetes mellitus with hyperglycemia: Secondary | ICD-10-CM | POA: Insufficient documentation

## 2018-08-01 DIAGNOSIS — Z7982 Long term (current) use of aspirin: Secondary | ICD-10-CM | POA: Insufficient documentation

## 2018-08-01 DIAGNOSIS — Z955 Presence of coronary angioplasty implant and graft: Secondary | ICD-10-CM | POA: Insufficient documentation

## 2018-08-01 DIAGNOSIS — I5023 Acute on chronic systolic (congestive) heart failure: Secondary | ICD-10-CM | POA: Insufficient documentation

## 2018-08-01 DIAGNOSIS — E1110 Type 2 diabetes mellitus with ketoacidosis without coma: Secondary | ICD-10-CM | POA: Insufficient documentation

## 2018-08-01 DIAGNOSIS — I110 Hypertensive heart disease with heart failure: Secondary | ICD-10-CM | POA: Insufficient documentation

## 2018-08-01 DIAGNOSIS — R739 Hyperglycemia, unspecified: Secondary | ICD-10-CM

## 2018-08-01 DIAGNOSIS — Z79899 Other long term (current) drug therapy: Secondary | ICD-10-CM | POA: Insufficient documentation

## 2018-08-01 DIAGNOSIS — Z794 Long term (current) use of insulin: Secondary | ICD-10-CM | POA: Insufficient documentation

## 2018-08-01 LAB — CBC
HEMATOCRIT: 43.9 % (ref 39.0–52.0)
HEMOGLOBIN: 14.8 g/dL (ref 13.0–17.0)
MCH: 29.7 pg (ref 26.0–34.0)
MCHC: 33.7 g/dL (ref 30.0–36.0)
MCV: 88.2 fL (ref 80.0–100.0)
NRBC: 0 % (ref 0.0–0.2)
PLATELETS: 163 10*3/uL (ref 150–400)
RBC: 4.98 MIL/uL (ref 4.22–5.81)
RDW: 12.5 % (ref 11.5–15.5)
WBC: 9.3 10*3/uL (ref 4.0–10.5)

## 2018-08-01 LAB — URINALYSIS, COMPLETE (UACMP) WITH MICROSCOPIC
BACTERIA UA: NONE SEEN
Bilirubin Urine: NEGATIVE
Glucose, UA: >=500 mg/dL — AB
Hgb urine dipstick: NEGATIVE
KETONES UR: NEGATIVE mg/dL
LEUKOCYTES UA: NEGATIVE
Nitrite: NEGATIVE
PH: 6 (ref 5.0–8.0)
Protein, ur: 30 mg/dL — AB
SPECIFIC GRAVITY, URINE: 1.025 (ref 1.005–1.030)

## 2018-08-01 LAB — BASIC METABOLIC PANEL
ANION GAP: 8 (ref 5–15)
BUN: 15 mg/dL (ref 6–20)
CALCIUM: 8.7 mg/dL — AB (ref 8.9–10.3)
CHLORIDE: 107 mmol/L (ref 98–111)
CO2: 22 mmol/L (ref 22–32)
Creatinine, Ser: 0.89 mg/dL (ref 0.61–1.24)
GFR calc non Af Amer: >60 mL/min (ref >60)
GLUCOSE: 454 mg/dL — AB (ref 70–99)
Potassium: 4.3 mmol/L (ref 3.5–5.1)
Sodium: 137 mmol/L (ref 135–145)

## 2018-08-01 LAB — GLUCOSE, CAPILLARY
GLUCOSE-CAPILLARY: 477 mg/dL — AB (ref 70–99)
GLUCOSE-CAPILLARY: 395 mg/dL — AB (ref 70–99)
GLUCOSE-CAPILLARY: 358 mg/dL — AB (ref 70–99)

## 2018-08-01 LAB — TROPONIN I: Troponin I: <0.03 ng/mL (ref <0.03)

## 2018-08-01 MED ORDER — INSULIN ASPART 100 UNIT/ML ~~LOC~~ SOLN
8.0000 [IU] | Freq: Once | SUBCUTANEOUS | Status: AC
  Administered 2018-08-01: 8 [IU] via INTRAVENOUS
  Filled 2018-08-01: qty 1

## 2018-08-01 MED ORDER — SODIUM CHLORIDE 0.9 % IV BOLUS
1000.0000 mL | Freq: Once | INTRAVENOUS | Status: AC
  Administered 2018-08-01: 1000 mL via INTRAVENOUS

## 2018-08-01 NOTE — ED Triage Notes (Signed)
Pt in via POV with complaints of feeling fatigued and dizzy/light headed while at work this morning.  Pt reports going home, resting, getting up and going [facility] to be checked out, sent here due to abnormal EKG.  Pt ambulatory to triage, NAD noted upon arrival.

## 2018-08-01 NOTE — Discharge Instructions (Signed)
As we discussed please drink plenty of non-sugary fluids over the next 2 to 3 days obtain plenty of rest as needed.  Please follow-up with your doctor in the next 2 to 3 days for recheck/reevaluation.  Return to the emergency department for any symptoms personally concerning to yourself.

## 2018-08-01 NOTE — ED Provider Notes (Signed)
Vitals:   08/01/18 1637 08/01/18 2000  BP: (!) 151/75 (!) 161/88  Pulse: 88 76  Resp: 16   Temp: 98.3 F (36.8 C)   SpO2: 96% 98%    Patient resting comfortably.  Blood sugars improved now to 358.  No elevated anion gap, the patient reports he is compliant and will continue his insulin therapy.  Resting comfortably with no complaints at this time.  Return precautions and treatment recommendations and follow-up discussed with the patient who is agreeable with the plan.     Delman Kitten, MD 08/01/18 2321

## 2018-08-01 NOTE — ED Provider Notes (Signed)
[facility] Emergency Department Provider Note  Time seen: 8:37 PM  I have reviewed the triage vital signs and the nursing notes.   HISTORY  Chief Complaint Fatigue and Dizziness    HPI Joseph [name] is a 47 y.o. male with a past medical history of CHF, CAD, diabetes, gastric reflux, hypertension, hyperlipidemia, seizure disorder, presents to the emergency department for an abnormal EKG feeling lightheaded/dizzy.  According to the patient he was at work earlier today and he was feeling somewhat lightheaded and dizzy.  States he sat down and took a break for a while was able to eat something and felt quite a bit better.  Began working once again however shortly after that began feeling lightheaded and dizzy once again.  Patient went home try to rest and relax, went to urgent care to get evaluated.  They checked an EKG and told him there was abnormal and asked him to come to the emergency department for evaluation.  Here the patient states he is feeling much better no longer feels lightheaded or dizzy.  Denies any chest pain at any point today.  Denies any shortness of breath.  States he felt like he was going to pass out earlier today but that has since passed.  States his blood glucose at home is been between 150 and 200.  Does admit he ate just before coming to the emergency department and has not redosed insulin.   Past Medical History:  Diagnosis Date  . AICD (automatic cardioverter/defibrillator) present   . CHF (congestive heart failure) ([place])   . Coronary artery disease   . Diabetes mellitus without complication ([place])   . ED (erectile dysfunction)   . GERD (gastroesophageal reflux disease)   . Hyperlipidemia   . Hypertension   . Ischemic cardiomyopathy   . LADA (latent autoimmune diabetes in adults), managed as type 1 ([place])   . Myocardial infarction [facility]) 2011   anterior MI at [place] s/p BMS to ostial/proximal LAD  . Obstructive sleep apnea 2018  . Seizures  ([place])    diabetes seizures (low blood sugar); last one 2016  . Systolic heart failure ([place])   . Vitamin D deficiency     Patient Active Problem List   Diagnosis Date Noted  . 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 Elevated PSA 07/25/2017  . Erectile dysfunction associated with type 2 diabetes mellitus ([place]) 07/25/2017  . Acute on chronic systolic heart failure ([place]) 11/24/2016  . Shortness of breath   . Abnormal EKG   . DKA (diabetic ketoacidoses) ([place]) 04/18/2016  . CAP (community acquired pneumonia) 04/18/2016  . Accelerated hypertension 04/18/2016  . Abdominal pain 04/18/2016    Past Surgical History:  Procedure Laterality Date  . CARDIAC DEFIBRILLATOR PLACEMENT  08/30/2010  . CHOLECYSTECTOMY N/A 11/07/2017   Procedure: LAPAROSCOPIC CHOLECYSTECTOMY;  Surgeon: Herbert Pun, MD;  Location: [facility] ORS;  Service: General;  Laterality: N/A;  . CORONARY ANGIOPLASTY    . ESOPHAGOGASTRODUODENOSCOPY (EGD) WITH PROPOFOL N/A 03/20/2018   Procedure: ESOPHAGOGASTRODUODENOSCOPY (EGD) WITH PROPOFOL;  Surgeon: Manya Silvas, MD;  Location: [facility] ENDOSCOPY;  Service: Endoscopy;  Laterality: N/A;  . LEFT HEART CATH AND CORONARY ANGIOGRAPHY N/A 11/23/2016   Procedure: Left Heart Cath and Coronary Angiography;  Surgeon: Nelva Bush, MD;  Location: [place] CV LAB;  Service: Cardiovascular;  Laterality: N/A;    Prior to Admission medications   Medication Sig Start Date End Date Taking? Authorizing Provider  acetone, urine, test (KETOSTIX) strip use to ckeck for ketones  in your urine if your blood sugar is over 250 11/18/10   [provider]  aspirin EC 81 MG tablet Take 81 mg by mouth every morning.    [provider]  atorvastatin (LIPITOR) 80 MG tablet Take 80 mg by mouth at bedtime.  02/09/16   [provider]  carvedilol (COREG) 25 MG tablet Take 25 mg by mouth 2 (two) times daily.  02/16/16   [provider]  Cholecalciferol (D3-1000) 1000 units  capsule Take 1,000 Units by mouth daily.    [provider]  clopidogrel (PLAVIX) 75 MG tablet Take by mouth. 11/26/16   [provider]  Continuous Blood Gluc Receiver (FREESTYLE LIBRE 14 DAY READER) DEVI Use 1 Device continuously as needed 02/07/18   [provider]  Continuous Blood Gluc Sensor (FREESTYLE LIBRE 14 DAY SENSOR) MISC Use 1 Device every 14 (fourteen) days 02/07/18   [provider]  digoxin (LANOXIN) 0.125 MG tablet Take 125 mcg by mouth daily. 02/16/16   [provider]  diphenoxylate-atropine (LOMOTIL) 2.5-0.025 MG tablet  06/01/18   [provider]  fluticasone (FLONASE) 50 MCG/ACT nasal spray Place 2 sprays into both nostrils daily as needed for allergies.  01/26/16   [provider]  furosemide (LASIX) 20 MG tablet Take 1 tablet (20 mg total) by mouth 2 (two) times daily. 11/25/16   Dustin Flock, MD  glucagon, human recombinant, (GLUCAGEN DIAGNOSTIC) 1 MG injection Inject into the muscle. 02/07/18   [provider]  HUMALOG KWIKPEN 100 UNIT/ML KiwkPen Inject 18-22 Units into the skin See admin instructions. Inject 18 units subcutaneous in the morning, inject 20 units subcutaneous at lunchtime, and inject 22 units subcutaneous at night. Plus sliding scale as directed. 03/09/16   [provider]  Insulin Pen Needle (B-D ULTRAFINE III SHORT PEN) 31G X 8 MM MISC USE 5 TIMES DAILY AS  DIRECTED 05/19/17   [provider]  LANTUS SOLOSTAR 100 UNIT/ML Solostar Pen Inject 16-30 Units into the skin See admin instructions. Inject 30 units SQ in the morning and inject 16 units SQ in the evening 04/27/17   [provider]  loratadine (CLARITIN) 10 MG tablet Take 10 mg by mouth daily.    [provider]  losartan (COZAAR) 100 MG tablet Take 100 mg by mouth at bedtime.    [provider]  metFORMIN (GLUCOPHAGE-XR) 500 MG 24 hr tablet  05/16/18   [provider]  metoCLOPramide  (REGLAN) 10 MG tablet Take 10 mg by mouth 4 (four) times daily.    [provider]  pantoprazole (PROTONIX) 40 MG tablet Take 1 tablet (40 mg total) by mouth daily. 04/21/16   [name], MD  potassium chloride (KLOR-CON M10) 10 MEQ tablet TAKE ONE TABLET BY MOUTH EVERY DAY 07/28/12   [provider]  potassium chloride (MICRO-K) 10 MEQ CR capsule Take 10 mEq by mouth daily. 02/23/16   [provider]  spironolactone (ALDACTONE) 25 MG tablet Take 12.5 mg by mouth daily. 03/01/17   [provider]    Allergies  Allergen Reactions  . Vancomycin Shortness Of Breath  . Lisinopril Rash    Family History  Problem Relation Age of Onset  . Hypertension Mother   . Heart attack Paternal Grandmother   . Heart attack Father   . Bladder Cancer Neg Hx   . Kidney cancer Neg Hx   . Prostate cancer Neg Hx     Social History Social History   Tobacco Use  .  Smoking status: Never Smoker  . Smokeless tobacco: Never Used  Substance Use Topics  . Alcohol use: No  . Drug use: No    Review of Systems Constitutional: Negative for fever. ENT: Negative for recent illness/congestion Cardiovascular: Negative for chest pain. Respiratory: Negative for shortness of breath. Gastrointestinal: Negative for abdominal pain, vomiting Genitourinary: Negative for urinary compaints Musculoskeletal: Negative for musculoskeletal complaints Skin: Negative for skin complaints  Neurological: Negative for headache.  Positive for lightheadedness/dizziness which has since resolved. All other ROS negative  ____________________________________________   PHYSICAL EXAM:  VITAL SIGNS: ED Triage Vitals  Enc Vitals Group     BP 08/01/18 1637 (!) 151/75     Pulse Rate 08/01/18 1637 88     Resp 08/01/18 1637 16     Temp 08/01/18 1637 98.3 F (36.8 C)     Temp Source 08/01/18 1637 Oral     SpO2 08/01/18 1637 96 %     Weight 08/01/18 1633 214 lb (97.1 kg)     Height  08/01/18 1633 6\' 1"  (1.854 m)     Head Circumference --      Peak Flow --      Pain Score 08/01/18 1633 0     Pain Loc --      Pain Edu? --      Excl. in [place]? --    Constitutional: Alert and oriented. Well appearing and in no distress. Eyes: Normal exam ENT   Head: Normocephalic and atraumatic.   Mouth/Throat: Mucous membranes are moist. Cardiovascular: Normal rate, regular rhythm. No murmur Respiratory: Normal respiratory effort without tachypnea nor retractions. Breath sounds are clear  Gastrointestinal: Soft and nontender. No distention.  Musculoskeletal: Nontender with normal range of motion in all extremities.  Neurologic:  Normal speech and language. No gross focal neurologic deficits Skin:  Skin is warm, dry and intact.  Psychiatric: Mood and affect are normal.   ____________________________________________    EKG  EKG reviewed and interpreted by myself shows a normal sinus rhythm 89 bpm with slight widening of the QRS, normal axis, largely normal intervals with nonspecific ST changes.  In comparing to the patient's last EKG 10/28/2017 QRS is identical, no acute changes.  ____________________________________________  INITIAL IMPRESSION / ASSESSMENT AND PLAN / ED COURSE  Pertinent labs & imaging results that were available during my care of the patient were reviewed by me and considered in my medical decision making (see chart for details).  Patient presents to the emergency department for lightheadedness/dizziness at work earlier today.  Symptoms have since resolved.  He went to urgent care for evaluation was found to have an abnormal EKG and was referred to the emergency department.  Here the patient was noted to have hyperglycemia greater than 400.  Patient symptoms could very likely be due to hyperglycemia, dehydration or hypervolemia, letter light or metabolic abnormality.  Patient is EKG is somewhat abnormal with a widened QRS however compared to his prior EKG in  January of this year it is largely unchanged.  I added a troponin onto the patient's lab work has resulted negative as well.  Patient's blood work does show significant elevation in blood glucose 454.  We will start IV fluids, dose insulin and continue to closely monitor.  The remainder the patient's work-up is nonrevealing.  I anticipate likely discharge home once his blood glucose has decreased.  Patient agreeable to plan of care.  Patient care signed out to Dr. Jacqualine Code.   ____________________________________________   FINAL CLINICAL IMPRESSION(S) / ED  DIAGNOSES  Hyperglycemia Dizziness    Harvest Dark, MD 08/01/18 2050

## 2018-08-10 ENCOUNTER — Telehealth: Payer: Self-pay | Admitting: Family Medicine

## 2018-08-10 NOTE — Telephone Encounter (Signed)
Unable to leave message, Mailbox is full. 

## 2018-08-10 NOTE — Telephone Encounter (Signed)
-----   Message from Abbie Sons, MD sent at 08/09/2018  9:24 AM EDT ----- Patient was due for a repeat PSA last month

## 2018-08-14 NOTE — Telephone Encounter (Signed)
Appointment scheduled.

## 2018-08-16 ENCOUNTER — Other Ambulatory Visit: Payer: Managed Care, Other (non HMO)

## 2018-08-16 DIAGNOSIS — R972 Elevated prostate specific antigen [PSA]: Secondary | ICD-10-CM

## 2018-08-17 ENCOUNTER — Telehealth: Payer: Self-pay | Admitting: Family Medicine

## 2018-08-17 ENCOUNTER — Other Ambulatory Visit: Payer: Self-pay | Admitting: Urology

## 2018-08-17 DIAGNOSIS — R972 Elevated prostate specific antigen [PSA]: Secondary | ICD-10-CM

## 2018-08-17 LAB — PSA: Prostate Specific Ag, Serum: 7.5 ng/mL — ABNORMAL HIGH (ref 0.0–4.0)

## 2018-08-17 NOTE — Telephone Encounter (Signed)
Unable to leave message. Mailbox was full

## 2018-08-17 NOTE — Telephone Encounter (Signed)
-----   Message from Abbie Sons, MD sent at 08/17/2018  7:10 AM EST ----- Repeat PSA remains elevated above baseline at 7.5.  Recommend scheduling prostate MRI.  Will call with results.

## 2018-08-21 NOTE — Telephone Encounter (Signed)
Spoke with patient and he was agreeable with the plan   Sharyn Lull

## 2018-09-01 ENCOUNTER — Telehealth: Payer: Self-pay | Admitting: Urology

## 2018-09-01 NOTE — Telephone Encounter (Signed)
I received a call from [facility] and they wanted to let you know that they are unable to do his Prostate MRI there due to the patient having a pace maker. They said he would need to go to a hospital facility. Does he need to go to [facility]?  Please advise

## 2018-09-03 NOTE — Telephone Encounter (Signed)
I would recommend a stronger magnet MRI which would be [facility].  Please check with MRI department to see what their criteria is for prostate MRI with a pacemaker.  He may not be able to have an MRI.

## 2018-09-04 NOTE — Telephone Encounter (Deleted)
They actually said he could not have one at all. But I will call [facility] and check with them.   Sharyn Lull

## 2018-09-06 NOTE — Telephone Encounter (Signed)
So I spoke with [facility] and they said that he could have his MRI at [facility] so I will fill the form out and you can sign it when you are back in the office.   Thanks, Sharyn Lull

## 2018-10-08 ENCOUNTER — Emergency Department
Admission: EM | Admit: 2018-10-08 | Discharge: 2018-10-08 | Disposition: A | Discharge status: Home / Self Care | Payer: Managed Care, Other (non HMO) | Attending: Emergency Medicine | Admitting: Emergency Medicine

## 2018-10-08 ENCOUNTER — Encounter: Payer: Self-pay | Admitting: Emergency Medicine

## 2018-10-08 ENCOUNTER — Emergency Department: Payer: Managed Care, Other (non HMO)

## 2018-10-08 DIAGNOSIS — E1165 Type 2 diabetes mellitus with hyperglycemia: Secondary | ICD-10-CM | POA: Insufficient documentation

## 2018-10-08 DIAGNOSIS — I509 Heart failure, unspecified: Secondary | ICD-10-CM | POA: Diagnosis not present

## 2018-10-08 DIAGNOSIS — J209 Acute bronchitis, unspecified: Secondary | ICD-10-CM | POA: Diagnosis not present

## 2018-10-08 DIAGNOSIS — R69 Illness, unspecified: Secondary | ICD-10-CM

## 2018-10-08 DIAGNOSIS — R05 Cough: Secondary | ICD-10-CM | POA: Diagnosis present

## 2018-10-08 DIAGNOSIS — I110 Hypertensive heart disease with heart failure: Secondary | ICD-10-CM | POA: Insufficient documentation

## 2018-10-08 DIAGNOSIS — J111 Influenza due to unidentified influenza virus with other respiratory manifestations: Secondary | ICD-10-CM

## 2018-10-08 DIAGNOSIS — I2510 Atherosclerotic heart disease of native coronary artery without angina pectoris: Secondary | ICD-10-CM | POA: Diagnosis not present

## 2018-10-08 DIAGNOSIS — Z794 Long term (current) use of insulin: Secondary | ICD-10-CM | POA: Diagnosis not present

## 2018-10-08 LAB — CBC
HEMATOCRIT: 42.5 % (ref 39.0–52.0)
Hemoglobin: 14.5 g/dL (ref 13.0–17.0)
MCH: 30 pg (ref 26.0–34.0)
MCHC: 34.1 g/dL (ref 30.0–36.0)
MCV: 87.8 fL (ref 80.0–100.0)
Platelets: 162 10*3/uL (ref 150–400)
RBC: 4.84 MIL/uL (ref 4.22–5.81)
RDW: 12.7 % (ref 11.5–15.5)
WBC: 6.2 10*3/uL (ref 4.0–10.5)
nRBC: 0 % (ref 0.0–0.2)

## 2018-10-08 LAB — BASIC METABOLIC PANEL
Anion gap: 8 (ref 5–15)
BUN: 15 mg/dL (ref 6–20)
CHLORIDE: 105 mmol/L (ref 98–111)
CO2: 22 mmol/L (ref 22–32)
CREATININE: 0.79 mg/dL (ref 0.61–1.24)
Calcium: 8.5 mg/dL — ABNORMAL LOW (ref 8.9–10.3)
GFR calc Af Amer: >60 mL/min (ref >60)
GFR calc non Af Amer: >60 mL/min (ref >60)
Glucose, Bld: 349 mg/dL — ABNORMAL HIGH (ref 70–99)
Potassium: 4.1 mmol/L (ref 3.5–5.1)
Sodium: 135 mmol/L (ref 135–145)

## 2018-10-08 LAB — TROPONIN I: Troponin I: <0.03 ng/mL (ref <0.03)

## 2018-10-08 LAB — DIGOXIN LEVEL: Digoxin Level: 0.7 ng/mL — ABNORMAL LOW (ref 0.8–2.0)

## 2018-10-08 MED ORDER — ALBUTEROL SULFATE (2.5 MG/3ML) 0.083% IN NEBU
5.0000 mg | INHALATION_SOLUTION | Freq: Once | RESPIRATORY_TRACT | Status: AC
  Administered 2018-10-08: 5 mg via RESPIRATORY_TRACT
  Filled 2018-10-08: qty 6

## 2018-10-08 MED ORDER — ALBUTEROL SULFATE HFA 108 (90 BASE) MCG/ACT IN AERS: 2.0000 | INHALATION_SPRAY | RESPIRATORY_TRACT | 0 refills | Status: AC | PRN

## 2018-10-08 MED ORDER — SODIUM CHLORIDE 0.9 % IV BOLUS
500.0000 mL | Freq: Once | INTRAVENOUS | Status: AC
  Administered 2018-10-08: 500 mL via INTRAVENOUS

## 2018-10-08 MED ORDER — IPRATROPIUM-ALBUTEROL 0.5-2.5 (3) MG/3ML IN SOLN
3.0000 mL | Freq: Once | RESPIRATORY_TRACT | Status: AC
  Administered 2018-10-08: 3 mL via RESPIRATORY_TRACT
  Filled 2018-10-08: qty 3

## 2018-10-08 NOTE — ED Triage Notes (Signed)
Patient presents to the ED with cough and congestion since Christmas worse the past 2 days.  Patient reports cough has caused chest soreness and reports feeling short of breath.  Patient ambulatory to triage without obvious distress.  Patient is tachypnic in triage.  Expiratory wheezes heard on both sides.

## 2018-10-08 NOTE — Discharge Instructions (Addendum)
Use albuterol and anti-inflammatory medicine to help with your breathing symptoms.  Your symptoms may be due to influenza, but at this point treatment with antiviral medicine would not be beneficial.  Follow-up with your doctor this week to continue monitoring your symptoms, and continue monitoring your blood sugar closely and using insulin as needed.

## 2018-10-08 NOTE — ED Provider Notes (Signed)
[facility] Emergency Department Provider Note  ____________________________________________  Time seen: Approximately 3:32 PM  I have reviewed the triage vital signs and the nursing notes.   HISTORY  Chief Complaint Cough and Nasal Congestion    HPI Joseph [name] is a 47 y.o. male with a history of CHF status post AICD implantation, CAD diabetes hypertension who comes the ED complaining of congestion nonproductive cough and mild shortness of breath for the past 5 days.  With coughing he has some chest soreness but no pleuritic chest pain.  Denies fever or vomiting, eating and drinking normally.  Blood sugars have been running in the 200s.  He takes Humalog as needed.  No aggravating or alleviating factors.      Past Medical History:  Diagnosis Date  . AICD (automatic cardioverter/defibrillator) present   . CHF (congestive heart failure) ([place])   . Coronary artery disease   . Diabetes mellitus without complication ([place])   . ED (erectile dysfunction)   . GERD (gastroesophageal reflux disease)   . Hyperlipidemia   . Hypertension   . Ischemic cardiomyopathy   . LADA (latent autoimmune diabetes in adults), managed as type 1 ([place])   . Myocardial infarction [facility]) 2011   anterior MI at [place] s/p BMS to ostial/proximal LAD  . Obstructive sleep apnea 2018  . Seizures ([place])    diabetes seizures (low blood sugar); last one 2016  . Systolic heart failure ([place])   . Vitamin D deficiency      Patient Active Problem List   Diagnosis Date Noted  . 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 Elevated PSA 07/25/2017  . Erectile dysfunction associated with type 2 diabetes mellitus ([place]) 07/25/2017  . Acute on chronic systolic heart failure ([place]) 11/24/2016  . Shortness of breath   . Abnormal EKG   . DKA (diabetic ketoacidoses) ([place]) 04/18/2016  . CAP (community acquired pneumonia) 04/18/2016  . Accelerated hypertension 04/18/2016  . Abdominal pain 04/18/2016      Past Surgical History:  Procedure Laterality Date  . CARDIAC DEFIBRILLATOR PLACEMENT  08/30/2010  . CHOLECYSTECTOMY N/A 11/07/2017   Procedure: LAPAROSCOPIC CHOLECYSTECTOMY;  Surgeon: Herbert Pun, MD;  Location: [facility] ORS;  Service: General;  Laterality: N/A;  . CORONARY ANGIOPLASTY    . ESOPHAGOGASTRODUODENOSCOPY (EGD) WITH PROPOFOL N/A 03/20/2018   Procedure: ESOPHAGOGASTRODUODENOSCOPY (EGD) WITH PROPOFOL;  Surgeon: Manya Silvas, MD;  Location: [facility] ENDOSCOPY;  Service: Endoscopy;  Laterality: N/A;  . LEFT HEART CATH AND CORONARY ANGIOGRAPHY N/A 11/23/2016   Procedure: Left Heart Cath and Coronary Angiography;  Surgeon: Nelva Bush, MD;  Location: [facility] CV LAB;  Service: Cardiovascular;  Laterality: N/A;     Prior to Admission medications   Medication Sig Start Date End Date Taking? Authorizing Provider  albuterol (PROVENTIL HFA) 108 (90 Base) MCG/ACT inhaler Inhale 2 puffs into the lungs every 4 (four) hours as needed for wheezing or shortness of breath. 10/08/18   Carrie Mew, MD  aspirin EC 81 MG tablet Take 81 mg by mouth every morning.    [provider]  atorvastatin (LIPITOR) 80 MG tablet Take 80 mg by mouth at bedtime.  02/09/16   [provider]  carvedilol (COREG) 25 MG tablet Take 25 mg by mouth 2 (two) times daily.  02/16/16   [provider]  Cholecalciferol (D3-1000) 1000 units capsule Take 1,000 Units by mouth daily.    [provider]  clopidogrel (PLAVIX) 75 MG tablet Take by mouth. 11/26/16   [provider]  Continuous Blood Gluc Receiver (FREESTYLE LIBRE 14 DAY READER) DEVI Use 1 Device continuously as needed 02/07/18   [provider]  Continuous Blood Gluc Sensor (FREESTYLE LIBRE 14 DAY SENSOR) MISC Use 1 Device every 14 (fourteen) days 02/07/18   [provider]  digoxin (LANOXIN) 0.125 MG tablet Take 125 mcg by mouth daily. 02/16/16   [provider]   diphenoxylate-atropine (LOMOTIL) 2.5-0.025 MG tablet  06/01/18   [provider]  fluticasone (FLONASE) 50 MCG/ACT nasal spray Place 2 sprays into both nostrils daily as needed for allergies.  01/26/16   [provider]  furosemide (LASIX) 20 MG tablet Take 1 tablet (20 mg total) by mouth 2 (two) times daily. 11/25/16   Dustin Flock, MD  glucagon, human recombinant, (GLUCAGEN DIAGNOSTIC) 1 MG injection Inject into the muscle. 02/07/18   [provider]  HUMALOG KWIKPEN 100 UNIT/ML KiwkPen Inject 18-22 Units into the skin See admin instructions. Inject 18 units subcutaneous in the morning, inject 20 units subcutaneous at lunchtime, and inject 22 units subcutaneous at night. Plus sliding scale as directed. 03/09/16   [provider]  Insulin Pen Needle (B-D ULTRAFINE III SHORT PEN) 31G X 8 MM MISC USE 5 TIMES DAILY AS  DIRECTED 05/19/17   [provider]  LANTUS SOLOSTAR 100 UNIT/ML Solostar Pen Inject 16-30 Units into the skin See admin instructions. Inject 30 units SQ in the morning and inject 16 units SQ in the evening 04/27/17   [provider]  loratadine (CLARITIN) 10 MG tablet Take 10 mg by mouth daily.    [provider]  losartan (COZAAR) 100 MG tablet Take 100 mg by mouth at bedtime.    [provider]  metFORMIN (GLUCOPHAGE-XR) 500 MG 24 hr tablet  05/16/18   [provider]  metoCLOPramide (REGLAN) 10 MG tablet Take 10 mg by mouth 4 (four) times daily.    [provider]  pantoprazole (PROTONIX) 40 MG tablet Take 1 tablet (40 mg total) by mouth daily. 04/21/16   [name], MD  potassium chloride (KLOR-CON M10) 10 MEQ tablet TAKE ONE TABLET BY MOUTH EVERY DAY 07/28/12   [provider]  potassium chloride (MICRO-K) 10 MEQ CR capsule Take 10 mEq by mouth daily. 02/23/16   [provider]  spironolactone (ALDACTONE) 25 MG tablet Take 12.5 mg by mouth daily. 03/01/17   [provider]     Allergies Vancomycin and Lisinopril   Family History  Problem Relation Age of Onset  . Hypertension Mother   . Heart attack Paternal Grandmother   . Heart attack Father   . Bladder Cancer Neg Hx   . Kidney cancer Neg Hx   . Prostate cancer Neg Hx     Social History Social History   Tobacco Use  . Smoking status: Never Smoker  . Smokeless tobacco: Never Used  Substance Use Topics  . Alcohol use: No  . Drug use: No    Review of Systems  Constitutional:   No fever or chills.  ENT:   Positive rhinorrhea  Cardiovascular:   No chest pain or syncope. Respiratory: Positive shortness of breath and nonproductive cough. Gastrointestinal:   Negative for abdominal pain, vomiting and diarrhea.  Musculoskeletal:   Negative for focal pain or swelling All other systems reviewed and are negative except as documented above in ROS and HPI.  ____________________________________________   PHYSICAL EXAM:  VITAL SIGNS: ED Triage Vitals  Enc Vitals Group     BP 10/08/18 1220 132/76  Pulse Rate 10/08/18 1220 83     Resp 10/08/18 1220 (!) 28     Temp 10/08/18 1220 98.6 F (37 C)     Temp Source 10/08/18 1220 Oral     SpO2 10/08/18 1220 97 %     Weight 10/08/18 1222 214 lb (97.1 kg)     Height 10/08/18 1222 6\' 1"  (1.854 m)     Head Circumference --      Peak Flow --      Pain Score 10/08/18 1221 7     Pain Loc --      Pain Edu? --      Excl. in [place]? --     Vital signs reviewed, nursing assessments reviewed.   Constitutional:   Alert and oriented. Non-toxic appearance. Eyes:   Conjunctivae are normal. EOMI. PERRL. ENT      Head:   Normocephalic and atraumatic.      Nose:   No congestion/rhinnorhea.       Mouth/Throat:   MMM, no pharyngeal erythema. No peritonsillar mass.       Neck:   No meningismus. Full ROM. Hematological/Lymphatic/Immunilogical:   No cervical lymphadenopathy. Cardiovascular:   RRR. Symmetric bilateral radial and DP pulses.  No  murmurs. Cap refill less than 2 seconds. Respiratory:   Normal respiratory effort without tachypnea/retractions. Breath sounds are clear and equal bilaterally. No wheezes/rales/rhonchi. Gastrointestinal:   Soft and nontender. Non distended. There is no CVA tenderness.  No rebound, rigidity, or guarding. Musculoskeletal:   Normal range of motion in all extremities. No joint effusions.  No lower extremity tenderness.  No edema. Neurologic:   Normal speech and language.  Motor grossly intact. No acute focal neurologic deficits are appreciated.  Skin:    Skin is warm, dry and intact. No rash noted.  No petechiae, purpura, or bullae.  ____________________________________________    LABS (pertinent positives/negatives) (all labs ordered are listed, but only abnormal results are displayed) Labs Reviewed  BASIC METABOLIC PANEL - Abnormal; Notable for the following components:      Result Value   Glucose, Bld 349 (*)    Calcium 8.5 (*)    All other components within normal limits  DIGOXIN LEVEL - Abnormal; Notable for the following components:   Digoxin Level 0.7 (*)    All other components within normal limits  CBC  TROPONIN I   ____________________________________________   EKG  Interpreted by me Sinus rhythm rate of 86, right axis, right bundle branch block.  Several areas of J-point elevation related to the bundle branch block.  No significant ST elevation or ischemic changes.  ____________________________________________    RADIOLOGY  Dg Chest 2 View  Result Date: 10/08/2018 CLINICAL DATA:  Patient presents to the ED with cough and congestion since Christmas worse the past 2 days. Patient reports cough has caused chest soreness and reports feeling short of breath. Tachypneic. Expiratory wheezes. History of systolic heart failure. EXAM: CHEST - 2 VIEW COMPARISON:  None. FINDINGS: Left sided transvenous pacemaker lead overlies the RIGHT ventricle. Heart size is normal. Lungs are  clear. No pulmonary edema. IMPRESSION: No active cardiopulmonary disease. Electronically Signed   By: Nolon Nations M.D.   On: 10/08/2018 14:10    ____________________________________________   PROCEDURES Procedures  ____________________________________________  DIFFERENTIAL DIAGNOSIS   Viral respiratory infection, pulmonary edema, pneumothorax, pneumonia, bronchitis  CLINICAL IMPRESSION / ASSESSMENT AND PLAN / ED COURSE  Pertinent labs & imaging results that were available during my care of the patient were reviewed by me  and considered in my medical decision making (see chart for details).    Patient presents with cough congestion and respiratory symptoms.  Noncardiac.  Labs including troponin are negative.  EKG is nondiagnostic, vital signs unremarkable.  Is not in respiratory distress.  He was initially wheezy but feels much better after receiving albuterol from triage and his exam is very reassuring.  Chest x-ray negative for pneumothorax pulmonary edema pleural effusion or pneumonia.  I will discharge him with albuterol inhaler, continue monitoring his blood sugars and follow-up with his doctor.  Doubt ACS PE carditis or dissection.      ____________________________________________   FINAL CLINICAL IMPRESSION(S) / ED DIAGNOSES    Final diagnoses:  Acute bronchitis, unspecified organism  Type 2 diabetes mellitus with hyperglycemia, with long-term current use of insulin ([place])  Influenza-like illness     ED Discharge Orders         Ordered    albuterol (PROVENTIL HFA) 108 (90 Base) MCG/ACT inhaler  Every 4 hours PRN     10/08/18 1532          Portions of this note were generated with dragon dictation software. Dictation errors may occur despite best attempts at proofreading.   Carrie Mew, MD 10/08/18 1536

## 2018-10-08 NOTE — ED Notes (Signed)
Chuck from lab informed this RN that Digoxin lab could be ran off blood that was sent earlier for pt.

## 2018-10-08 NOTE — ED Notes (Signed)
Patient transported to X-ray 

## 2018-10-20 ENCOUNTER — Telehealth: Payer: Self-pay | Admitting: Urology

## 2018-10-20 NOTE — Telephone Encounter (Signed)
Called patient to check and see if he ever got scheduled for his MRI at [facility] and he said he had not been called. I transferred him to [facility] scheduling to get that scheduled.   Sharyn Lull

## 2018-10-24 NOTE — Telephone Encounter (Signed)
As far as I can tell he has his MRI scheduled for 11-06-18 at [facility] Can you see this in care everywhere? I will keep a check on it and check to see if he has it done then. Are you going to call with results?   Sharyn Lull

## 2018-10-25 NOTE — Telephone Encounter (Signed)
There is a radiology appointment scheduled on 1/27 in care everywhere

## 2018-10-25 NOTE — Telephone Encounter (Signed)
Ok thank you, I am still figuring care everywhere out, just making sure I was looking at it right

## 2018-11-27 ENCOUNTER — Telehealth: Payer: Self-pay | Admitting: Urology

## 2018-11-27 NOTE — Telephone Encounter (Signed)
Patient left message today that he was unable to have his MRI done @ [facility], he said they were not able to perform it due to their equipment not being strong enough?  What would you like to do?   Sharyn Lull

## 2018-11-28 NOTE — Telephone Encounter (Signed)
That does not make sense since [facility] has a 3 Tesla magnet MRI.  Can you check with [facility] to verify?  Thanks.

## 2018-11-29 NOTE — Telephone Encounter (Signed)
I called [facility] and they said that it was canceled because they are trying to make sure his device is compatible with the 3T MRI scanner. Certain devices can not be scanned and they are checking to see if his is one of them. If they can scan him they will get him rescheduled. I have called the patient to discuss with him but his voicemail is full so I could not leave a message.   Sharyn Lull

## 2019-04-09 ENCOUNTER — Telehealth: Payer: Self-pay | Admitting: Urology

## 2019-04-09 NOTE — Telephone Encounter (Signed)
appts made patient is aware

## 2019-04-09 NOTE — Telephone Encounter (Signed)
-----   Message from Abbie Sons, MD sent at 04/09/2019  7:56 AM EDT ----- Regarding: RE: MRI Please schedule follow-up appointment with PSA to discuss other options-next routine is fine ----- Message ----- From: Benard Halsted Sent: 03/15/2019   4:11 PM EDT To: Abbie Sons, MD Subject: RE: MRI                                        I spoke with the head Radiologist @ [facility] and he looked up his pacemaker and said that because he has a non conditional pacemaker they would not be able to do his MRI on the 1.5 Tesla machine. He said the imaging would be very poor. He said since we were looking at the prostate it would be harder to see. He said that he would need a endorectal coil and the 1.5 tesla and even then the imaging would still not be very good. I asked who around here would maybe do it with the pacemaker and he said maybe [facility] but he was not 100% sure if they are able to do it due to the patient having an non conditional pacemaker.  Sorry it took so long to get an answer for you.  Sharyn Lull ----- Message ----- From: Abbie Sons, MD Sent: 01/12/2019   3:07 PM EDT To: Benard Halsted Subject: MRI                                            Any update on the [facility] MRI on this patient?  Thanks

## 2019-04-12 DIAGNOSIS — K529 Noninfective gastroenteritis and colitis, unspecified: Secondary | ICD-10-CM | POA: Insufficient documentation

## 2019-04-12 DIAGNOSIS — K2990 Gastroduodenitis, unspecified, without bleeding: Secondary | ICD-10-CM | POA: Insufficient documentation

## 2019-04-12 DIAGNOSIS — R140 Abdominal distension (gaseous): Secondary | ICD-10-CM | POA: Insufficient documentation

## 2019-04-16 ENCOUNTER — Other Ambulatory Visit
Admission: RE | Admit: 2019-04-16 | Discharge: 2019-04-16 | Disposition: A | Discharge status: Home / Self Care | Payer: Managed Care, Other (non HMO) | Source: Ambulatory Visit | Attending: Family Medicine | Admitting: Family Medicine

## 2019-04-16 DIAGNOSIS — G8929 Other chronic pain: Secondary | ICD-10-CM | POA: Diagnosis not present

## 2019-04-16 DIAGNOSIS — K529 Noninfective gastroenteritis and colitis, unspecified: Secondary | ICD-10-CM | POA: Insufficient documentation

## 2019-04-16 DIAGNOSIS — R109 Unspecified abdominal pain: Secondary | ICD-10-CM | POA: Diagnosis not present

## 2019-04-16 LAB — GASTROINTESTINAL PANEL BY PCR, STOOL (REPLACES STOOL CULTURE)

## 2019-04-16 LAB — C DIFFICILE QUICK SCREEN W PCR REFLEX
C Diff antigen: POSITIVE — AB
C Diff toxin: NEGATIVE

## 2019-04-16 LAB — CLOSTRIDIUM DIFFICILE BY PCR, REFLEXED: Toxigenic C. Difficile by PCR: NEGATIVE

## 2019-04-19 ENCOUNTER — Other Ambulatory Visit: Payer: Self-pay

## 2019-04-19 DIAGNOSIS — R972 Elevated prostate specific antigen [PSA]: Secondary | ICD-10-CM

## 2019-04-19 LAB — CALPROTECTIN, FECAL: Calprotectin, Fecal: 63 ug/g (ref 0–120)

## 2019-04-23 ENCOUNTER — Other Ambulatory Visit: Payer: Self-pay

## 2019-04-23 ENCOUNTER — Other Ambulatory Visit: Payer: Managed Care, Other (non HMO)

## 2019-04-23 DIAGNOSIS — R972 Elevated prostate specific antigen [PSA]: Secondary | ICD-10-CM

## 2019-04-23 LAB — PANCREATIC ELASTASE, FECAL: Pancreatic Elastase-1, Stool: >500 ug Elast./g (ref >200)

## 2019-04-24 LAB — PSA: Prostate Specific Ag, Serum: 8.6 ng/mL — ABNORMAL HIGH (ref 0.0–4.0)

## 2019-04-27 ENCOUNTER — Ambulatory Visit: Payer: Managed Care, Other (non HMO) | Admitting: Urology

## 2019-05-11 ENCOUNTER — Other Ambulatory Visit: Payer: Self-pay | Admitting: Student

## 2019-05-11 DIAGNOSIS — R1013 Epigastric pain: Secondary | ICD-10-CM

## 2019-05-11 DIAGNOSIS — K219 Gastro-esophageal reflux disease without esophagitis: Secondary | ICD-10-CM

## 2019-05-16 ENCOUNTER — Other Ambulatory Visit: Payer: Self-pay

## 2019-05-16 ENCOUNTER — Ambulatory Visit
Admission: RE | Admit: 2019-05-16 | Discharge: 2019-05-16 | Disposition: A | Discharge status: Home / Self Care | Payer: Managed Care, Other (non HMO) | Source: Ambulatory Visit | Attending: Student | Admitting: Student

## 2019-05-16 DIAGNOSIS — R1013 Epigastric pain: Secondary | ICD-10-CM | POA: Insufficient documentation

## 2019-05-16 DIAGNOSIS — K219 Gastro-esophageal reflux disease without esophagitis: Secondary | ICD-10-CM | POA: Insufficient documentation

## 2019-05-25 ENCOUNTER — Other Ambulatory Visit: Payer: Self-pay

## 2019-05-25 ENCOUNTER — Telehealth (INDEPENDENT_AMBULATORY_CARE_PROVIDER_SITE_OTHER): Payer: Managed Care, Other (non HMO) | Admitting: Urology

## 2019-05-25 DIAGNOSIS — R972 Elevated prostate specific antigen [PSA]: Secondary | ICD-10-CM

## 2019-05-25 DIAGNOSIS — N529 Male erectile dysfunction, unspecified: Secondary | ICD-10-CM

## 2019-05-25 NOTE — Progress Notes (Signed)
Virtual Visit via Telephone Note  I connected with Joseph [name] on 05/25/19 at  3:00 PM EDT by telephone and verified that I am speaking with the correct person using two identifiers.  Location: Patient: Home Provider: Work office   I discussed the limitations, risks, security and privacy concerns of performing an evaluation and management service by telephone and the availability of in person appointments. I also discussed with the patient that there may be a patient responsible charge related to this service. The patient expressed understanding and agreed to proceed.  The patient was unable to open video link and visit was performed via telephone  History of Present Illness:  48 y.o. male with the following urologic history:  1. Elevated PSA -TRUS/biopsy 11/2017 for PSA of 6.6 and benign DRE.  Prostate volume 43 g.  Pathology: Benign prostate tissue  2. Erectile dysfunction - on tri-mix   A PSA and in August 2019 was 7.7 and on repeat November 2019 remained elevated at 7.5.  A prostate MRI was recommended however he has a conditional pacemaker which precludes MRI.  PSA repeated 7/13 had increased to 8.6   Observations/Objective: N/A  Assessment and Plan: Elevated PSA with previous benign biopsy at 6.6.  PSA is slowly rising.  He is unable to have an MRI.  Options of observation, adjunct of blood testing and repeat prostate biopsy were discussed.  He would like to avoid a repeat biopsy.  He will come in for a lab visit for a 4K and if higher probability of Gleason 7 or greater prostate cancer he will agree to a prostate biopsy under sedation.  Follow Up Instructions: Schedule lab visit for 4K   I discussed the assessment and treatment plan with the patient. The patient was provided an opportunity to ask questions and all were answered. The patient agreed with the plan and demonstrated an understanding of the instructions.   The patient was advised to call back or seek an in-person  evaluation if the symptoms worsen or if the condition fails to improve as anticipated.  I provided 10 minutes of non-face-to-face time during this encounter.   Joseph Sons, MD

## 2019-05-31 ENCOUNTER — Other Ambulatory Visit: Payer: Self-pay

## 2019-05-31 ENCOUNTER — Ambulatory Visit: Payer: Managed Care, Other (non HMO) | Admitting: *Deleted

## 2019-05-31 DIAGNOSIS — R972 Elevated prostate specific antigen [PSA]: Secondary | ICD-10-CM

## 2019-05-31 NOTE — Progress Notes (Signed)
Patient present today for 4K score blood draw, [facility] called confirmation #[id number] verified with [facility]. Patient aware he will be notified with results.

## 2019-06-04 ENCOUNTER — Other Ambulatory Visit: Payer: Self-pay | Admitting: Urology

## 2019-07-23 ENCOUNTER — Telehealth: Payer: Self-pay | Admitting: Urology

## 2019-07-23 NOTE — Telephone Encounter (Signed)
4K showed an 8% probability of high risk prostate cancer.  The cut off between low risk and elevated risk is 7.5%  Recc f/u PSA/DRE 09/2019.  The other option is repeat biopsy

## 2019-07-23 NOTE — Telephone Encounter (Signed)
Called pt informed him of the information below. Pt gave verbal understanding. Lab ordered. Pt's appt for December scheduled.

## 2019-07-24 ENCOUNTER — Other Ambulatory Visit: Payer: Self-pay

## 2019-07-24 DIAGNOSIS — Z20822 Contact with and (suspected) exposure to covid-19: Secondary | ICD-10-CM

## 2019-07-26 LAB — NOVEL CORONAVIRUS, NAA: SARS-CoV-2, NAA: NOT DETECTED

## 2019-07-27 ENCOUNTER — Telehealth: Payer: Self-pay | Admitting: General Practice

## 2019-07-27 NOTE — Telephone Encounter (Signed)
Pt called in for covid results     °Advised of Not Detected result.  °

## 2019-09-12 ENCOUNTER — Encounter: Payer: Self-pay | Admitting: Emergency Medicine

## 2019-09-12 ENCOUNTER — Other Ambulatory Visit: Payer: Self-pay

## 2019-09-12 ENCOUNTER — Emergency Department
Admission: EM | Admit: 2019-09-12 | Discharge: 2019-09-12 | Disposition: A | Discharge status: Home / Self Care | Payer: Self-pay | Attending: Emergency Medicine | Admitting: Emergency Medicine

## 2019-09-12 ENCOUNTER — Emergency Department: Payer: Self-pay

## 2019-09-12 DIAGNOSIS — Z79899 Other long term (current) drug therapy: Secondary | ICD-10-CM | POA: Insufficient documentation

## 2019-09-12 DIAGNOSIS — Z794 Long term (current) use of insulin: Secondary | ICD-10-CM | POA: Insufficient documentation

## 2019-09-12 DIAGNOSIS — R0981 Nasal congestion: Secondary | ICD-10-CM | POA: Insufficient documentation

## 2019-09-12 DIAGNOSIS — Z7982 Long term (current) use of aspirin: Secondary | ICD-10-CM | POA: Insufficient documentation

## 2019-09-12 DIAGNOSIS — Z955 Presence of coronary angioplasty implant and graft: Secondary | ICD-10-CM | POA: Insufficient documentation

## 2019-09-12 DIAGNOSIS — E119 Type 2 diabetes mellitus without complications: Secondary | ICD-10-CM | POA: Insufficient documentation

## 2019-09-12 DIAGNOSIS — I252 Old myocardial infarction: Secondary | ICD-10-CM | POA: Insufficient documentation

## 2019-09-12 DIAGNOSIS — I110 Hypertensive heart disease with heart failure: Secondary | ICD-10-CM | POA: Insufficient documentation

## 2019-09-12 DIAGNOSIS — I5022 Chronic systolic (congestive) heart failure: Secondary | ICD-10-CM | POA: Insufficient documentation

## 2019-09-12 DIAGNOSIS — R0982 Postnasal drip: Secondary | ICD-10-CM | POA: Insufficient documentation

## 2019-09-12 DIAGNOSIS — J9801 Acute bronchospasm: Secondary | ICD-10-CM | POA: Insufficient documentation

## 2019-09-12 MED ORDER — IPRATROPIUM-ALBUTEROL 0.5-2.5 (3) MG/3ML IN SOLN
3.0000 mL | Freq: Once | RESPIRATORY_TRACT | Status: AC
  Administered 2019-09-12: 3 mL via RESPIRATORY_TRACT
  Filled 2019-09-12: qty 3

## 2019-09-12 MED ORDER — METHYLPREDNISOLONE SODIUM SUCC 125 MG IJ SOLR
125.0000 mg | Freq: Once | INTRAMUSCULAR | Status: AC
  Administered 2019-09-12: 125 mg via INTRAMUSCULAR
  Filled 2019-09-12: qty 2

## 2019-09-12 MED ORDER — FEXOFENADINE-PSEUDOEPHED ER 60-120 MG PO TB12: 1.0000 | ORAL_TABLET | Freq: Two times a day (BID) | ORAL | 0 refills | Status: DC

## 2019-09-12 MED ORDER — METHYLPREDNISOLONE 4 MG PO TBPK: ORAL_TABLET | ORAL | 0 refills | Status: DC

## 2019-09-12 MED ORDER — BENZONATATE 100 MG PO CAPS: 200.0000 mg | ORAL_CAPSULE | Freq: Three times a day (TID) | ORAL | 0 refills | Status: DC | PRN

## 2019-09-12 NOTE — ED Notes (Signed)
C/o cough and wheezing X2 days that has gotten worse. Reports he believes this is from an allergy flare up/asthma

## 2019-09-12 NOTE — Discharge Instructions (Signed)
Follow discharge care instruction.  Monitor blood sugar closely while taking steroids.

## 2019-09-12 NOTE — ED Provider Notes (Signed)
[facility] Emergency Department Provider Note   ____________________________________________   First MD Initiated Contact with Patient 09/12/19 [phone]     (approximate)  I have reviewed the triage vital signs and the nursing notes.   HISTORY  Chief Complaint Asthma    HPI Joseph [name] is a 48 y.o. male patient presents with episodic asthma attacks for last 4 days.  Patient state has a history of allergic rhinitis which presents with cough which caused wheezing.  Patient states moderate transient relief with inhaler.  Patient the cough is productive.  Patient also complained of nasal congestion and postnasal drainage.          Past Medical History:  Diagnosis Date  . AICD (automatic cardioverter/defibrillator) present   . CHF (congestive heart failure) ([place])   . Coronary artery disease   . Diabetes mellitus without complication ([place])   . ED (erectile dysfunction)   . GERD (gastroesophageal reflux disease)   . Hyperlipidemia   . Hypertension   . Ischemic cardiomyopathy   . LADA (latent autoimmune diabetes in adults), managed as type 1 ([place])   . Myocardial infarction [facility]) 2011   anterior MI at [place] s/p BMS to ostial/proximal LAD  . Obstructive sleep apnea 2018  . Seizures ([place])    diabetes seizures (low blood sugar); last one 2016  . Systolic heart failure ([place])   . Vitamin D deficiency     Patient Active Problem List   Diagnosis Date Noted  . 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 Elevated PSA 07/25/2017  . Erectile dysfunction associated with type 2 diabetes mellitus ([place]) 07/25/2017  . Acute on chronic systolic heart failure ([place]) 11/24/2016  . Shortness of breath   . Abnormal EKG   . DKA (diabetic ketoacidoses) ([place]) 04/18/2016  . CAP (community acquired pneumonia) 04/18/2016  . Accelerated hypertension 04/18/2016  . Abdominal pain 04/18/2016    Past Surgical History:  Procedure Laterality Date  . CARDIAC DEFIBRILLATOR  PLACEMENT  08/30/2010  . CHOLECYSTECTOMY N/A 11/07/2017   Procedure: LAPAROSCOPIC CHOLECYSTECTOMY;  Surgeon: Herbert Pun, MD;  Location: [facility] ORS;  Service: General;  Laterality: N/A;  . CORONARY ANGIOPLASTY    . ESOPHAGOGASTRODUODENOSCOPY (EGD) WITH PROPOFOL N/A 03/20/2018   Procedure: ESOPHAGOGASTRODUODENOSCOPY (EGD) WITH PROPOFOL;  Surgeon: Manya Silvas, MD;  Location: [facility] ENDOSCOPY;  Service: Endoscopy;  Laterality: N/A;  . LEFT HEART CATH AND CORONARY ANGIOGRAPHY N/A 11/23/2016   Procedure: Left Heart Cath and Coronary Angiography;  Surgeon: Nelva Bush, MD;  Location: [place] CV LAB;  Service: Cardiovascular;  Laterality: N/A;    Prior to Admission medications   Medication Sig Start Date End Date Taking? Authorizing Provider  albuterol (PROVENTIL HFA) 108 (90 Base) MCG/ACT inhaler Inhale 2 puffs into the lungs every 4 (four) hours as needed for wheezing or shortness of breath. 10/08/18   Carrie Mew, MD  aspirin EC 81 MG tablet Take 81 mg by mouth every morning.    [provider]  atorvastatin (LIPITOR) 80 MG tablet Take 80 mg by mouth at bedtime.  02/09/16   [provider]  benzonatate (TESSALON PERLES) 100 MG capsule Take 2 capsules (200 mg total) by mouth 3 (three) times daily as needed. 09/12/19 09/11/20  Sable Feil, PA-C  carvedilol (COREG) 25 MG tablet Take 25 mg by mouth 2 (two) times daily.  02/16/16   [provider]  Cholecalciferol (D3-1000) 1000 units capsule Take 1,000 Units by mouth daily.    [provider]  clopidogrel (PLAVIX)  75 MG tablet Take by mouth. 11/26/16   [provider]  Continuous Blood Gluc Receiver (FREESTYLE LIBRE 14 DAY READER) DEVI Use 1 Device continuously as needed 02/07/18   [provider]  Continuous Blood Gluc Sensor (FREESTYLE LIBRE 14 DAY SENSOR) MISC Use 1 Device every 14 (fourteen) days 02/07/18   [provider]  digoxin (LANOXIN) 0.125 MG tablet Take 125  mcg by mouth daily. 02/16/16   [provider]  diphenoxylate-atropine (LOMOTIL) 2.5-0.025 MG tablet  06/01/18   [provider]  fexofenadine-pseudoephedrine (ALLEGRA-D) 60-120 MG 12 hr tablet Take 1 tablet by mouth 2 (two) times daily. 09/12/19   Sable Feil, PA-C  fluticasone (FLONASE) 50 MCG/ACT nasal spray Place 2 sprays into both nostrils daily as needed for allergies.  01/26/16   [provider]  furosemide (LASIX) 20 MG tablet Take 1 tablet (20 mg total) by mouth 2 (two) times daily. 11/25/16   Dustin Flock, MD  glucagon, human recombinant, (GLUCAGEN DIAGNOSTIC) 1 MG injection Inject into the muscle. 02/07/18   [provider]  HUMALOG KWIKPEN 100 UNIT/ML KiwkPen Inject 18-22 Units into the skin See admin instructions. Inject 18 units subcutaneous in the morning, inject 20 units subcutaneous at lunchtime, and inject 22 units subcutaneous at night. Plus sliding scale as directed. 03/09/16   [provider]  Insulin Pen Needle (B-D ULTRAFINE III SHORT PEN) 31G X 8 MM MISC USE 5 TIMES DAILY AS  DIRECTED 05/19/17   [provider]  LANTUS SOLOSTAR 100 UNIT/ML Solostar Pen Inject 16-30 Units into the skin See admin instructions. Inject 30 units SQ in the morning and inject 16 units SQ in the evening 04/27/17   [provider]  loratadine (CLARITIN) 10 MG tablet Take 10 mg by mouth daily.    [provider]  losartan (COZAAR) 100 MG tablet Take 100 mg by mouth at bedtime.    [provider]  metFORMIN (GLUCOPHAGE-XR) 500 MG 24 hr tablet  05/16/18   [provider]  methylPREDNISolone (MEDROL DOSEPAK) 4 MG TBPK tablet Take Tapered dose as directed 09/12/19   Sable Feil, PA-C  metoCLOPramide (REGLAN) 10 MG tablet Take 10 mg by mouth 4 (four) times daily.    [provider]  pantoprazole (PROTONIX) 40 MG tablet Take 1 tablet (40 mg total) by mouth daily. 04/21/16   [name], MD  potassium  chloride (KLOR-CON M10) 10 MEQ tablet TAKE ONE TABLET BY MOUTH EVERY DAY 07/28/12   [provider]  potassium chloride (MICRO-K) 10 MEQ CR capsule Take 10 mEq by mouth daily. 02/23/16   [provider]  spironolactone (ALDACTONE) 25 MG tablet Take 12.5 mg by mouth daily. 03/01/17   [provider]    Allergies Vancomycin and Lisinopril  Family History  Problem Relation Age of Onset  . Hypertension Mother   . Heart attack Paternal Grandmother   . Heart attack Father   . Bladder Cancer Neg Hx   . Kidney cancer Neg Hx   . Prostate cancer Neg Hx     Social History Social History   Tobacco Use  . Smoking status: Never Smoker  . Smokeless tobacco: Never Used  Substance Use Topics  . Alcohol use: No  . Drug use: No    Review of Systems  Constitutional: No fever/chills Eyes: No visual changes. ENT: No sore throat. Cardiovascular: Denies chest pain.  A. fib with implanted Respiratory: Denies shortness of breath. Gastrointestinal: No abdominal pain.  No nausea, no vomiting.  No diarrhea.  No constipation. Genitourinary: Negative for dysuria. Musculoskeletal: Negative for back pain. Skin: Negative for rash. Neurological: Negative for headaches, focal weakness or numbness. {**Psychiatric:  Endocrine:  Diabetes, hyperlipidemia, hypertension. Hematological/Lymphatic:  Allergic/Immunilogical: Vancomycin and lisinopril  ____________________________________________   PHYSICAL EXAM:  VITAL SIGNS: ED Triage Vitals  Enc Vitals Group     BP 09/12/19 0658 (!) 156/71     Pulse Rate 09/12/19 0658 83     Resp 09/12/19 0658 (!) 22     Temp 09/12/19 0658 98.4 F (36.9 C)     Temp Source 09/12/19 0658 Oral     SpO2 09/12/19 0658 95 %     Weight 09/12/19 0656 218 lb (98.9 kg)     Height 09/12/19 0656 6\' 1"  (1.854 m)     Head Circumference --      Peak Flow --      Pain Score 09/12/19 0652 0     Pain Loc --      Pain Edu? --      Excl. in [place]? --      Constitutional: Alert and oriented. Well appearing and in no acute distress. Nose: Edematous nasal turbinates with clear rhinorrhea. Mouth/Throat: Mucous membranes are moist.  Oropharynx non-erythematous.  Postnasal drainage. Neck: No stridor.   Cardiovascular: Normal rate, regular rhythm. Grossly normal heart sounds.  Good peripheral circulation. Respiratory: Normal respiratory effort.  No retractions. Lungs mild expiratory wheezing. Skin:  Skin is warm, dry and intact. No rash noted. Psychiatric: Mood and affect are normal. Speech and behavior are normal.  ____________________________________________   LABS (all labs ordered are listed, but only abnormal results are displayed)  Labs Reviewed - No data to display ____________________________________________  EKG   ____________________________________________  RADIOLOGY  ED MD interpretation:    Official radiology report(s): Dg Chest Portable 1 View  Result Date: 09/12/2019 CLINICAL DATA:  Cough and wheezing for 3 days. Additional history provided: Recent productive cough EXAM: PORTABLE CHEST 1 VIEW COMPARISON:  Chest radiograph 10/08/2018 FINDINGS: Left chest single lead AICD device. Heart size within normal limits. No evidence of airspace consolidation. No evidence of pneumothorax or pleural effusion. No acute bony abnormality. IMPRESSION: No airspace consolidation. Electronically Signed   By: Kellie Simmering DO   On: 09/12/2019 08:33    ____________________________________________   PROCEDURES  Procedure(s) performed (including Critical Care):  Procedures   ____________________________________________   INITIAL IMPRESSION / ASSESSMENT AND PLAN / ED COURSE  As part of my medical decision making, I reviewed the following data within the [place]     Patient presents with cough and wheezing for several days secondary to change in weather.  Discussed neck x-ray findings with patient.  Patient physical  exam consistent with bronchospasms.  Patient given discharge care instruction advised take medication as directed.   Joseph [name] was evaluated in Emergency Department on 09/12/2019 for the symptoms described in the history of present illness. He was evaluated in the context of the global COVID-19 pandemic, which necessitated consideration that the patient might be at risk for infection with the SARS-CoV-2 virus that causes COVID-19. Institutional protocols and algorithms that pertain to the evaluation of patients at risk for COVID-19 are in a state of rapid change based on information released by regulatory bodies including the CDC and federal and state organizations. These policies and algorithms were followed during the patient's care in the ED.       ____________________________________________   FINAL CLINICAL IMPRESSION(S) / ED DIAGNOSES  Final  diagnoses:  Cough due to bronchospasm     ED Discharge Orders         Ordered    fexofenadine-pseudoephedrine (ALLEGRA-D) 60-120 MG 12 hr tablet  2 times daily     09/12/19 0936    benzonatate (TESSALON PERLES) 100 MG capsule  3 times daily PRN     09/12/19 0936    methylPREDNISolone (MEDROL DOSEPAK) 4 MG TBPK tablet     09/12/19 [id number]           Note:  This document was prepared using Dragon voice recognition software and may include unintentional dictation errors.    Sable Feil, PA-C 09/12/19 [id number]    Earleen [place], MD 09/12/19 1027

## 2019-09-12 NOTE — ED Triage Notes (Signed)
Patient ambulatory to triage with steady gait, without difficulty or distress noted, mask in place; pt reports recent asthma attacks and "would like to be checked out to see what's going on"; st has had recent prod cough white sputum

## 2019-09-13 ENCOUNTER — Other Ambulatory Visit: Payer: Self-pay | Admitting: Family Medicine

## 2019-09-13 DIAGNOSIS — R972 Elevated prostate specific antigen [PSA]: Secondary | ICD-10-CM

## 2019-09-17 ENCOUNTER — Other Ambulatory Visit: Payer: Self-pay

## 2019-09-18 ENCOUNTER — Other Ambulatory Visit: Payer: Self-pay

## 2019-09-18 DIAGNOSIS — R972 Elevated prostate specific antigen [PSA]: Secondary | ICD-10-CM

## 2019-09-19 LAB — PSA: Prostate Specific Ag, Serum: 9.5 ng/mL — ABNORMAL HIGH (ref 0.0–4.0)

## 2019-10-01 ENCOUNTER — Encounter: Payer: Self-pay | Admitting: Urology

## 2019-10-01 ENCOUNTER — Ambulatory Visit (INDEPENDENT_AMBULATORY_CARE_PROVIDER_SITE_OTHER): Payer: Self-pay | Admitting: Urology

## 2019-10-01 ENCOUNTER — Other Ambulatory Visit: Payer: Self-pay

## 2019-10-01 ENCOUNTER — Other Ambulatory Visit: Payer: Self-pay | Admitting: Urology

## 2019-10-01 VITALS — BP 112/64 | HR 85 | Ht 73.0 in | Wt 216.0 lb

## 2019-10-01 DIAGNOSIS — R972 Elevated prostate specific antigen [PSA]: Secondary | ICD-10-CM

## 2019-10-01 NOTE — Progress Notes (Signed)
10/01/2019 10:05 AM   Joseph [name] [birth year] [id number]  Referring provider: [facility] [address] [place],  [place] [address]  Chief Complaint  Patient presents with  . Elevated PSA    1 yr follow up from elevated PSA    Urologic history: 1. Elevated PSA -TRUS/biopsy 11/2017 for PSA of 6.6 and benign DRE. Prostate volume 43 g. Pathology: Benign prostate tissue  2. Erectile dysfunction - on tri-mix   HPI: 48 y.o. male presents for follow-up of an elevated PSA.  PSA has slowly risen since his biopsy with the following trend:  05/2018  7.7 08/2018 7.5 04/2019  8.6 05/2019  9.29; 4K 8% 09/2019 9.5  He has a nonconditional pacemaker and is not a candidate for a prostate MRI.    PMH: Past Medical History:  Diagnosis Date  . AICD (automatic cardioverter/defibrillator) present   . CHF (congestive heart failure) ([place])   . Coronary artery disease   . Diabetes mellitus without complication ([place])   . ED (erectile dysfunction)   . GERD (gastroesophageal reflux disease)   . Hyperlipidemia   . Hypertension   . Ischemic cardiomyopathy   . LADA (latent autoimmune diabetes in adults), managed as type 1 ([place])   . Myocardial infarction [facility]) 2011   anterior MI at [place] s/p BMS to ostial/proximal LAD  . Obstructive sleep apnea 2018  . Seizures ([place])    diabetes seizures (low blood sugar); last one 2016  . Systolic heart failure ([place])   . Vitamin D deficiency     Surgical History: Past Surgical History:  Procedure Laterality Date  . CARDIAC DEFIBRILLATOR PLACEMENT  08/30/2010  . CHOLECYSTECTOMY N/A 11/07/2017   Procedure: LAPAROSCOPIC CHOLECYSTECTOMY;  Surgeon: Herbert Pun, MD;  Location: [facility] ORS;  Service: General;  Laterality: N/A;  . CORONARY ANGIOPLASTY    . ESOPHAGOGASTRODUODENOSCOPY (EGD) WITH PROPOFOL N/A 03/20/2018   Procedure: ESOPHAGOGASTRODUODENOSCOPY (EGD) WITH PROPOFOL;  Surgeon: Manya Silvas, MD;  Location: [facility]  ENDOSCOPY;  Service: Endoscopy;  Laterality: N/A;  . LEFT HEART CATH AND CORONARY ANGIOGRAPHY N/A 11/23/2016   Procedure: Left Heart Cath and Coronary Angiography;  Surgeon: Nelva Bush, MD;  Location: [place] CV LAB;  Service: Cardiovascular;  Laterality: N/A;    Home Medications:  Allergies as of 10/01/2019      Reactions   Vancomycin Shortness Of Breath   Lisinopril Rash      Medication List       Accurate as of October 01, 2019 10:05 AM. If you have any questions, ask your nurse or doctor.        albuterol 108 (90 Base) MCG/ACT inhaler Commonly known as: Proventil HFA Inhale 2 puffs into the lungs every 4 (four) hours as needed for wheezing or shortness of breath.   aspirin EC 81 MG tablet Take 81 mg by mouth every morning.   atorvastatin 80 MG tablet Commonly known as: LIPITOR Take 80 mg by mouth at bedtime.   B-D ULTRAFINE III SHORT PEN 31G X 8 MM Misc Generic drug: Insulin Pen Needle USE 5 TIMES DAILY AS  DIRECTED   benzonatate 100 MG capsule Commonly known as: Tessalon Perles Take 2 capsules (200 mg total) by mouth 3 (three) times daily as needed.   carvedilol 25 MG tablet Commonly known as: COREG Take 25 mg by mouth 2 (two) times daily.   clopidogrel 75 MG tablet Commonly known as: PLAVIX Take by mouth.   D3-1000 25 MCG (1000 UT) capsule Generic drug: Cholecalciferol Take  1,000 Units by mouth daily.   digoxin 0.125 MG tablet Commonly known as: LANOXIN Take 125 mcg by mouth daily.   diphenoxylate-atropine 2.5-0.025 MG tablet Commonly known as: LOMOTIL   fexofenadine-pseudoephedrine 60-120 MG 12 hr tablet Commonly known as: ALLEGRA-D Take 1 tablet by mouth 2 (two) times daily.   fluticasone 50 MCG/ACT nasal spray Commonly known as: FLONASE Place 2 sprays into both nostrils daily as needed for allergies.   FreeStyle Libre 14 Day Reader Kerrin Mo Use 1 Device continuously as needed   [employer] 14 Day Sensor Misc Use 1 Device every  14 (fourteen) days   furosemide 20 MG tablet Commonly known as: LASIX Take 1 tablet (20 mg total) by mouth 2 (two) times daily.   GlucaGen Diagnostic 1 MG injection Generic drug: glucagon (human recombinant) Inject into the muscle.   HumaLOG KwikPen 100 UNIT/ML KwikPen Generic drug: insulin lispro Inject 18-22 Units into the skin [name] admin instructions. Inject 18 units subcutaneous in the morning, inject 20 units subcutaneous at lunchtime, and inject 22 units subcutaneous at night. Plus sliding scale as directed.   Klor-Con M10 10 MEQ tablet Generic drug: potassium chloride TAKE ONE TABLET BY MOUTH EVERY DAY   Lantus SoloStar 100 UNIT/ML Solostar Pen Generic drug: Insulin Glargine Inject 16-30 Units into the skin [name] admin instructions. Inject 30 units SQ in the morning and inject 16 units SQ in the evening   loratadine 10 MG tablet Commonly known as: CLARITIN Take 10 mg by mouth daily.   losartan 100 MG tablet Commonly known as: COZAAR Take 100 mg by mouth at bedtime.   metFORMIN 500 MG 24 hr tablet Commonly known as: GLUCOPHAGE-XR   methylPREDNISolone 4 MG Tbpk tablet Commonly known as: MEDROL DOSEPAK Take Tapered dose as directed   metoCLOPramide 10 MG tablet Commonly known as: REGLAN Take 10 mg by mouth 4 (four) times daily.   pantoprazole 40 MG tablet Commonly known as: PROTONIX Take 1 tablet (40 mg total) by mouth daily.   potassium chloride 10 MEQ CR capsule Commonly known as: MICRO-K Take 10 mEq by mouth daily.   spironolactone 25 MG tablet Commonly known as: ALDACTONE Take 12.5 mg by mouth daily.       Allergies:  Allergies  Allergen Reactions  . Vancomycin Shortness Of Breath  . Lisinopril Rash    Family History: Family History  Problem Relation Age of Onset  . Hypertension Mother   . Heart attack Paternal Grandmother   . Heart attack Father   . Bladder Cancer Neg Hx   . Kidney cancer Neg Hx   . Prostate cancer Neg Hx     Social  History:  reports that he has never smoked. He has never used smokeless tobacco. He reports that he does not drink alcohol or use drugs.  ROS: UROLOGY Frequent Urination?: No Hard to postpone urination?: No Burning/pain with urination?: No Get up at night to urinate?: No Leakage of urine?: No Urine stream starts and stops?: No Trouble starting stream?: No Do you have to strain to urinate?: No Blood in urine?: No Urinary tract infection?: No Sexually transmitted disease?: No Injury to kidneys or bladder?: No Painful intercourse?: No Weak stream?: No Erection problems?: Yes Penile pain?: No  Gastrointestinal Nausea?: No Vomiting?: No Indigestion/heartburn?: Yes Diarrhea?: No Constipation?: No  Constitutional Fever: No Night sweats?: No Weight loss?: No Fatigue?: No  Skin Skin rash/lesions?: No Itching?: No  Eyes Blurred vision?: No Double vision?: No  Ears/Nose/Throat Sore throat?: No Sinus problems?: Yes  Hematologic/Lymphatic Swollen glands?: No Easy bruising?: No  Cardiovascular Leg swelling?: No Chest pain?: No  Respiratory Cough?: Yes Shortness of breath?: Yes  Endocrine Excessive thirst?: Yes  Musculoskeletal Back pain?: No Joint pain?: No  Neurological Headaches?: No Dizziness?: No  Psychologic Depression?: No Anxiety?: No  Physical Exam: BP 112/64   Pulse 85   Ht 6\' 1"  (1.854 m)   Wt 216 lb (98 kg)   BMI 28.50 kg/m   Constitutional:  Alert and oriented, No acute distress. HEENT: [place] AT, moist mucus membranes.  Trachea midline, no masses. Cardiovascular: No clubbing, cyanosis, or edema.  RRR Respiratory: Normal respiratory effort, no increased work of breathing.  Clear GU: No CVA tenderness Lymph: No cervical or inguinal lymphadenopathy. Skin: No rashes, bruises or suspicious lesions. Neurologic: Grossly intact, no focal deficits, moving all 4 extremities. Psychiatric: Normal mood and affect.   Assessment & Plan:    -  Elevated PSA Prior benign prostate biopsy with a rising PSA.  He is not an MRI candidate secondary to a nonconditional pacemaker.  4K score with an 8% probability of Gleason 7 or greater prostate cancer.  I recommended scheduling a repeat standard prostate biopsy.  He would like to have this performed in same-day surgery under sedation.  The procedure was cussed including potential risk of bleeding and infection/sepsis.  We will obtain cardiology clearance to discontinue ASA and Plavix prior to biopsy.   Abbie Sons, [place] [address], [place], [place] [address] [phone]

## 2019-10-01 NOTE — H&P (View-Only) (Signed)
10/01/2019 10:05 AM   Joseph [name] 13-Jun-1971 [id number]  Referring provider: Center, [facility] [place],  [place] [address]  Chief Complaint  Patient presents with  . Elevated PSA    1 yr follow up from elevated PSA    Urologic history: 1. Elevated PSA -TRUS/biopsy 11/2017 for PSA of 6.6 and benign DRE. Prostate volume 43 g. Pathology: Benign prostate tissue  2. Erectile dysfunction - on tri-mix   HPI: 48 y.o. male presents for follow-up of an elevated PSA.  PSA has slowly risen since his biopsy with the following trend:  05/2018  7.7 08/2018 7.5 04/2019  8.6 05/2019  9.29; 4K 8% 09/2019 9.5  He has a nonconditional pacemaker and is not a candidate for a prostate MRI.    PMH: Past Medical History:  Diagnosis Date  . AICD (automatic cardioverter/defibrillator) present   . CHF (congestive heart failure) ([place])   . Coronary artery disease   . Diabetes mellitus without complication ([place])   . ED (erectile dysfunction)   . GERD (gastroesophageal reflux disease)   . Hyperlipidemia   . Hypertension   . Ischemic cardiomyopathy   . LADA (latent autoimmune diabetes in adults), managed as type 1 ([place])   . Myocardial infarction [facility]) 2011   anterior MI at [place] s/p BMS to ostial/proximal LAD  . Obstructive sleep apnea 2018  . Seizures ([place])    diabetes seizures (low blood sugar); last one 2016  . Systolic heart failure ([place])   . Vitamin D deficiency     Surgical History: Past Surgical History:  Procedure Laterality Date  . CARDIAC DEFIBRILLATOR PLACEMENT  08/30/2010  . CHOLECYSTECTOMY N/A 11/07/2017   Procedure: LAPAROSCOPIC CHOLECYSTECTOMY;  Surgeon: Herbert Pun, MD;  Location: [facility] ORS;  Service: General;  Laterality: N/A;  . CORONARY ANGIOPLASTY    . ESOPHAGOGASTRODUODENOSCOPY (EGD) WITH PROPOFOL N/A 03/20/2018   Procedure: ESOPHAGOGASTRODUODENOSCOPY (EGD) WITH PROPOFOL;  Surgeon: Manya Silvas, MD;  Location: [facility]  ENDOSCOPY;  Service: Endoscopy;  Laterality: N/A;  . LEFT HEART CATH AND CORONARY ANGIOGRAPHY N/A 11/23/2016   Procedure: Left Heart Cath and Coronary Angiography;  Surgeon: Nelva Bush, MD;  Location: [place] CV LAB;  Service: Cardiovascular;  Laterality: N/A;    Home Medications:  Allergies as of 10/01/2019      Reactions   Vancomycin Shortness Of Breath   Lisinopril Rash      Medication List       Accurate as of October 01, 2019 10:05 AM. If you have any questions, ask your nurse or doctor.        albuterol 108 (90 Base) MCG/ACT inhaler Commonly known as: Proventil HFA Inhale 2 puffs into the lungs every 4 (four) hours as needed for wheezing or shortness of breath.   aspirin EC 81 MG tablet Take 81 mg by mouth every morning.   atorvastatin 80 MG tablet Commonly known as: LIPITOR Take 80 mg by mouth at bedtime.   B-D ULTRAFINE III SHORT PEN 31G X 8 MM Misc Generic drug: Insulin Pen Needle USE 5 TIMES DAILY AS  DIRECTED   benzonatate 100 MG capsule Commonly known as: Tessalon Perles Take 2 capsules (200 mg total) by mouth 3 (three) times daily as needed.   carvedilol 25 MG tablet Commonly known as: COREG Take 25 mg by mouth 2 (two) times daily.   clopidogrel 75 MG tablet Commonly known as: PLAVIX Take by mouth.   D3-1000 25 MCG (1000 UT) capsule Generic drug: Cholecalciferol Take  1,000 Units by mouth daily.   digoxin 0.125 MG tablet Commonly known as: LANOXIN Take 125 mcg by mouth daily.   diphenoxylate-atropine 2.5-0.025 MG tablet Commonly known as: LOMOTIL   fexofenadine-pseudoephedrine 60-120 MG 12 hr tablet Commonly known as: ALLEGRA-D Take 1 tablet by mouth 2 (two) times daily.   fluticasone 50 MCG/ACT nasal spray Commonly known as: FLONASE Place 2 sprays into both nostrils daily as needed for allergies.   FreeStyle Libre 14 Day Reader Kerrin Mo Use 1 Device continuously as needed   [employer] 14 Day Sensor Misc Use 1 Device every  14 (fourteen) days   furosemide 20 MG tablet Commonly known as: LASIX Take 1 tablet (20 mg total) by mouth 2 (two) times daily.   GlucaGen Diagnostic 1 MG injection Generic drug: glucagon (human recombinant) Inject into the muscle.   HumaLOG KwikPen 100 UNIT/ML KwikPen Generic drug: insulin lispro Inject 18-22 Units into the skin [name] admin instructions. Inject 18 units subcutaneous in the morning, inject 20 units subcutaneous at lunchtime, and inject 22 units subcutaneous at night. Plus sliding scale as directed.   Klor-Con M10 10 MEQ tablet Generic drug: potassium chloride TAKE ONE TABLET BY MOUTH EVERY DAY   Lantus SoloStar 100 UNIT/ML Solostar Pen Generic drug: Insulin Glargine Inject 16-30 Units into the skin [name] admin instructions. Inject 30 units SQ in the morning and inject 16 units SQ in the evening   loratadine 10 MG tablet Commonly known as: CLARITIN Take 10 mg by mouth daily.   losartan 100 MG tablet Commonly known as: COZAAR Take 100 mg by mouth at bedtime.   metFORMIN 500 MG 24 hr tablet Commonly known as: GLUCOPHAGE-XR   methylPREDNISolone 4 MG Tbpk tablet Commonly known as: MEDROL DOSEPAK Take Tapered dose as directed   metoCLOPramide 10 MG tablet Commonly known as: REGLAN Take 10 mg by mouth 4 (four) times daily.   pantoprazole 40 MG tablet Commonly known as: PROTONIX Take 1 tablet (40 mg total) by mouth daily.   potassium chloride 10 MEQ CR capsule Commonly known as: MICRO-K Take 10 mEq by mouth daily.   spironolactone 25 MG tablet Commonly known as: ALDACTONE Take 12.5 mg by mouth daily.       Allergies:  Allergies  Allergen Reactions  . Vancomycin Shortness Of Breath  . Lisinopril Rash    Family History: Family History  Problem Relation Age of Onset  . Hypertension Mother   . Heart attack Paternal Grandmother   . Heart attack Father   . Bladder Cancer Neg Hx   . Kidney cancer Neg Hx   . Prostate cancer Neg Hx     Social  History:  reports that he has never smoked. He has never used smokeless tobacco. He reports that he does not drink alcohol or use drugs.  ROS: UROLOGY Frequent Urination?: No Hard to postpone urination?: No Burning/pain with urination?: No Get up at night to urinate?: No Leakage of urine?: No Urine stream starts and stops?: No Trouble starting stream?: No Do you have to strain to urinate?: No Blood in urine?: No Urinary tract infection?: No Sexually transmitted disease?: No Injury to kidneys or bladder?: No Painful intercourse?: No Weak stream?: No Erection problems?: Yes Penile pain?: No  Gastrointestinal Nausea?: No Vomiting?: No Indigestion/heartburn?: Yes Diarrhea?: No Constipation?: No  Constitutional Fever: No Night sweats?: No Weight loss?: No Fatigue?: No  Skin Skin rash/lesions?: No Itching?: No  Eyes Blurred vision?: No Double vision?: No  Ears/Nose/Throat Sore throat?: No Sinus problems?: Yes  Hematologic/Lymphatic Swollen glands?: No Easy bruising?: No  Cardiovascular Leg swelling?: No Chest pain?: No  Respiratory Cough?: Yes Shortness of breath?: Yes  Endocrine Excessive thirst?: Yes  Musculoskeletal Back pain?: No Joint pain?: No  Neurological Headaches?: No Dizziness?: No  Psychologic Depression?: No Anxiety?: No  Physical Exam: BP 112/64   Pulse 85   Ht 6\' 1"  (1.854 m)   Wt 216 lb (98 kg)   BMI 28.50 kg/m   Constitutional:  Alert and oriented, No acute distress. HEENT: [place] AT, moist mucus membranes.  Trachea midline, no masses. Cardiovascular: No clubbing, cyanosis, or edema.  RRR Respiratory: Normal respiratory effort, no increased work of breathing.  Clear GU: No CVA tenderness Lymph: No cervical or inguinal lymphadenopathy. Skin: No rashes, bruises or suspicious lesions. Neurologic: Grossly intact, no focal deficits, moving all 4 extremities. Psychiatric: Normal mood and affect.   Assessment & Plan:    -  Elevated PSA Prior benign prostate biopsy with a rising PSA.  He is not an MRI candidate secondary to a nonconditional pacemaker.  4K score with an 8% probability of Gleason 7 or greater prostate cancer.  I recommended scheduling a repeat standard prostate biopsy.  He would like to have this performed in same-day surgery under sedation.  The procedure was cussed including potential risk of bleeding and infection/sepsis.  We will obtain cardiology clearance to discontinue ASA and Plavix prior to biopsy.   Abbie Sons, [facility] [address] [place], [place] [address] [phone]

## 2019-10-08 ENCOUNTER — Ambulatory Visit: Payer: Self-pay | Admitting: Urology

## 2019-10-08 ENCOUNTER — Other Ambulatory Visit: Payer: Self-pay | Admitting: Urology

## 2019-10-08 DIAGNOSIS — R972 Elevated prostate specific antigen [PSA]: Secondary | ICD-10-CM

## 2019-10-15 ENCOUNTER — Other Ambulatory Visit: Payer: Self-pay | Admitting: Urology

## 2019-10-15 ENCOUNTER — Encounter
Admission: RE | Admit: 2019-10-15 | Discharge: 2019-10-15 | Disposition: A | Discharge status: Home / Self Care | Payer: PRIVATE HEALTH INSURANCE | Source: Ambulatory Visit | Attending: Urology | Admitting: Urology

## 2019-10-15 ENCOUNTER — Other Ambulatory Visit: Payer: Self-pay

## 2019-10-15 DIAGNOSIS — R972 Elevated prostate specific antigen [PSA]: Secondary | ICD-10-CM

## 2019-10-15 HISTORY — DX: Myoneural disorder, unspecified: G70.9

## 2019-10-15 NOTE — Pre-Procedure Instructions (Signed)
Left voice message on phone to have patient call back to [facility] for interview prior to upcoming surgery.

## 2019-10-15 NOTE — Patient Instructions (Addendum)
INSTRUCTIONS FOR SURGERY     Your surgery is scheduled for:   Tuesday, January 12TH, 2021     To find out your arrival time for the day of surgery,          please call [phone] between 1 pm and 3 pm on :  Monday, January 11TH, 2021     When you arrive for surgery, report to the [facility].       Do NOT stop on the first floor to register.    REMEMBER: Instructions that are not followed completely may result in serious medical risk,  up to and including death, or upon the discretion of your surgeon and anesthesiologist,            your surgery may need to be rescheduled.  __X__ 1. Do not eat food after midnight the night before your procedure.                    No gum, candy, lozenger, tic tacs, tums or hard candies.                  ABSOLUTELY NOTHING SOLID IN YOUR MOUTH AFTER MIDNIGHT                    You may drink unlimited clear liquids up to 2 hours before you are scheduled to arrive for surgery.                   Do not drink anything within those 2 hours unless you need to take medicine, then take the                   smallest amount you need.  Clear liquids include:  water, apple juice without pulp,                   any flavor Gatorade, Black coffee, black tea.  Sugar may be added but no dairy/ honey /lemon.                        Broth and jello is not considered a clear liquid.  __x__  2. On the morning of surgery, please brush your teeth with toothpaste and water. You may rinse with                  mouthwash if you wish but DO NOT SWALLOW TOOTHPASTE OR MOUTHWASH  __X___3. NO alcohol for 24 hours before or after surgery.  __x___ 4.  Do NOT smoke or use e-cigarettes for 24 HOURS PRIOR TO SURGERY.                      DO NOT use any chewable tobacco products for at least 6 hours prior to surgery.  __x___ 5. If you start any new medication after this appointment and prior to surgery,  please                   Bring it with you on the day of surgery.  ___x__ 6. Notify your doctor if there is any  change in your medical condition, such as fever, infection,                     vomitting, diarrhea or any open sores.  __x___ 7.  USE antibacterial SOAP as instructed, the night before surgery and the day of surgery.                   Once you have washed with this soap, do NOT use any of the following: Powders, perfumes                    or lotions. Please do not wear make up, hairpins, clips or nail polish. You may wear deodorant.                   Men may shave their face and neck.  Women need to shave 48 hours prior to surgery.                   DO NOT wear ANY jewelry on the day of surgery. If there are rings that are too tight to                    remove easily, please address this prior to the surgery day. Piercings need to be removed.                                                                     NO METAL ON YOUR BODY.                    Do NOT bring any valuables.  If you came to Pre-Admit testing then you will not need license,                     insurance card or credit card.  If you will be staying overnight, please either leave your things in                     the car or have your family be responsible for these items.                     [facility] IS NOT RESPONSIBLE FOR BELONGINGS OR VALUABLES.  ___X__ 8. DO NOT wear contact lenses on surgery day.  You may not have dentures,                     Hearing aides, contacts or glasses in the operating room. These items can be                    Placed in the Recovery Room to receive immediately after surgery.  __x___ 9. IF YOU ARE SCHEDULED TO GO HOME ON THE SAME DAY, YOU MUST                   Have someone to drive you home and to stay with you  for the first 24 hours.                    Have an arrangement prior to arriving on surgery day.  ___x__ 10. Take the  following medications on the morning of surgery  with a sip of water:                              1.  CARVEDILOL                     2.  DIGOXIN                     3.  ALLEGRA                     4.  FLONASE, IF NEEDED                     5.                   _____ 11.  Follow any instructions provided to you by your surgeon.                        Such as enema, clear liquid bowel prep  __X__  12. STOP  ASPIRIN AS OF:  TODAY, January 4TH                       THIS INCLUDES BC POWDERS / GOODIES POWDER  __x___ 13. STOP Anti-inflammatories as of:   January 4TH                      This includes IBUPROFEN / MOTRIN / ADVIL / ALEVE/ NAPROXYN                    YOU MAY TAKE TYLENOL ANY TIME PRIOR TO SURGERY.  _____ 14.  Stop supplements until after surgery.                     This includes: n/a                 You may continue taking Vitamin B12 / Vitamin D3 but do not take on the morning of surgery.  _____ 15. Bring your CPAP machine into preop with you on the morning of surgery.  __x____16. TAKE 1/2 OF USUAL INSULIN DOSE ON THE EVENING PRIOR TO SURGERY.                     Do NOT take any diabetes medications on surgery day.  __x____17.  Continue to take the following medications but do not take on the morning of surgery:                        Lasix // aldactone // potassium // aspirin // vitamin d3  CONTINUE TAKING ATORVASTATIN AND LOSARTAN AT NIGHT  PLEASE CONFIRM IF YOU ARE TAKING PLAVIX SO WE CAN DETERMINE WHEN TO STOP IT!!  WE WILL NEED A CLEARANCE FROM DR. PARASCHOS AS WELL AS A RECENT INTERROGATION    OF YOUR DEFIBRILLATOR.  ______18. If staying overnight, please have appropriate shoes to wear to be able to walk around the unit.                   Wear clean and comfortable clothing to the hospital.

## 2019-10-16 ENCOUNTER — Encounter: Payer: Self-pay | Admitting: Urology

## 2019-10-16 ENCOUNTER — Other Ambulatory Visit: Payer: PRIVATE HEALTH INSURANCE

## 2019-10-16 DIAGNOSIS — R972 Elevated prostate specific antigen [PSA]: Secondary | ICD-10-CM

## 2019-10-18 ENCOUNTER — Other Ambulatory Visit: Payer: Self-pay | Admitting: Radiology

## 2019-10-18 DIAGNOSIS — Z01810 Encounter for preprocedural cardiovascular examination: Secondary | ICD-10-CM | POA: Insufficient documentation

## 2019-10-18 DIAGNOSIS — Z1211 Encounter for screening for malignant neoplasm of colon: Secondary | ICD-10-CM | POA: Insufficient documentation

## 2019-10-18 LAB — CULTURE, URINE COMPREHENSIVE

## 2019-10-19 ENCOUNTER — Other Ambulatory Visit: Payer: Self-pay

## 2019-10-19 ENCOUNTER — Other Ambulatory Visit: Admission: RE | Admit: 2019-10-19 | Payer: Self-pay | Source: Ambulatory Visit

## 2019-10-19 ENCOUNTER — Encounter
Admission: RE | Admit: 2019-10-19 | Discharge: 2019-10-19 | Disposition: A | Discharge status: Home / Self Care | Payer: PRIVATE HEALTH INSURANCE | Source: Ambulatory Visit | Attending: Urology | Admitting: Urology

## 2019-10-19 DIAGNOSIS — Z20822 Contact with and (suspected) exposure to covid-19: Secondary | ICD-10-CM | POA: Diagnosis not present

## 2019-10-19 DIAGNOSIS — Z01812 Encounter for preprocedural laboratory examination: Secondary | ICD-10-CM | POA: Diagnosis not present

## 2019-10-19 DIAGNOSIS — Z01810 Encounter for preprocedural cardiovascular examination: Secondary | ICD-10-CM | POA: Insufficient documentation

## 2019-10-19 DIAGNOSIS — Z950 Presence of cardiac pacemaker: Secondary | ICD-10-CM | POA: Diagnosis not present

## 2019-10-19 DIAGNOSIS — I4510 Unspecified right bundle-branch block: Secondary | ICD-10-CM | POA: Diagnosis not present

## 2019-10-19 LAB — CBC
HCT: 44 % (ref 39.0–52.0)
Hemoglobin: 14.9 g/dL (ref 13.0–17.0)
MCH: 30 pg (ref 26.0–34.0)
MCHC: 33.9 g/dL (ref 30.0–36.0)
MCV: 88.5 fL (ref 80.0–100.0)
Platelets: 196 10*3/uL (ref 150–400)
RBC: 4.97 MIL/uL (ref 4.22–5.81)
RDW: 12.7 % (ref 11.5–15.5)
WBC: 7.5 10*3/uL (ref 4.0–10.5)
nRBC: 0 % (ref 0.0–0.2)

## 2019-10-19 LAB — BASIC METABOLIC PANEL
Anion gap: 8 (ref 5–15)
BUN: 9 mg/dL (ref 6–20)
CO2: 21 mmol/L — ABNORMAL LOW (ref 22–32)
Calcium: 8.5 mg/dL — ABNORMAL LOW (ref 8.9–10.3)
Chloride: 108 mmol/L (ref 98–111)
Creatinine, Ser: 0.77 mg/dL (ref 0.61–1.24)
GFR calc Af Amer: >60 mL/min (ref >60)
GFR calc non Af Amer: >60 mL/min (ref >60)
Glucose, Bld: 262 mg/dL — ABNORMAL HIGH (ref 70–99)
Potassium: 3.7 mmol/L (ref 3.5–5.1)
Sodium: 137 mmol/L (ref 135–145)

## 2019-10-19 LAB — SARS CORONAVIRUS 2 (TAT 6-24 HRS): SARS Coronavirus 2: NEGATIVE

## 2019-10-20 LAB — URINE CULTURE: Culture: NO GROWTH

## 2019-10-22 MED ORDER — SODIUM CHLORIDE 0.9 % IV SOLN
1.0000 g | INTRAVENOUS | Status: AC
  Administered 2019-10-23: 1 g via INTRAVENOUS
  Filled 2019-10-22: qty 1

## 2019-10-23 ENCOUNTER — Other Ambulatory Visit: Payer: Self-pay

## 2019-10-23 ENCOUNTER — Ambulatory Visit: Payer: PRIVATE HEALTH INSURANCE | Admitting: Certified Registered Nurse Anesthetist

## 2019-10-23 ENCOUNTER — Ambulatory Visit
Admission: RE | Admit: 2019-10-23 | Discharge: 2019-10-23 | Disposition: A | Discharge status: Home / Self Care | Payer: PRIVATE HEALTH INSURANCE | Attending: Urology | Admitting: Urology

## 2019-10-23 ENCOUNTER — Ambulatory Visit
Admission: RE | Admit: 2019-10-23 | Discharge: 2019-10-23 | Disposition: A | Discharge status: Home / Self Care | Payer: PRIVATE HEALTH INSURANCE | Source: Ambulatory Visit | Attending: Urology | Admitting: Urology

## 2019-10-23 ENCOUNTER — Encounter
Admission: RE | Disposition: A | Discharge status: Home / Self Care | Payer: Self-pay | Source: Home / Self Care | Attending: Urology

## 2019-10-23 ENCOUNTER — Encounter: Payer: Self-pay | Admitting: Urology

## 2019-10-23 DIAGNOSIS — R972 Elevated prostate specific antigen [PSA]: Secondary | ICD-10-CM

## 2019-10-23 DIAGNOSIS — Z7982 Long term (current) use of aspirin: Secondary | ICD-10-CM | POA: Diagnosis not present

## 2019-10-23 DIAGNOSIS — I5020 Unspecified systolic (congestive) heart failure: Secondary | ICD-10-CM | POA: Diagnosis not present

## 2019-10-23 DIAGNOSIS — I2510 Atherosclerotic heart disease of native coronary artery without angina pectoris: Secondary | ICD-10-CM | POA: Diagnosis not present

## 2019-10-23 DIAGNOSIS — E119 Type 2 diabetes mellitus without complications: Secondary | ICD-10-CM | POA: Diagnosis not present

## 2019-10-23 DIAGNOSIS — I110 Hypertensive heart disease with heart failure: Secondary | ICD-10-CM | POA: Diagnosis not present

## 2019-10-23 DIAGNOSIS — K219 Gastro-esophageal reflux disease without esophagitis: Secondary | ICD-10-CM | POA: Insufficient documentation

## 2019-10-23 DIAGNOSIS — C61 Malignant neoplasm of prostate: Secondary | ICD-10-CM | POA: Insufficient documentation

## 2019-10-23 DIAGNOSIS — Z7902 Long term (current) use of antithrombotics/antiplatelets: Secondary | ICD-10-CM | POA: Insufficient documentation

## 2019-10-23 DIAGNOSIS — Z794 Long term (current) use of insulin: Secondary | ICD-10-CM | POA: Diagnosis not present

## 2019-10-23 DIAGNOSIS — N529 Male erectile dysfunction, unspecified: Secondary | ICD-10-CM | POA: Diagnosis not present

## 2019-10-23 DIAGNOSIS — Z79899 Other long term (current) drug therapy: Secondary | ICD-10-CM | POA: Insufficient documentation

## 2019-10-23 DIAGNOSIS — E785 Hyperlipidemia, unspecified: Secondary | ICD-10-CM | POA: Diagnosis not present

## 2019-10-23 DIAGNOSIS — Z955 Presence of coronary angioplasty implant and graft: Secondary | ICD-10-CM | POA: Insufficient documentation

## 2019-10-23 DIAGNOSIS — I252 Old myocardial infarction: Secondary | ICD-10-CM | POA: Insufficient documentation

## 2019-10-23 DIAGNOSIS — G4733 Obstructive sleep apnea (adult) (pediatric): Secondary | ICD-10-CM | POA: Diagnosis not present

## 2019-10-23 DIAGNOSIS — Z950 Presence of cardiac pacemaker: Secondary | ICD-10-CM | POA: Insufficient documentation

## 2019-10-23 HISTORY — PX: PROSTATE BIOPSY: SHX241

## 2019-10-23 HISTORY — PX: TRANSRECTAL ULTRASOUND: SHX5146

## 2019-10-23 LAB — GLUCOSE, CAPILLARY
Glucose-Capillary: 243 mg/dL — ABNORMAL HIGH (ref 70–99)
Glucose-Capillary: 218 mg/dL — ABNORMAL HIGH (ref 70–99)

## 2019-10-23 SURGERY — BIOPSY, PROSTATE
Anesthesia: Monitor Anesthesia Care | Site: Prostate

## 2019-10-23 MED ORDER — MIDAZOLAM HCL 2 MG/2ML IJ SOLN
INTRAMUSCULAR | Status: DC | PRN
  Administered 2019-10-23: 2 mg via INTRAVENOUS

## 2019-10-23 MED ORDER — MIDAZOLAM HCL 2 MG/2ML IJ SOLN
INTRAMUSCULAR | Status: AC
  Filled 2019-10-23: qty 2

## 2019-10-23 MED ORDER — ACETAMINOPHEN 10 MG/ML IV SOLN: 1000.0000 mg | Freq: Once | INTRAVENOUS | Status: DC | PRN

## 2019-10-23 MED ORDER — FAMOTIDINE 20 MG PO TABS
ORAL_TABLET | ORAL | Status: AC
  Administered 2019-10-23: 20 mg via ORAL
  Filled 2019-10-23: qty 1

## 2019-10-23 MED ORDER — SODIUM CHLORIDE 0.9 % IV SOLN: INTRAVENOUS | Status: DC

## 2019-10-23 MED ORDER — OXYCODONE HCL 5 MG/5ML PO SOLN: 5.0000 mg | Freq: Once | ORAL | Status: AC | PRN

## 2019-10-23 MED ORDER — ONDANSETRON HCL 4 MG/2ML IJ SOLN: 4.0000 mg | Freq: Once | INTRAMUSCULAR | Status: DC | PRN

## 2019-10-23 MED ORDER — OXYCODONE HCL 5 MG PO TABS
ORAL_TABLET | ORAL | Status: AC
  Filled 2019-10-23: qty 1

## 2019-10-23 MED ORDER — PROPOFOL 10 MG/ML IV BOLUS
INTRAVENOUS | Status: AC
  Filled 2019-10-23: qty 20

## 2019-10-23 MED ORDER — FENTANYL CITRATE (PF) 100 MCG/2ML IJ SOLN
INTRAMUSCULAR | Status: DC | PRN
  Administered 2019-10-23 (×2): 50 ug via INTRAVENOUS

## 2019-10-23 MED ORDER — FENTANYL CITRATE (PF) 100 MCG/2ML IJ SOLN: 25.0000 ug | INTRAMUSCULAR | Status: DC | PRN

## 2019-10-23 MED ORDER — OXYCODONE HCL 5 MG PO TABS
5.0000 mg | ORAL_TABLET | Freq: Once | ORAL | Status: AC | PRN
  Administered 2019-10-23: 5 mg via ORAL

## 2019-10-23 MED ORDER — FENTANYL CITRATE (PF) 100 MCG/2ML IJ SOLN
INTRAMUSCULAR | Status: AC
  Filled 2019-10-23: qty 2

## 2019-10-23 MED ORDER — FAMOTIDINE 20 MG PO TABS: 20.0000 mg | ORAL_TABLET | Freq: Once | ORAL | Status: AC

## 2019-10-23 SURGICAL SUPPLY — 12 items
COVER MAYO STAND REUSABLE (DRAPES) ×2 IMPLANT
DRSG TELFA 3X8 NADH (GAUZE/BANDAGES/DRESSINGS) ×2 IMPLANT
GAUZE SPONGE 4X4 12PLY STRL (GAUZE/BANDAGES/DRESSINGS) ×2 IMPLANT
GLOVE BIO SURGEON STRL SZ8 (GLOVE) ×2 IMPLANT
GUIDE NDL ENDOCAV 16-18 CVR (NEEDLE) IMPLANT
GUIDE NEEDLE ENDOCAV 16-18 CVR (NEEDLE) IMPLANT
INST BIOPSY MAXCORE 18GX25 (NEEDLE) ×2 IMPLANT
KIT TURNOVER CYSTO (KITS) ×2 IMPLANT
NDL GUIDE BIOPSY 644068 (NEEDLE) ×1 IMPLANT
NEEDLE GUIDE BIOPSY 644068 (NEEDLE) ×2 IMPLANT
PAD DRESSING TELFA 3X8 NADH (GAUZE/BANDAGES/DRESSINGS) ×1 IMPLANT
SURGILUBE 2OZ TUBE FLIPTOP (MISCELLANEOUS) ×2 IMPLANT

## 2019-10-23 NOTE — Progress Notes (Signed)
Pt tolerating PO fluids

## 2019-10-23 NOTE — Discharge Instructions (Signed)
-  It will be normal to have blood per rectum for the next 24 hours and blood in the urine intermittently for up to 2 weeks -Blood in the semen a last 6-8 weeks.  Minimal activity today.  Do not drive or operate machinery for the next 24 hours.  Contact our office at [phone] should you have excessive bleeding from the rectum or urine; fever greater than 101 degrees or urinary difficulty.  You will be contacted with your pathology report.  AMBULATORY SURGERY  DISCHARGE INSTRUCTIONS   1) The drugs that you were given will stay in your system until tomorrow so for the next 24 hours you should not:  A) Drive an automobile B) Make any legal decisions C) Drink any alcoholic beverage   2) You may resume regular meals tomorrow.  Today it is better to start with liquids and gradually work up to solid foods.  You may eat anything you prefer, but it is better to start with liquids, then soup and crackers, and gradually work up to solid foods.   3) Please notify your doctor immediately if you have any unusual bleeding, trouble breathing, redness and pain at the surgery site, drainage, fever, or pain not relieved by medication.    4) Additional Instructions:        Please contact your physician with any problems or Same Day Surgery at [phone], Monday through Friday 6 am to 4 pm, or [place] at [facility] number at [phone].

## 2019-10-23 NOTE — Op Note (Signed)
Preoperative diagnosis:  1. Elevated PSA  Postoperative diagnosis:  1. Elevated PSA  Procedure: 1. Transrectal ultrasound prostate 2. Transrectal prostate biopsy  Surgeon: Abbie Sons, MD  Anesthesia: MAC  Complications: None  Intraoperative findings:  Prostate volume calculated 41 cc.  Moderate transition zone enlargement.  No echogenic abnormalities of the peripheral zone noted  EBL: Minimal  Specimens: Standard 12 core needle biopsies prostate  Indication: Joseph [name] is a 49 y.o. patient with with a history of an elevated PSA status post prostate biopsy 2019 for PSA of 6.6 with benign pathology.  PSA has been slowly rising since 2019 and was 9.15 September 2019.  4K score 8% probability of Gleason 7 or greater prostate cancer which was considered an elevated risk.  He was not an MRI candidate secondary to a nonconditional pacemaker. After reviewing the management options for treatment, he elected to proceed with the above surgical procedure(s). We have discussed the potential benefits and risks of the procedure, side effects of the proposed treatment, the likelihood of the patient achieving the goals of the procedure, and any potential problems that might occur during the procedure or recuperation. Informed consent has been obtained.  Description of procedure:  The patient was taken to the operating room and was kept on the transport stretcher.  Sedation was obtained by anesthesia after he was placed in the left lateral decubitus position.  A preoperative timeout was performed.  Once adequate sedation was obtained DRE was performed and estimated prostate volume was 40 g without nodules.   A transrectal ultrasound probe was lubricated and gently placed per rectum.  Transrectal ultrasound was performed and transverse and sagittal planes with findings as described above.  Standard 12 core template needle biopsies were then performed with all cores in separate containers.  No  significant bleeding was noted.  He was then placed in the supine position and transported to the PACU in stable condition.  Plan: He will be contacted with the pathology report and follow-up will be determined at that time.  Abbie Sons, M.D.

## 2019-10-23 NOTE — Anesthesia Postprocedure Evaluation (Signed)
Anesthesia Post Note  Patient: Joseph [name]  Procedure(s) Performed: PROSTATE BIOPSY (N/A Prostate) TRANSRECTAL ULTRASOUND (N/A Prostate)  Patient location during evaluation: PACU Anesthesia Type: MAC Level of consciousness: awake and alert Pain management: pain level controlled Vital Signs Assessment: post-procedure vital signs reviewed and stable Respiratory status: spontaneous breathing, nonlabored ventilation, respiratory function stable and patient connected to nasal cannula oxygen Cardiovascular status: stable and blood pressure returned to baseline Postop Assessment: no apparent nausea or vomiting Anesthetic complications: no     Last Vitals:  Vitals:   10/23/19 0837 10/23/19 0846  BP: (!) 93/46 113/72  Pulse: 73 71  Resp: 13 16  Temp: (!) 36.2 C (!) 36.2 C  SpO2: 95% 100%    Last Pain:  Vitals:   10/23/19 0851  TempSrc:   PainSc: 4                  Arita Miss

## 2019-10-23 NOTE — Anesthesia Preprocedure Evaluation (Signed)
Anesthesia Evaluation  Patient identified by MRN, date of birth, ID band Patient awake    Reviewed: Allergy & Precautions, NPO status , Patient's Chart, lab work & pertinent test results  History of Anesthesia Complications Negative for: history of anesthetic complications  Airway Mallampati: II  TM Distance: >3 FB Neck ROM: Full    Dental no notable dental hx. (+) Teeth Intact   Pulmonary neg pulmonary ROS, asthma , sleep apnea and Continuous Positive Airway Pressure Ventilation , neg COPD, Patient abstained from smoking.Not current smoker,    Pulmonary exam normal breath sounds clear to auscultation       Cardiovascular Exercise Tolerance: Good METShypertension, Pt. on medications + CAD, + Past MI, + Cardiac Stents and +CHF  negative cardio ROS  (-) dysrhythmias + Cardiac Defibrillator  Rhythm:Regular Rate:Normal - Systolic murmurs TTE 7846: - Left ventricle: The cavity size was mildly dilated. Wall   thickness was increased in a pattern of moderate LVH. Systolic   function was severely reduced. The estimated ejection fraction   was in the range of 25% to 30%. There is thinning and   akinesis/dyskinesis of the anterior wall, anteroseptal wall, and   apex. Doppler parameters are consistent with restrictive   physiology, indicative of decreased left ventricular diastolic   compliance and/or increased left atrial pressure. - Mitral valve: There was mild to moderate regurgitation. - Left atrium: The atrium was mildly dilated. - Right ventricle: The cavity size was normal. Systolic function   was normal. - Pericardium, extracardiac: A small pericardial effusion was   identified posterior to the heart.    Neuro/Psych negative neurological ROS  negative psych ROS   GI/Hepatic GERD  Controlled,(+)     (-) substance abuse  ,   Endo/Other  diabetes, Type 1, Insulin Dependent  Renal/GU negative Renal ROS      Musculoskeletal   Abdominal   Peds  Hematology   Anesthesia Other Findings Past Medical History: 2011: AICD (automatic cardioverter/defibrillator) present No date: CHF (congestive heart failure) (HCC) No date: Coronary artery disease No date: Diabetes mellitus without complication ([facility]) No date: ED (erectile dysfunction) No date: GERD (gastroesophageal reflux disease) No date: Hyperlipidemia No date: Hypertension No date: Ischemic cardiomyopathy No date: LADA (latent autoimmune 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 finger tips have been numb/tingling.               voltaren works 2018: Obstructive sleep apnea     Comment:  cpap. has not used lately. uses a breathe-right strip on              nose No date: Seizures ([place])     Comment:  diabetes seizures (low blood sugar); last one 96/2952 No date: Systolic heart failure ([place]) No date: Vitamin D deficiency  Reproductive/Obstetrics                             Anesthesia Physical Anesthesia Plan  ASA: IV  Anesthesia Plan: MAC and General   Post-op Pain Management:    Induction: Intravenous  PONV Risk Score and Plan: 1 and Ondansetron  Airway Management Planned: Oral ETT  Additional Equipment: None  Intra-op Plan:   Post-operative Plan: Extubation in OR  Informed Consent: I have reviewed the patients  History and Physical, chart, labs and discussed the procedure including the risks, benefits and alternatives for the proposed anesthesia with the patient or authorized representative who has indicated his/her understanding and acceptance.     Dental advisory given  Plan Discussed with: CRNA and Surgeon  Anesthesia Plan Comments: (Discussed risks of anesthesia with patient, including PONV, sore throat, lip/dental  damage. Rare risks discussed as well, such as cardiorespiratory sequelae. Patient understands.  Patient counseled on being high risk for procedure. Patient was told about increased risk of cardiac and respiratory events, including death. Patient understands. )        Anesthesia Quick Evaluation

## 2019-10-23 NOTE — Interval H&P Note (Signed)
History and Physical Interval Note:  10/23/2019 7:21 AM  Joseph [name]  has presented today for surgery, with the diagnosis of elevated PSA.  The various methods of treatment have been discussed with the patient and family. After consideration of risks, benefits and other options for treatment, the patient has consented to  Procedure(s): PROSTATE BIOPSY (N/A) TRANSRECTAL ULTRASOUND (N/A) as a surgical intervention.  The patient's history has been reviewed, patient examined, no change in status, stable for surgery.  I have reviewed the patient's chart and labs.  Questions were answered to the patient's satisfaction.     [place]

## 2019-10-23 NOTE — Transfer of Care (Signed)
Immediate Anesthesia Transfer of Care Note  Patient: SARP VERNIER  Procedure(s) Performed: PROSTATE BIOPSY (N/A Prostate) TRANSRECTAL ULTRASOUND (N/A Prostate)  Patient Location: PACU  Anesthesia Type:MAC  Level of Consciousness: awake, alert  and oriented  Airway & Oxygen Therapy: Patient Spontanous Breathing  Post-op Assessment: Report given to RN and Post -op Vital signs reviewed and stable  Post vital signs: Reviewed and stable  Last Vitals:  Vitals Value Taken Time  BP 95/50 10/23/19 0756  Temp    Pulse 68 10/23/19 0759  Resp 10 10/23/19 0759  SpO2 95 % 10/23/19 0759  Vitals shown include unvalidated device data.  Last Pain:  Vitals:   10/23/19 0633  TempSrc: Tympanic  PainSc: 0-No pain         Complications: No apparent anesthesia complications

## 2019-10-24 ENCOUNTER — Other Ambulatory Visit: Payer: Self-pay | Admitting: Anatomic Pathology & Clinical Pathology

## 2019-10-24 LAB — SURGICAL PATHOLOGY

## 2019-10-29 ENCOUNTER — Telehealth: Payer: Self-pay | Admitting: Urology

## 2019-10-29 NOTE — Telephone Encounter (Signed)
I contacted Joseph [name] to discuss his prostate biopsy report.  I got his voicemail and was unable to leave a message as the mailbox was full.  Will try back later today or tomorrow.

## 2019-10-30 NOTE — Telephone Encounter (Signed)
Called this evening and got VM again.  Unable to leave message due to full mailbox

## 2019-11-13 NOTE — Telephone Encounter (Signed)
Attempted to contact patient again regarding his positive prostate biopsy results.  I once again received his voicemail with a full mailbox and unable to leave message..  No contacts listed on DPR.  He called back but when I tried to contact him again got his voicemail again.

## 2019-11-20 ENCOUNTER — Telehealth: Payer: Self-pay | Admitting: Urology

## 2019-11-20 DIAGNOSIS — C61 Malignant neoplasm of prostate: Secondary | ICD-10-CM

## 2019-11-20 NOTE — Telephone Encounter (Signed)
Unable to leave a message   Voicemail is full

## 2019-11-20 NOTE — Telephone Encounter (Signed)
Mr. Colbeck called back today for his prostate pathology results.  Biopsy was performed under anesthesia on 1/12 for an elevated PSA of 9.5 and 4K with an 8% probability of Gleason 7 or greater prostate cancer.  He did not have a postop follow-up and I contacted him several times to discuss however his voice mailbox was full.  He had no post biopsy complaints.  The pathology result was discussed in detail.  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  3/12+ for Gleason 3+3 and 2/12+ for Gleason 3+4.  NCCN risk stratification is intermediate (favorable).  He has not had pelvic imaging and a CT abdomen/pelvis was ordered.  We briefly discussed curative treatment options of radical prostatectomy and radiation therapy.  Based on his age radical prostatectomy typically is preferred however he does have significant medical comorbidities.  He was interested and speaking with radiation oncology.  Will discuss with my partners RALP.

## 2019-11-23 NOTE — Telephone Encounter (Signed)
Unable to leave message, Voicemail full

## 2019-11-27 ENCOUNTER — Telehealth: Payer: Self-pay | Admitting: Radiology

## 2019-11-27 NOTE — Telephone Encounter (Signed)
Patient would like to schedule surgery discussed with Dr Bernardo Heater.

## 2019-11-28 ENCOUNTER — Telehealth: Payer: Self-pay | Admitting: *Deleted

## 2019-11-28 NOTE — Telephone Encounter (Signed)
Attempted to call patient multiple times to notify of time change to his appointment due to inclement weather tomorrow.   Unable to leave voicemail due his mailbox being full.

## 2019-11-29 ENCOUNTER — Ambulatory Visit: Payer: PRIVATE HEALTH INSURANCE | Admitting: Radiation Oncology

## 2019-11-30 NOTE — Telephone Encounter (Signed)
appointment made. Patient aware.

## 2019-12-01 ENCOUNTER — Encounter: Payer: Self-pay | Admitting: Emergency Medicine

## 2019-12-01 ENCOUNTER — Emergency Department
Admission: EM | Admit: 2019-12-01 | Discharge: 2019-12-01 | Disposition: A | Discharge status: Home / Self Care | Payer: PRIVATE HEALTH INSURANCE | Attending: Emergency Medicine | Admitting: Emergency Medicine

## 2019-12-01 ENCOUNTER — Other Ambulatory Visit: Payer: Self-pay

## 2019-12-01 DIAGNOSIS — I2510 Atherosclerotic heart disease of native coronary artery without angina pectoris: Secondary | ICD-10-CM | POA: Diagnosis not present

## 2019-12-01 DIAGNOSIS — Z79899 Other long term (current) drug therapy: Secondary | ICD-10-CM | POA: Diagnosis not present

## 2019-12-01 DIAGNOSIS — E1165 Type 2 diabetes mellitus with hyperglycemia: Secondary | ICD-10-CM | POA: Diagnosis not present

## 2019-12-01 DIAGNOSIS — I110 Hypertensive heart disease with heart failure: Secondary | ICD-10-CM | POA: Insufficient documentation

## 2019-12-01 DIAGNOSIS — Z95810 Presence of automatic (implantable) cardiac defibrillator: Secondary | ICD-10-CM | POA: Insufficient documentation

## 2019-12-01 DIAGNOSIS — Z794 Long term (current) use of insulin: Secondary | ICD-10-CM | POA: Insufficient documentation

## 2019-12-01 DIAGNOSIS — I5022 Chronic systolic (congestive) heart failure: Secondary | ICD-10-CM | POA: Insufficient documentation

## 2019-12-01 DIAGNOSIS — Z7982 Long term (current) use of aspirin: Secondary | ICD-10-CM | POA: Diagnosis not present

## 2019-12-01 LAB — URINALYSIS, COMPLETE (UACMP) WITH MICROSCOPIC
Bacteria, UA: NONE SEEN
Bilirubin Urine: NEGATIVE
Glucose, UA: >=500 mg/dL — AB
Ketones, ur: NEGATIVE mg/dL
Leukocytes,Ua: NEGATIVE
Nitrite: NEGATIVE
Protein, ur: NEGATIVE mg/dL
Specific Gravity, Urine: 1.027 (ref 1.005–1.030)
Squamous Epithelial / HPF: NONE SEEN (ref 0–5)
WBC, UA: NONE SEEN WBC/hpf (ref 0–5)
pH: 5 (ref 5.0–8.0)

## 2019-12-01 LAB — BASIC METABOLIC PANEL
Anion gap: 11 (ref 5–15)
BUN: 20 mg/dL (ref 6–20)
CO2: 25 mmol/L (ref 22–32)
Calcium: 8.5 mg/dL — ABNORMAL LOW (ref 8.9–10.3)
Chloride: 97 mmol/L — ABNORMAL LOW (ref 98–111)
Creatinine, Ser: 1.24 mg/dL (ref 0.61–1.24)
GFR calc Af Amer: >60 mL/min (ref >60)
GFR calc non Af Amer: >60 mL/min (ref >60)
Glucose, Bld: 652 mg/dL (ref 70–99)
Potassium: 4.1 mmol/L (ref 3.5–5.1)
Sodium: 133 mmol/L — ABNORMAL LOW (ref 135–145)

## 2019-12-01 LAB — CBC
HCT: 45.8 % (ref 39.0–52.0)
Hemoglobin: 15.4 g/dL (ref 13.0–17.0)
MCH: 30.4 pg (ref 26.0–34.0)
MCHC: 33.6 g/dL (ref 30.0–36.0)
MCV: 90.3 fL (ref 80.0–100.0)
Platelets: 169 10*3/uL (ref 150–400)
RBC: 5.07 MIL/uL (ref 4.22–5.81)
RDW: 12.4 % (ref 11.5–15.5)
WBC: 7.5 10*3/uL (ref 4.0–10.5)
nRBC: 0 % (ref 0.0–0.2)

## 2019-12-01 LAB — GLUCOSE, CAPILLARY
Glucose-Capillary: 523 mg/dL (ref 70–99)
Glucose-Capillary: 347 mg/dL — ABNORMAL HIGH (ref 70–99)
Glucose-Capillary: 289 mg/dL — ABNORMAL HIGH (ref 70–99)

## 2019-12-01 MED ORDER — ONDANSETRON HCL 4 MG/2ML IJ SOLN
INTRAMUSCULAR | Status: AC
  Administered 2019-12-01: 10:00:00 4 mg via INTRAVENOUS
  Filled 2019-12-01: qty 2

## 2019-12-01 MED ORDER — DIGOXIN 125 MCG PO TABS: 125.0000 ug | ORAL_TABLET | Freq: Every day | ORAL | 0 refills | Status: DC

## 2019-12-01 MED ORDER — BD PEN NEEDLE SHORT U/F 31G X 8 MM MISC: 0 refills | Status: DC

## 2019-12-01 MED ORDER — SODIUM CHLORIDE 0.9 % IV BOLUS (SEPSIS)
1000.0000 mL | Freq: Once | INTRAVENOUS | Status: AC
  Administered 2019-12-01: 07:00:00 1000 mL via INTRAVENOUS

## 2019-12-01 MED ORDER — SPIRONOLACTONE 25 MG PO TABS: 12.5000 mg | ORAL_TABLET | Freq: Every day | ORAL | 0 refills | Status: DC

## 2019-12-01 MED ORDER — HUMALOG KWIKPEN 100 UNIT/ML ~~LOC~~ SOPN: 18.0000 [IU] | PEN_INJECTOR | SUBCUTANEOUS | 0 refills | Status: DC

## 2019-12-01 MED ORDER — ONDANSETRON HCL 4 MG/2ML IJ SOLN: 4.0000 mg | Freq: Once | INTRAMUSCULAR | Status: AC

## 2019-12-01 MED ORDER — CARVEDILOL 25 MG PO TABS: 25.0000 mg | ORAL_TABLET | Freq: Two times a day (BID) | ORAL | 0 refills | Status: DC

## 2019-12-01 MED ORDER — TOUJEO MAX SOLOSTAR 300 UNIT/ML ~~LOC~~ SOPN: 44.0000 [IU] | PEN_INJECTOR | Freq: Every day | SUBCUTANEOUS | 0 refills | Status: DC

## 2019-12-01 MED ORDER — PREDNISONE 20 MG PO TABS: 20.0000 mg | ORAL_TABLET | Freq: Every day | ORAL | 0 refills | Status: DC

## 2019-12-01 MED ORDER — INSULIN ASPART 100 UNIT/ML ~~LOC~~ SOLN
10.0000 [IU] | Freq: Once | SUBCUTANEOUS | Status: AC
  Administered 2019-12-01: 10 [IU] via INTRAVENOUS
  Filled 2019-12-01: qty 1

## 2019-12-01 MED ORDER — AZITHROMYCIN 250 MG PO TABS: 250.0000 mg | ORAL_TABLET | Freq: Every day | ORAL | 0 refills | Status: DC

## 2019-12-01 MED ORDER — FUROSEMIDE 20 MG PO TABS: 20.0000 mg | ORAL_TABLET | Freq: Two times a day (BID) | ORAL | 0 refills | Status: DC

## 2019-12-01 MED ORDER — INSULIN GLARGINE 100 UNIT/ML ~~LOC~~ SOLN
30.0000 [IU] | Freq: Once | SUBCUTANEOUS | Status: AC
  Administered 2019-12-01: 09:00:00 30 [IU] via SUBCUTANEOUS
  Filled 2019-12-01: qty 0.3

## 2019-12-01 MED ORDER — LOSARTAN POTASSIUM 100 MG PO TABS: 100.0000 mg | ORAL_TABLET | Freq: Every day | ORAL | 0 refills | Status: DC

## 2019-12-01 MED ORDER — SODIUM CHLORIDE 0.9 % IV BOLUS
1000.0000 mL | Freq: Once | INTRAVENOUS | Status: AC
  Administered 2019-12-01: 1000 mL via INTRAVENOUS

## 2019-12-01 NOTE — ED Triage Notes (Signed)
Pt arrives ambulatory to triage with c/o being out of his insulin x 1 week. Pt reports feeling overall "sick". Pt states that he uses a company to mail his meds and that they are still not here.

## 2019-12-01 NOTE — ED Provider Notes (Signed)
[facility] Emergency Department Provider Note  ____________________________________________  Time seen: Approximately 9:00 AM  I have reviewed the triage vital signs and the nursing notes.   HISTORY  Chief Complaint Hyperglycemia    HPI Joseph [name] is a 49 y.o. male with a history of CHF diabetes GERD hypertension and seizures who comes the ED complaining of fatigue and hyperglycemia.  He was in his usual state of health, and ran out of some medications about a week ago including his insulin.  He uses [facility].  Over the last 2 to 3 days has had gradual onset of fatigue, polyuria, polydipsia.  No acute pain shortness of breath fever chills body aches sweats.  He sees pulmonology for  possible COPD.  Had negative Covid  Testing a month ago.     Past Medical History:  Diagnosis Date  . AICD (automatic cardioverter/defibrillator) present 2011  . CHF (congestive heart failure) ([place])   . Coronary artery disease   . Diabetes mellitus without complication ([place])   . ED (erectile dysfunction)   . GERD (gastroesophageal reflux disease)   . Hyperlipidemia   . Hypertension   . Ischemic cardiomyopathy   . LADA (latent autoimmune diabetes in adults), managed as type 1 ([place])   . Myocardial infarction [facility]) 2011   anterior MI at [place] s/p BMS to ostial/proximal LAD  . Neuromuscular disorder ([place])    problems in arms. finger tips have been numb/tingling. voltaren works  . Obstructive sleep apnea 2018   cpap. has not used lately. uses a breathe-right strip on nose  . Seizures ([place])    diabetes seizures (low blood sugar); last one 08/2018  . Systolic heart failure ([place])   . Vitamin D deficiency      Patient Active Problem List   Diagnosis Date Noted  . 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 Elevated PSA 07/25/2017  . Erectile dysfunction associated with type 2 diabetes mellitus ([place]) 07/25/2017  . Acute on chronic systolic heart  failure ([place]) 11/24/2016  . Shortness of breath   . Abnormal EKG   . DKA (diabetic ketoacidoses) ([place]) 04/18/2016  . CAP (community acquired pneumonia) 04/18/2016  . Accelerated hypertension 04/18/2016  . Abdominal pain 04/18/2016     Past Surgical History:  Procedure Laterality Date  . CARDIAC DEFIBRILLATOR PLACEMENT  08/30/2010  . CHOLECYSTECTOMY N/A 11/07/2017   Procedure: LAPAROSCOPIC CHOLECYSTECTOMY;  Surgeon: Herbert Pun, MD;  Location: [facility] ORS;  Service: General;  Laterality: N/A;  . CORONARY ANGIOPLASTY  2011   stent placed x 1  . ESOPHAGOGASTRODUODENOSCOPY (EGD) WITH PROPOFOL N/A 03/20/2018   Procedure: ESOPHAGOGASTRODUODENOSCOPY (EGD) WITH PROPOFOL;  Surgeon: Manya Silvas, MD;  Location: [facility] ENDOSCOPY;  Service: Endoscopy;  Laterality: N/A;  . LEFT HEART CATH AND CORONARY ANGIOGRAPHY N/A 11/23/2016   Procedure: Left Heart Cath and Coronary Angiography;  Surgeon: Nelva Bush, MD;  Location: [place] CV LAB;  Service: Cardiovascular;  Laterality: N/A;  . PROSTATE BIOPSY  2019  . PROSTATE BIOPSY N/A 10/23/2019   Procedure: PROSTATE BIOPSY;  Surgeon: Abbie Sons, MD;  Location: [facility] ORS;  Service: Urology;  Laterality: N/A;  . TRANSRECTAL ULTRASOUND N/A 10/23/2019   Procedure: TRANSRECTAL ULTRASOUND;  Surgeon: Abbie Sons, MD;  Location: [facility] ORS;  Service: Urology;  Laterality: N/A;     Prior to Admission medications   Medication Sig Start Date End Date Taking? Authorizing Provider  azithromycin (ZITHROMAX) 250 MG tablet Take 1 tablet (250 mg total) by mouth daily for  5 days. 12/01/19 12/06/19 Yes Carrie Mew, MD  budesonide (PULMICORT) 1 MG/2ML nebulizer solution Inhale 1 mg into the lungs daily. 11/27/19 11/26/20 Yes [provider]  Insulin Glargine, 2 Unit Dial, (TOUJEO MAX SOLOSTAR) 300 UNIT/ML SOPN Inject 44 Units into the skin daily. 12/01/19  Yes Carrie Mew, MD  predniSONE (DELTASONE) 20 MG tablet Take 1 tablet (20 mg  total) by mouth daily for 7 days. 12/01/19 12/08/19 Yes Carrie Mew, MD  albuterol (PROVENTIL HFA) 108 (90 Base) MCG/ACT inhaler Inhale 2 puffs into the lungs every 4 (four) hours as needed for wheezing or shortness of breath. 10/08/18   Carrie Mew, MD  aspirin EC 81 MG tablet Take 81 mg by mouth every morning.    [provider]  atorvastatin (LIPITOR) 80 MG tablet Take 80 mg by mouth at bedtime.  02/09/16   [provider]  benzonatate (TESSALON PERLES) 100 MG capsule Take 2 capsules (200 mg total) by mouth 3 (three) times daily as needed. Patient not taking: Reported on 10/23/2019 09/12/19 09/11/20  Sable Feil, PA-C  carvedilol (COREG) 25 MG tablet Take 1 tablet (25 mg total) by mouth 2 (two) times daily. 12/01/19   Carrie Mew, MD  Cholecalciferol (D3-1000) 1000 units capsule Take 1,000 Units by mouth daily.    [provider]  Continuous Blood Gluc Receiver (FREESTYLE LIBRE 14 DAY READER) DEVI Use 1 Device continuously as needed 02/07/18   [provider]  Continuous Blood Gluc Sensor (FREESTYLE LIBRE 14 DAY SENSOR) MISC Use 1 Device every 14 (fourteen) days 02/07/18   [provider]  digoxin (LANOXIN) 0.125 MG tablet Take 1 tablet (125 mcg total) by mouth daily. 12/01/19   Carrie Mew, MD  diphenoxylate-atropine (LOMOTIL) 2.5-0.025 MG tablet  06/01/18   [provider]  fexofenadine-pseudoephedrine (ALLEGRA-D) 60-120 MG 12 hr tablet Take 1 tablet by mouth 2 (two) times daily. 09/12/19   Sable Feil, PA-C  fluticasone (FLONASE) 50 MCG/ACT nasal spray Place 2 sprays into both nostrils daily as needed for allergies.  01/26/16   [provider]  furosemide (LASIX) 20 MG tablet Take 1 tablet (20 mg total) by mouth 2 (two) times daily. 12/01/19   Carrie Mew, MD  glucagon, human recombinant, [facility] DIAGNOSTIC) 1 MG injection Inject into the muscle. 02/07/18   [provider]  HUMALOG KWIKPEN 100  UNIT/ML KwikPen Inject 0.18-0.22 mLs (18-22 Units total) into the skin See admin instructions. Inject 18 units subcutaneous in the morning, inject 20 units subcutaneous at lunchtime, and inject 22 units subcutaneous at night. Plus sliding scale as directed. 12/01/19   Carrie Mew, MD  Insulin Pen Needle (B-D ULTRAFINE III SHORT PEN) 31G X 8 MM MISC USE 5 TIMES DAILY AS  DIRECTED 12/01/19   Carrie Mew, MD  loratadine (CLARITIN) 10 MG tablet Take 10 mg by mouth daily.    [provider]  losartan (COZAAR) 100 MG tablet Take 1 tablet (100 mg total) by mouth at bedtime. 12/01/19   Carrie Mew, MD  methylPREDNISolone (MEDROL DOSEPAK) 4 MG TBPK tablet Take Tapered dose as directed Patient not taking: Reported on 10/15/2019 09/12/19   Sable Feil, PA-C  metoCLOPramide (REGLAN) 10 MG tablet Take 10 mg by mouth 4 (four) times daily.    [provider]  pantoprazole (PROTONIX) 40 MG tablet Take 1 tablet (40 mg total) by mouth daily. Patient not taking: Reported on 10/15/2019 04/21/16   [name], MD  potassium chloride (KLOR-CON M10) 10 MEQ tablet TAKE ONE  TABLET BY MOUTH EVERY DAY 07/28/12   [provider]  spironolactone (ALDACTONE) 25 MG tablet Take 0.5 tablets (12.5 mg total) by mouth daily. 12/01/19   Carrie Mew, MD     Allergies Vancomycin and Lisinopril   Family History  Problem Relation Age of Onset  . Hypertension Mother   . Heart attack Paternal Grandmother   . Heart attack Father   . Bladder Cancer Neg Hx   . Kidney cancer Neg Hx   . Prostate cancer Neg Hx     Social History Social History   Tobacco Use  . Smoking status: Never Smoker  . Smokeless tobacco: Never Used  Substance Use Topics  . Alcohol use: No  . Drug use: No    Review of Systems  Constitutional:   No fever or chills.  ENT:   No sore throat. No rhinorrhea. Cardiovascular:   No chest pain or syncope. Respiratory:   No dyspnea or  cough. Gastrointestinal:   Negative for abdominal pain, vomiting and diarrhea.  Musculoskeletal:   Negative for focal pain or swelling All other systems reviewed and are negative except as documented above in ROS and HPI.  ____________________________________________   PHYSICAL EXAM:  VITAL SIGNS: ED Triage Vitals  Enc Vitals Group     BP 12/01/19 0658 (!) 144/85     Pulse Rate 12/01/19 0658 84     Resp 12/01/19 0658 17     Temp 12/01/19 0658 98.5 F (36.9 C)     Temp Source 12/01/19 0658 Oral     SpO2 12/01/19 0658 98 %     Weight 12/01/19 0656 210 lb (95.3 kg)     Height 12/01/19 0656 6\' 1"  (1.854 m)     Head Circumference --      Peak Flow --      Pain Score 12/01/19 0655 7     Pain Loc --      Pain Edu? --      Excl. in [place]? --     Vital signs reviewed, nursing assessments reviewed.   Constitutional:   Alert and oriented. Non-toxic appearance. Eyes:   Conjunctivae are normal. EOMI. PERRL. ENT      Head:   Normocephalic and atraumatic.      Nose:   Wearing a mask.      Mouth/Throat:   Wearing a mask.      Neck:   No meningismus. Full ROM. Hematological/Lymphatic/Immunilogical:   No cervical lymphadenopathy. Cardiovascular:   RRR. Symmetric bilateral radial and DP pulses.  No murmurs. Cap refill less than 2 seconds. Respiratory:   Normal respiratory effort without tachypnea/retractions. Breath sounds are clear and equal bilaterally. No wheezes/rales/rhonchi. Gastrointestinal:   Soft and nontender. Non distended. There is no CVA tenderness.  No rebound, rigidity, or guarding. Musculoskeletal:   Normal range of motion in all extremities. No joint effusions.  No lower extremity tenderness.  No edema. Neurologic:   Normal speech and language.  Motor grossly intact. No acute focal neurologic deficits are appreciated.  Skin:    Skin is warm, dry and intact. No rash noted.  No petechiae, purpura, or bullae.  ____________________________________________    LABS  (pertinent positives/negatives) (all labs ordered are listed, but only abnormal results are displayed) Labs Reviewed  BASIC METABOLIC PANEL - Abnormal; Notable for the following components:      Result Value   Sodium 133 (*)    Chloride 97 (*)    Glucose, Bld 652 (*)    Calcium 8.5 (*)  All other components within normal limits  URINALYSIS, COMPLETE (UACMP) WITH MICROSCOPIC - Abnormal; Notable for the following components:   Color, Urine STRAW (*)    APPearance CLEAR (*)    Glucose, UA >=500 (*)    Hgb urine dipstick SMALL (*)    All other components within normal limits  GLUCOSE, CAPILLARY - Abnormal; Notable for the following components:   Glucose-Capillary 523 (*)    All other components within normal limits  GLUCOSE, CAPILLARY - Abnormal; Notable for the following components:   Glucose-Capillary 347 (*)    All other components within normal limits  GLUCOSE, CAPILLARY - Abnormal; Notable for the following components:   Glucose-Capillary 289 (*)    All other components within normal limits  CBC  CBG MONITORING, ED  CBG MONITORING, ED   ____________________________________________   EKG    ____________________________________________    RADIOLOGY  No results found.  ____________________________________________   PROCEDURES Procedures  ____________________________________________    CLINICAL IMPRESSION / ASSESSMENT AND PLAN / ED COURSE  Medications ordered in the ED: Medications  sodium chloride 0.9 % bolus 1,000 mL (0 mLs Intravenous Stopped 12/01/19 0757)  insulin aspart (novoLOG) injection 10 Units (10 Units Intravenous Given 12/01/19 0847)  insulin glargine (LANTUS) injection 30 Units (30 Units Subcutaneous Given 12/01/19 0848)  ondansetron (ZOFRAN) injection 4 mg (4 mg Intravenous Given 12/01/19 0931)  sodium chloride 0.9 % bolus 1,000 mL (1,000 mLs Intravenous New Bag/Given 12/01/19 1034)    Pertinent labs & imaging results that were available during  my care of the patient were reviewed by me and considered in my medical decision making (see chart for details).  VERNIS BIAGIONI was evaluated in Emergency Department on 12/01/2019 for the symptoms described in the history of present illness. He was evaluated in the context of the global COVID-19 pandemic, which necessitated consideration that the patient might be at risk for infection with the SARS-CoV-2 virus that causes COVID-19. Institutional protocols and algorithms that pertain to the evaluation of patients at risk for COVID-19 are in a state of rapid change based on information released by regulatory bodies including the CDC and federal and state organizations. These policies and algorithms were followed during the patient's care in the ED.   Patient presents with hyperglycemia.  Labs show no evidence of acidosis.  I will follow up on his urinalysis.  IV fluids, IV insulin to improve glycemic control.  His prescription delivery may have been delayed due to [place] large winter [place] the middle [place], preventing transport of his medications from [place].  I will provide written prescriptions for many of his medications which are critical to continue including his insulins, Lasix, spironolactone, carvedilol, digoxin.  Vital signs are normal in the ED.   ----------------------------------------- 12:11 PM on 12/01/2019 -----------------------------------------  Vital signs remain normal.  Labs unremarkable, hyperglycemia resolved after insulin and some fluids.  Stable for discharge home.     ____________________________________________   FINAL CLINICAL IMPRESSION(S) / ED DIAGNOSES    Final diagnoses:  Type 2 diabetes mellitus with hyperglycemia, with long-term current use of insulin (HCC)  Chronic systolic heart failure [facility])     ED Discharge Orders         Ordered    azithromycin (ZITHROMAX) 250 MG tablet  Daily     12/01/19 0809    carvedilol (COREG) 25 MG tablet  2  times daily     12/01/19 0809    digoxin (LANOXIN) 0.125 MG tablet  Daily  12/01/19 0809    furosemide (LASIX) 20 MG tablet  2 times daily     12/01/19 0809    HUMALOG KWIKPEN 100 UNIT/ML KwikPen  See admin instructions     12/01/19 0809    Insulin Glargine, 2 Unit Dial, (TOUJEO MAX SOLOSTAR) 300 UNIT/ML SOPN  Daily     12/01/19 0809    Insulin Pen Needle (B-D ULTRAFINE III SHORT PEN) 31G X 8 MM MISC     12/01/19 0809    losartan (COZAAR) 100 MG tablet  Daily at bedtime     12/01/19 0809    predniSONE (DELTASONE) 20 MG tablet  Daily     12/01/19 0809    spironolactone (ALDACTONE) 25 MG tablet  Daily     12/01/19 0809          Portions of this note were generated with dragon dictation software. Dictation errors may occur despite best attempts at proofreading.   Carrie Mew, MD 12/01/19 [phone]

## 2019-12-04 ENCOUNTER — Encounter: Payer: Self-pay | Admitting: Radiation Oncology

## 2019-12-04 ENCOUNTER — Encounter: Payer: Self-pay | Admitting: Urology

## 2019-12-04 ENCOUNTER — Encounter: Payer: Self-pay | Admitting: *Deleted

## 2019-12-04 ENCOUNTER — Other Ambulatory Visit: Payer: Self-pay | Admitting: Radiology

## 2019-12-04 ENCOUNTER — Ambulatory Visit
Admission: RE | Admit: 2019-12-04 | Discharge: 2019-12-04 | Disposition: A | Discharge status: Home / Self Care | Payer: PRIVATE HEALTH INSURANCE | Source: Ambulatory Visit | Attending: Radiation Oncology | Admitting: Radiation Oncology

## 2019-12-04 ENCOUNTER — Ambulatory Visit (INDEPENDENT_AMBULATORY_CARE_PROVIDER_SITE_OTHER): Payer: PRIVATE HEALTH INSURANCE | Admitting: Urology

## 2019-12-04 ENCOUNTER — Encounter: Payer: Self-pay | Admitting: Radiology

## 2019-12-04 ENCOUNTER — Other Ambulatory Visit: Payer: Self-pay

## 2019-12-04 VITALS — BP 161/94 | HR 98 | Ht 73.0 in | Wt 210.0 lb

## 2019-12-04 VITALS — BP 156/98 | HR 78 | Resp 16 | Wt 207.2 lb

## 2019-12-04 DIAGNOSIS — I255 Ischemic cardiomyopathy: Secondary | ICD-10-CM | POA: Insufficient documentation

## 2019-12-04 DIAGNOSIS — I252 Old myocardial infarction: Secondary | ICD-10-CM | POA: Diagnosis not present

## 2019-12-04 DIAGNOSIS — Z95810 Presence of automatic (implantable) cardiac defibrillator: Secondary | ICD-10-CM | POA: Diagnosis not present

## 2019-12-04 DIAGNOSIS — I2510 Atherosclerotic heart disease of native coronary artery without angina pectoris: Secondary | ICD-10-CM | POA: Insufficient documentation

## 2019-12-04 DIAGNOSIS — Z79899 Other long term (current) drug therapy: Secondary | ICD-10-CM | POA: Diagnosis not present

## 2019-12-04 DIAGNOSIS — C61 Malignant neoplasm of prostate: Secondary | ICD-10-CM

## 2019-12-04 DIAGNOSIS — I509 Heart failure, unspecified: Secondary | ICD-10-CM | POA: Diagnosis not present

## 2019-12-04 DIAGNOSIS — G4733 Obstructive sleep apnea (adult) (pediatric): Secondary | ICD-10-CM | POA: Diagnosis not present

## 2019-12-04 DIAGNOSIS — Z794 Long term (current) use of insulin: Secondary | ICD-10-CM | POA: Diagnosis not present

## 2019-12-04 DIAGNOSIS — N5203 Combined arterial insufficiency and corporo-venous occlusive erectile dysfunction: Secondary | ICD-10-CM

## 2019-12-04 DIAGNOSIS — N400 Enlarged prostate without lower urinary tract symptoms: Secondary | ICD-10-CM | POA: Insufficient documentation

## 2019-12-04 DIAGNOSIS — K219 Gastro-esophageal reflux disease without esophagitis: Secondary | ICD-10-CM | POA: Insufficient documentation

## 2019-12-04 DIAGNOSIS — E559 Vitamin D deficiency, unspecified: Secondary | ICD-10-CM | POA: Insufficient documentation

## 2019-12-04 DIAGNOSIS — E119 Type 2 diabetes mellitus without complications: Secondary | ICD-10-CM | POA: Diagnosis not present

## 2019-12-04 DIAGNOSIS — Z9049 Acquired absence of other specified parts of digestive tract: Secondary | ICD-10-CM | POA: Insufficient documentation

## 2019-12-04 DIAGNOSIS — I110 Hypertensive heart disease with heart failure: Secondary | ICD-10-CM | POA: Insufficient documentation

## 2019-12-04 DIAGNOSIS — E785 Hyperlipidemia, unspecified: Secondary | ICD-10-CM | POA: Diagnosis not present

## 2019-12-04 DIAGNOSIS — Z7982 Long term (current) use of aspirin: Secondary | ICD-10-CM | POA: Diagnosis not present

## 2019-12-04 NOTE — Progress Notes (Signed)
12/04/2019 6:59 PM   Joseph [name] 17-Jul-1971 [id number]  Referring provider: Center, [facility] [place],  [place] [address]  Chief Complaint  Patient presents with  . 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  Rad onc consult today pending with Dr. Baruch Gouty.  Please [name] Dr. Dagoberto Reef notes for details. PSA of 9.5 and 4K with an 8% probability of Gleason 7 or greater prostate cancer.  The pathology result was discussed in detail.  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  3/12+ for Gleason 3+3 and 2/12+ for Gleason 3+4.  + baseline ED, moderate  + lap cholecystectomy  Extensive PMH as below.     PMH: Past Medical History:  Diagnosis Date  . AICD (automatic cardioverter/defibrillator) present 2011  . CHF (congestive heart failure) ([place])   . Coronary artery disease   . Diabetes mellitus without complication ([place])   . ED (erectile dysfunction)   . GERD (gastroesophageal reflux disease)   . Hyperlipidemia   . Hypertension   . Ischemic cardiomyopathy   . LADA (latent autoimmune diabetes in adults), managed as type 1 ([place])   . Myocardial infarction [facility]) 2011   anterior MI at [facility] s/p BMS to ostial/proximal LAD  . Neuromuscular disorder ([place])    problems in arms. finger tips have been numb/tingling. voltaren works  . Obstructive sleep apnea 2018   cpap. has not used lately. uses a breathe-right strip on nose  . Seizures ([place])    diabetes seizures (low blood sugar); last one 08/2018  . Systolic heart failure ([place])   . Vitamin D deficiency     Surgical History: Past Surgical History:  Procedure Laterality Date  . CARDIAC DEFIBRILLATOR PLACEMENT  08/30/2010  . CHOLECYSTECTOMY N/A 11/07/2017   Procedure: LAPAROSCOPIC CHOLECYSTECTOMY;  Surgeon: Herbert Pun, MD;  Location: [facility] ORS;  Service: General;  Laterality: N/A;   . CORONARY ANGIOPLASTY  2011   stent placed x 1  . ESOPHAGOGASTRODUODENOSCOPY (EGD) WITH PROPOFOL N/A 03/20/2018   Procedure: ESOPHAGOGASTRODUODENOSCOPY (EGD) WITH PROPOFOL;  Surgeon: Manya Silvas, MD;  Location: [facility] ENDOSCOPY;  Service: Endoscopy;  Laterality: N/A;  . LEFT HEART CATH AND CORONARY ANGIOGRAPHY N/A 11/23/2016   Procedure: Left Heart Cath and Coronary Angiography;  Surgeon: Nelva Bush, MD;  Location: [place] CV LAB;  Service: Cardiovascular;  Laterality: N/A;  . PROSTATE BIOPSY  2019  . PROSTATE BIOPSY N/A 10/23/2019   Procedure: PROSTATE BIOPSY;  Surgeon: Abbie Sons, MD;  Location: [facility] ORS;  Service: Urology;  Laterality: N/A;  . TRANSRECTAL ULTRASOUND N/A 10/23/2019   Procedure: TRANSRECTAL ULTRASOUND;  Surgeon: Abbie Sons, MD;  Location: [facility] ORS;  Service: Urology;  Laterality: N/A;    Home Medications:  Allergies as of 12/04/2019      Reactions   Vancomycin Shortness Of Breath   Lisinopril Rash      Medication List       Accurate as of December 04, 2019  6:59 PM. If you have any questions, ask your nurse or doctor.        STOP taking these medications   atorvastatin 80 MG tablet Commonly known as: LIPITOR Stopped by: Hollice Espy, MD   azithromycin 250 MG tablet Commonly known as: ZITHROMAX Stopped by: Hollice Espy, MD   benzonatate 100 MG capsule Commonly known as: Best boy Stopped by: Hollice Espy, MD   diphenoxylate-atropine  2.5-0.025 MG tablet Commonly known as: LOMOTIL Stopped by: Hollice Espy, MD   Klor-Con M10 10 MEQ tablet Generic drug: potassium chloride Stopped by: Hollice Espy, MD   methylPREDNISolone 4 MG Tbpk tablet Commonly known as: MEDROL DOSEPAK Stopped by: Hollice Espy, MD   metoCLOPramide 10 MG tablet Commonly known as: REGLAN Stopped by: Hollice Espy, MD   predniSONE 20 MG tablet Commonly known as: DELTASONE Stopped by: Hollice Espy, MD     TAKE these medications    albuterol 108 (90 Base) MCG/ACT inhaler Commonly known as: Proventil HFA Inhale 2 puffs into the lungs every 4 (four) hours as needed for wheezing or shortness of breath.   aspirin EC 81 MG tablet Take 81 mg by mouth every morning.   B-D ULTRAFINE III SHORT PEN 31G X 8 MM Misc Generic drug: Insulin Pen Needle USE 5 TIMES DAILY AS  DIRECTED   budesonide 1 MG/2ML nebulizer solution Commonly known as: PULMICORT Inhale 1 mg into the lungs daily.   carvedilol 25 MG tablet Commonly known as: COREG Take 1 tablet (25 mg total) by mouth 2 (two) times daily.   D3-1000 25 MCG (1000 UT) capsule Generic drug: Cholecalciferol Take 1,000 Units by mouth daily.   digoxin 0.125 MG tablet Commonly known as: LANOXIN Take 1 tablet (125 mcg total) by mouth daily.   fexofenadine-pseudoephedrine 60-120 MG 12 hr tablet Commonly known as: ALLEGRA-D Take 1 tablet by mouth 2 (two) times daily.   fluticasone 50 MCG/ACT nasal spray Commonly known as: FLONASE Place 2 sprays into both nostrils daily as needed for allergies.   FreeStyle Libre 14 Day Reader Kerrin Mo Use 1 Device continuously as needed   [employer] 14 Day Sensor Misc Use 1 Device every 14 (fourteen) days   furosemide 20 MG tablet Commonly known as: LASIX Take 1 tablet (20 mg total) by mouth 2 (two) times daily.   GlucaGen Diagnostic 1 MG injection Generic drug: glucagon (human recombinant) Inject into the muscle.   glucagon 1 MG injection Inject into the muscle.   HumaLOG KwikPen 100 UNIT/ML KwikPen Generic drug: insulin lispro Inject 0.18-0.22 mLs (18-22 Units total) into the skin [name] admin instructions. Inject 18 units subcutaneous in the morning, inject 20 units subcutaneous at lunchtime, and inject 22 units subcutaneous at night. Plus sliding scale as directed.   loratadine 10 MG tablet Commonly known as: CLARITIN Take 10 mg by mouth daily.   losartan 100 MG tablet Commonly known as: COZAAR Take 1 tablet (100 mg  total) by mouth at bedtime.   montelukast 10 MG tablet Commonly known as: SINGULAIR Take by mouth.   pantoprazole 40 MG tablet Commonly known as: PROTONIX Take 1 tablet (40 mg total) by mouth daily.   spironolactone 25 MG tablet Commonly known as: ALDACTONE Take 0.5 tablets (12.5 mg total) by mouth daily.   Toujeo Max SoloStar 300 UNIT/ML Sopn Generic drug: Insulin Glargine (2 Unit Dial) Inject 44 Units into the skin daily.       Allergies:  Allergies  Allergen Reactions  . Vancomycin Shortness Of Breath  . Lisinopril Rash    Family History: Family History  Problem Relation Age of Onset  . Hypertension Mother   . Heart attack Paternal Grandmother   . Heart attack Father   . Bladder Cancer Neg Hx   . Kidney cancer Neg Hx   . Prostate cancer Neg Hx     Social History:  reports that he has never smoked. He has never used smokeless tobacco. He reports  that he does not drink alcohol or use drugs.   Physical Exam: BP (!) 161/94   Pulse 98   Ht 6\' 1"  (1.854 m)   Wt 210 lb (95.3 kg)   BMI 27.71 kg/m   Constitutional:  Alert and oriented, No acute distress. HEENT: [place] AT, moist mucus membranes.  Trachea midline, no masses. Cardiovascular: No clubbing, cyanosis, or edema. Respiratory: Normal respiratory effort, no increased work of breathing. GI: Abdomen is soft, nontender, nondistended, no abdominal masses, lap incision as umbilicus, and RUQ. Skin: No rashes, bruises or suspicious lesions. Neurologic: Grossly intact, no focal deficits, moving all 4 extremities. Psychiatric: Normal mood and affect.  Laboratory Data: Lab Results  Component Value Date   WBC 7.5 12/01/2019   HGB 15.4 12/01/2019   HCT 45.8 12/01/2019   MCV 90.3 12/01/2019   PLT 169 12/01/2019    Lab Results  Component Value Date   CREATININE 1.24 12/01/2019    PSA as above  Lab Results  Component Value Date   HGBA1C 11.2 (H) 11/24/2016     Assessment & Plan:    1. Prostate cancer  [facility]) The patient was counseled about the natural history of prostate cancer and the standard treatment options that are available for prostate cancer. It was explained to him how his age and life expectancy, clinical stage, Gleason score, and PSA affect his prognosis, the decision to proceed with additional staging studies, as well as how that information influences recommended treatment strategies. We discussed the roles for active surveillance, radiation therapy, surgical therapy, androgen deprivation, as well as ablative therapy options for the treatment of prostate cancer as appropriate to his individual cancer situation. We discussed the risks and benefits of these options with regard to their impact on cancer control and also in terms of potential adverse events, complications, and impact on quality of life particularly related to urinary, bowel, and sexual function. The patient was encouraged to ask questions throughout the discussion today and all questions were answered to his stated satisfaction. In addition, the patient was provided with and/or directed to appropriate resources and literature for further education about prostate cancer treatment options.  We discussed surgical therapy for prostate cancer including the different available surgical approaches.  Specifically, we discussed robotic prostatectomy with possible pelvic lymph node dissection based on risk stratification.  We discussed, in detail, the risks and expectations of surgery with regard to cancer control, urinary control, and erectile dysfunction as well as expected post operative recovery processed. Additional risks of surgery including but not limited to bleeding, infection, hernia formation, nerve damage, fistula formation, bowel/rectal injury, potentially necessitating colostomy, damage to the urinary tract resulting in urinary leakage, urethral stricture, and cardiopulmonary risk such as myocardial infarction, stroke, death,  thromboembolism etc. were explained.     He will need cardiac clearance.  Rad onc consult today  Likely electing surgery, will refer to PT   - Urinalysis, Complete - CULTURE, URINE COMPREHENSIVE  2. Combined arterial insufficiency and corporo-venous occlusive erectile dysfunction [facility] ED, discussed options for penile rehab    Hollice Espy, MD  [facility] [address] [place], [place] [address] [phone]  I spent 50 total minutes on the day of the encounter including pre-visit review of the medical record, face-to-face time with the patient, and post visit ordering of labs/imaging/tests.  Case discussed with Dr. Bernardo Heater.

## 2019-12-04 NOTE — H&P (View-Only) (Signed)
12/04/2019 6:59 PM   Joseph [name] 06-03-1971 [id number]  Referring provider: Center, [facility] [place],  [place] [address]  Chief Complaint  Patient presents with  . 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  Rad onc consult today pending with Dr. Baruch Gouty.  Please [name] Dr. Dagoberto Reef notes for details. PSA of 9.5 and 4K with an 8% probability of Gleason 7 or greater prostate cancer.  The pathology result was discussed in detail.  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  3/12+ for Gleason 3+3 and 2/12+ for Gleason 3+4.  + baseline ED, moderate  + lap cholecystectomy  Extensive PMH as below.     PMH: Past Medical History:  Diagnosis Date  . AICD (automatic cardioverter/defibrillator) present 2011  . CHF (congestive heart failure) ([place])   . Coronary artery disease   . Diabetes mellitus without complication ([place])   . ED (erectile dysfunction)   . GERD (gastroesophageal reflux disease)   . Hyperlipidemia   . Hypertension   . Ischemic cardiomyopathy   . LADA (latent autoimmune diabetes in adults), managed as type 1 ([place])   . Myocardial infarction [facility]) 2011   anterior MI at [place] s/p BMS to ostial/proximal LAD  . Neuromuscular disorder ([place])    problems in arms. finger tips have been numb/tingling. voltaren works  . Obstructive sleep apnea 2018   cpap. has not used lately. uses a breathe-right strip on nose  . Seizures ([place])    diabetes seizures (low blood sugar); last one 08/2018  . Systolic heart failure ([place])   . Vitamin D deficiency     Surgical History: Past Surgical History:  Procedure Laterality Date  . CARDIAC DEFIBRILLATOR PLACEMENT  08/30/2010  . CHOLECYSTECTOMY N/A 11/07/2017   Procedure: LAPAROSCOPIC CHOLECYSTECTOMY;  Surgeon: Herbert Pun, MD;  Location: [facility] ORS;  Service: General;  Laterality: N/A;   . CORONARY ANGIOPLASTY  2011   stent placed x 1  . ESOPHAGOGASTRODUODENOSCOPY (EGD) WITH PROPOFOL N/A 03/20/2018   Procedure: ESOPHAGOGASTRODUODENOSCOPY (EGD) WITH PROPOFOL;  Surgeon: Manya Silvas, MD;  Location: [facility] ENDOSCOPY;  Service: Endoscopy;  Laterality: N/A;  . LEFT HEART CATH AND CORONARY ANGIOGRAPHY N/A 11/23/2016   Procedure: Left Heart Cath and Coronary Angiography;  Surgeon: Nelva Bush, MD;  Location: [place] CV LAB;  Service: Cardiovascular;  Laterality: N/A;  . PROSTATE BIOPSY  2019  . PROSTATE BIOPSY N/A 10/23/2019   Procedure: PROSTATE BIOPSY;  Surgeon: Abbie Sons, MD;  Location: [facility] ORS;  Service: Urology;  Laterality: N/A;  . TRANSRECTAL ULTRASOUND N/A 10/23/2019   Procedure: TRANSRECTAL ULTRASOUND;  Surgeon: Abbie Sons, MD;  Location: [facility] ORS;  Service: Urology;  Laterality: N/A;    Home Medications:  Allergies as of 12/04/2019      Reactions   Vancomycin Shortness Of Breath   Lisinopril Rash      Medication List       Accurate as of December 04, 2019  6:59 PM. If you have any questions, ask your nurse or doctor.        STOP taking these medications   atorvastatin 80 MG tablet Commonly known as: LIPITOR Stopped by: Hollice Espy, MD   azithromycin 250 MG tablet Commonly known as: ZITHROMAX Stopped by: Hollice Espy, MD   benzonatate 100 MG capsule Commonly known as: Best boy Stopped by: Hollice Espy, MD   diphenoxylate-atropine  2.5-0.025 MG tablet Commonly known as: LOMOTIL Stopped by: Hollice Espy, MD   Klor-Con M10 10 MEQ tablet Generic drug: potassium chloride Stopped by: Hollice Espy, MD   methylPREDNISolone 4 MG Tbpk tablet Commonly known as: MEDROL DOSEPAK Stopped by: Hollice Espy, MD   metoCLOPramide 10 MG tablet Commonly known as: REGLAN Stopped by: Hollice Espy, MD   predniSONE 20 MG tablet Commonly known as: DELTASONE Stopped by: Hollice Espy, MD     TAKE these medications    albuterol 108 (90 Base) MCG/ACT inhaler Commonly known as: Proventil HFA Inhale 2 puffs into the lungs every 4 (four) hours as needed for wheezing or shortness of breath.   aspirin EC 81 MG tablet Take 81 mg by mouth every morning.   B-D ULTRAFINE III SHORT PEN 31G X 8 MM Misc Generic drug: Insulin Pen Needle USE 5 TIMES DAILY AS  DIRECTED   budesonide 1 MG/2ML nebulizer solution Commonly known as: PULMICORT Inhale 1 mg into the lungs daily.   carvedilol 25 MG tablet Commonly known as: COREG Take 1 tablet (25 mg total) by mouth 2 (two) times daily.   D3-1000 25 MCG (1000 UT) capsule Generic drug: Cholecalciferol Take 1,000 Units by mouth daily.   digoxin 0.125 MG tablet Commonly known as: LANOXIN Take 1 tablet (125 mcg total) by mouth daily.   fexofenadine-pseudoephedrine 60-120 MG 12 hr tablet Commonly known as: ALLEGRA-D Take 1 tablet by mouth 2 (two) times daily.   fluticasone 50 MCG/ACT nasal spray Commonly known as: FLONASE Place 2 sprays into both nostrils daily as needed for allergies.   FreeStyle Libre 14 Day Reader Kerrin Mo Use 1 Device continuously as needed   [employer] 14 Day Sensor Misc Use 1 Device every 14 (fourteen) days   furosemide 20 MG tablet Commonly known as: LASIX Take 1 tablet (20 mg total) by mouth 2 (two) times daily.   GlucaGen Diagnostic 1 MG injection Generic drug: glucagon (human recombinant) Inject into the muscle.   glucagon 1 MG injection Inject into the muscle.   HumaLOG KwikPen 100 UNIT/ML KwikPen Generic drug: insulin lispro Inject 0.18-0.22 mLs (18-22 Units total) into the skin [name] admin instructions. Inject 18 units subcutaneous in the morning, inject 20 units subcutaneous at lunchtime, and inject 22 units subcutaneous at night. Plus sliding scale as directed.   loratadine 10 MG tablet Commonly known as: CLARITIN Take 10 mg by mouth daily.   losartan 100 MG tablet Commonly known as: COZAAR Take 1 tablet (100 mg  total) by mouth at bedtime.   montelukast 10 MG tablet Commonly known as: SINGULAIR Take by mouth.   pantoprazole 40 MG tablet Commonly known as: PROTONIX Take 1 tablet (40 mg total) by mouth daily.   spironolactone 25 MG tablet Commonly known as: ALDACTONE Take 0.5 tablets (12.5 mg total) by mouth daily.   Toujeo Max SoloStar 300 UNIT/ML Sopn Generic drug: Insulin Glargine (2 Unit Dial) Inject 44 Units into the skin daily.       Allergies:  Allergies  Allergen Reactions  . Vancomycin Shortness Of Breath  . Lisinopril Rash    Family History: Family History  Problem Relation Age of Onset  . Hypertension Mother   . Heart attack Paternal Grandmother   . Heart attack Father   . Bladder Cancer Neg Hx   . Kidney cancer Neg Hx   . Prostate cancer Neg Hx     Social History:  reports that he has never smoked. He has never used smokeless tobacco. He reports  that he does not drink alcohol or use drugs.   Physical Exam: BP (!) 161/94   Pulse 98   Ht 6\' 1"  (1.854 m)   Wt 210 lb (95.3 kg)   BMI 27.71 kg/m   Constitutional:  Alert and oriented, No acute distress. HEENT: [place] AT, moist mucus membranes.  Trachea midline, no masses. Cardiovascular: No clubbing, cyanosis, or edema. Respiratory: Normal respiratory effort, no increased work of breathing. GI: Abdomen is soft, nontender, nondistended, no abdominal masses, lap incision as umbilicus, and RUQ. Skin: No rashes, bruises or suspicious lesions. Neurologic: Grossly intact, no focal deficits, moving all 4 extremities. Psychiatric: Normal mood and affect.  Laboratory Data: Lab Results  Component Value Date   WBC 7.5 12/01/2019   HGB 15.4 12/01/2019   HCT 45.8 12/01/2019   MCV 90.3 12/01/2019   PLT 169 12/01/2019    Lab Results  Component Value Date   CREATININE 1.24 12/01/2019    PSA as above  Lab Results  Component Value Date   HGBA1C 11.2 (H) 11/24/2016     Assessment & Plan:    1. Prostate cancer  [facility]) The patient was counseled about the natural history of prostate cancer and the standard treatment options that are available for prostate cancer. It was explained to him how his age and life expectancy, clinical stage, Gleason score, and PSA affect his prognosis, the decision to proceed with additional staging studies, as well as how that information influences recommended treatment strategies. We discussed the roles for active surveillance, radiation therapy, surgical therapy, androgen deprivation, as well as ablative therapy options for the treatment of prostate cancer as appropriate to his individual cancer situation. We discussed the risks and benefits of these options with regard to their impact on cancer control and also in terms of potential adverse events, complications, and impact on quality of life particularly related to urinary, bowel, and sexual function. The patient was encouraged to ask questions throughout the discussion today and all questions were answered to his stated satisfaction. In addition, the patient was provided with and/or directed to appropriate resources and literature for further education about prostate cancer treatment options.  We discussed surgical therapy for prostate cancer including the different available surgical approaches.  Specifically, we discussed robotic prostatectomy with possible pelvic lymph node dissection based on risk stratification.  We discussed, in detail, the risks and expectations of surgery with regard to cancer control, urinary control, and erectile dysfunction as well as expected post operative recovery processed. Additional risks of surgery including but not limited to bleeding, infection, hernia formation, nerve damage, fistula formation, bowel/rectal injury, potentially necessitating colostomy, damage to the urinary tract resulting in urinary leakage, urethral stricture, and cardiopulmonary risk such as myocardial infarction, stroke, death,  thromboembolism etc. were explained.     He will need cardiac clearance.  Rad onc consult today  Likely electing surgery, will refer to PT   - Urinalysis, Complete - CULTURE, URINE COMPREHENSIVE  2. Combined arterial insufficiency and corporo-venous occlusive erectile dysfunction [facility] ED, discussed options for penile rehab    Hollice Espy, MD  [facility] [address] [place],  [address] [phone]  I spent 50 total minutes on the day of the encounter including pre-visit review of the medical record, face-to-face time with the patient, and post visit ordering of labs/imaging/tests.  Case discussed with Dr. Bernardo Heater.

## 2019-12-04 NOTE — Consult Note (Signed)
NEW PATIENT EVALUATION  Name: Joseph [name]  MRN: [id number]  Date:   12/04/2019     DOB: [birth year]   This 49 y.o. 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  REFERRING PHYSICIAN: [place], [place]:  Chief Complaint  Patient presents with  . Prostate Cancer    Initial consultation    DIAGNOSIS: The encounter diagnosis was Prostate cancer ([place]).   PREVIOUS INVESTIGATIONS:  CT scan of abdomen pelvis has been ordered Clinical notes reviewed Pathology report reviewed  HPI: Patient is a 49 year old male with multiple comorbidities including congestive heart failure, coronary artery disease, diabetes mellitus, ischemic cardiomyopathy prior myocardial infarction.  He presented with an elevated PSA of 6.6 underwent transrectal ultrasound-guided biopsy showing 5 out of 12 cores positive for adenocarcinoma.  He had 3+ for Gleason 6 (3+3) and 2+ for Gleason 7 (3+4).  He does have erectile dysfunction.  Specifically denies any increased lower urinary tract symptoms.  He is status post cholecystectomy.  Treatment options including robotic prostatectomy have been discussed by urology he is now seen for radiation oncology opinion.  PLANNED TREATMENT REGIMEN: I-125 interstitial implant versus robotic prostatectomy  PAST MEDICAL HISTORY:  has a past medical history of AICD (automatic cardioverter/defibrillator) present (2011), CHF (congestive heart failure) ([place]), Coronary artery disease, Diabetes mellitus without complication ([place]), ED (erectile dysfunction), GERD (gastroesophageal reflux disease), Hyperlipidemia, Hypertension, Ischemic cardiomyopathy, LADA (latent autoimmune diabetes in adults), managed as type 1 ([place]), Myocardial infarction ([place]) (2011), Neuromuscular disorder ([place]), Obstructive sleep apnea (2018), Seizures ([place]), Systolic heart failure ([place]), and  Vitamin D deficiency.    PAST SURGICAL HISTORY:  Past Surgical History:  Procedure Laterality Date  . CARDIAC DEFIBRILLATOR PLACEMENT  08/30/2010  . CHOLECYSTECTOMY N/A 11/07/2017   Procedure: LAPAROSCOPIC CHOLECYSTECTOMY;  Surgeon: Herbert Pun, MD;  Location: [facility] ORS;  Service: General;  Laterality: N/A;  . CORONARY ANGIOPLASTY  2011   stent placed x 1  . ESOPHAGOGASTRODUODENOSCOPY (EGD) WITH PROPOFOL N/A 03/20/2018   Procedure: ESOPHAGOGASTRODUODENOSCOPY (EGD) WITH PROPOFOL;  Surgeon: Manya Silvas, MD;  Location: [facility] ENDOSCOPY;  Service: Endoscopy;  Laterality: N/A;  . LEFT HEART CATH AND CORONARY ANGIOGRAPHY N/A 11/23/2016   Procedure: Left Heart Cath and Coronary Angiography;  Surgeon: Nelva Bush, MD;  Location: [place] CV LAB;  Service: Cardiovascular;  Laterality: N/A;  . PROSTATE BIOPSY  2019  . PROSTATE BIOPSY N/A 10/23/2019   Procedure: PROSTATE BIOPSY;  Surgeon: Abbie Sons, MD;  Location: [facility] ORS;  Service: Urology;  Laterality: N/A;  . TRANSRECTAL ULTRASOUND N/A 10/23/2019   Procedure: TRANSRECTAL ULTRASOUND;  Surgeon: Abbie Sons, MD;  Location: [facility] ORS;  Service: Urology;  Laterality: N/A;    FAMILY HISTORY: family history includes Heart attack in his father and paternal grandmother; Hypertension in his mother.  SOCIAL HISTORY:  reports that he has never smoked. He has never used smokeless tobacco. He reports that he does not drink alcohol or use drugs.  ALLERGIES: Vancomycin and Lisinopril  MEDICATIONS:  Current Outpatient Medications  Medication Sig Dispense Refill  . albuterol (PROVENTIL HFA) 108 (90 Base) MCG/ACT inhaler Inhale 2 puffs into the lungs every 4 (four) hours as needed for wheezing or shortness of breath. 1 Inhaler 0  . aspirin EC 81 MG tablet Take 81 mg by mouth every morning.    . budesonide (PULMICORT) 1 MG/2ML nebulizer solution Inhale 1 mg into the  lungs daily.    . carvedilol (COREG) 25 MG tablet Take 1 tablet (25  mg total) by mouth 2 (two) times daily. 14 tablet 0  . Cholecalciferol (D3-1000) 1000 units capsule Take 1,000 Units by mouth daily.    . Continuous Blood Gluc Receiver (FREESTYLE LIBRE 14 DAY READER) DEVI Use 1 Device continuously as needed    . Continuous Blood Gluc Sensor (FREESTYLE LIBRE 14 DAY SENSOR) MISC Use 1 Device every 14 (fourteen) days    . digoxin (LANOXIN) 0.125 MG tablet Take 1 tablet (125 mcg total) by mouth daily. 14 tablet 0  . fexofenadine-pseudoephedrine (ALLEGRA-D) 60-120 MG 12 hr tablet Take 1 tablet by mouth 2 (two) times daily. 20 tablet 0  . fluticasone (FLONASE) 50 MCG/ACT nasal spray Place 2 sprays into both nostrils daily as needed for allergies.     . furosemide (LASIX) 20 MG tablet Take 1 tablet (20 mg total) by mouth 2 (two) times daily. 30 tablet 0  . glucagon 1 MG injection Inject into the muscle.    Marland Kitchen glucagon, human recombinant, (GLUCAGEN DIAGNOSTIC) 1 MG injection Inject into the muscle.    Marland Kitchen HUMALOG KWIKPEN 100 UNIT/ML KwikPen Inject 0.18-0.22 mLs (18-22 Units total) into the skin See admin instructions. Inject 18 units subcutaneous in the morning, inject 20 units subcutaneous at lunchtime, and inject 22 units subcutaneous at night. Plus sliding scale as directed. 15 mL 0  . Insulin Glargine, 2 Unit Dial, (TOUJEO MAX SOLOSTAR) 300 UNIT/ML SOPN Inject 44 Units into the skin daily. 9 mL 0  . Insulin Pen Needle (B-D ULTRAFINE III SHORT PEN) 31G X 8 MM MISC USE 5 TIMES DAILY AS  DIRECTED 75 each 0  . loratadine (CLARITIN) 10 MG tablet Take 10 mg by mouth daily.    Marland Kitchen losartan (COZAAR) 100 MG tablet Take 1 tablet (100 mg total) by mouth at bedtime. 15 tablet 0  . montelukast (SINGULAIR) 10 MG tablet Take by mouth.    . spironolactone (ALDACTONE) 25 MG tablet Take 0.5 tablets (12.5 mg total) by mouth daily. 15 tablet 0  . pantoprazole (PROTONIX) 40 MG tablet Take 1 tablet (40 mg total) by mouth daily. (Patient not taking: Reported on 10/15/2019) 30 tablet 0   No  current facility-administered medications for this encounter.    ECOG PERFORMANCE STATUS:  0 - Asymptomatic  REVIEW OF SYSTEMS: Patient denies any weight loss, fatigue, weakness, fever, chills or night sweats. Patient denies any loss of vision, blurred vision. Patient denies any ringing  of the ears or hearing loss. No irregular heartbeat. Patient denies heart murmur or history of fainting. Patient denies any chest pain or pain radiating to her upper extremities. Patient denies any shortness of breath, difficulty breathing at night, cough or hemoptysis. Patient denies any swelling in the lower legs. Patient denies any nausea vomiting, vomiting of blood, or coffee ground material in the vomitus. Patient denies any stomach pain. Patient states has had normal bowel movements no significant constipation or diarrhea. Patient denies any dysuria, hematuria or significant nocturia. Patient denies any problems walking, swelling in the joints or loss of balance. Patient denies any skin changes, loss of hair or loss of weight. Patient denies any excessive worrying or anxiety or significant depression. Patient denies any problems with insomnia. Patient denies excessive thirst, polyuria, polydipsia. Patient denies any swollen glands, patient denies easy bruising or easy bleeding. Patient denies any recent infections, allergies or URI. Patient "s visual fields have not changed significantly in recent time.  PHYSICAL EXAM: BP (!) 156/98 (BP Location: Left Arm, Patient Position: Sitting)   Pulse 78   Resp 16   Wt 207 lb 3.2 oz (94 kg)   BMI 27.34 kg/m  Well-developed well-nourished patient in NAD. HEENT reveals PERLA, EOMI, discs not visualized.  Oral cavity is clear. No oral mucosal lesions are identified. Neck is clear without evidence of cervical or supraclavicular adenopathy. Lungs are clear to A&P. Cardiac examination is essentially unremarkable with regular rate and rhythm without murmur rub or thrill. Abdomen  is benign with no organomegaly or masses noted. Motor sensory and DTR levels are equal and symmetric in the upper and lower extremities. Cranial nerves II through XII are grossly intact. Proprioception is intact. No peripheral adenopathy or edema is identified. No motor or sensory levels are noted. Crude visual fields are within normal range.  LABORATORY DATA: Pathology report reviewed    RADIOLOGY RESULTS: CT scan of abdomen pelvis ordered   IMPRESSION: Stage IIa Gleason 7 (3+4) adenocarcinoma the prostate in 49 year old male presenting with a PSA of 6.6  PLAN: This time of gone over treatment options with the patient including both external beam radiation therapy image guided as well as I-125 interstitial implant.  Risks and benefits of both types of procedures were reviewed with the patient including increased lower urinary tract symptoms diarrhea fatigue alteration of blood count and radiation safety precautions for I-125 interstitial implant.  I have explained to him that his young age my #1 choice if possible would be robotic prostatectomy since there is no salvage option if we go with radiation first and he develops a recurrence whereas with surgery we can always salvage with radiation therapy if he has biochemical failure.  Patient would like to think about his treatment options.  We will contact him next week.  Patient comprehends my treatment recommendations well.  I would like to take this opportunity to thank you for allowing me to participate in the care of your patient.Noreene Filbert, MD

## 2019-12-05 LAB — URINALYSIS, COMPLETE
Bilirubin, UA: NEGATIVE
Leukocytes,UA: NEGATIVE
Nitrite, UA: NEGATIVE
Specific Gravity, UA: 1.01 (ref 1.005–1.030)
Urobilinogen, Ur: 0.2 mg/dL (ref 0.2–1.0)
pH, UA: 5 (ref 5.0–7.5)

## 2019-12-05 LAB — MICROSCOPIC EXAMINATION: Bacteria, UA: NONE SEEN

## 2019-12-07 ENCOUNTER — Other Ambulatory Visit: Payer: Self-pay | Admitting: Radiology

## 2019-12-07 DIAGNOSIS — C61 Malignant neoplasm of prostate: Secondary | ICD-10-CM

## 2019-12-08 LAB — CULTURE, URINE COMPREHENSIVE

## 2019-12-08 IMAGING — CR DG CHEST 2V
2 series · 2 of 2 positions shown · non-contrast
Comparison: None.

CLINICAL DATA: Patient presents to the ED with cough and congestion
since [REDACTED] worse the past 2 days. Patient reports cough has
caused chest soreness and reports feeling short of breath.
Tachypneic. Expiratory wheezes. History of systolic heart failure.

EXAM:
CHEST - 2 VIEW

[chest pa]
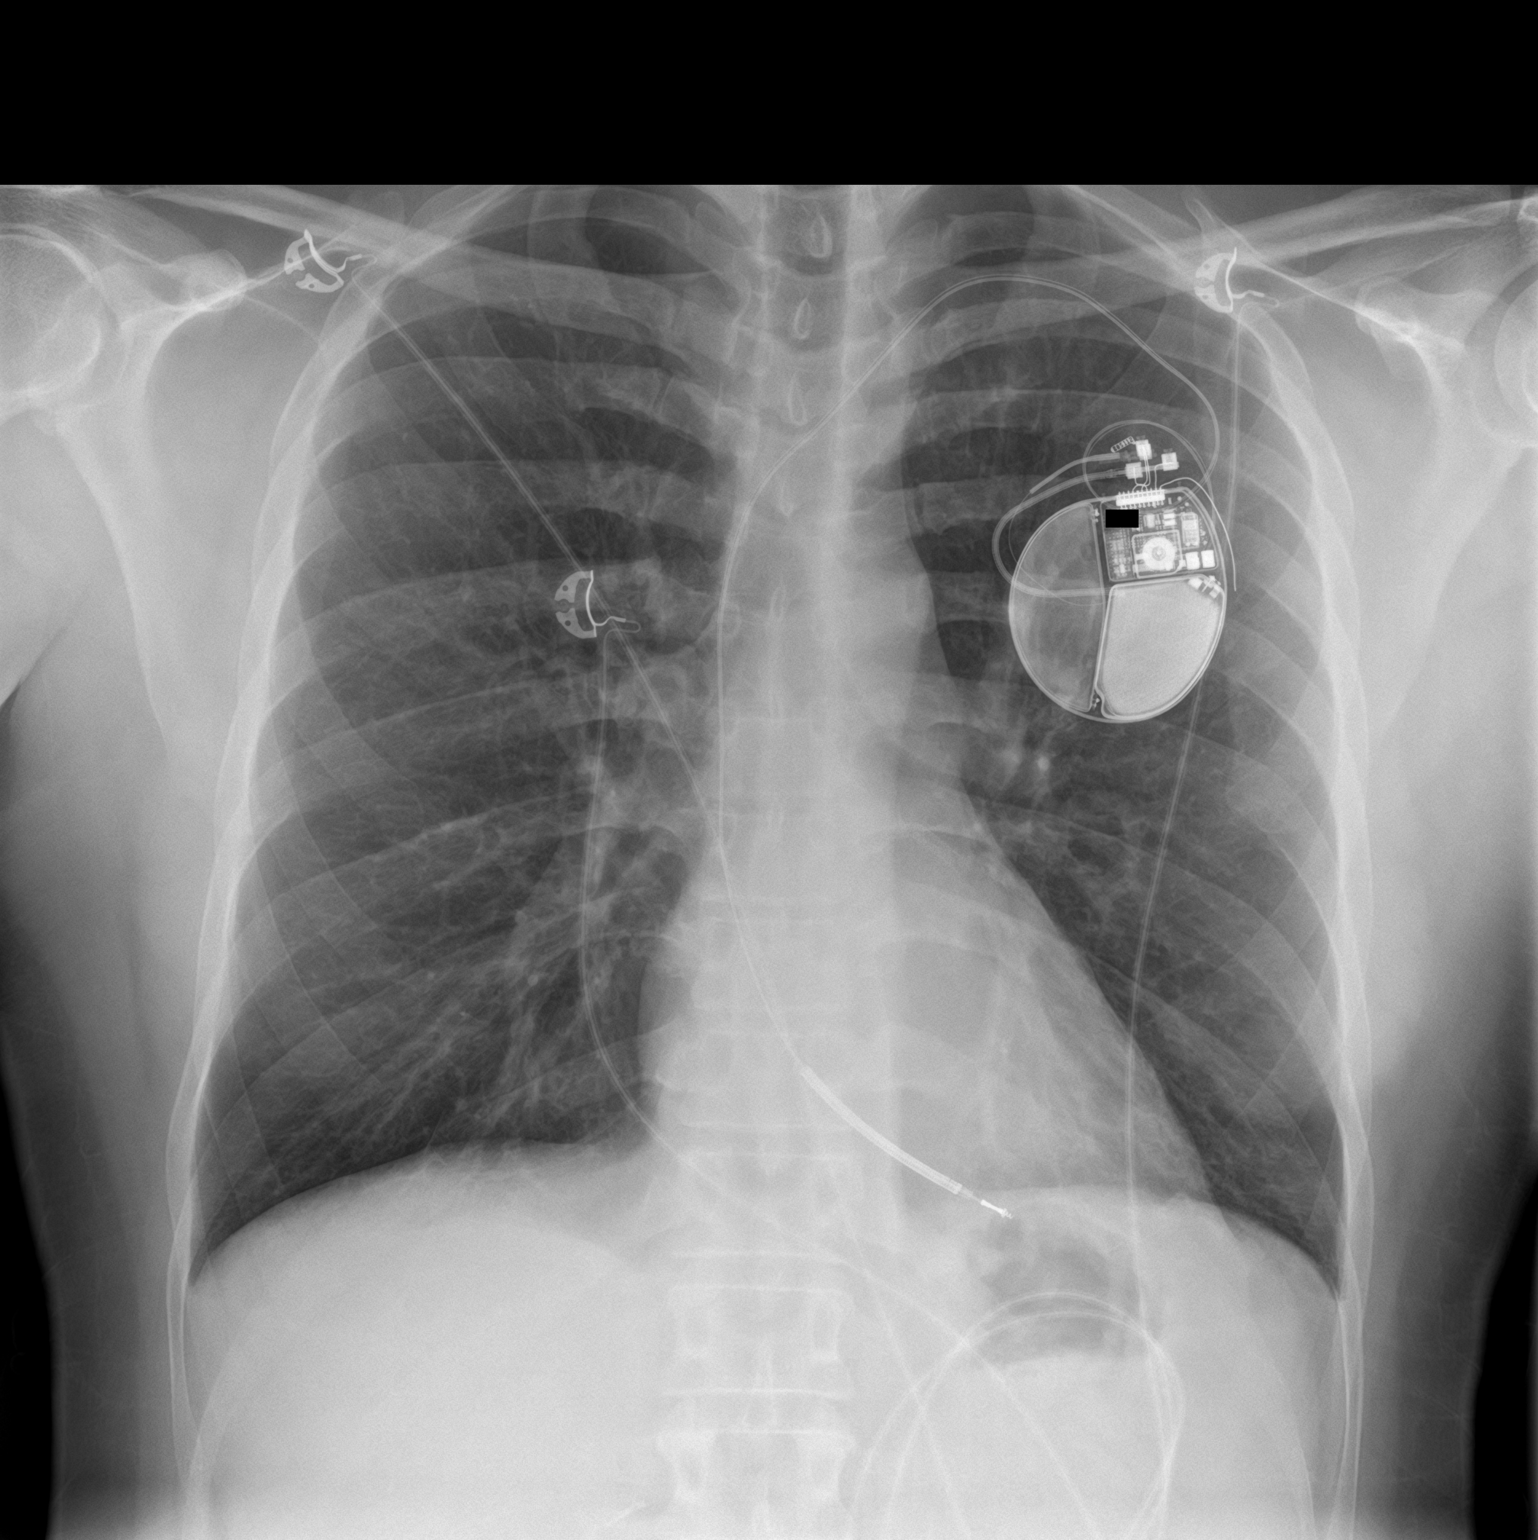

[chest lat]
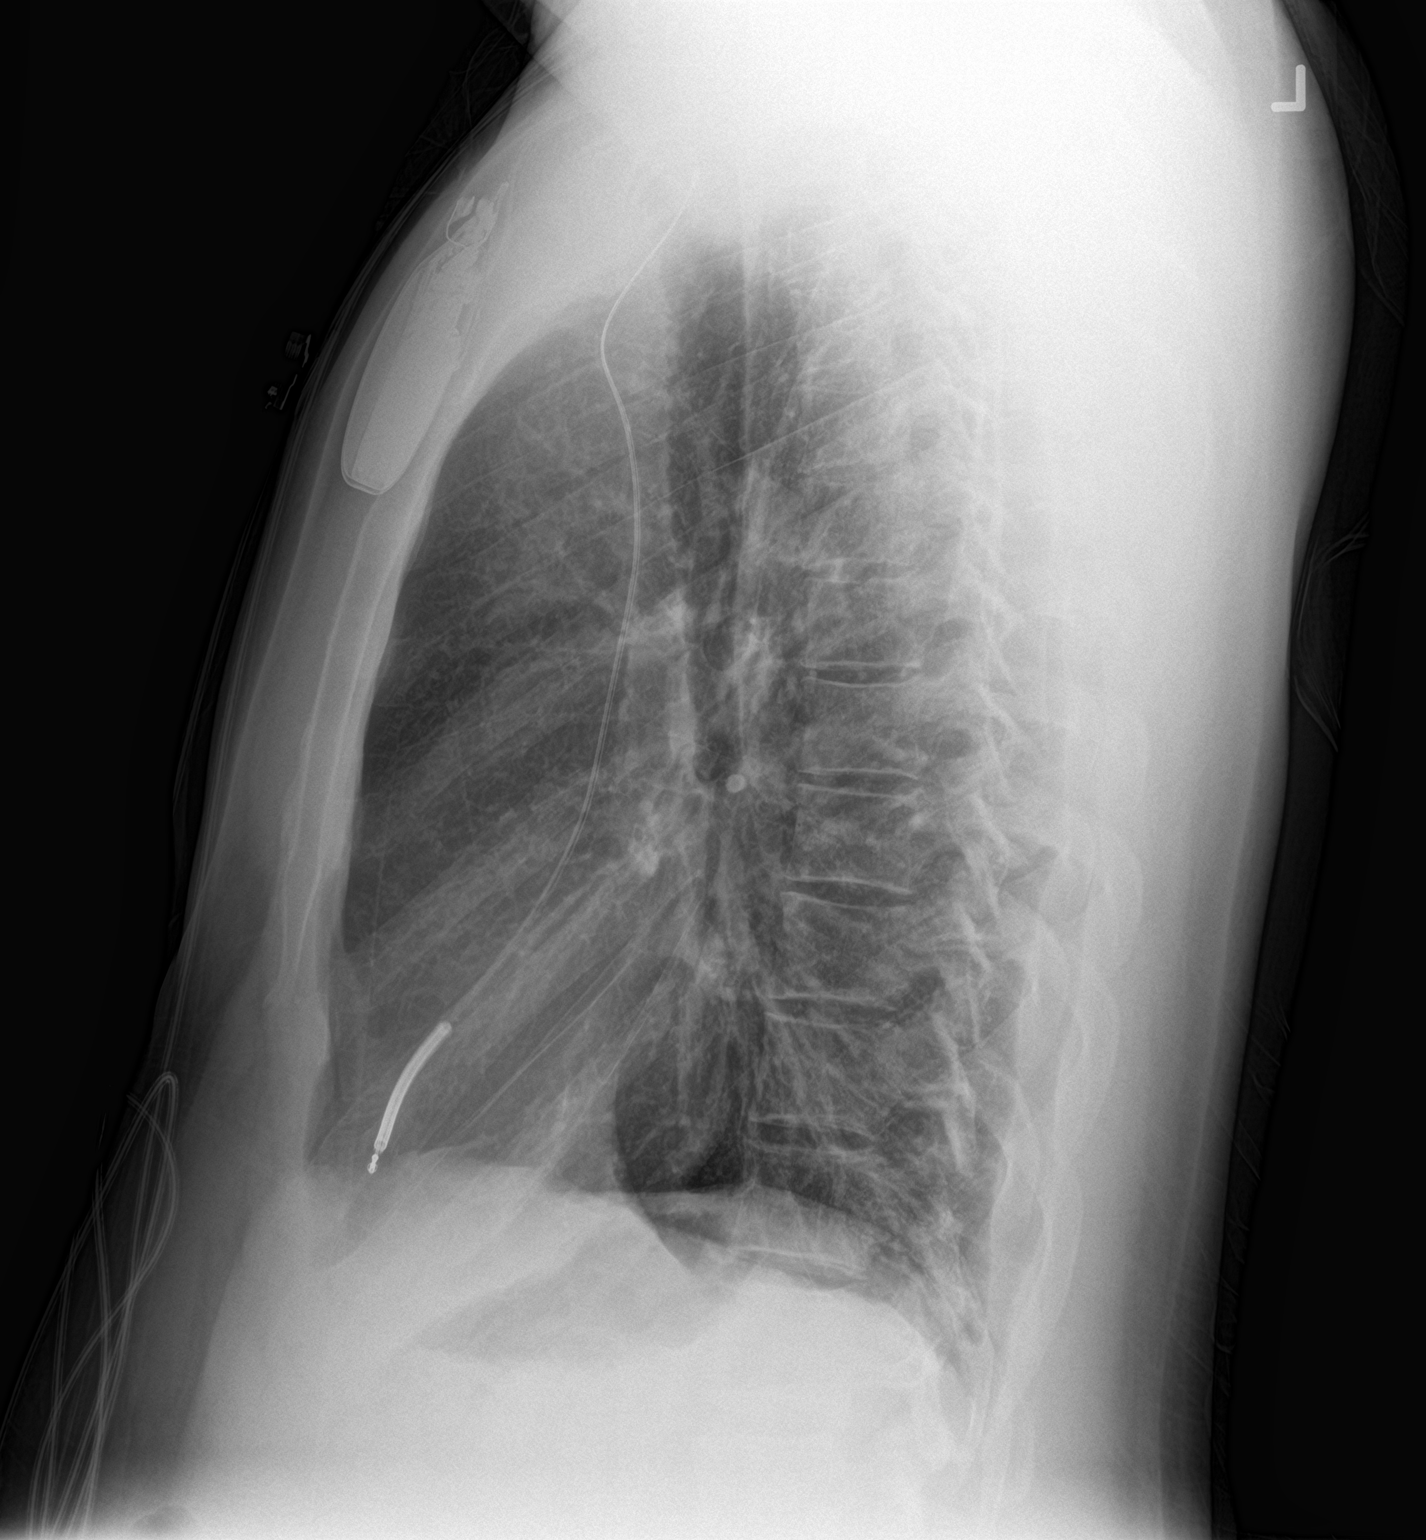

[2 of 2 positions shown; findings below may reference images not displayed]

FINDINGS: Left sided transvenous pacemaker lead overlies the RIGHT ventricle.
Heart size is normal. Lungs are clear. No pulmonary edema.
IMPRESSION: No active cardiopulmonary disease.

## 2019-12-26 ENCOUNTER — Other Ambulatory Visit: Payer: Self-pay

## 2019-12-26 DIAGNOSIS — Z01818 Encounter for other preprocedural examination: Secondary | ICD-10-CM

## 2019-12-27 ENCOUNTER — Other Ambulatory Visit
Admission: RE | Admit: 2019-12-27 | Discharge: 2019-12-27 | Disposition: A | Discharge status: Home / Self Care | Payer: PRIVATE HEALTH INSURANCE | Source: Ambulatory Visit | Attending: Urology | Admitting: Urology

## 2019-12-27 ENCOUNTER — Other Ambulatory Visit: Payer: PRIVATE HEALTH INSURANCE

## 2019-12-27 ENCOUNTER — Other Ambulatory Visit: Payer: Self-pay

## 2019-12-27 ENCOUNTER — Ambulatory Visit: Payer: PRIVATE HEALTH INSURANCE | Attending: Urology | Admitting: Physical Therapy

## 2019-12-27 DIAGNOSIS — M4125 Other idiopathic scoliosis, thoracolumbar region: Secondary | ICD-10-CM | POA: Insufficient documentation

## 2019-12-27 DIAGNOSIS — R278 Other lack of coordination: Secondary | ICD-10-CM | POA: Insufficient documentation

## 2019-12-27 DIAGNOSIS — M2170 Unequal limb length (acquired), unspecified site: Secondary | ICD-10-CM | POA: Insufficient documentation

## 2019-12-27 DIAGNOSIS — Z01818 Encounter for other preprocedural examination: Secondary | ICD-10-CM

## 2019-12-27 LAB — MICROSCOPIC EXAMINATION: Bacteria, UA: NONE SEEN

## 2019-12-27 LAB — URINALYSIS, COMPLETE
Bilirubin, UA: NEGATIVE
Ketones, UA: NEGATIVE
Leukocytes,UA: NEGATIVE
Nitrite, UA: NEGATIVE
Specific Gravity, UA: >1.03 — ABNORMAL HIGH (ref 1.005–1.030)
Urobilinogen, Ur: 0.2 mg/dL (ref 0.2–1.0)
pH, UA: 6.5 (ref 5.0–7.5)

## 2019-12-27 NOTE — Pre-Procedure Instructions (Signed)
[facility] COMPLETE WO IMAGING [facility]HANCING AGENT Order# [id number] Reading physician: Nelva Bush, MD Ordering physician: Nelva Bush, MD Study date: 11/24/16  Echocardiogram (Accession [id number]) (Order [id number]) Echocardiography Date: 11/24/2016 Department: [facility] (2A) Released By: Ruben Im, RN (auto-released) Authorizing: Nelva Bush, MD  Echocardiogram Order #: [id number] Accession #: [id number] Patient Info  Patient name: Joseph [name]  MRN: [id number]  Age: 49 y.o.  Sex: male  MyChart Results Release  MyChart Status: Active Results Release  Vitals  BP Height Weight BSA (Calculated - sq m)  151/98 6\' 1"  (1.854 m) 97.5 kg 2.24 sq meters  Study Result          *[place], [place] [address]               [phone]   -------------------------------------------------------------------  Transthoracic Echocardiography   Patient:  Joseph [name], Joseph [name]  MR #:    [id number]  Study Date: 11/24/2016  Gender:   M  Age:    49  Height:   185.4 cm  Weight:   96.5 kg  BSA:    2.25 m^2  Pt. Status:  Room:    IC17A   ADMITTING  Nelva Bush, MD  ORDERING   Nelva Bush, MD  REFERRING  Nelva Bush, MD  ATTENDING  Dustin Flock H  SONOGRAPHER Sherrie Sport RDCS  PERFORMING  [facility], [facility]   cc:   -------------------------------------------------------------------  LV EF: 25% -  30%   -------------------------------------------------------------------  Indications:   CHF-acute systolic 123456.21.   -------------------------------------------------------------------  History:  PMH: Systolic heart failure, MI, ischemic  cardiomyopathy, hyperlipidemia. Risk factors: Hypertension.  Diabetes mellitus.    -------------------------------------------------------------------  Study Conclusions   - Left ventricle: The cavity size was mildly dilated. Wall  thickness was increased in a pattern of moderate LVH. Systolic  function was severely reduced. The estimated ejection fraction  was in the range of 25% to 30%. There is thinning and  akinesis/dyskinesis of the anterior wall, anteroseptal wall, and  apex. Doppler parameters are consistent with restrictive  physiology, indicative of decreased left ventricular diastolic  compliance and/or increased left atrial pressure.  - Mitral valve: There was mild to moderate regurgitation.  - Left atrium: The atrium was mildly dilated.  - Right ventricle: The cavity size was normal. Systolic function  was normal.  - Pericardium, extracardiac: A small pericardial effusion was  identified posterior to the heart.   -------------------------------------------------------------------  Study data:  Study status: Routine. Procedure: The patient  reported no pain pre or post test. Transthoracic echocardiography.  Image quality was adequate.     Transthoracic  echocardiography. M-mode, complete 2D, spectral Doppler, and color  Doppler. Birthdate: Patient birthdate: 14-Mar-1971. Age: Patient  is 49 yr old. Sex: Gender: male.  BMI: 28.1 kg/m^2. Blood  pressure:   112/69 Patient status: Inpatient. Study date:  Study date: 11/24/2016. Study time: 10:03 AM. Location: ICU/CCU    -------------------------------------------------------------------   -------------------------------------------------------------------  Left ventricle: The cavity size was mildly dilated. Wall thickness  was increased in a pattern of moderate LVH. Systolic function was  severely reduced. The estimated ejection fraction was in the range  of 25% to 30%. There is thinning and akinesis/dyskinesis of the  anterior wall, anteroseptal wall, and  apex. Doppler parameters are  consistent with restrictive physiology, indicative of decreased  left ventricular diastolic compliance and/or increased left atrial  pressure.   -------------------------------------------------------------------  Aortic valve:  Structurally normal valve.  Cusp separation was  normal. Doppler: Transvalvular velocity was within the normal  range. There was no stenosis. There was no regurgitation.  Peak  velocity ratio of LVOT to aortic valve: 0.92. Valve area (Vmax):  2.89 cm^2. Indexed valve area (Vmax): 1.29 cm^2/m^2.  Peak  gradient (S): 6 mm Hg.   -------------------------------------------------------------------  Aorta: Aortic root: The aortic root was normal in size.   -------------------------------------------------------------------  Mitral valve:  Structurally normal valve.  Leaflet separation was  normal. Doppler: Transvalvular velocity was within the normal  range. There was no evidence for stenosis. There was mild to  moderate regurgitation.  Peak gradient (D): 5 mm Hg.   -------------------------------------------------------------------  Left atrium: The atrium was mildly dilated.   -------------------------------------------------------------------  Right ventricle: The cavity size was normal. Pacer wire or  catheter noted in right ventricle. Systolic function was normal.    -------------------------------------------------------------------  Pulmonic valve:  Structurally normal valve.  Cusp separation was  normal. Doppler: Transvalvular velocity was within the normal  range. There was no regurgitation.   -------------------------------------------------------------------  Tricuspid valve: Poorly visualized.   -------------------------------------------------------------------  Pulmonary artery:  The main pulmonary artery was normal-sized.  Systolic pressure could not be accurately estimated.    -------------------------------------------------------------------  Right atrium: The atrium was at the upper limits of normal in  size.   -------------------------------------------------------------------  Pericardium: A small pericardial effusion was identified posterior  to the heart.   -------------------------------------------------------------------  Systemic veins:  Inferior vena cava: Not well visualized.   -------------------------------------------------------------------  Measurements   Left ventricle              Value     Reference  LV ID, ED, PLAX chordal     (H)   57.5 mm    43 - 52  LV ID, ES, PLAX chordal     (H)   49.9 mm    23 - 38  LV fx shortening, PLAX chordal  (L)   13  %    >=29  LV PW thickness, ED           14.1 mm    ----------  IVS/LV PW ratio, ED           0.53      <=1.3  LV ejection fraction, 1-p A4C      26  %    ----------  LV end-diastolic volume, 2-p       179  ml    ----------  LV end-systolic volume, 2-p       134  ml    ----------  LV ejection fraction, 2-p        25  %    ----------  Stroke volume, 2-p            45  ml    ----------  LV end-diastolic volume/bsa, 2-p     80  ml/m^2  ----------  LV end-systolic volume/bsa, 2-p     60  ml/m^2  ----------  Stroke volume/bsa, 2-p          20  ml/m^2  ----------  LV e&', lateral              9.03 cm/s   ----------  LV E/e&', lateral             12.62     ----------  LV e&', medial  3.59 cm/s   ----------  LV E/e&', medial             31.75     ----------  LV e&', average              6.31 cm/s   ----------  LV E/e&', average             18.07     ----------    Ventricular septum             Value     Reference  IVS thickness, ED            7.43 mm    ----------    LVOT                   Value     Reference  LVOT ID, S                20  mm    ----------  LVOT area                3.14 cm^2   ----------  LVOT peak velocity, S          116  cm/s   ----------  LVOT peak gradient, S          5   mm Hg  ----------    Aortic valve               Value     Reference  Aortic valve peak velocity, S      126  cm/s   ----------  Aortic peak gradient, S         6   mm Hg  ----------  Velocity ratio, peak, LVOT/AV      0.92      ----------  Aortic valve area, peak velocity     2.89 cm^2   ----------  Aortic valve area/bsa, peak       1.29 cm^2/m^2 ----------  velocity    Aorta                  Value     Reference  Aortic root ID, ED            31  mm    ----------    Left atrium               Value     Reference  LA ID, A-P, ES              42  mm    ----------  LA ID/bsa, A-P              1.87 cm/m^2  <=2.2  LA volume, S               62.7 ml    ----------  LA volume/bsa, S             27.9 ml/m^2  ----------  LA volume, ES, 1-p A4C          61.4 ml    ----------  LA volume/bsa, ES, 1-p A4C        27.3 ml/m^2  ----------  LA volume, ES, 1-p A2C          63.8 ml    ----------  LA volume/bsa, ES, 1-p A2C        28.4 ml/m^2  ----------    Mitral valve  Value     Reference  Mitral E-wave peak velocity       114  cm/s   ----------  Mitral A-wave peak velocity       53.4 cm/s   ----------  Mitral deceleration time     (H)   232  ms    150 - 230   Mitral peak gradient, D         5   mm Hg  ----------  Mitral E/A ratio, peak          2.1      ----------    Right atrium               Value     Reference  RA ID, S-I, ES, A4C           47.7 mm    34 - 49  RA area, ES, A4C             16.5 cm^2   8.3 - 19.5  RA volume, ES, A/L            46.2 ml    ----------  RA volume/bsa, ES, A/L          20.6 ml/m^2  ----------    Right ventricle             Value     Reference  RV ID, ED, PLAX             24.9 mm    19 - 38  TAPSE                  23  mm    ----------    Pulmonic valve              Value     Reference  Pulmonic valve peak velocity, S     82  cm/s   ----------   Legend:  (L) and (H) mark values outside specified reference range.   -------------------------------------------------------------------  Prepared and Electronically Authenticated by   Nelva Bush, MD  2018-02-14T15:50:40  Imaging Info  Echocardiogram (Order [id number]) on 11/24/16  Study History  Echocardiogram (Order [id number]) on 11/24/16  [employer] Images  Show images for Echocardiogram  Images on [employer]  Show images for [name]  Performing Technologist/Nurse  Performing Technologist/Nurse: Gabriel Cirri  Reason for Exam Priority: Routine Not on file  Patient Data  Height 73 in    BP 125/82 mmHg                            Surgical History  Surgical History  Procedure Laterality Date Comment Source  CORONARY ANGIOPLASTY  2011 stent placed x 1     Other Surgical History  Procedure Laterality Date Comment Source  CARDIAC DEFIBRILLATOR PLACEMENT  08/30/2010    CHOLECYSTECTOMY N/A 11/07/2017 Procedure: LAPAROSCOPIC CHOLECYSTECTOMY; Surgeon: Herbert Pun, MD; Location: [facility] ORS; Service: General;  Laterality: N/A;   ESOPHAGOGASTRODUODENOSCOPY (EGD) WITH PROPOFOL N/A 03/20/2018 Procedure: ESOPHAGOGASTRODUODENOSCOPY (EGD) WITH PROPOFOL; Surgeon: Manya Silvas, MD; Location: [facility] ENDOSCOPY; Service: Endoscopy; Laterality: N/A;   LEFT HEART CATH AND CORONARY ANGIOGRAPHY N/A 11/23/2016 Procedure: Left Heart Cath and Coronary Angiography; Surgeon: Nelva Bush, MD; Location: [facility] [place] CV LAB; Service: Cardiovascular; Laterality: N/A;   PROSTATE BIOPSY  2019    PROSTATE BIOPSY N/A 10/23/2019 Procedure: PROSTATE BIOPSY; Surgeon: Abbie Sons, MD; Location: [facility] ORS; Service: Urology; Laterality: N/A;   TRANSRECTAL  ULTRASOUND N/A 10/23/2019 Procedure: TRANSRECTAL ULTRASOUND; Surgeon: Abbie Sons, MD; Location: [facility] ORS; Service: Urology; Laterality: N/A;     Implants   No active implants to display in this view.  Encounter-Level Documents - 11/23/2016:  Scan on 12/18/2016 3:01 PM by Default, Provider, MD Document on 11/27/2016 11:50 PM by Brennan Bailey, RN: ED Encounter Summary Document on 11/27/2016 11:50 PM by Brennan Bailey, RN: ED PB Summary Scan on 11/26/2016 9:06 AM by Default, Provider, MD Document on 11/25/2016 2:02 PM by Terrilyn Saver, RN: IP After Visit Summary Scan on 11/26/2016 9:11 AM by Default, Provider, MD Scan on 11/25/2016 9:02 AM by Default, Provider, MD Scan on 11/24/2016 4:22 PM by Default, Provider, MD     Order-Level Documents - 11/23/2016:  Scan on 11/24/2016 9:07 AM by Default, Provider, MD     Resulted by:  Signed Date/Time  Phone Pager  END, [place] 11/24/2016 3:50 PM [phone]   External Result Report  External Result Report

## 2019-12-27 NOTE — Pre-Procedure Instructions (Signed)
CARDIAC CLEARANCE ON CHART FROM DR PARASCHOS-LOW RISK 

## 2019-12-27 NOTE — Pre-Procedure Instructions (Signed)
Progress Notes - documented in this encounter Isaias Cowman, MD - 10/18/2019 8:45 AM EST Formatting of this note might be different from the original. Established Patient Visit   Chief Complaint: Chief Complaint  Patient presents with  . Follow-up  6 months  Date of Service: 10/18/2019 Date of Birth: Nov 28, 1970 PCP: Magnus Sinning, MD  History of Present Illness: Mr. Hendershot is a 49 y.o.male patient who returns for  1. Anterior STEMI, s/p DES ostial/proximal LAD 03/2010 2. Ischemic cardiomyopathy 3. Chronic systolic congestive heart failure NYHA class II ACC/AHA stage C Etiology: CAD HFrEF 25-30% 4. Status post single-chamber ICD 08/25/2010 5. Hyperlipidemia 6. Type 1 diabetes 7. Obstructive sleep apnea, on CPAP 8. Moderate in-stent restenosis ostial/proximal LAD stent 11/24/16   The patient was recently hospitalized 11/23/2016 with acute on chronic systolic congestive heart failure; emergent cardiac catheterization was performed which revealed moderate in-stent restenosis ostial/proximal LAD with LVEF of 25-30%. The patient was treated conservatively with intravenous diuretic with overall clinical improvement. The patient underwent a sleep study on 02/03/17, which revealed evidence of obstructive sleep apnea, and the patient was started on a CPAP, which he has consistently used. The patient was hospitalized with acute on chronic systolic congestive heart failure 02/24/2017 with peripheral edema, resolved after diuresis.   Patient returns for preoperative cardiovascular assessment prior to prostate biopsy, reports "doing good". He denies chest pain. He has mild chronic exertional dyspnea which is stable and unchanged. He denies palpitations or heart racing. He denies peripheral edema. The patient is active, walks most days, does some exercise at home. ICD interrogation earlier today revealed normal single-chamber ICD function, with underlying rhythm of sinus rhythm, no delivered  shocks, with longevity of 4.5 years.  The patient has essential hypertension, blood pressure well controlled today, currently on carvedilol, furosemide, and losartan, which are well tolerated without apparent side effects. The patient follows a low sodium, no added salt diet.   The patient has hyperlipidemia, LDL cholesterol was 68 on 05/16/2018, on atorvastatin, which is well tolerated without apparent side effects, followed by his primary care provider. The patient follows a low-cholesterol, low-fat diet.  The patient has type 1 diabetes, currently on insulin and metformin, followed by [facility]. Hemoglobin A1c was 12.4 on 05/16/2018.   Past Medical and Surgical History  Past Medical History Past Medical History:  Diagnosis Date  . Acute myocardial infarction of anterolateral wall, initial episode of care (CMS-HCC) 2011  stent x 1  . AICD (automatic cardioverter/defibrillator) present  . CAD (coronary artery disease) 08/18/2011  seen by cardiology on 02/26/2016; clear for surgery.. patient denies CP or SOB.  . Cardiomyopathy (CMS-HCC) 2011  at [facility] EF< 35 %  . CHF (congestive heart failure) (CMS-HCC)  . Diabetes mellitus type I (CMS-HCC)  . Erectile dysfunction 08/18/2011  . GERD (gastroesophageal reflux disease) 08/18/2011  . Hyperlipemia 08/18/2011  . Hypertension  . Ischemic cardiomyopathy 08/18/2011  . LADA (latent autoimmune diabetes in adults), managed as type 1 (CMS-HCC)  . MI (myocardial infarction) (CMS-HCC) 03/2010  with stent x1. Denies CP or SOB since 2011  . Presence of automatic implantable cardioverter-defibrillator  [employer]. Model # [id number], serial # [id number]  . Vitamin D deficiency   Past Surgical History He has a past surgical history that includes Cardiac defibrillator placement (03/2010); CARDIAC DEFIBRILLATOR (08/2010); Oral Surgery Operative Note (2007); WRIST SURGERY (Right, 08/2007); Cholecystectomy (N/A, 11/07/2017); egd (03/20/2018); and Cardiac  catheterization.   Medications and Allergies  Current Medications  Current Outpatient Medications  Medication  Sig Dispense Refill  . acetone, urine, test (ACETONE, URINE, TEST) strip use to ckeck for ketones in your urine if your blood sugar is over 250 12  . albuterol 90 mcg/actuation inhaler Inhale into the lungs  . aspirin 81 mg tablet 1 tab by mouth daily (Patient taking differently: Take 1 tablet by mouth once daily. Cardiac stent x 1. Antiplatelet form in chart. )  . atorvastatin (LIPITOR) 40 MG tablet Take 1 tablet by mouth once daily.  . BD ULTRA-FINE SHORT PEN NEEDLE 31 gauge x 5/16" needle USE 5 TIMES DAILY AS DIRECTED 450 each 3  . carvedilol (COREG) 12.5 MG tablet Take 1 tablet by mouth once daily.  . cholecalciferol (VITAMIN D3) 2,000 unit capsule Take 1 capsule (2,000 Units total) by mouth daily. With food 90 capsule 6  . digoxin (LANOXIN) 0.125 MG tablet TAKE ONE TABLET BY MOUTH EVERY DAY 90 tablet 3  . fluticasone (FLONASE) 50 mcg/actuation nasal spray 2 spray in each nostril daily  . FUROsemide (LASIX) 20 MG tablet Take by mouth 2 (two) times daily.   Marland Kitchen glucagon (GLUCAGEN) 1 mg/mL injection Inject 1 mL (1 mg total) into the muscle as directed for Low blood sugar 1 mL 2  . insulin glargine U-300 conc (TOUJEO MAX U-300 SOLOSTAR) 300 unit/mL (3 mL) InPn Inject 44 Units subcutaneously once daily 45 mL 3  . insulin LISPRO (HUMALOG KWIKPEN INSULIN) pen injector (concentration 100 units/mL) INJECT SUBCUTANEOUSLY 18 UNITS AT BREAKFAST, 20 UNITS AT LUNCH, 22 UNITS AT DINNER PLUS SLIDING SCALE. TOTAL DAILY DOSE 75 UNITS DAILY 75 mL 3  . KLOR-CON M10 10 mEq ER tablet TAKE ONE TABLET BY MOUTH EVERY DAY 30 tablet 11  . loratadine (CLARITIN) 10 mg capsule Take 10 mg by mouth once daily.  Marland Kitchen losartan (COZAAR) 100 MG tablet Take by mouth  . metFORMIN (GLUCOPHAGE-XR) 500 MG XR tablet Take 2 tablets (1,000 mg total) by mouth daily with dinner 180 tablet 3  . metoclopramide (REGLAN) 10 MG  tablet TAKE ONE TABLET BY MOUTH 4 TIMES DAILY BEFORE MEALS 120 tablet PRN  . pantoprazole (PROTONIX) 40 MG DR tablet Take 1 tablet (40 mg total) by mouth 2 (two) times daily Take 1 tablet 30 mins before breakfast and 1 tablet 30 mins before dinner. 90 tablet 1  . pen needle, diabetic 31 gauge x 3/16" needle Use 1 Syringe as directed. Use 1 pen needle per injection 5-6 times a day 200 each 3  . spironolactone (ALDACTONE) 25 MG tablet TAKE ONE-HALF TABLET BY MOUTH ONCE DAILY 45 tablet 6  . sucralfate (CARAFATE) 1 gram tablet Take 1 tablet (1 g total) by mouth 3 (three) times daily Dissolve tablet in 1 Tbsp water to create slurry. Hold vit D while taking sucralfate. 270 tablet 1   No current facility-administered medications for this visit.   Allergies: Vancomycin and Lisinopril  Social and Family History  Social History reports that he has never smoked. He has never used smokeless tobacco. He reports that he does not drink alcohol or use drugs.  Family History Family History  Problem Relation Age of Onset  . High blood pressure (Hypertension) Mother  . Reflux disease Sister  . Dementia Maternal Grandmother  . Arthritis Maternal Grandmother  . Diabetes Maternal Grandfather  . High blood pressure (Hypertension) Paternal Grandmother  . Arthritis Paternal Grandmother  . Myocardial Infarction (Heart attack) Paternal Grandmother  . Sudden death (unexpected death due to unknown cause) Neg Hx   Review of Systems  Review of Systems: The patient denies chest pain, reports intermittent exertional shortness of breath, without orthopnea, paroxysmal nocturnal dyspnea, pedal edema, palpitations, heart racing, presyncope, syncope, with abdominal pain, with dyspepsia and bloating. Review of 10 Systems is negative except as described above.  Physical Examination   Vitals:BP 118/60  Pulse 76  Ht 185.4 cm (6\' 1" )  Wt 93.6 kg (206 lb 6.4 oz)  SpO2 93%  BMI 27.23 kg/m  Ht:185.4 cm (6\' 1" ) Wt:93.6 kg  (206 lb 6.4 oz) [id number] surface area is 2.2 meters squared. Body mass index is 27.23 kg/m.  General: Alert and oriented. Well-appearing. No acute distress. HEENT: Pupils equally reactive to light and accomodation  Neck: Supple, no JVD Lungs: Normal effort of breathing; clear to auscultation bilaterally; no wheezes, rales, rhonchi Heart: Regular rate and rhythm. No murmur, rub, or gallop Abdomen: nondistended, with normal bowel sounds Extremities: no cyanosis, clubbing, or edema Peripheral Pulses: 2+ radial bilaterally Skin: Warm, dry, no diaphoresis  Assessment   49 y.o. male with  1. Preop testing  2. Coronary artery disease involving native coronary artery of native heart without angina pectoris  3. Ischemic cardiomyopathy  4. ST elevation myocardial infarction involving left main coronary artery (CMS-HCC)  5. Essential hypertension  6. Obstructive sleep apnea on CPAP  7. Poorly controlled type 1 diabetes mellitus (CMS-HCC)  8. Hyperlipidemia, unspecified hyperlipidemia type  9. Implantable cardioverter-defibrillator (ICD) in situ   49 year old gentleman with known coronary artery disease, with moderate in-stent restenosis ostial proximal LAD 11/2016, without recurrent chest pain. The patient has ischemic cardiomyopathy, with chronic systolic congestive heart failure, currently appears clinically stable with exertional dyspnea and chronic peripheral edema. The patient has essential hypertension, blood pressure adequately controlled on current BP medications. The patient has hyperlipidemia on atorvastatin. The patient should be at acceptable risk for low risk prostate biopsy.  Plan   1. Continue current medications 2. Counseled patient about low-sodium diet 3. DASH diet printed instructions given to the patient 4. Counseled patient about low-cholesterol diet 5. Continue atorvastatin for hyperlipidemia management.  6. Heart healthy diet printed instructions given to the  patient 7. ICD clinic as scheduled 8. Proceed with prostate biopsy as planned  9. Continue carvedilol pre-, peri and postoperatively 10. Return to clinic for follow-up in 4 months  No orders of the defined types were placed in this encounter.  Return in about 4 months (around 02/15/2020).   Isaias Cowman, MD PhD [facility]  Electronically signed by Isaias Cowman, MD at 10/18/2019 9:21 AM EST

## 2019-12-27 NOTE — Patient Instructions (Addendum)
   Lengthen Back rib by _ shoulder    Lie on   side , pillow between knees  Pull  arm overhead over mattress, grab the edge of mattress,pull it upward, drawing elbow away from ears  Breathing   Open book (handout)  Lying on  _ side , rotating  __ only this week       Side of hip stretch:  Reclined twist for hips and side of the hips/ legs  Lay on your back, knees bend Scoot hips to the R , leave shoulders in place [place]  Knees 30 deg  to the L / R  side  20 reps  ___   Wear R shoe lift   ____  Stretches :     kitchen counter stretches  Hands on kitchen counter,   Palms shoulder width apart  Minisquat postion Trunk is parallel to floor  A) Pull buttocks back to lengthen spine, knees bent  3 breaths   B) Bring R hand to the L, and stretch the R side trunk  3 breaths   Brings hands to center again Do the same to the L side stretch by placing L hand on top of R   D) Modified thread the needle L hand on R thigh, R thigh pushing out slightly as the L hands pull in,  elbow bent and pulls to the  L,  Look under R armpit   ___  Deep core level 1 and 2 *handout)

## 2019-12-27 NOTE — Pre-Procedure Instructions (Signed)
Progress Notes - documented in this encounter Joseph Glazier, MD - 11/27/2019 3:45 PM EST Formatting of this note might be different from the original.     [place] PATIENT ENCOUNTER   Chief Complaint: Evaluation for obstructive sleep apnea, severe persistent asthma  HPI :  [name] is 49 y.o. male who was referred to [facility] clinic due to persistent cough and SOB. He was diagnosed with Asthma 2 years ago. He was in hospital due to cough and received breathing treatments with some improvement. He reports "coughing spells" which cause him to have worsening shortness of breath. He has CHF and recent diagnosis of prostate cancer Feb 2021. He had steroid injection in hospital for asthma exacerbation and also had prednisone taper and reports improvement in symptoms after this.   Past medical History: He has a past medical history of Acute myocardial infarction of anterolateral wall, initial episode of care (CMS-HCC) (2011), AICD (automatic cardioverter/defibrillator) present, CAD (coronary artery disease) (08/18/2011), Cardiomyopathy (CMS-HCC) (2011), CHF (congestive heart failure) (CMS-HCC), Diabetes mellitus type I (CMS-HCC), Erectile dysfunction (08/18/2011), GERD (gastroesophageal reflux disease) (08/18/2011), Hyperlipemia (08/18/2011), Hypertension, Ischemic cardiomyopathy (08/18/2011), LADA (latent autoimmune diabetes in adults), managed as type 1 (CMS-HCC), MI (myocardial infarction) (CMS-HCC) (03/2010), Presence of automatic implantable cardioverter-defibrillator, and Vitamin D deficiency.  is allergic to vancomycin and lisinopril.  ROS: 10 point review of systems has been conducted during interview and evaluation today. Remainder has been reviewed and is negative except as per HPI  Current Medications (including changes made at this visit) Current Outpatient Medications  Medication Sig Dispense Refill  . acetone, urine, test  (ACETONE, URINE, TEST) strip use to ckeck for ketones in your urine if your blood sugar is over 250 12  . albuterol 90 mcg/actuation inhaler Inhale into the lungs  . aspirin 81 mg tablet 1 tab by mouth daily (Patient taking differently: Take 1 tablet by mouth once daily. Cardiac stent x 1. Antiplatelet form in chart. )  . atorvastatin (LIPITOR) 40 MG tablet Take 1 tablet by mouth once daily.  . carvedilol (COREG) 12.5 MG tablet Take 1 tablet by mouth once daily.  . cholecalciferol (VITAMIN D3) 2,000 unit capsule Take 1 capsule (2,000 Units total) by mouth daily. With food 90 capsule 6  . digoxin (LANOXIN) 0.125 MG tablet TAKE ONE TABLET BY MOUTH EVERY DAY 90 tablet 3  . fluticasone (FLONASE) 50 mcg/actuation nasal spray 2 spray in each nostril daily  . FUROsemide (LASIX) 20 MG tablet Take by mouth 2 (two) times daily.   Marland Kitchen glucagon (GLUCAGEN) 1 mg/mL injection Inject 1 mL (1 mg total) into the muscle as directed for Low blood sugar 1 mL 2  . insulin glargine U-300 conc (TOUJEO MAX U-300 SOLOSTAR) 300 unit/mL (3 mL) InPn Inject 44 Units subcutaneously once daily 45 mL 3  . insulin LISPRO (HUMALOG KWIKPEN INSULIN) pen injector (concentration 100 units/mL) INJECT SUBCUTANEOUSLY 18 UNITS AT BREAKFAST, 20 UNITS AT LUNCH, 22 UNITS AT DINNER PLUS SLIDING SCALE. TOTAL DAILY DOSE 75 UNITS DAILY 75 mL 3  . KLOR-CON M10 10 mEq ER tablet TAKE ONE TABLET BY MOUTH EVERY DAY 30 tablet 11  . loratadine (CLARITIN) 10 mg capsule Take 10 mg by mouth once daily.  Marland Kitchen losartan (COZAAR) 100 MG tablet Take by mouth  . metFORMIN (GLUCOPHAGE-XR) 500 MG XR tablet Take 2 tablets (1,000 mg total) by mouth daily with dinner 180 tablet 3  . metoclopramide (REGLAN) 10 MG tablet TAKE ONE TABLET  BY MOUTH 4 TIMES DAILY BEFORE MEALS 120 tablet PRN  . pantoprazole (PROTONIX) 40 MG DR tablet Take 1 tablet (40 mg total) by mouth 2 (two) times daily Take 1 tablet 30 mins before breakfast and 1 tablet 30 mins before dinner. 90 tablet 1  .  pen needle, diabetic (BD ULTRA-FINE SHORT PEN NEEDLE) 31 gauge x 5/16" needle 5 (five) times daily 450 each 3  . pen needle, diabetic 31 gauge x 3/16" needle Use 1 Syringe as directed. Use 1 pen needle per injection 5-6 times a day 200 each 3  . spironolactone (ALDACTONE) 25 MG tablet TAKE ONE-HALF TABLET BY MOUTH ONCE DAILY 45 tablet 6  . sucralfate (CARAFATE) 1 gram tablet Take 1 tablet (1 g total) by mouth 3 (three) times daily Dissolve tablet in 1 Tbsp water to create slurry. Hold vit D while taking sucralfate. 270 tablet 1   No current facility-administered medications for this visit.   Social History: He reports that he has never smoked. He has never used smokeless tobacco. He reports that he does not drink alcohol or use drugs.  Surgical History: has a past surgical history that includes Cardiac defibrillator placement (03/2010); CARDIAC DEFIBRILLATOR (08/2010); Oral Surgery Operative Note (2007); WRIST SURGERY (Right, 08/2007); Cholecystectomy (N/A, 11/07/2017); egd (03/20/2018); and Cardiac catheterization.  Family History: His family history includes Arthritis in his maternal grandmother and paternal grandmother; Dementia in his maternal grandmother; Diabetes in his maternal grandfather; High blood pressure (Hypertension) in his mother and paternal grandmother; Myocardial Infarction (Heart attack) in his paternal grandmother; Reflux disease in his sister.  Physical Exam: There were no vitals taken for this visit. GENERAL: NAD, able to speak in complete sentences without dyspnea or cough HEENT: normocephalic, PERRL, EOMI, TM with sharp light reflex bilaterally. Normal external auditory canal. No hypertrophy of nasal turbinates. Clear mucosa in mouth without any exudates or erythema NECK: Supple, no jugular venous distension, nodes, stridor nor thyromegaly. Trachea midline.  CARDIOVASCULAR: RRR, no murmurs, gallops, rubs RESPIRATORY: normal respiratory effort, clear to auscultation  bilaterally. No crackles, or wheezes GASTROINTESTINAL: NABS, soft, non tender, non distended EXTR: No edema, homans sign or cyanosis LYMPHATIC: No nodes in neck or supraclavicular areas INTEGUMENTARY: No rashes, bruising, telantagias, sclerodactaly nor alopecia NEURO: Cranial nerves, motor, sensation intact. Normal gait  Labs & Imaging:   Reviewed CXR serial from 2017 to 2020. Reviewed serial CBC and CMP from 2016 to present.   I have personally reviewed all above lab data and Imaging today and discussed pertinent findings with patient.   MedicalDecision Making:   Impression/Plan:   Severe persistent asthma - patient with daily symptoms and nighttime awakenings as well as functional decline - patient has had to use systemic steroids for >50% of year despite high dose ICS/LABA/LAMA - will obtain zone 3 allergy , immunoglobulins, mold and HP profile , cbc with diff - pulmicort with albuterol nebulizer bid - singulair po dialy  - asthma action plan review for next time - peak flow meter - prednisone and zithromax for red zone symptoms.   Seasonal allergies Zone 3 allergy testing singulair QHS  Obstructive Sleep Apnea  [place]: n Excessive datytime sleepiness: n Gasping or Choking during Sleep: n Morning Headaches n Trouble concentrating : n Depression: n Restlessness during Sleep: n  Epworth Score: n Neck circumference >16in F or >17in M: n Structural abnormalities: n  Obesity: n Caffeine intake : n Alcohol intake : n Medications : n  Symptoms of insomnia, hypersomnia, parasomnias, circadian rhythm disorder: n  CPAP candidate: claustrophobia, interface issues: n  Face to face discussion regarding Sleep Study done with patient and patient is agreeable: patient using CPAP device and reports good use. He needs new supplies  Patient relates understanding our goals for this visit and is agreeable to above plans.   Claudette Stapler, MD [place]   Division of Pulmonary & Critical Care Medicine   Electronically signed by Joseph Glazier, MD at 11/27/2019 8:20 PM EST

## 2019-12-28 ENCOUNTER — Ambulatory Visit
Admission: RE | Admit: 2019-12-28 | Discharge: 2019-12-28 | Disposition: A | Discharge status: Home / Self Care | Payer: PRIVATE HEALTH INSURANCE | Source: Ambulatory Visit | Attending: Urology | Admitting: Urology

## 2019-12-28 ENCOUNTER — Other Ambulatory Visit: Payer: Self-pay | Admitting: Radiology

## 2019-12-28 ENCOUNTER — Telehealth: Payer: Self-pay | Admitting: Urology

## 2019-12-28 ENCOUNTER — Other Ambulatory Visit: Payer: Self-pay

## 2019-12-28 DIAGNOSIS — R3129 Other microscopic hematuria: Secondary | ICD-10-CM | POA: Diagnosis not present

## 2019-12-28 MED ORDER — IOHEXOL 300 MG/ML  SOLN
125.0000 mL | Freq: Once | INTRAMUSCULAR | Status: AC | PRN
  Administered 2019-12-28: 125 mL via INTRAVENOUS

## 2019-12-28 NOTE — Telephone Encounter (Signed)
This patient dropped off a preoperative urine today that had some incidental microscopic hematuria.  He happens to be having a CT scan done today for staging.  Be helpful of this were CT urogram rather than a regular CT with contrast for completeness.  Also like to add flexible cystoscopy to his procedures intraoperatively to work-up blood in the urine.  Please call him and let him know this plan.  Hollice Espy, MD

## 2019-12-28 NOTE — Telephone Encounter (Signed)
Notified patient of hematuria and addition of flexible cystoscopy to his surgery. Patient had no questions and verbalized understanding.

## 2019-12-28 NOTE — Addendum Note (Signed)
Addended by: Tommy Rainwater on: 12/28/2019 10:05 AM   Modules accepted: Orders

## 2019-12-28 NOTE — Therapy (Addendum)
[place] MAIN [facility] SERVICES [address] [place], [place], [address] Phone: [phone]   Fax:  [phone]  Physical Therapy Evaluation  Patient Details  Name: GEORDY LEVE MRN: [id number] Date of Birth: 04/11/1971 Referring Provider (PT): Erlene Quan   Encounter Date: 12/27/2019  PT End of Session - 12/27/19 1715    Visit Number  1    Number of Visits  2    Date for PT Re-Evaluation  01/10/20    PT Start Time  [id number]    PT Stop Time  1710    PT Time Calculation (min)  65 min       Past Medical History:  Diagnosis Date  . AICD (automatic cardioverter/defibrillator) present 2011  . CHF (congestive heart failure) ([place])   . Coronary artery disease   . Diabetes mellitus without complication ([place])   . ED (erectile dysfunction)   . GERD (gastroesophageal reflux disease)   . Hyperlipidemia   . Hypertension   . Ischemic cardiomyopathy   . LADA (latent autoimmune diabetes in adults), managed as type 1 ([place])   . Myocardial infarction [facility]) 2011   anterior MI at [place] s/p BMS to ostial/proximal LAD  . Neuromuscular disorder ([place])    problems in arms. finger tips have been numb/tingling. voltaren works  . Obstructive sleep apnea 2018   cpap. has not used lately. uses a breathe-right strip on nose  . Seizures ([place])    diabetes seizures (low blood sugar); last one 08/2018  . Systolic heart failure ([place])   . Vitamin D deficiency     Past Surgical History:  Procedure Laterality Date  . CARDIAC DEFIBRILLATOR PLACEMENT  08/30/2010  . CHOLECYSTECTOMY N/A 11/07/2017   Procedure: LAPAROSCOPIC CHOLECYSTECTOMY;  Surgeon: Herbert Pun, MD;  Location: [facility] ORS;  Service: General;  Laterality: N/A;  . CORONARY ANGIOPLASTY  2011   stent placed x 1  . ESOPHAGOGASTRODUODENOSCOPY (EGD) WITH PROPOFOL N/A 03/20/2018   Procedure: ESOPHAGOGASTRODUODENOSCOPY (EGD) WITH PROPOFOL;  Surgeon: Manya Silvas, MD;  Location: [facility] ENDOSCOPY;  Service:  Endoscopy;  Laterality: N/A;  . LEFT HEART CATH AND CORONARY ANGIOGRAPHY N/A 11/23/2016   Procedure: Left Heart Cath and Coronary Angiography;  Surgeon: Nelva Bush, MD;  Location: [place] CV LAB;  Service: Cardiovascular;  Laterality: N/A;  . PROSTATE BIOPSY  2019  . PROSTATE BIOPSY N/A 10/23/2019   Procedure: PROSTATE BIOPSY;  Surgeon: Abbie Sons, MD;  Location: [facility] ORS;  Service: Urology;  Laterality: N/A;  . TRANSRECTAL ULTRASOUND N/A 10/23/2019   Procedure: TRANSRECTAL ULTRASOUND;  Surgeon: Abbie Sons, MD;  Location: [facility] ORS;  Service: Urology;  Laterality: N/A;    There were no vitals filed for this visit.   Subjective Assessment - 12/27/19 1614    Subjective Pt has prostate cancer surgery scheduled on 01/03/20. Over the last 6 months, pt noticed slower urine stream but no difficulty with initiating. Pt sometimes he had post-urine dribble or incomplete emptying. Pt has daily bowel movements without straining.  Pt has had LBP for the last 4 months with the start of a new job. Pt lifts 30-40 lbs , standing , moving, walking on concrete floor. Pt used to work out with push ups and strength bands but no sit ups / crunches.    Pertinent History  Pt has had allergies leading to asthma. Pt noticed SOB with walking since 2018. Pt has coughing spells and congestion over the past 2 years and it has gotten worse.  [facility] PT Assessment - 12/27/19 1619      Assessment   Medical Diagnosis  prostate cancer     Referring Provider (PT)  Erlene Quan      Precautions   Precautions  None      Balance Screen   Has the patient fallen in the past 6 months  No      Observation/Other Assessments   Scoliosis  R shoulder higher, L iliac crest higher      Single Leg Stance   Comments  L SLS with more difficutly,  decreased enduranced       AROM   Overall AROM Comments  --      PROM   Overall PROM Comments  hip flex in supine 90 deg       Palpation   Spinal mobility  L  rotation and sideflexion limited compared to R     SI assessment   L ASIS higher/ medial malleoli higher > R                 Objective measurements completed on examination: See above findings.    Pelvic Floor Special Questions - 12/27/19 1627    Diastasis Recti  abdominal bulging        [facility] Adult PT Treatment/Exercise - 12/27/19 1630      Therapeutic Activites    Therapeutic Activities  Other Therapeutic Activities    Other Therapeutic Activities  fitted shoe lift in R       Neuro Re-ed    Neuro Re-ed Details   cued for stretches and deep core level 1        Manual Therapy   Manual therapy comments  AP mob L, FADDIR L,  paraspinal STM/MWM B                 PT Short Term Goals - 12/28/19 1102      PT SHORT TERM GOAL #1   Title  Pt will demo  proper deep core coordination without chest breathing and optimal excursion of diaphragm/pelvic floor    Time  2    Period  Weeks    Status  New    Target Date  01/11/20      PT SHORT TERM GOAL #2   Title  Pt will demo proper alignment and technique with sitting, logrolling, sit to stand, lifting, body mechanics with ADLs to minimize straining of abdominopelvic floor mm and spine.    Time  2    Period  Weeks    Status  New    Target Date  01/11/20      PT SHORT TERM GOAL #3   Title  Pt will demo levelled pelvic girdle with compliance of shoe lift in R shoe, report less LBP, and less abdominal bulging  in order to progress to deep core / pelvic floor strengthening HEP    Time  1    Period  Weeks    Status  Achieved    Target Date  01/04/20                Plan - 12/28/19 1058    Clinical Impression Statement  Pt is a  49  yo male who is scheduled for prostatectomy on 12/27/19. Pt was referred to Pelvic Health PT to train his pelvic floor mm to yield better outcomes with continence post-surgery. Pt reported having weak urine stream and LBP. Pt 's clinical presentations included signs of poor  intraabdominal pressure system which is associated  increased risk for urinary incontinence: _abdominal bulging _leg length difference, uneven shoulder height _limited hip flexibility _dyscoordination of deep core mm _lack of understanding on exercises that places less strain on the abdomen/pelvic floor.   Pt was provided education on etiology of Sx with anatomy, physiology explanation with images along with the benefits of customized pelvic PT Tx based on pt's medical conditions and musculoskeletal deficits.   Following Tx, pt demo'd levelled iliac crest/ shoulder height reported less LBP after manual Tx and being provided shoe lift. Pt would benefit from 1 more session to ensure his abdominal bulging has improved to yield better prognosis with continence post surgery. Pt also benefits from further training with pelvic floor strengthening.      Stability/Clinical Decision Making  Evolving/Moderate complexity    Clinical Decision Making  Moderate    Rehab Potential  Good    PT Frequency  1x / week    PT Duration  2 weeks    PT Treatment/Interventions  Functional mobility training;Therapeutic activities;Neuromuscular re-education;Patient/family education;Therapeutic exercise;Moist Heat;Manual techniques;Balance training;Taping;Gait training    Consulted and Agree with Plan of Care  Patient       Patient will benefit from skilled therapeutic intervention in order to improve the following deficits and impairments:  Improper body mechanics, Hypomobility, Decreased mobility, Impaired sensation, Postural dysfunction, Decreased coordination, Decreased activity tolerance, Decreased endurance, Increased muscle spasms, Decreased strength, Difficulty walking  Visit Diagnosis: Other lack of coordination  Other idiopathic scoliosis, thoracolumbar region  Unequal leg length     Problem List Patient Active Problem List   Diagnosis Date Noted  . 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 Elevated  PSA 07/25/2017  . Erectile dysfunction associated with type 2 diabetes mellitus ([place]) 07/25/2017  . Acute on chronic systolic heart failure ([place]) 11/24/2016  . Shortness of breath   . Abnormal EKG   . DKA (diabetic ketoacidoses) ([place]) 04/18/2016  . CAP (community acquired pneumonia) 04/18/2016  . Accelerated hypertension 04/18/2016  . Abdominal pain 04/18/2016    Jerl Mina ,PT, DPT, E-RYT  12/28/2019, 11:10 AM  [place] MAIN [facility] SERVICES [address] [place], [place], [address] Phone: [phone]   Fax:  [phone]  Name: ADONAI COLBECK MRN: [id number] Date of Birth: 11-Dec-1970

## 2019-12-31 ENCOUNTER — Other Ambulatory Visit: Payer: Self-pay

## 2019-12-31 ENCOUNTER — Encounter
Admission: RE | Admit: 2019-12-31 | Discharge: 2019-12-31 | Disposition: A | Discharge status: Home / Self Care | Payer: PRIVATE HEALTH INSURANCE | Source: Ambulatory Visit | Attending: Urology | Admitting: Urology

## 2019-12-31 DIAGNOSIS — Z01818 Encounter for other preprocedural examination: Secondary | ICD-10-CM | POA: Insufficient documentation

## 2019-12-31 HISTORY — DX: Unspecified asthma, uncomplicated: J45.909

## 2019-12-31 HISTORY — DX: Dyspnea, unspecified: R06.00

## 2019-12-31 HISTORY — DX: Malignant (primary) neoplasm, unspecified: C80.1

## 2019-12-31 LAB — CULTURE, URINE COMPREHENSIVE

## 2019-12-31 NOTE — Patient Instructions (Addendum)
Your procedure is scheduled on: 01-03-20 THURSDAY Report to Same Day Surgery 2nd floor medical mall [facility] Entrance-take elevator on left to 2nd floor.  Check in with surgery information desk.) To find out your arrival time please call [phone] between 1PM - 3PM on 01-02-20 [facility]  Remember: Instructions that are not followed completely may result in serious medical risk, up to and including death, or upon the discretion of your surgeon and anesthesiologist your surgery may need to be rescheduled.    _x___ 1. Do not eat food after midnight the night before your procedure. NO GUM OR CANDY AFTER MIDNIGHT. You may drink WATER up to 2 hours before you are scheduled to arrive at the hospital for your procedure.  Do not drink WATER within 2 hours of your scheduled arrival to the hospital.  Type 1 and type 2 diabetics should only drink water.   ____Ensure clear carbohydrate drink on the way to the hospital for bariatric patients  ____Ensure clear carbohydrate drink 3 hours before surgery.    __x__ 2. No Alcohol for 24 hours before or after surgery.   __x__3. No Smoking or e-cigarettes for 24 prior to surgery.  Do not use any chewable tobacco products for at least 6 hour prior to surgery   ____  4. Bring all medications with you on the day of surgery if instructed.    __x__ 5. Notify your doctor if there is any change in your medical condition     (cold, fever, infections).    x___6. On the morning of surgery brush your teeth with toothpaste and water.  You may rinse your mouth with mouth wash if you wish.  Do not swallow any toothpaste or mouthwash.   Do not wear jewelry, make-up, hairpins, clips or nail polish.  Do not wear lotions, powders, or perfumes.  Do not shave 48 hours prior to surgery. Men may shave face and neck.  Do not bring valuables to the hospital.    [facility] is not responsible for any belongings or valuables.               Contacts, dentures or bridgework  may not be worn into surgery.  Leave your suitcase in the car. After surgery it may be brought to your room.  For patients admitted to the hospital, discharge time is determined by your treatment team.  _  Patients discharged the day of surgery will not be allowed to drive home.  You will need someone to drive you home and stay with you the night of your procedure.    Please read over the following fact sheets that you were given:   [facility] Preparing for Surgery   _x___ TAKE THE FOLLOWING MEDICATION THE MORNING OF SURGERY WITH A SMALL SIP OF WATER. These include:  1. COREG (CARVEDILOL)  2. DIGOXIN (LANOXIN)  3. PROTONIX (PANOTPRAZOLE)  4. TAKE A PROTONIX THE NIGHT BEFORE YOUR SURGERY  5.  6.  ____Fleets enema or Magnesium Citrate as directed.   _x___ Use CHG Soap or sage wipes as directed on instruction sheet   _x___ Use inhalers on the day of surgery and bring to hospital day of surgery-USE YOUR ALBUTEROL INHALER DAY OF SURGERY AND [place]  ____ Stop Metformin and Janumet 2 days prior to surgery.    _X___ NO INSULIN THE MORNING OF YOUR SURGERY  _x___ Follow recommendations from Cardiologist, Pulmonologist or PCP regarding stopping Aspirin, Coumadin, Plavix ,Eliquis, Effient, or Pradaxa, and Pletal-ASPIRIN STOPPED  LAST Thursday (3-18)  X____Stop Anti-inflammatories such as Advil, Aleve, Ibuprofen, Motrin, Naproxen, Naprosyn, Goodies powders or aspirin products NOW-OK to take Tylenol    ____ Stop supplements until after surgery.

## 2019-12-31 NOTE — Pre-Procedure Instructions (Signed)
Pre-Admit Testing Provider Notification Note  Provider Notified: Dr. Lanney Gins  Notification Mode: Fax  Reason: Pulmonary Clearance Request  Response: Fax confirmation received.  Additional Information: Placed on chart. Noted on Pre-Admit Worksheet. Dr. Cherrie Gauze office notified as FYI via fax. Confirmation received.    Signed: Beulah Gandy, RN

## 2019-12-31 NOTE — Addendum Note (Signed)
Addended by: Jerl Mina on: 12/31/2019 09:43 AM   Modules accepted: Orders

## 2020-01-01 ENCOUNTER — Ambulatory Visit: Payer: PRIVATE HEALTH INSURANCE | Admitting: Physical Therapy

## 2020-01-01 ENCOUNTER — Other Ambulatory Visit
Admission: RE | Admit: 2020-01-01 | Discharge: 2020-01-01 | Disposition: A | Discharge status: Home / Self Care | Payer: PRIVATE HEALTH INSURANCE | Source: Ambulatory Visit | Attending: Urology | Admitting: Urology

## 2020-01-01 DIAGNOSIS — Z20822 Contact with and (suspected) exposure to covid-19: Secondary | ICD-10-CM | POA: Diagnosis not present

## 2020-01-01 DIAGNOSIS — Z01812 Encounter for preprocedural laboratory examination: Secondary | ICD-10-CM | POA: Diagnosis present

## 2020-01-01 LAB — TYPE AND SCREEN
ABO/RH(D): A POS
Antibody Screen: NEGATIVE

## 2020-01-01 LAB — BASIC METABOLIC PANEL
Anion gap: 8 (ref 5–15)
BUN: 14 mg/dL (ref 6–20)
CO2: 27 mmol/L (ref 22–32)
Calcium: 8.7 mg/dL — ABNORMAL LOW (ref 8.9–10.3)
Chloride: 106 mmol/L (ref 98–111)
Creatinine, Ser: 0.94 mg/dL (ref 0.61–1.24)
GFR calc Af Amer: >60 mL/min (ref >60)
GFR calc non Af Amer: >60 mL/min (ref >60)
Glucose, Bld: 152 mg/dL — ABNORMAL HIGH (ref 70–99)
Potassium: 3.5 mmol/L (ref 3.5–5.1)
Sodium: 141 mmol/L (ref 135–145)

## 2020-01-01 LAB — PROTIME-INR
INR: 1.1 (ref 0.8–1.2)
Prothrombin Time: 13.7 seconds (ref 11.4–15.2)

## 2020-01-01 LAB — CBC
HCT: 47.3 % (ref 39.0–52.0)
Hemoglobin: 15.8 g/dL (ref 13.0–17.0)
MCH: 30.6 pg (ref 26.0–34.0)
MCHC: 33.4 g/dL (ref 30.0–36.0)
MCV: 91.7 fL (ref 80.0–100.0)
Platelets: 170 10*3/uL (ref 150–400)
RBC: 5.16 MIL/uL (ref 4.22–5.81)
RDW: 12.3 % (ref 11.5–15.5)
WBC: 6.9 10*3/uL (ref 4.0–10.5)
nRBC: 0 % (ref 0.0–0.2)

## 2020-01-01 LAB — SARS CORONAVIRUS 2 (TAT 6-24 HRS): SARS Coronavirus 2: NEGATIVE

## 2020-01-01 NOTE — Pre-Procedure Instructions (Addendum)
REGARDING PULMONARY CLEARANCE REQUEST  Spoke with Chelsea at Dr. Teodoro Kil office. She is calling the patient to ask symptom specific questions. Depending on responses, they may have him come into the office for a PFT before providing a clearance. Will update chart as updates arise.  UPDATE 01/02/20 @ 0730:  Copied from Pulmonary Telephone Encounter Notes:  Telephone Encounter - Kathrin Greathouse, RN - 01/01/2020 4:16 PM EDT PATIENT NEEDS STAT PFT TOMORROW FOR SURGERY SCHEDULED 01/03/20. PLEASE CALL PATIENT TO SCHEDULE.  Electronically signed by Kathrin Greathouse, RN at 01/01/2020 4:17 PM EDT   Telephone Encounter - Marja Kays, RT - 01/01/2020 5:07 PM EDT Patient is scheduled  UPDATE 01/02/20 @ 1415:     CLEARANCE PENDING RESULTS OF PFT

## 2020-01-02 NOTE — Pre-Procedure Instructions (Signed)
PULMONARY CLEARANCE RECEIVED AND ON CHART.

## 2020-01-03 ENCOUNTER — Ambulatory Visit: Payer: PRIVATE HEALTH INSURANCE | Admitting: Anesthesiology

## 2020-01-03 ENCOUNTER — Encounter: Payer: Self-pay | Admitting: Urology

## 2020-01-03 ENCOUNTER — Observation Stay
Admission: RE | Admit: 2020-01-03 | Discharge: 2020-01-04 | Disposition: A | Discharge status: Home / Self Care | Payer: PRIVATE HEALTH INSURANCE | Attending: Urology | Admitting: Urology

## 2020-01-03 ENCOUNTER — Other Ambulatory Visit: Payer: Self-pay

## 2020-01-03 ENCOUNTER — Encounter
Admission: RE | Disposition: A | Discharge status: Home / Self Care | Payer: Self-pay | Source: Home / Self Care | Attending: Urology

## 2020-01-03 DIAGNOSIS — Z888 Allergy status to other drugs, medicaments and biological substances status: Secondary | ICD-10-CM | POA: Diagnosis not present

## 2020-01-03 DIAGNOSIS — E559 Vitamin D deficiency, unspecified: Secondary | ICD-10-CM | POA: Insufficient documentation

## 2020-01-03 DIAGNOSIS — Z794 Long term (current) use of insulin: Secondary | ICD-10-CM | POA: Diagnosis not present

## 2020-01-03 DIAGNOSIS — I110 Hypertensive heart disease with heart failure: Secondary | ICD-10-CM | POA: Diagnosis not present

## 2020-01-03 DIAGNOSIS — R0602 Shortness of breath: Secondary | ICD-10-CM | POA: Diagnosis not present

## 2020-01-03 DIAGNOSIS — Z95810 Presence of automatic (implantable) cardiac defibrillator: Secondary | ICD-10-CM | POA: Insufficient documentation

## 2020-01-03 DIAGNOSIS — I252 Old myocardial infarction: Secondary | ICD-10-CM | POA: Insufficient documentation

## 2020-01-03 DIAGNOSIS — I340 Nonrheumatic mitral (valve) insufficiency: Secondary | ICD-10-CM | POA: Insufficient documentation

## 2020-01-03 DIAGNOSIS — Z881 Allergy status to other antibiotic agents status: Secondary | ICD-10-CM | POA: Insufficient documentation

## 2020-01-03 DIAGNOSIS — E1110 Type 2 diabetes mellitus with ketoacidosis without coma: Secondary | ICD-10-CM | POA: Diagnosis not present

## 2020-01-03 DIAGNOSIS — I2510 Atherosclerotic heart disease of native coronary artery without angina pectoris: Secondary | ICD-10-CM | POA: Insufficient documentation

## 2020-01-03 DIAGNOSIS — K828 Other specified diseases of gallbladder: Secondary | ICD-10-CM | POA: Insufficient documentation

## 2020-01-03 DIAGNOSIS — I255 Ischemic cardiomyopathy: Secondary | ICD-10-CM | POA: Insufficient documentation

## 2020-01-03 DIAGNOSIS — I5023 Acute on chronic systolic (congestive) heart failure: Secondary | ICD-10-CM | POA: Insufficient documentation

## 2020-01-03 DIAGNOSIS — R569 Unspecified convulsions: Secondary | ICD-10-CM | POA: Diagnosis not present

## 2020-01-03 DIAGNOSIS — Z9049 Acquired absence of other specified parts of digestive tract: Secondary | ICD-10-CM | POA: Diagnosis not present

## 2020-01-03 DIAGNOSIS — E785 Hyperlipidemia, unspecified: Secondary | ICD-10-CM | POA: Insufficient documentation

## 2020-01-03 DIAGNOSIS — C61 Malignant neoplasm of prostate: Principal | ICD-10-CM | POA: Diagnosis present

## 2020-01-03 DIAGNOSIS — J45909 Unspecified asthma, uncomplicated: Secondary | ICD-10-CM | POA: Diagnosis not present

## 2020-01-03 DIAGNOSIS — Z79899 Other long term (current) drug therapy: Secondary | ICD-10-CM | POA: Diagnosis not present

## 2020-01-03 DIAGNOSIS — Z955 Presence of coronary angioplasty implant and graft: Secondary | ICD-10-CM | POA: Diagnosis not present

## 2020-01-03 DIAGNOSIS — K219 Gastro-esophageal reflux disease without esophagitis: Secondary | ICD-10-CM | POA: Insufficient documentation

## 2020-01-03 DIAGNOSIS — E1165 Type 2 diabetes mellitus with hyperglycemia: Secondary | ICD-10-CM | POA: Diagnosis not present

## 2020-01-03 DIAGNOSIS — Z8249 Family history of ischemic heart disease and other diseases of the circulatory system: Secondary | ICD-10-CM | POA: Insufficient documentation

## 2020-01-03 DIAGNOSIS — G4733 Obstructive sleep apnea (adult) (pediatric): Secondary | ICD-10-CM | POA: Insufficient documentation

## 2020-01-03 HISTORY — PX: CYSTOSCOPY: SHX5120

## 2020-01-03 HISTORY — PX: ROBOT ASSISTED LAPAROSCOPIC RADICAL PROSTATECTOMY: SHX5141

## 2020-01-03 LAB — ABO/RH: ABO/RH(D): A POS

## 2020-01-03 LAB — GLUCOSE, CAPILLARY
Glucose-Capillary: 174 mg/dL — ABNORMAL HIGH (ref 70–99)
Glucose-Capillary: 301 mg/dL — ABNORMAL HIGH (ref 70–99)
Glucose-Capillary: 288 mg/dL — ABNORMAL HIGH (ref 70–99)
Glucose-Capillary: 303 mg/dL — ABNORMAL HIGH (ref 70–99)
Glucose-Capillary: 284 mg/dL — ABNORMAL HIGH (ref 70–99)

## 2020-01-03 LAB — HEMOGLOBIN A1C
Hgb A1c MFr Bld: 10.5 % — ABNORMAL HIGH (ref 4.8–5.6)
Mean Plasma Glucose: 254.65 mg/dL

## 2020-01-03 SURGERY — PROSTATECTOMY, RADICAL, ROBOT-ASSISTED, LAPAROSCOPIC
Anesthesia: General

## 2020-01-03 MED ORDER — MORPHINE SULFATE (PF) 2 MG/ML IV SOLN
2.0000 mg | INTRAVENOUS | Status: DC | PRN
  Administered 2020-01-03: 15:00:00 2 mg via INTRAVENOUS
  Filled 2020-01-03: qty 1

## 2020-01-03 MED ORDER — SPIRONOLACTONE 25 MG PO TABS
12.5000 mg | ORAL_TABLET | Freq: Every day | ORAL | Status: DC
  Administered 2020-01-03 – 2020-01-04 (×2): 12.5 mg via ORAL
  Filled 2020-01-03: qty 0.5
  Filled 2020-01-03 (×2): qty 1
  Filled 2020-01-03: qty 0.5

## 2020-01-03 MED ORDER — ZOLPIDEM TARTRATE 5 MG PO TABS: 5.0000 mg | ORAL_TABLET | Freq: Every evening | ORAL | Status: DC | PRN

## 2020-01-03 MED ORDER — PHENYLEPHRINE HCL (PRESSORS) 10 MG/ML IV SOLN
INTRAVENOUS | Status: AC
  Filled 2020-01-03: qty 1

## 2020-01-03 MED ORDER — FENTANYL CITRATE (PF) 100 MCG/2ML IJ SOLN
INTRAMUSCULAR | Status: DC | PRN
  Administered 2020-01-03 (×2): 25 ug via INTRAVENOUS
  Administered 2020-01-03 (×2): 50 ug via INTRAVENOUS

## 2020-01-03 MED ORDER — THROMBIN 5000 UNITS EX SOLR
CUTANEOUS | Status: AC
  Filled 2020-01-03: qty 5000

## 2020-01-03 MED ORDER — LACTATED RINGERS IV SOLN: INTRAVENOUS | Status: DC | PRN

## 2020-01-03 MED ORDER — PROMETHAZINE HCL 25 MG/ML IJ SOLN: 6.2500 mg | INTRAMUSCULAR | Status: DC | PRN

## 2020-01-03 MED ORDER — INSULIN ASPART 100 UNIT/ML ~~LOC~~ SOLN
SUBCUTANEOUS | Status: AC
  Filled 2020-01-03: qty 1

## 2020-01-03 MED ORDER — PROPOFOL 10 MG/ML IV BOLUS
INTRAVENOUS | Status: AC
  Filled 2020-01-03: qty 20

## 2020-01-03 MED ORDER — SODIUM CHLORIDE 0.9 % IV SOLN: INTRAVENOUS | Status: DC

## 2020-01-03 MED ORDER — DOCUSATE SODIUM 100 MG PO CAPS
100.0000 mg | ORAL_CAPSULE | Freq: Two times a day (BID) | ORAL | Status: DC
  Administered 2020-01-03 – 2020-01-04 (×3): 100 mg via ORAL
  Filled 2020-01-03 (×3): qty 1

## 2020-01-03 MED ORDER — MIDAZOLAM HCL 2 MG/2ML IJ SOLN
INTRAMUSCULAR | Status: DC | PRN
  Administered 2020-01-03: 2 mg via INTRAVENOUS

## 2020-01-03 MED ORDER — LIDOCAINE HCL (CARDIAC) PF 100 MG/5ML IV SOSY
PREFILLED_SYRINGE | INTRAVENOUS | Status: DC | PRN
  Administered 2020-01-03: 100 mg via INTRAVENOUS

## 2020-01-03 MED ORDER — MONTELUKAST SODIUM 10 MG PO TABS
10.0000 mg | ORAL_TABLET | Freq: Every day | ORAL | Status: DC
  Administered 2020-01-03: 10 mg via ORAL
  Filled 2020-01-03: qty 1

## 2020-01-03 MED ORDER — ACETAMINOPHEN 160 MG/5ML PO SOLN
325.0000 mg | ORAL | Status: DC | PRN
  Filled 2020-01-03: qty 20.3

## 2020-01-03 MED ORDER — INSULIN ASPART 100 UNIT/ML ~~LOC~~ SOLN
0.0000 [IU] | Freq: Three times a day (TID) | SUBCUTANEOUS | Status: DC
  Administered 2020-01-03 – 2020-01-04 (×3): 7 [IU] via SUBCUTANEOUS
  Administered 2020-01-04: 9 [IU] via SUBCUTANEOUS
  Filled 2020-01-03 (×3): qty 1

## 2020-01-03 MED ORDER — ROCURONIUM BROMIDE 100 MG/10ML IV SOLN
INTRAVENOUS | Status: DC | PRN
  Administered 2020-01-03: 10 mg via INTRAVENOUS
  Administered 2020-01-03: 20 mg via INTRAVENOUS
  Administered 2020-01-03: 50 mg via INTRAVENOUS
  Administered 2020-01-03: 20 mg via INTRAVENOUS

## 2020-01-03 MED ORDER — SUGAMMADEX SODIUM 500 MG/5ML IV SOLN
INTRAVENOUS | Status: DC | PRN
  Administered 2020-01-03: 200 mg via INTRAVENOUS

## 2020-01-03 MED ORDER — CEFAZOLIN SODIUM-DEXTROSE 2-4 GM/100ML-% IV SOLN
2.0000 g | INTRAVENOUS | Status: AC
  Administered 2020-01-03: 2 g via INTRAVENOUS

## 2020-01-03 MED ORDER — MEPERIDINE HCL 50 MG/ML IJ SOLN
INTRAMUSCULAR | Status: AC
  Administered 2020-01-03: 12.5 mg via INTRAVENOUS
  Filled 2020-01-03: qty 1

## 2020-01-03 MED ORDER — PROPOFOL 10 MG/ML IV BOLUS
INTRAVENOUS | Status: DC | PRN
  Administered 2020-01-03: 100 mg via INTRAVENOUS

## 2020-01-03 MED ORDER — ROCURONIUM BROMIDE 10 MG/ML (PF) SYRINGE
PREFILLED_SYRINGE | INTRAVENOUS | Status: AC
  Filled 2020-01-03: qty 10

## 2020-01-03 MED ORDER — SODIUM CHLORIDE 0.9 % IV SOLN
INTRAVENOUS | Status: DC | PRN
  Administered 2020-01-03: 25 ug/min via INTRAVENOUS

## 2020-01-03 MED ORDER — FENTANYL CITRATE (PF) 100 MCG/2ML IJ SOLN
INTRAMUSCULAR | Status: AC
  Administered 2020-01-03: 50 ug via INTRAVENOUS
  Filled 2020-01-03: qty 2

## 2020-01-03 MED ORDER — ONDANSETRON HCL 4 MG/2ML IJ SOLN
INTRAMUSCULAR | Status: DC | PRN
  Administered 2020-01-03: 4 mg via INTRAVENOUS

## 2020-01-03 MED ORDER — BELLADONNA ALKALOIDS-OPIUM 16.2-60 MG RE SUPP: 1.0000 | Freq: Four times a day (QID) | RECTAL | Status: DC | PRN

## 2020-01-03 MED ORDER — FENTANYL CITRATE (PF) 100 MCG/2ML IJ SOLN
25.0000 ug | INTRAMUSCULAR | Status: DC | PRN
  Administered 2020-01-03: 25 ug via INTRAVENOUS
  Administered 2020-01-03: 50 ug via INTRAVENOUS

## 2020-01-03 MED ORDER — ONDANSETRON HCL 4 MG/2ML IJ SOLN: 4.0000 mg | INTRAMUSCULAR | Status: DC | PRN

## 2020-01-03 MED ORDER — EPHEDRINE SULFATE 50 MG/ML IJ SOLN
INTRAMUSCULAR | Status: DC | PRN
  Administered 2020-01-03: 10 mg via INTRAVENOUS
  Administered 2020-01-03 (×2): 5 mg via INTRAVENOUS

## 2020-01-03 MED ORDER — CARVEDILOL 25 MG PO TABS
25.0000 mg | ORAL_TABLET | Freq: Two times a day (BID) | ORAL | Status: DC
  Administered 2020-01-03 – 2020-01-04 (×3): 25 mg via ORAL
  Filled 2020-01-03 (×3): qty 1

## 2020-01-03 MED ORDER — DEXAMETHASONE SODIUM PHOSPHATE 10 MG/ML IJ SOLN
INTRAMUSCULAR | Status: DC | PRN
  Administered 2020-01-03: 10 mg via INTRAVENOUS

## 2020-01-03 MED ORDER — ACETAMINOPHEN 325 MG PO TABS: 325.0000 mg | ORAL_TABLET | ORAL | Status: DC | PRN

## 2020-01-03 MED ORDER — SUCCINYLCHOLINE CHLORIDE 200 MG/10ML IV SOSY
PREFILLED_SYRINGE | INTRAVENOUS | Status: AC
  Filled 2020-01-03: qty 10

## 2020-01-03 MED ORDER — LORATADINE 10 MG PO TABS
10.0000 mg | ORAL_TABLET | Freq: Every day | ORAL | Status: DC
  Administered 2020-01-03 – 2020-01-04 (×2): 10 mg via ORAL
  Filled 2020-01-03 (×2): qty 1

## 2020-01-03 MED ORDER — INSULIN ASPART 100 UNIT/ML ~~LOC~~ SOLN
0.0000 [IU] | Freq: Every day | SUBCUTANEOUS | Status: DC
  Administered 2020-01-03: 3 [IU] via SUBCUTANEOUS
  Filled 2020-01-03: qty 1

## 2020-01-03 MED ORDER — LIDOCAINE HCL (PF) 2 % IJ SOLN
INTRAMUSCULAR | Status: AC
  Filled 2020-01-03: qty 5

## 2020-01-03 MED ORDER — MEPERIDINE HCL 50 MG/ML IJ SOLN
12.5000 mg | INTRAMUSCULAR | Status: DC | PRN
  Administered 2020-01-03: 12.5 mg via INTRAVENOUS

## 2020-01-03 MED ORDER — BUPIVACAINE HCL (PF) 0.5 % IJ SOLN
INTRAMUSCULAR | Status: AC
  Filled 2020-01-03: qty 30

## 2020-01-03 MED ORDER — MIDAZOLAM HCL 2 MG/2ML IJ SOLN
INTRAMUSCULAR | Status: AC
  Filled 2020-01-03: qty 2

## 2020-01-03 MED ORDER — OXYBUTYNIN CHLORIDE 5 MG PO TABS
5.0000 mg | ORAL_TABLET | Freq: Three times a day (TID) | ORAL | Status: DC | PRN
  Administered 2020-01-03: 5 mg via ORAL
  Filled 2020-01-03: qty 1

## 2020-01-03 MED ORDER — HYDROCODONE-ACETAMINOPHEN 7.5-325 MG PO TABS: 1.0000 | ORAL_TABLET | Freq: Once | ORAL | Status: DC | PRN

## 2020-01-03 MED ORDER — DIPHENHYDRAMINE HCL 12.5 MG/5ML PO ELIX
12.5000 mg | ORAL_SOLUTION | Freq: Four times a day (QID) | ORAL | Status: DC | PRN
  Filled 2020-01-03: qty 5

## 2020-01-03 MED ORDER — DIGOXIN 125 MCG PO TABS
125.0000 ug | ORAL_TABLET | ORAL | Status: DC
  Administered 2020-01-04: 125 ug via ORAL
  Filled 2020-01-03: qty 1

## 2020-01-03 MED ORDER — BUPIVACAINE HCL 0.5 % IJ SOLN
INTRAMUSCULAR | Status: DC | PRN
  Administered 2020-01-03: 30 mL

## 2020-01-03 MED ORDER — LOSARTAN POTASSIUM 50 MG PO TABS
100.0000 mg | ORAL_TABLET | Freq: Every day | ORAL | Status: DC
  Administered 2020-01-03: 100 mg via ORAL
  Filled 2020-01-03: qty 2

## 2020-01-03 MED ORDER — FENTANYL CITRATE (PF) 100 MCG/2ML IJ SOLN
INTRAMUSCULAR | Status: AC
  Filled 2020-01-03: qty 2

## 2020-01-03 MED ORDER — FUROSEMIDE 20 MG PO TABS
20.0000 mg | ORAL_TABLET | Freq: Two times a day (BID) | ORAL | Status: DC
  Administered 2020-01-03 – 2020-01-04 (×2): 20 mg via ORAL
  Filled 2020-01-03 (×2): qty 1

## 2020-01-03 MED ORDER — FENTANYL CITRATE (PF) 100 MCG/2ML IJ SOLN
INTRAMUSCULAR | Status: AC
  Administered 2020-01-03: 25 ug via INTRAVENOUS
  Filled 2020-01-03: qty 2

## 2020-01-03 MED ORDER — DIPHENHYDRAMINE HCL 50 MG/ML IJ SOLN: 12.5000 mg | Freq: Four times a day (QID) | INTRAMUSCULAR | Status: DC | PRN

## 2020-01-03 MED ORDER — OXYCODONE-ACETAMINOPHEN 5-325 MG PO TABS
1.0000 | ORAL_TABLET | ORAL | Status: DC | PRN
  Administered 2020-01-03 – 2020-01-04 (×3): 2 via ORAL
  Filled 2020-01-03 (×3): qty 2

## 2020-01-03 MED ORDER — CEFAZOLIN SODIUM-DEXTROSE 1-4 GM/50ML-% IV SOLN
1.0000 g | Freq: Three times a day (TID) | INTRAVENOUS | Status: AC
  Administered 2020-01-03: 1 g via INTRAVENOUS
  Filled 2020-01-03 (×2): qty 50

## 2020-01-03 MED ORDER — HEPARIN SODIUM (PORCINE) 5000 UNIT/ML IJ SOLN
5000.0000 [IU] | Freq: Three times a day (TID) | INTRAMUSCULAR | Status: DC
  Administered 2020-01-03 – 2020-01-04 (×3): 5000 [IU] via SUBCUTANEOUS
  Filled 2020-01-03 (×3): qty 1

## 2020-01-03 MED ORDER — ALBUTEROL SULFATE (2.5 MG/3ML) 0.083% IN NEBU: 3.0000 mL | INHALATION_SOLUTION | RESPIRATORY_TRACT | Status: DC | PRN

## 2020-01-03 MED ORDER — CEFAZOLIN SODIUM-DEXTROSE 2-4 GM/100ML-% IV SOLN
INTRAVENOUS | Status: AC
  Filled 2020-01-03: qty 100

## 2020-01-03 MED ORDER — INSULIN GLARGINE 100 UNIT/ML ~~LOC~~ SOLN
30.0000 [IU] | Freq: Every day | SUBCUTANEOUS | Status: DC
  Administered 2020-01-03: 30 [IU] via SUBCUTANEOUS
  Filled 2020-01-03 (×2): qty 0.3

## 2020-01-03 MED ORDER — GLYCOPYRROLATE 0.2 MG/ML IJ SOLN
INTRAMUSCULAR | Status: DC | PRN
  Administered 2020-01-03: .2 mg via INTRAVENOUS

## 2020-01-03 MED ORDER — ACETAMINOPHEN 325 MG PO TABS: 650.0000 mg | ORAL_TABLET | ORAL | Status: DC | PRN

## 2020-01-03 MED ORDER — INSULIN ASPART 100 UNIT/ML ~~LOC~~ SOLN
4.0000 [IU] | Freq: Three times a day (TID) | SUBCUTANEOUS | Status: DC
  Administered 2020-01-03 – 2020-01-04 (×3): 4 [IU] via SUBCUTANEOUS
  Filled 2020-01-03 (×3): qty 1

## 2020-01-03 MED ORDER — PANTOPRAZOLE SODIUM 40 MG PO TBEC: 40.0000 mg | DELAYED_RELEASE_TABLET | ORAL | Status: DC | PRN

## 2020-01-03 MED ORDER — FLUTICASONE PROPIONATE 50 MCG/ACT NA SUSP
2.0000 | Freq: Every day | NASAL | Status: DC | PRN
  Filled 2020-01-03: qty 16

## 2020-01-03 MED ORDER — PHENYLEPHRINE HCL (PRESSORS) 10 MG/ML IV SOLN
INTRAVENOUS | Status: DC | PRN
  Administered 2020-01-03: 100 ug via INTRAVENOUS
  Administered 2020-01-03 (×2): 50 ug via INTRAVENOUS
  Administered 2020-01-03 (×2): 200 ug via INTRAVENOUS
  Administered 2020-01-03: 100 ug via INTRAVENOUS

## 2020-01-03 SURGICAL SUPPLY — 92 items
ANCHOR TIS RET SYS 235ML (MISCELLANEOUS) ×2 IMPLANT
APPLICATOR SURGIFLO ENDO (HEMOSTASIS) ×2 IMPLANT
APPLIER CLIP LOGIC TI 5 (MISCELLANEOUS) IMPLANT
BAG URINE DRAIN 2000ML AR STRL (UROLOGICAL SUPPLIES) ×2 IMPLANT
BLADE CLIPPER SURG (BLADE) ×2 IMPLANT
BULB RESERV EVAC DRAIN JP 100C (MISCELLANEOUS) IMPLANT
CANISTER SUCT 1200ML W/VALVE (MISCELLANEOUS) ×2 IMPLANT
CATH DRAINAGE MALECOT 26FR (CATHETERS) ×1 IMPLANT
CATH FOL 2WAY LX 18X5 (CATHETERS) ×4 IMPLANT
CATH MALECOT (CATHETERS) ×2
CHLORAPREP W/TINT 26 (MISCELLANEOUS) ×4 IMPLANT
CLIP VESOLOCK LG 6/CT PURPLE (CLIP) ×8 IMPLANT
COVER TIP SHEARS 8 DVNC (MISCELLANEOUS) ×1 IMPLANT
COVER TIP SHEARS 8MM DA VINCI (MISCELLANEOUS) ×1
COVER WAND RF STERILE (DRAPES) ×2 IMPLANT
DEFOGGER SCOPE WARMER CLEARIFY (MISCELLANEOUS) ×2 IMPLANT
DERMABOND ADVANCED (GAUZE/BANDAGES/DRESSINGS) ×1
DERMABOND ADVANCED .7 DNX12 (GAUZE/BANDAGES/DRESSINGS) ×1 IMPLANT
DRAIN CHANNEL JP 15F RND 16 (MISCELLANEOUS) IMPLANT
DRAIN CHANNEL JP 19F (MISCELLANEOUS) IMPLANT
DRAPE 3/4 80X56 (DRAPES) ×2 IMPLANT
DRAPE COLUMN DVNC XI (DISPOSABLE) ×1 IMPLANT
DRAPE DA VINCI XI COLUMN (DISPOSABLE) ×1
DRAPE LEGGINS SURG 28X43 STRL (DRAPES) ×2 IMPLANT
DRAPE SURG 17X11 SM STRL (DRAPES) ×8 IMPLANT
DRAPE UNDER BUTTOCK W/FLU (DRAPES) ×2 IMPLANT
DRSG TELFA 3X8 NADH (GAUZE/BANDAGES/DRESSINGS) ×2 IMPLANT
ELECT REM PT RETURN 9FT ADLT (ELECTROSURGICAL) ×2
ELECTRODE REM PT RTRN 9FT ADLT (ELECTROSURGICAL) ×1 IMPLANT
GLOVE BIO SURGEON STRL SZ 6.5 (GLOVE) ×4 IMPLANT
GLOVE BIOGEL PI IND STRL 7.5 (GLOVE) ×1 IMPLANT
GLOVE BIOGEL PI INDICATOR 7.5 (GLOVE) ×1
GOWN STRL REUS W/ TWL LRG LVL3 (GOWN DISPOSABLE) ×2 IMPLANT
GOWN STRL REUS W/ TWL XL LVL3 (GOWN DISPOSABLE) ×1 IMPLANT
GOWN STRL REUS W/TWL LRG LVL3 (GOWN DISPOSABLE) ×2
GOWN STRL REUS W/TWL XL LVL3 (GOWN DISPOSABLE) ×1
GRASPER SUT TROCAR 14GX15 (MISCELLANEOUS) ×2 IMPLANT
HEMOSTAT SURGICEL 2X14 (HEMOSTASIS) IMPLANT
HOLDER FOLEY CATH W/STRAP (MISCELLANEOUS) ×2 IMPLANT
IRRIGATION STRYKERFLOW (MISCELLANEOUS) ×1 IMPLANT
IRRIGATOR STRYKERFLOW (MISCELLANEOUS) ×2
IV LACTATED RINGERS 1000ML (IV SOLUTION) ×2 IMPLANT
KIT PINK PAD W/HEAD ARE REST (MISCELLANEOUS) ×2
KIT PINK PAD W/HEAD ARM REST (MISCELLANEOUS) ×1 IMPLANT
LABEL OR SOLS (LABEL) ×2 IMPLANT
MARKER SKIN DUAL TIP RULER LAB (MISCELLANEOUS) ×2 IMPLANT
NDL INSUFFLATION 14GA 120MM (NEEDLE) ×1 IMPLANT
NEEDLE HYPO 22GX1.5 SAFETY (NEEDLE) ×2 IMPLANT
NEEDLE INSUFFLATION 14GA 120MM (NEEDLE) ×2 IMPLANT
NS IRRIG 500ML POUR BTL (IV SOLUTION) ×2 IMPLANT
OBTURATOR OPTICAL STANDARD 8MM (TROCAR) ×1
OBTURATOR OPTICAL STND 8 DVNC (TROCAR) ×1
OBTURATOR OPTICALSTD 8 DVNC (TROCAR) ×1 IMPLANT
PACK LAP CHOLECYSTECTOMY (MISCELLANEOUS) ×2 IMPLANT
PAD DRESSING TELFA 3X8 NADH (GAUZE/BANDAGES/DRESSINGS) ×1 IMPLANT
PENCIL ELECTRO HAND CTR (MISCELLANEOUS) ×2 IMPLANT
RELOAD STAPLE 60 2.6 WHT THN (STAPLE) IMPLANT
RELOAD STAPLER WHITE 60MM (STAPLE) ×1 IMPLANT
SEAL CANN UNIV 5-8 DVNC XI (MISCELLANEOUS) ×4 IMPLANT
SEAL XI 5MM-8MM UNIVERSAL (MISCELLANEOUS) ×4
SET CYSTO W/LG BORE CLAMP LF (SET/KITS/TRAYS/PACK) ×1 IMPLANT
SET TUBE SMOKE EVAC HIGH FLOW (TUBING) ×2 IMPLANT
SOLUTION ELECTROLUBE (MISCELLANEOUS) ×2 IMPLANT
SPOGE SURGIFLO 8M (HEMOSTASIS) ×1
SPONGE LAP 4X18 RFD (DISPOSABLE) ×2 IMPLANT
SPONGE SURGIFLO 8M (HEMOSTASIS) ×1 IMPLANT
SPONGE VERSALON 4X4 4PLY (MISCELLANEOUS) IMPLANT
STAPLE ECHEON FLEX 60 POW ENDO (STAPLE) ×1 IMPLANT
STAPLER RELOAD WHITE 60MM (STAPLE) ×2
STAPLER SKIN PROX 35W (STAPLE) ×2 IMPLANT
STRAP SAFETY 5IN WIDE (MISCELLANEOUS) ×4 IMPLANT
SURGILUBE 2OZ TUBE FLIPTOP (MISCELLANEOUS) ×2 IMPLANT
SUT DVC VLOC 90 3-0 CV23 UNDY (SUTURE) ×4 IMPLANT
SUT DVC VLOC 90 3-0 CV23 VLT (SUTURE) ×2
SUT ETHILON 3-0 FS-10 30 BLK (SUTURE)
SUT MNCRL 4-0 (SUTURE) ×2
SUT MNCRL 4-0 27XMFL (SUTURE) ×2
SUT SILK 2 0 SH (SUTURE) IMPLANT
SUT VIC AB 0 CT1 36 (SUTURE) ×4 IMPLANT
SUT VIC AB 2-0 SH 27 (SUTURE)
SUT VIC AB 2-0 SH 27XBRD (SUTURE) IMPLANT
SUT VICRYL 0 AB UR-6 (SUTURE) ×2 IMPLANT
SUTURE DVC VLC 90 3-0 CV23 VLT (SUTURE) ×1 IMPLANT
SUTURE EHLN 3-0 FS-10 30 BLK (SUTURE) IMPLANT
SUTURE MNCRL 4-0 27XMF (SUTURE) ×2 IMPLANT
SYR 10ML LL (SYRINGE) ×2 IMPLANT
SYR BULB IRRIG 60ML STRL (SYRINGE) ×2 IMPLANT
SYR TOOMEY 50ML (SYRINGE) ×2 IMPLANT
TAPE CLOTH 3X10 WHT NS LF (GAUZE/BANDAGES/DRESSINGS) ×4 IMPLANT
TROCAR ENDOPATH XCEL 12X100 BL (ENDOMECHANICALS) ×2 IMPLANT
TROCAR XCEL 12X100 BLDLESS (ENDOMECHANICALS) ×2 IMPLANT
TROCAR XCEL NON-BLD 5MMX100MML (ENDOMECHANICALS) ×2 IMPLANT

## 2020-01-03 NOTE — Interval H&P Note (Signed)
H&P up-to-date  Regular rate and rhythm Clear to auscultation bilaterally  Small amount of microscopic blood in preop UA.  Staging CT scan performed in form of CT urogram which was fairly unremarkable.  We will also plan for flexible cystoscopy prior to prostatectomy this a.m.  All questions answered.

## 2020-01-03 NOTE — Anesthesia Procedure Notes (Signed)
Procedure Name: Intubation Date/Time: 01/03/2020 7:55 AM Performed by: Kelton Pillar, CRNA Pre-anesthesia Checklist: Patient identified, Emergency Drugs available, Suction available and Patient being monitored Patient Re-evaluated:Patient Re-evaluated prior to induction Oxygen Delivery Method: Circle system utilized Preoxygenation: Pre-oxygenation with 100% oxygen Induction Type: IV induction Ventilation: Mask ventilation without difficulty Laryngoscope Size: Mac and 4 Grade View: Grade II Tube type: Oral Number of attempts: 1 Airway Equipment and Method: Stylet and Oral airway Placement Confirmation: ETT inserted through vocal cords under direct vision,  positive ETCO2 and breath sounds checked- equal and bilateral Tube secured with: Tape Dental Injury: Teeth and Oropharynx as per pre-operative assessment

## 2020-01-03 NOTE — Anesthesia Preprocedure Evaluation (Addendum)
Anesthesia Evaluation   Patient awake    Reviewed: Allergy & Precautions, H&P , NPO status , reviewed documented beta blocker date and time   Airway Mallampati: II  TM Distance: >3 FB Neck ROM: full    Dental  (+) Chipped   Pulmonary shortness of breath, asthma , sleep apnea , pneumonia,  Does not use CPAP, counseled re GA/OSA. Will require monitoring overnight   Pulmonary exam normal        Cardiovascular hypertension, + CAD, + Past MI and +CHF  Normal cardiovascular exam+ Cardiac Defibrillator   2018 ECHO Study Conclusions   - Left ventricle: The cavity size was mildly dilated. Wall  thickness was increased in a pattern of moderate LVH. Systolic  function was severely reduced. The estimated ejection fraction  was in the range of 25% to 30%. There is thinning and  akinesis/dyskinesis of the anterior wall, anteroseptal wall, and  apex. Doppler parameters are consistent with restrictive  physiology, indicative of decreased left ventricular diastolic  compliance and/or increased left atrial pressure.  - Mitral valve: There was mild to moderate regurgitation.  - Left atrium: The atrium was mildly dilated.  - Right ventricle: The cavity size was normal. Systolic function  was normal.  - Pericardium, extracardiac: A small pericardial effusion was  identified posterior to the heart.   CATH: Conclusions: 1. No high-grade coronary artery lesion to explain EKG changes and acute respiratory distress. There is moderate in-stent restenosis of the ostial/proximal LAD stent, as well as mild to moderate disease involving small jailed D1 and mid LAD. 2. Severely reduced LV dysfunction with LVEF of approximately 25-30%. 3. Mildly elevated left ventricular filling pressure, though evaluation is limited by significant respiratory variation   Neuro/Psych Seizures -,   Neuromuscular disease    GI/Hepatic GERD  Controlled,   Endo/Other  diabetes  Renal/GU      Musculoskeletal   Abdominal   Peds  Hematology   Anesthesia Other Findings Pulmonary and cardiac clearance obtained per chart/pt  Past Medical History: 2011: AICD (automatic cardioverter/defibrillator) present No date: Asthma No date: Cancer [facility])     Comment:  prostate No date: CHF (congestive heart failure) ([facility]) No date: Coronary artery disease No date: Diabetes mellitus without complication ([facility]) No date: Dyspnea No date: ED (erectile dysfunction) No date: GERD (gastroesophageal reflux disease) No date: Hyperlipidemia No date: Hypertension No date: Ischemic cardiomyopathy No date: LADA (latent autoimmune diabetes in adults), managed as type  1 ([place]) 2011: Myocardial infarction ([place])     Comment:  anterior MI at [place] s/p BMS to ostial/proximal LAD No date: Neuromuscular disorder ([place])     Comment:  problems in arms. finger tips have been numb/tingling.               voltaren works 2018: Obstructive sleep apnea     Comment:  cpap. has not used lately. uses a breathe-right strip on              nose No date: Seizures ([place])     Comment:  diabetes seizures (low blood sugar); last one [id number] No date: Systolic heart failure ([place]) No date: Vitamin D deficiency Past Surgical History: 08/30/2010: CARDIAC DEFIBRILLATOR PLACEMENT 11/07/2017: CHOLECYSTECTOMY; N/A     Comment:  Procedure: LAPAROSCOPIC CHOLECYSTECTOMY;  Surgeon:               Herbert Pun, MD;  Location: [facility] ORS;  Service:              General;  Laterality:  N/A; 2011: CORONARY ANGIOPLASTY     Comment:  stent placed x 1 03/20/2018: ESOPHAGOGASTRODUODENOSCOPY (EGD) WITH PROPOFOL; N/A     Comment:  Procedure: ESOPHAGOGASTRODUODENOSCOPY (EGD) WITH               PROPOFOL;  Surgeon: Manya Silvas, MD;  Location:               [facility] ENDOSCOPY;  Service: Endoscopy;  Laterality: N/A; 11/23/2016: LEFT HEART CATH AND CORONARY ANGIOGRAPHY; N/A     Comment:  Procedure:  Left Heart Cath and Coronary Angiography;                Surgeon: Nelva Bush, MD;  Location: [place] CV              LAB;  Service: Cardiovascular;  Laterality: N/A; 2019: PROSTATE BIOPSY 10/23/2019: PROSTATE BIOPSY; N/A     Comment:  Procedure: PROSTATE BIOPSY;  Surgeon: Abbie Sons,               MD;  Location: [facility] ORS;  Service: Urology;  Laterality:               N/A; 10/23/2019: TRANSRECTAL ULTRASOUND; N/A     Comment:  Procedure: TRANSRECTAL ULTRASOUND;  Surgeon: Abbie Sons, MD;  Location: [facility] ORS;  Service: Urology;                Laterality: N/A; 2008: WRIST SURGERY; Right   Reproductive/Obstetrics                          Anesthesia Physical Anesthesia Plan  ASA: IV  Anesthesia Plan: General   Post-op Pain Management:    Induction: Intravenous  PONV Risk Score and Plan: Ondansetron and Treatment may vary due to age or medical condition  Airway Management Planned: Oral ETT  Additional Equipment:   Intra-op Plan:   Post-operative Plan: Extubation in OR  Informed Consent: I have reviewed the patients History and Physical, chart, labs and discussed the procedure including the risks, benefits and alternatives for the proposed anesthesia with the patient or authorized representative who has indicated his/her understanding and acceptance.     Dental Advisory Given  Plan Discussed with: CRNA  Anesthesia Plan Comments:        Anesthesia Quick Evaluation

## 2020-01-03 NOTE — Transfer of Care (Signed)
Immediate Anesthesia Transfer of Care Note  Patient: Joseph [name]  Procedure(s) Performed: XI ROBOTIC ASSISTED LAPAROSCOPIC RADICAL PROSTATECTOMY (N/A ) CYSTOSCOPY FLEXIBLE (N/A )  Patient Location: PACU  Anesthesia Type:General  Level of Consciousness: awake, drowsy and patient cooperative  Airway & Oxygen Therapy: Patient Spontanous Breathing and Patient connected to face mask oxygen  Post-op Assessment: Report given to RN and Post -op Vital signs reviewed and stable  Post vital signs: Reviewed and stable  Last Vitals:  Vitals Value Taken Time  BP 126/72 01/03/20 1118  Temp    Pulse 86 01/03/20 1124  Resp 19 01/03/20 1124  SpO2 97 % 01/03/20 1124  Vitals shown include unvalidated device data.  Last Pain:  Vitals:   01/03/20 0621  TempSrc: Temporal  PainSc: 0-No pain         Complications: No apparent anesthesia complications

## 2020-01-03 NOTE — Progress Notes (Signed)
Inpatient Diabetes Program Recommendations  AACE/ADA: New Consensus Statement on Inpatient Glycemic Control (2015)  Target Ranges:  Prepandial:   less than 140 mg/dL      Peak postprandial:   less than 180 mg/dL (1-2 hours)      Critically ill patients:  140 - 180 mg/dL   Lab Results  Component Value Date   GLUCAP 288 (H) 01/03/2020   HGBA1C 11.2 (H) 11/24/2016   Results for STOKES, NASTRI (MRN [id number]) as of 01/03/2020 13:43  Ref. Range 01/03/2020 06:19 01/03/2020 11:25 01/03/2020 13:16  Glucose-Capillary Latest Ref Range: 70 - 99 mg/dL 174 (H) 301 (H) 288 (H)    Review of Glycemic Control  Diabetes history: Type 2? Outpatient Diabetes medications: Toujeo 44 units every am, Humalog 18 units at breakfast, 20 units at lunch, 22 units at supper Current orders for Inpatient glycemic control: Novolog SENSITIVE TID  Inpatient Diabetes Program Recommendations:   Noted in surgery now. If patient is admitted, will need to adjust insulin as needed. Recommend starting with Lantus 22 units daily,(1/2 home dose of basal)  Novolog RESISTANT correction scale TID and HS if eating. Titrate dosages as needed.  Will continue to monitor blood sugars while in the hospital.  Harvel Ricks RN BSN CDE Diabetes Coordinator Pager: [phone]  8am-5pm

## 2020-01-03 NOTE — Op Note (Signed)
01/03/20  PREOPERATIVE DIAGNOSIS: Prostate cancer.  POSTOPERATIVE DIAGNOSIS: Prostate cancer.  OPERATION PERFORMED: 1. DaVinci laparoscopic radical prostatectomy (bilateral nerve sparing) 2 DaVinci laproscopic bilateral pelvic lymph node dissection. 3. Flexible cystoscopy  SURGEON: Hollice Espy, MD  ASSISTANTS: Ronalee Belts, MD  ANESTHESIA: General.  EBL: 100 cc  SPECIMEN: Prostate with bilateral seminal vesicals, bilateral pelvic lymph nodes, anterior fat pad.  FINDINGS: Accessory Pudendal Vessel: none  INDICATION: Pt.is a 49 year old male with Gleason 3+4 prostate cancer. Treatment options were discussed with him at length and he chose DaVinci radical prostatectomy.  Poor baseline erections but given favorable intermediate risk disease therefore plan for a least nerve sparing was discussed. Bilateral pelvic lymph node dissection was planned due to his risk stratification.    PROCEDURE IN DETAIL: Patient was given appropriate perioperative antibiotics. He had sequential compression devices applied preoperatively for DVT prophylaxis. He was taken to the operating room where he was induced with general anesthesia. After adequate anesthesia, the penis was prepped using Betadine and he was draped.  Using a flexible cystoscope, the penis was intubated and the urethra was inspected which was normal.  He had some very mild bilobar coaptation with a normal bladder neck.  The bladder was carefully inspected and noted to have no tumors, lesions, or masses.  Bilateral UOs were identified.  On retroflexion, no median lobe.  Scope was then removed.   He was then repositioned in the  in the dorsal lithotomy position. His arms were draped by his side and was appropriately padded and secured to the operating room table. He was placed in the Trendelenburg position.  He was prepped and draped in sterile fashion. An 61 French Foley was placed in the bladder and instilled with 15  cc sterile water. Orogastric tube was placed. The Veress needle was passed just above the umbilicus and the abdomen was insufflated to 15 atmospheres. An 8 mm, blunt-tip trocar was placed just above the umbilicus. The zero-degree camera was passed within this and the following trocars were placed under direct vision; 8 mm robotic trocars were placed 9 cm laterally and inferiorly to the initially placed umbilical trocar. A third one was placed 7 cm lateral to the left-sided trocar. In the corresponding position on the right side, a 12 mm trocar was placed, and then a 5 mm trocar was placed to the right and well above the umbilicus.  The 12 mm assistant port site was preclosed using 0 Vicryl suture using a Carter-Thomason which was tied down at the end of the case to close this port site.  The robot was then docked with the robot trocar. I used the zero-degree camera. I had the hot scissors in the right hand and the left hand with the [place] bipolar and far left hand the Prograsp forceps. Initially I divided the median umbilical ligament bilaterally and the urachus and developed the space of Retzius down to pubic bone. I divided the parietal peritoneum laterally up to the vas deferens on each side. I used the prograsp forceps to provide cranial traction on the urachus. I cleaned off the Endopelvic fascia on each side and then divided it with the scissors laterally to the perirectal fat and medially to the puboprostatic ligaments which were divided. I then ligated the dorsal vein complex using a 60 mm vascular load stapler.   I then addressed the bladder neck with a 30-degree down lens. I identified the bladder neck by pulling on the Foley catheter. I divided the anterior bladder neck musculature  until I then found the anterior bladder neck mucosa which was incised. I identified the Foley catheter within, deflated the balloon, pulled the Foley out through this opening and then using  the Carter-Thomason needle with a #0-Vicryl suture, passed through The suprapubic region and pulled the suture through the eye of the Foley and then back out. This allowed me to provide upward traction on the prostate. I then divided the lateral bladder neck mucosa and the posterior bladder neck mucosa. I was well away from ureteral orifices. I divided the posterior bladder neck musculature until I identified the vas deferens. They were freed proximally, then divided. I freed up the seminal vesicals using blunt and sharp dissection. Judicious use of electrocautery was used near the seminal vesicle tips to avoid injury to neurovascular bundle.   I then went back to the 0-degree lens. I divided the Denonvilliers fascia beneath the prostate and developed the prostate off the rectum. I then did a bilateral nerve sparing by  creating a plane which was just overlying the capsule sparing the neurovascular veil overlying the capsule. I then isolated the pedicles of the prostate and placed weck clips on the pedicles of the prostate and then divided it with cold scissors. I continued to divide the neurovascular bundles off the prostate out to the apex of the prostate. At this point the prostate was freed up except for the urethra. I addressed the prostate anteriorly, divided the dorsal vein , then the anterior urethral wall, pulled the Foley catheter back and then divided the posterior urethral wall. Specimen was completely freed up. I placed the prostate in an Endo catch bag and then placed the bag in the upper abdomen out of the way. I then irrigated the pelvis. The rectal test was negative. There was reasonable hemostasis.  I then did the pelvic lymph node dissection by incising the fascia overlying the right external iliac vein, dissecting distally. I went just distal to the node of [place]et where we placed clips and then divided the lymphatics. The lateral aspect of the dissection was the  pelvic side wall, inferior was the obturator nerve and proximal the hypogastric vessels. I placed clips at the proximal aspect and then divided the lymphatics. This was removed with the spoon grasper and sent to pathology.   I then did the left obturator lymph node dissection in the same fashion as the left side.  With good hemostasis, I then did the posterior reconstruction. I used a 3-0 VLoc suture through the cut edge of Denonvilliers fascia beneath the bladder on the right side and through the posterior striated sphincter underneath the urethra. This brought the bladder neck and urethra and closer proximity to help facilitate anastomosis.   I then did the urethral vesicle anastomosis again with two 3-0 VLoc sutures interlocked. I passed both ends of the suture from the outside-in through the bladder neck at the 6 o'clock position. I passed both through the urethral stump from the inside-out in the corresponding position. I reapproximated the bladder neck to the urethra. I then ran the Left suture on the left side anastomosis to the 9 o'clock position. Then I went back to the right sided suture and ran that up the right side to the 12 o'clock position. I then continued the left suture to the 12 o'clock position. The suture was then suspended anteriorly behind the pubic bone.   I then placed a new 25 French Foley into the bladder and filled it with 10 cc sterile water.  I irrigated the bladder with 160 cc. There was no leakage. There was reasonable hemostasis.  Surgicel was used on either side of the pedicles for an additional hemostasis.  Surgi-Flo was also used. The instruments were then removed. The robot was undocked and all the trocars were removed under direct vision. There was good hemostasis. I then enlarged the umbilical trocar site large enough to remove the prostate and I closed the fascia here with #0-0 Vicryl suture in a running fashion. All the port sites were  irrigated. Lidocaine was injected into all the trocar sites. The skin was closed with 4-0 Monocryl in running subcuticular fashion. Dermabond was applied.   At this point patient was awakened and extubated in the operating room and taken to the recovery room in stable condition. There were no complications. All counts correct.  Hollice Espy, MD

## 2020-01-03 NOTE — Anesthesia Postprocedure Evaluation (Signed)
Anesthesia Post Note  Patient: Joseph [name]  Procedure(s) Performed: XI ROBOTIC ASSISTED LAPAROSCOPIC RADICAL PROSTATECTOMY (N/A ) CYSTOSCOPY FLEXIBLE (N/A )  Patient location during evaluation: PACU Anesthesia Type: General Level of consciousness: awake and alert Pain management: pain level controlled Vital Signs Assessment: post-procedure vital signs reviewed and stable Respiratory status: spontaneous breathing, nonlabored ventilation, respiratory function stable and patient connected to nasal cannula oxygen Cardiovascular status: blood pressure returned to baseline and stable Postop Assessment: no apparent nausea or vomiting Anesthetic complications: no     Last Vitals:  Vitals:   01/03/20 1318 01/03/20 1332  BP: 130/78 116/74  Pulse: 86 81  Resp: 10 18  Temp:  36.9 C  SpO2: 96% 97%    Last Pain:  Vitals:   01/03/20 1332  TempSrc: Oral  PainSc:                  Alphonsus Sias

## 2020-01-03 NOTE — Progress Notes (Signed)
Results for LANNEY, CARRITHERS (MRN [id number]) as of 01/03/2020 15:11  Ref. Range 01/03/2020 11:25 01/03/2020 13:16  Glucose-Capillary Latest Ref Range: 70 - 99 mg/dL 301 (H) 288 (H)  Diabetes history: Type 2? Outpatient Diabetes medications: Toujeo 44 units every am, Humalog 18 units at breakfast, 20 units at lunch, 22 units at supper Current orders for Inpatient glycemic control: Novolog SENSITIVE TID with meals and HS, Novolog 4 units tid with meals  Recommendations:    Notified MD of need for patient to have orders for his basal insulin.  Orders received to add Lantus 30 units daily.  Will follow.   Thanks  Adah Perl, RN, BC-ADM Inpatient Diabetes Coordinator Pager [phone] (8a-5p)

## 2020-01-03 NOTE — Progress Notes (Signed)
Gave 150 Fentanyl patient continuously falling asleep and oxygen levels dropping. I put him on [place]anchard @ 3L. Called Dr. Lavone Neri to see what else I can give to the patient for pain and he stated Demerol 12.5 q48min PRN up to 50. I gave 25 and the patient is still falling asleep continuously. Since patient is sleeping well with no signs of distress I will take him to the department.

## 2020-01-04 DIAGNOSIS — C61 Malignant neoplasm of prostate: Secondary | ICD-10-CM

## 2020-01-04 LAB — BASIC METABOLIC PANEL
Anion gap: 7 (ref 5–15)
BUN: 19 mg/dL (ref 6–20)
CO2: 25 mmol/L (ref 22–32)
Calcium: 8.1 mg/dL — ABNORMAL LOW (ref 8.9–10.3)
Chloride: 104 mmol/L (ref 98–111)
Creatinine, Ser: 0.9 mg/dL (ref 0.61–1.24)
GFR calc Af Amer: >60 mL/min (ref >60)
GFR calc non Af Amer: >60 mL/min (ref >60)
Glucose, Bld: 394 mg/dL — ABNORMAL HIGH (ref 70–99)
Potassium: 4.2 mmol/L (ref 3.5–5.1)
Sodium: 136 mmol/L (ref 135–145)

## 2020-01-04 LAB — CBC
HCT: 43 % (ref 39.0–52.0)
Hemoglobin: 14.5 g/dL (ref 13.0–17.0)
MCH: 30.7 pg (ref 26.0–34.0)
MCHC: 33.7 g/dL (ref 30.0–36.0)
MCV: 90.9 fL (ref 80.0–100.0)
Platelets: 155 10*3/uL (ref 150–400)
RBC: 4.73 MIL/uL (ref 4.22–5.81)
RDW: 12.1 % (ref 11.5–15.5)
WBC: 12.5 10*3/uL — ABNORMAL HIGH (ref 4.0–10.5)
nRBC: 0 % (ref 0.0–0.2)

## 2020-01-04 LAB — GLUCOSE, CAPILLARY
Glucose-Capillary: 339 mg/dL — ABNORMAL HIGH (ref 70–99)
Glucose-Capillary: 368 mg/dL — ABNORMAL HIGH (ref 70–99)

## 2020-01-04 MED ORDER — OXYCODONE-ACETAMINOPHEN 5-325 MG PO TABS: 1.0000 | ORAL_TABLET | Freq: Four times a day (QID) | ORAL | 0 refills | Status: DC | PRN

## 2020-01-04 MED ORDER — OXYBUTYNIN CHLORIDE 5 MG PO TABS: 5.0000 mg | ORAL_TABLET | Freq: Three times a day (TID) | ORAL | 0 refills | Status: DC | PRN

## 2020-01-04 MED ORDER — CHLORHEXIDINE GLUCONATE CLOTH 2 % EX PADS
6.0000 | MEDICATED_PAD | Freq: Every day | CUTANEOUS | Status: DC
  Administered 2020-01-04: 6 via TOPICAL

## 2020-01-04 MED ORDER — DOCUSATE SODIUM 100 MG PO CAPS: 100.0000 mg | ORAL_CAPSULE | Freq: Two times a day (BID) | ORAL | 0 refills | Status: DC

## 2020-01-04 NOTE — Progress Notes (Signed)
Nutrition Brief Note  Patient identified on the Malnutrition Screening Tool (MST) Report  49 y.o. male admitted on 01/03/2020 for scheduled robotic assisted laparoscopic radical prostatectomy, bilateral nerve sparing, with bilateral pelvic lymph node dissection with Dr. Erlene Quan for management of Gleason 3+4 prostate cancer.  Met with pt in room today. Pt reports good appetite and oral intake at baseline. Pt also reports that he is eating 100% of meals in hospital. Pt reports that his weight is stable at baseline; pt does report a small amount of intentional weight loss.   Pt declines DM education today. RD did leave "Carbohydrate Counting in People with Diabetes" handout with patient.    Wt Readings from Last 15 Encounters:  12/04/19 94 kg  12/04/19 95.3 kg  12/01/19 95.3 kg  10/23/19 94.8 kg  10/15/19 98.9 kg  10/01/19 98 kg  09/12/19 98.9 kg  10/08/18 97.1 kg  08/01/18 97.1 kg  06/05/18 96.3 kg  03/20/18 96.6 kg  11/23/17 96.6 kg  11/23/17 96.7 kg  11/07/17 99.3 kg  10/28/17 99.3 kg     Labs and medications reviewed.   No nutrition interventions warranted at this time. Pt to discharge today. If nutrition issues arise, please consult RD.   Koleen Distance MS, RD, LDN Contact information available in Amion

## 2020-01-04 NOTE — Plan of Care (Signed)
Discharge teaching was completed with patient who verbalized understanding of teaching, foley care and bag drainage, medications, and follow-up appointments.  Discharged in stable condition with all belongings.

## 2020-01-04 NOTE — Progress Notes (Addendum)
Urology Inpatient Progress Note  Subjective: Joseph [name] is a 49 y.o. male admitted on 01/03/2020 for scheduled robotic assisted laparoscopic radical prostatectomy, bilateral nerve sparing, with bilateral pelvic lymph node dissection with Dr. Erlene Quan for management of Gleason 3+4 prostate cancer.  He has been seen by the diabetes coordinator during his inpatient stay for management of his diabetes.  Today, he reports feeling well.  He has some postoperative abdominal soreness, primarily on the right side.  He has been ambulating.  He is tolerating food without nausea and vomiting.  He has not yet passed flatus.  Foley catheter in place draining clear, yellow urine.  Labs notable for mild leukocytosis, likely reactive.  He does continue to have hyperglycemia consistent with baseline, A1C 10.5.  Anti-infectives: Anti-infectives (From admission, onward)   Start     Dose/Rate Route Frequency Ordered Stop   01/03/20 1400  ceFAZolin (ANCEF) IVPB 1 g/50 mL premix     1 g 100 mL/hr over 30 Minutes Intravenous Every 8 hours 01/03/20 1351 01/04/20 0559   01/03/20 0612  ceFAZolin (ANCEF) 2-4 GM/100ML-% IVPB    Note to Pharmacy: Trudie Reed   : cabinet override      01/03/20 0612 01/03/20 0815   01/03/20 0607  ceFAZolin (ANCEF) IVPB 2g/100 mL premix     2 g 200 mL/hr over 30 Minutes Intravenous 30 min pre-op 01/03/20 [id number] 01/03/20 0817      Current Facility-Administered Medications  Medication Dose Route Frequency Provider Last Rate Last Admin  . 0.9 %  sodium chloride infusion   Intravenous Continuous Hollice Espy, MD 50 mL/hr at 01/04/20 0400 Rate Verify at 01/04/20 0400  . acetaminophen (TYLENOL) tablet 650 mg  650 mg Oral Q4H PRN Hollice Espy, MD      . albuterol (PROVENTIL) (2.5 MG/3ML) 0.083% nebulizer solution 3 mL  3 mL Inhalation Q4H PRN Hollice Espy, MD      . carvedilol (COREG) tablet 25 mg  25 mg Oral BID Hollice Espy, MD   25 mg at 01/04/20 [id number]  . Chlorhexidine Gluconate  Cloth 2 % PADS 6 each  6 each Topical Daily Hollice Espy, MD      . digoxin [facility]) tablet 125 mcg  125 mcg Oral Maryclare Labrador, MD   125 mcg at 01/04/20 [phone]  . diphenhydrAMINE (BENADRYL) injection 12.5 mg  12.5 mg Intravenous Q6H PRN Hollice Espy, MD       Or  . diphenhydrAMINE (BENADRYL) 12.5 MG/5ML elixir 12.5 mg  12.5 mg Oral Q6H PRN Hollice Espy, MD      . docusate sodium (COLACE) capsule 100 mg  100 mg Oral BID Hollice Espy, MD   100 mg at 01/04/20 [id number]  . fluticasone (FLONASE) 50 MCG/ACT nasal spray 2 spray  2 spray Each Nare Daily PRN Hollice Espy, MD      . furosemide (LASIX) tablet 20 mg  20 mg Oral BID Hollice Espy, MD   20 mg at 01/04/20 0821  . heparin injection 5,000 Units  5,000 Units Subcutaneous Q8H Hollice Espy, MD   5,000 Units at 01/04/20 [phone]  . insulin aspart (novoLOG) injection 0-5 Units  0-5 Units Subcutaneous QHS Hollice Espy, MD   3 Units at 01/03/20 2156  . insulin aspart (novoLOG) injection 0-9 Units  0-9 Units Subcutaneous TID WC Hollice Espy, MD   7 Units at 01/04/20 [phone]  . insulin aspart (novoLOG) injection 4 Units  4 Units Subcutaneous TID WC Hollice Espy, MD   4 Units at 01/04/20 [phone]  .  insulin glargine (LANTUS) injection 30 Units  30 Units Subcutaneous QHS Hollice Espy, MD   30 Units at 01/03/20 2154  . loratadine (CLARITIN) tablet 10 mg  10 mg Oral Daily Hollice Espy, MD   10 mg at 01/04/20 [id number]  . losartan (COZAAR) tablet 100 mg  100 mg Oral QHS Hollice Espy, MD   100 mg at 01/03/20 2157  . montelukast (SINGULAIR) tablet 10 mg  10 mg Oral QHS Hollice Espy, MD   10 mg at 01/03/20 2100  . morphine 2 MG/ML injection 2-4 mg  2-4 mg Intravenous Q2H PRN Hollice Espy, MD   2 mg at 01/03/20 1442  . ondansetron (ZOFRAN) injection 4 mg  4 mg Intravenous Q4H PRN Hollice Espy, MD      . opium-belladonna (B&O SUPPRETTES) 16.2-60 MG suppository 1 suppository  1 suppository Rectal Q6H PRN Hollice Espy, MD      .  oxybutynin (DITROPAN) tablet 5 mg  5 mg Oral Q8H PRN Hollice Espy, MD   5 mg at 01/03/20 1427  . oxyCODONE-acetaminophen (PERCOCET/ROXICET) 5-325 MG per tablet 1-2 tablet  1-2 tablet Oral Q4H PRN Hollice Espy, MD   2 tablet at 01/03/20 2059  . pantoprazole (PROTONIX) EC tablet 40 mg  40 mg Oral PRN Hollice Espy, MD      . spironolactone (ALDACTONE) tablet 12.5 mg  12.5 mg Oral Daily Hollice Espy, MD   12.5 mg at 01/04/20 [id number]  . zolpidem (AMBIEN) tablet 5 mg  5 mg Oral QHS PRN,MR X 1 Hollice Espy, MD       Objective: Vital signs in last 24 hours: Temp:  [97.7 F (36.5 C)-98.5 F (36.9 C)] 97.7 F (36.5 C) (03/26 0405) Pulse Rate:  [67-91] 67 (03/26 0800) Resp:  [10-20] 16 (03/26 0405) BP: (108-140)/(69-84) 131/80 (03/26 0800) SpO2:  [90 %-99 %] 97 % (03/26 0405)  Intake/Output from previous day: 03/25 0701 - 03/26 0700 In: 1596.2 [I.V.:1546.2; IV Piggyback:50] Out: [id number] [Urine:1550] Intake/Output this shift: Total I/O In: -  Out: 900 [Urine:900]  Physical Exam Vitals and nursing note reviewed.  Constitutional:      General: He is not in acute distress.    Appearance: He is not ill-appearing, toxic-appearing or diaphoretic.  HENT:     Head: Normocephalic and atraumatic.  Abdominal:     Palpations: Abdomen is soft.     Tenderness: There is no guarding or rebound.     Comments: Surgical incisions visualized over the anterior abdomen, all clean, dry, and intact with overlying surgical adhesive.  Skin:    General: Skin is warm and dry.  Neurological:     Mental Status: He is alert and oriented to person, place, and time.  Psychiatric:        Mood and Affect: Mood normal.        Behavior: Behavior normal.    Lab Results:  Recent Labs    01/04/20 0634  WBC 12.5*  HGB 14.5  HCT 43.0  PLT 155   BMET Recent Labs    01/04/20 0634  NA 136  K 4.2  CL 104  CO2 25  GLUCOSE 394*  BUN 19  CREATININE 0.90  CALCIUM 8.1*   Assessment & Plan: 49 year old  male s/p RALP + BPLND for management of Gleason 3+4 prostate cancer.  He is recovering well, no acute concerns.  We will plan for discharge later today with Foley catheter in place.  Given persistent hyperglycemia, will ask for diabetes coordinator to provide discharge recommendations.  Aldona Bar  Rosita Kea, PA-C 01/04/2020

## 2020-01-04 NOTE — Discharge Summary (Signed)
Date of admission: 01/03/2020  Date of discharge: 01/04/2020  Admission diagnosis: Prostate cancer  Discharge diagnosis: Prostate cancer, hyperglycemia  Secondary diagnoses:  Patient Active Problem List   Diagnosis Date Noted  . Prostate cancer ([place]) 01/03/2020  . 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 Elevated PSA 07/25/2017  . Erectile dysfunction associated with type 2 diabetes mellitus ([place]) 07/25/2017  . Acute on chronic systolic heart failure ([place]) 11/24/2016  . Shortness of breath   . Abnormal EKG   . DKA (diabetic ketoacidoses) ([place]) 04/18/2016  . CAP (community acquired pneumonia) 04/18/2016  . Accelerated hypertension 04/18/2016  . Abdominal pain 04/18/2016    History and Physical: For full details, please see admission history and physical. Briefly, Joseph [name] is a 49 y.o. year old patient admitted on 01/03/2020 for scheduled robotic assisted laparoscopic radical prostatectomy, bilateral nerve sparing, with bilateral pelvic lymph node dissection with Dr. Erlene Quan for management of Gleason 3+4 prostate cancer.   Hospital Course: Patient tolerated the procedure well.  He was then transferred to the floor after an uneventful PACU stay.  His hospital course was uncomplicated, however he was seen by the diabetes coordinator for management of hyperglycemia in the setting of diabetes.  Discharge recommendations appreciated.  On POD#1 he had met discharge criteria: was eating a regular diet, was up and ambulating independently,  pain was well controlled, catheter was draining well, and was ready for discharge.  Laboratory values:  Recent Labs    01/04/20 0634  WBC 12.5*  HGB 14.5  HCT 43.0   Recent Labs    01/04/20 0634  NA 136  K 4.2  CL 104  CO2 25  GLUCOSE 394*  BUN 19  CREATININE 0.90  CALCIUM 8.1*   Results for orders placed or performed during the hospital encounter of 01/01/20  SARS CORONAVIRUS 2 (TAT 6-24 HRS) Nasopharyngeal Nasopharyngeal Swab     Status:  None   Collection Time: 01/01/20  8:30 AM   Specimen: Nasopharyngeal Swab  Result Value Ref Range Status   SARS Coronavirus 2 NEGATIVE NEGATIVE Final    Comment: (NOTE) SARS-CoV-2 target nucleic acids are NOT DETECTED. The SARS-CoV-2 RNA is generally detectable in upper and lower respiratory specimens during the acute phase of infection. Negative results do not preclude SARS-CoV-2 infection, do not rule out co-infections with other pathogens, and should not be used as the sole basis for treatment or other patient management decisions. Negative results must be combined with clinical observations, patient history, and epidemiological information. The expected result is Negative. Fact Sheet for Patients: SugarRoll.be Fact Sheet for Healthcare Providers: https://www.woods-mathews.com/ This test is not yet approved or cleared by the [place] FDA and  has been authorized for detection and/or diagnosis of SARS-CoV-2 by FDA under an Emergency Use Authorization (EUA). This EUA will remain  in effect (meaning this test can be used) for the duration of the COVID-19 declaration under Section 56 4(b)(1) of the Act, 21 U.S.C. section 360bbb-3(b)(1), unless the authorization is terminated or revoked sooner. Performed at [facility], [place] [address], [place], [place] [address]    Disposition: Home  Discharge instruction: The patient was instructed to be ambulatory but told to refrain from heavy lifting, strenuous activity, or driving.  He was provided written and verbal instructions for Foley catheter care. Per diabetes coordinator, resume home insulin regimen and follow-up with PCP within one week.  Discharge medications:  Allergies as of 01/04/2020      Reactions   Vancomycin Shortness Of Breath  Lisinopril Rash      Medication List    TAKE these medications   albuterol 108 (90 Base) MCG/ACT inhaler Commonly known as: Proventil  HFA Inhale 2 puffs into the lungs every 4 (four) hours as needed for wheezing or shortness of breath.   aspirin EC 81 MG tablet Take 81 mg by mouth every morning.   B-D ULTRAFINE III SHORT PEN 31G X 8 MM Misc Generic drug: Insulin Pen Needle USE 5 TIMES DAILY AS  DIRECTED   budesonide 1 MG/2ML nebulizer solution Commonly known as: PULMICORT Inhale 1 mg into the lungs daily. Pt has not gotten Rx filled as of 12-31-19   carvedilol 25 MG tablet Commonly known as: COREG Take 1 tablet (25 mg total) by mouth 2 (two) times daily.   D3-1000 25 MCG (1000 UT) capsule Generic drug: Cholecalciferol Take 1,000 Units by mouth daily.   digoxin 0.125 MG tablet Commonly known as: LANOXIN Take 1 tablet (125 mcg total) by mouth daily. What changed: when to take this   docusate sodium 100 MG capsule Commonly known as: COLACE Take 1 capsule (100 mg total) by mouth 2 (two) times daily.   fexofenadine-pseudoephedrine 60-120 MG 12 hr tablet Commonly known as: ALLEGRA-D Take 1 tablet by mouth 2 (two) times daily. What changed:   when to take this  reasons to take this   fluticasone 50 MCG/ACT nasal spray Commonly known as: FLONASE Place 2 sprays into both nostrils daily as needed for allergies.   FreeStyle Libre 14 Day Reader Kerrin Mo Use 1 Device continuously as needed   [employer] 14 Day Sensor Misc Use 1 Device every 14 (fourteen) days   furosemide 20 MG tablet Commonly known as: LASIX Take 1 tablet (20 mg total) by mouth 2 (two) times daily.   GlucaGen Diagnostic 1 MG injection Generic drug: glucagon (human recombinant) Inject into the muscle.   glucagon 1 MG injection Inject into the muscle.   HumaLOG KwikPen 100 UNIT/ML KwikPen Generic drug: insulin lispro Inject 0.18-0.22 mLs (18-22 Units total) into the skin See admin instructions. Inject 18 units subcutaneous in the morning, inject 20 units subcutaneous at lunchtime, and inject 22 units subcutaneous at night. Plus  sliding scale as directed. What changed: additional instructions   loratadine 10 MG tablet Commonly known as: CLARITIN Take 10 mg by mouth daily as needed.   losartan 100 MG tablet Commonly known as: COZAAR Take 1 tablet (100 mg total) by mouth at bedtime.   montelukast 10 MG tablet Commonly known as: SINGULAIR Take by mouth.   oxybutynin 5 MG tablet Commonly known as: DITROPAN Take 1 tablet (5 mg total) by mouth every 8 (eight) hours as needed for bladder spasms.   oxyCODONE-acetaminophen 5-325 MG tablet Commonly known as: PERCOCET/ROXICET Take 1-2 tablets by mouth every 6 (six) hours as needed for moderate pain.   pantoprazole 40 MG tablet Commonly known as: PROTONIX Take 1 tablet (40 mg total) by mouth daily. What changed:   when to take this  reasons to take this   spironolactone 25 MG tablet Commonly known as: ALDACTONE Take 0.5 tablets (12.5 mg total) by mouth daily.   Toujeo Max SoloStar 300 UNIT/ML Solostar Pen Generic drug: insulin glargine (2 Unit Dial) Inject 44 Units into the skin daily. What changed: when to take this       Followup:  1 week for Foley catheter removal, 4 weeks for postop follow-up with PSA prior as below. Future Appointments  Date Time Provider [place]  01/10/2020  8:30 AM McGowan, Larene [place] A, PA-C [facility] None  02/04/2020  3:30 PM [facility] None  02/06/2020  1:30 PM Hollice Espy, MD [facility] None

## 2020-01-04 NOTE — Discharge Instructions (Signed)
Please follow up with your PCP within the next week to check your blood sugar following surgery.

## 2020-01-04 NOTE — Progress Notes (Signed)
Results for ORPHEUS, SCHMICK (MRN [id number]) as of 01/04/2020 09:53  Ref. Range 01/03/2020 11:25 01/03/2020 13:16 01/03/2020 17:06 01/03/2020 21:04 01/04/2020 07:49  Glucose-Capillary Latest Ref Range: 70 - 99 mg/dL 301 (H) 288 (H) 303 (H) 284 (H) 339 (H)  Noted that patient did receive Decadron in surgery yesterday and that is probably the cause for high blood sugars.  Recommend discharging patient on home insulin regimen of Toujeo 44 units daily, and Novolog 18 units in the morning, 20 units at lunch, 22 units at dinner (per med rec).  Patient should follow up with PCP after discharge.  Harvel Ricks RN BSN CDE Diabetes Coordinator Pager: [phone]  8am-5pm

## 2020-01-08 LAB — SURGICAL PATHOLOGY

## 2020-01-09 NOTE — Progress Notes (Signed)
01/10/20  Catheter Removal  Patient is present today for a catheter removal.  10 ml of water was drained from the balloon. A 18 FR foley cath was removed from the bladder no complications were noted . Patient tolerated well. Incisions from radical prostatectomy well healed.   He reports of soreness from waking up and aching/swollen testicles following surgery. He has been passing gas and bowel movement. He states of eating well and denies nausea.   Physical Exam:  GU exam:No swollen testicles. Circumcised.  GI exam:Scattered incisions, well healed, clean and dry.   Performed by: Zara Council PA-C  Follow up/ Additional notes: F/u with Dr. Erlene Quan for surgical pathology report.   Jamas Lav, am acting as a Education administrator for [employer],  I have reviewed the above documentation for accuracy and completeness, and I agree with the above.    Zara Council, PA-C

## 2020-01-10 ENCOUNTER — Encounter: Payer: Self-pay | Admitting: Urology

## 2020-01-10 ENCOUNTER — Other Ambulatory Visit: Payer: Self-pay

## 2020-01-10 ENCOUNTER — Ambulatory Visit (INDEPENDENT_AMBULATORY_CARE_PROVIDER_SITE_OTHER): Payer: PRIVATE HEALTH INSURANCE | Admitting: Urology

## 2020-01-10 VITALS — BP 121/71 | HR 78 | Ht 72.0 in | Wt 207.0 lb

## 2020-01-10 DIAGNOSIS — C61 Malignant neoplasm of prostate: Secondary | ICD-10-CM

## 2020-01-11 ENCOUNTER — Emergency Department: Payer: PRIVATE HEALTH INSURANCE

## 2020-01-11 ENCOUNTER — Other Ambulatory Visit: Payer: Self-pay

## 2020-01-11 ENCOUNTER — Inpatient Hospital Stay
Admission: EM | Admit: 2020-01-11 | Discharge: 2020-01-15 | DRG: 698 | Disposition: A | Discharge status: Home / Self Care | Payer: PRIVATE HEALTH INSURANCE | Attending: Internal Medicine | Admitting: Internal Medicine

## 2020-01-11 DIAGNOSIS — I110 Hypertensive heart disease with heart failure: Secondary | ICD-10-CM | POA: Diagnosis present

## 2020-01-11 DIAGNOSIS — T83511A Infection and inflammatory reaction due to indwelling urethral catheter, initial encounter: Principal | ICD-10-CM | POA: Diagnosis present

## 2020-01-11 DIAGNOSIS — N3090 Cystitis, unspecified without hematuria: Secondary | ICD-10-CM

## 2020-01-11 DIAGNOSIS — Z8249 Family history of ischemic heart disease and other diseases of the circulatory system: Secondary | ICD-10-CM

## 2020-01-11 DIAGNOSIS — I5022 Chronic systolic (congestive) heart failure: Secondary | ICD-10-CM

## 2020-01-11 DIAGNOSIS — N390 Urinary tract infection, site not specified: Secondary | ICD-10-CM | POA: Diagnosis not present

## 2020-01-11 DIAGNOSIS — K651 Peritoneal abscess: Secondary | ICD-10-CM

## 2020-01-11 DIAGNOSIS — Z881 Allergy status to other antibiotic agents status: Secondary | ICD-10-CM

## 2020-01-11 DIAGNOSIS — Z1611 Resistance to penicillins: Secondary | ICD-10-CM | POA: Diagnosis present

## 2020-01-11 DIAGNOSIS — Z8701 Personal history of pneumonia (recurrent): Secondary | ICD-10-CM

## 2020-01-11 DIAGNOSIS — B9620 Unspecified Escherichia coli [E. coli] as the cause of diseases classified elsewhere: Secondary | ICD-10-CM | POA: Diagnosis present

## 2020-01-11 DIAGNOSIS — Z955 Presence of coronary angioplasty implant and graft: Secondary | ICD-10-CM

## 2020-01-11 DIAGNOSIS — Z7982 Long term (current) use of aspirin: Secondary | ICD-10-CM

## 2020-01-11 DIAGNOSIS — Z888 Allergy status to other drugs, medicaments and biological substances status: Secondary | ICD-10-CM

## 2020-01-11 DIAGNOSIS — Z20822 Contact with and (suspected) exposure to covid-19: Secondary | ICD-10-CM | POA: Diagnosis present

## 2020-01-11 DIAGNOSIS — T8143XA Infection following a procedure, organ and space surgical site, initial encounter: Secondary | ICD-10-CM | POA: Diagnosis present

## 2020-01-11 DIAGNOSIS — G4733 Obstructive sleep apnea (adult) (pediatric): Secondary | ICD-10-CM | POA: Diagnosis present

## 2020-01-11 DIAGNOSIS — E1165 Type 2 diabetes mellitus with hyperglycemia: Secondary | ICD-10-CM | POA: Diagnosis present

## 2020-01-11 DIAGNOSIS — I255 Ischemic cardiomyopathy: Secondary | ICD-10-CM | POA: Diagnosis present

## 2020-01-11 DIAGNOSIS — R339 Retention of urine, unspecified: Secondary | ICD-10-CM | POA: Diagnosis present

## 2020-01-11 DIAGNOSIS — Z95810 Presence of automatic (implantable) cardiac defibrillator: Secondary | ICD-10-CM

## 2020-01-11 DIAGNOSIS — N5082 Scrotal pain: Secondary | ICD-10-CM

## 2020-01-11 DIAGNOSIS — Z9079 Acquired absence of other genital organ(s): Secondary | ICD-10-CM

## 2020-01-11 DIAGNOSIS — I2510 Atherosclerotic heart disease of native coronary artery without angina pectoris: Secondary | ICD-10-CM

## 2020-01-11 DIAGNOSIS — Z79899 Other long term (current) drug therapy: Secondary | ICD-10-CM

## 2020-01-11 DIAGNOSIS — Z794 Long term (current) use of insulin: Secondary | ICD-10-CM

## 2020-01-11 DIAGNOSIS — Z8546 Personal history of malignant neoplasm of prostate: Secondary | ICD-10-CM

## 2020-01-11 DIAGNOSIS — I252 Old myocardial infarction: Secondary | ICD-10-CM

## 2020-01-11 DIAGNOSIS — J45909 Unspecified asthma, uncomplicated: Secondary | ICD-10-CM

## 2020-01-11 DIAGNOSIS — Y846 Urinary catheterization as the cause of abnormal reaction of the patient, or of later complication, without mention of misadventure at the time of the procedure: Secondary | ICD-10-CM

## 2020-01-11 LAB — COMPREHENSIVE METABOLIC PANEL
ALT: 24 U/L (ref 0–44)
AST: 21 U/L (ref 15–41)
Albumin: 3.2 g/dL — ABNORMAL LOW (ref 3.5–5.0)
Alkaline Phosphatase: 78 U/L (ref 38–126)
Anion gap: 7 (ref 5–15)
BUN: 16 mg/dL (ref 6–20)
CO2: 25 mmol/L (ref 22–32)
Calcium: 8.3 mg/dL — ABNORMAL LOW (ref 8.9–10.3)
Chloride: 102 mmol/L (ref 98–111)
Creatinine, Ser: 0.91 mg/dL (ref 0.61–1.24)
GFR calc Af Amer: >60 mL/min (ref >60)
GFR calc non Af Amer: >60 mL/min (ref >60)
Glucose, Bld: 337 mg/dL — ABNORMAL HIGH (ref 70–99)
Potassium: 4.1 mmol/L (ref 3.5–5.1)
Sodium: 134 mmol/L — ABNORMAL LOW (ref 135–145)
Total Bilirubin: 1.2 mg/dL (ref 0.3–1.2)
Total Protein: 6.7 g/dL (ref 6.5–8.1)

## 2020-01-11 LAB — CBC WITH DIFFERENTIAL/PLATELET
Abs Immature Granulocytes: 0.08 10*3/uL — ABNORMAL HIGH (ref 0.00–0.07)
Basophils Absolute: 0 10*3/uL (ref 0.0–0.1)
Basophils Relative: 0 %
Eosinophils Absolute: 0.8 10*3/uL — ABNORMAL HIGH (ref 0.0–0.5)
Eosinophils Relative: 5 %
HCT: 39.5 % (ref 39.0–52.0)
Hemoglobin: 13.7 g/dL (ref 13.0–17.0)
Immature Granulocytes: 1 %
Lymphocytes Relative: 8 %
Lymphs Abs: 1.3 10*3/uL (ref 0.7–4.0)
MCH: 30.4 pg (ref 26.0–34.0)
MCHC: 34.7 g/dL (ref 30.0–36.0)
MCV: 87.8 fL (ref 80.0–100.0)
Monocytes Absolute: 1.6 10*3/uL — ABNORMAL HIGH (ref 0.1–1.0)
Monocytes Relative: 10 %
Neutro Abs: 12.3 10*3/uL — ABNORMAL HIGH (ref 1.7–7.7)
Neutrophils Relative %: 76 %
Platelets: 187 10*3/uL (ref 150–400)
RBC: 4.5 MIL/uL (ref 4.22–5.81)
RDW: 12.1 % (ref 11.5–15.5)
WBC: 16.1 10*3/uL — ABNORMAL HIGH (ref 4.0–10.5)
nRBC: 0 % (ref 0.0–0.2)

## 2020-01-11 LAB — PROTIME-INR
INR: 1.2 (ref 0.8–1.2)
Prothrombin Time: 14.9 seconds (ref 11.4–15.2)

## 2020-01-11 LAB — LACTIC ACID, PLASMA: Lactic Acid, Venous: 1.1 mmol/L (ref 0.5–1.9)

## 2020-01-11 MED ORDER — ACETAMINOPHEN 500 MG PO TABS
ORAL_TABLET | ORAL | Status: AC
  Administered 2020-01-11: 1000 mg via ORAL
  Filled 2020-01-11: qty 2

## 2020-01-11 MED ORDER — ONDANSETRON HCL 4 MG/2ML IJ SOLN
4.0000 mg | Freq: Once | INTRAMUSCULAR | Status: AC
  Administered 2020-01-12: 4 mg via INTRAVENOUS
  Filled 2020-01-11: qty 2

## 2020-01-11 MED ORDER — ACETAMINOPHEN 500 MG PO TABS: 1000.0000 mg | ORAL_TABLET | Freq: Once | ORAL | Status: AC

## 2020-01-11 MED ORDER — MORPHINE SULFATE (PF) 4 MG/ML IV SOLN
4.0000 mg | Freq: Once | INTRAVENOUS | Status: AC
  Administered 2020-01-12: 4 mg via INTRAVENOUS
  Filled 2020-01-11: qty 1

## 2020-01-11 MED ORDER — SODIUM CHLORIDE 0.9 % IV BOLUS
1000.0000 mL | Freq: Once | INTRAVENOUS | Status: AC
  Administered 2020-01-11: 23:00:00 1000 mL via INTRAVENOUS

## 2020-01-11 NOTE — ED Triage Notes (Signed)
Patient reports prostate surgery last week for prostate cancer. Patient reports catheter was removed yesterday. Patient reports hematuria. Patient c/o generalized body aches at home and fever, TMax 102.

## 2020-01-12 ENCOUNTER — Emergency Department: Payer: PRIVATE HEALTH INSURANCE

## 2020-01-12 ENCOUNTER — Inpatient Hospital Stay: Payer: PRIVATE HEALTH INSURANCE

## 2020-01-12 DIAGNOSIS — T8143XA Infection following a procedure, organ and space surgical site, initial encounter: Secondary | ICD-10-CM

## 2020-01-12 DIAGNOSIS — Z8546 Personal history of malignant neoplasm of prostate: Secondary | ICD-10-CM | POA: Diagnosis not present

## 2020-01-12 DIAGNOSIS — J45909 Unspecified asthma, uncomplicated: Secondary | ICD-10-CM

## 2020-01-12 DIAGNOSIS — Z95810 Presence of automatic (implantable) cardiac defibrillator: Secondary | ICD-10-CM | POA: Diagnosis not present

## 2020-01-12 DIAGNOSIS — G4733 Obstructive sleep apnea (adult) (pediatric): Secondary | ICD-10-CM | POA: Diagnosis present

## 2020-01-12 DIAGNOSIS — N390 Urinary tract infection, site not specified: Secondary | ICD-10-CM

## 2020-01-12 DIAGNOSIS — K651 Peritoneal abscess: Secondary | ICD-10-CM | POA: Diagnosis present

## 2020-01-12 DIAGNOSIS — I252 Old myocardial infarction: Secondary | ICD-10-CM | POA: Diagnosis not present

## 2020-01-12 DIAGNOSIS — R339 Retention of urine, unspecified: Secondary | ICD-10-CM | POA: Diagnosis present

## 2020-01-12 DIAGNOSIS — Y846 Urinary catheterization as the cause of abnormal reaction of the patient, or of later complication, without mention of misadventure at the time of the procedure: Secondary | ICD-10-CM | POA: Diagnosis not present

## 2020-01-12 DIAGNOSIS — E1165 Type 2 diabetes mellitus with hyperglycemia: Secondary | ICD-10-CM | POA: Diagnosis present

## 2020-01-12 DIAGNOSIS — Z7982 Long term (current) use of aspirin: Secondary | ICD-10-CM | POA: Diagnosis not present

## 2020-01-12 DIAGNOSIS — I2510 Atherosclerotic heart disease of native coronary artery without angina pectoris: Secondary | ICD-10-CM

## 2020-01-12 DIAGNOSIS — B9620 Unspecified Escherichia coli [E. coli] as the cause of diseases classified elsewhere: Secondary | ICD-10-CM | POA: Diagnosis present

## 2020-01-12 DIAGNOSIS — N3090 Cystitis, unspecified without hematuria: Secondary | ICD-10-CM | POA: Diagnosis not present

## 2020-01-12 DIAGNOSIS — Z8701 Personal history of pneumonia (recurrent): Secondary | ICD-10-CM | POA: Diagnosis not present

## 2020-01-12 DIAGNOSIS — R319 Hematuria, unspecified: Secondary | ICD-10-CM

## 2020-01-12 DIAGNOSIS — I255 Ischemic cardiomyopathy: Secondary | ICD-10-CM | POA: Diagnosis present

## 2020-01-12 DIAGNOSIS — Z9079 Acquired absence of other genital organ(s): Secondary | ICD-10-CM

## 2020-01-12 DIAGNOSIS — A419 Sepsis, unspecified organism: Secondary | ICD-10-CM | POA: Diagnosis not present

## 2020-01-12 DIAGNOSIS — Z79899 Other long term (current) drug therapy: Secondary | ICD-10-CM | POA: Diagnosis not present

## 2020-01-12 DIAGNOSIS — Z794 Long term (current) use of insulin: Secondary | ICD-10-CM | POA: Diagnosis not present

## 2020-01-12 DIAGNOSIS — Z20822 Contact with and (suspected) exposure to covid-19: Secondary | ICD-10-CM | POA: Diagnosis present

## 2020-01-12 DIAGNOSIS — I5022 Chronic systolic (congestive) heart failure: Secondary | ICD-10-CM | POA: Diagnosis present

## 2020-01-12 DIAGNOSIS — I110 Hypertensive heart disease with heart failure: Secondary | ICD-10-CM | POA: Diagnosis present

## 2020-01-12 DIAGNOSIS — Z1611 Resistance to penicillins: Secondary | ICD-10-CM | POA: Diagnosis present

## 2020-01-12 DIAGNOSIS — Z888 Allergy status to other drugs, medicaments and biological substances status: Secondary | ICD-10-CM | POA: Diagnosis not present

## 2020-01-12 DIAGNOSIS — Z881 Allergy status to other antibiotic agents status: Secondary | ICD-10-CM | POA: Diagnosis not present

## 2020-01-12 DIAGNOSIS — T83511A Infection and inflammatory reaction due to indwelling urethral catheter, initial encounter: Secondary | ICD-10-CM | POA: Diagnosis present

## 2020-01-12 DIAGNOSIS — Z955 Presence of coronary angioplasty implant and graft: Secondary | ICD-10-CM | POA: Diagnosis not present

## 2020-01-12 DIAGNOSIS — T83511D Infection and inflammatory reaction due to indwelling urethral catheter, subsequent encounter: Secondary | ICD-10-CM | POA: Diagnosis not present

## 2020-01-12 LAB — URINALYSIS, COMPLETE (UACMP) WITH MICROSCOPIC
Bacteria, UA: NONE SEEN
Bilirubin Urine: NEGATIVE
Glucose, UA: >=500 mg/dL — AB
Ketones, ur: 5 mg/dL — AB
Nitrite: NEGATIVE
Protein, ur: 100 mg/dL — AB
RBC / HPF: >50 RBC/hpf — ABNORMAL HIGH (ref 0–5)
Specific Gravity, Urine: 1.039 — ABNORMAL HIGH (ref 1.005–1.030)
Squamous Epithelial / HPF: NONE SEEN (ref 0–5)
WBC, UA: >50 WBC/hpf — ABNORMAL HIGH (ref 0–5)
pH: 5 (ref 5.0–8.0)

## 2020-01-12 LAB — RESPIRATORY PANEL BY RT PCR (FLU A&B, COVID)
Influenza A by PCR: NEGATIVE
Influenza B by PCR: NEGATIVE
SARS Coronavirus 2 by RT PCR: NEGATIVE

## 2020-01-12 LAB — CREATININE, SERUM
Creatinine, Ser: 0.85 mg/dL (ref 0.61–1.24)
GFR calc Af Amer: >60 mL/min (ref >60)
GFR calc non Af Amer: >60 mL/min (ref >60)

## 2020-01-12 LAB — HIV ANTIBODY (ROUTINE TESTING W REFLEX): HIV Screen 4th Generation wRfx: NONREACTIVE

## 2020-01-12 LAB — GLUCOSE, CAPILLARY
Glucose-Capillary: 223 mg/dL — ABNORMAL HIGH (ref 70–99)
Glucose-Capillary: 146 mg/dL — ABNORMAL HIGH (ref 70–99)
Glucose-Capillary: 263 mg/dL — ABNORMAL HIGH (ref 70–99)
Glucose-Capillary: 116 mg/dL — ABNORMAL HIGH (ref 70–99)

## 2020-01-12 LAB — CBC
HCT: 39.1 % (ref 39.0–52.0)
Hemoglobin: 13 g/dL (ref 13.0–17.0)
MCH: 30.4 pg (ref 26.0–34.0)
MCHC: 33.2 g/dL (ref 30.0–36.0)
MCV: 91.4 fL (ref 80.0–100.0)
Platelets: 154 10*3/uL (ref 150–400)
RBC: 4.28 MIL/uL (ref 4.22–5.81)
RDW: 12.2 % (ref 11.5–15.5)
WBC: 15.9 10*3/uL — ABNORMAL HIGH (ref 4.0–10.5)
nRBC: 0 % (ref 0.0–0.2)

## 2020-01-12 MED ORDER — INSULIN ASPART 100 UNIT/ML ~~LOC~~ SOLN
0.0000 [IU] | Freq: Every day | SUBCUTANEOUS | Status: DC
  Administered 2020-01-14: 21:00:00 3 [IU] via SUBCUTANEOUS
  Filled 2020-01-12: qty 1

## 2020-01-12 MED ORDER — ONDANSETRON HCL 4 MG PO TABS: 4.0000 mg | ORAL_TABLET | Freq: Four times a day (QID) | ORAL | Status: DC | PRN

## 2020-01-12 MED ORDER — BUDESONIDE 0.5 MG/2ML IN SUSP
1.0000 mg | Freq: Two times a day (BID) | RESPIRATORY_TRACT | Status: DC
  Administered 2020-01-12: 0.5 mg via RESPIRATORY_TRACT
  Administered 2020-01-13 – 2020-01-15 (×5): 1 mg via RESPIRATORY_TRACT
  Filled 2020-01-12 (×7): qty 4

## 2020-01-12 MED ORDER — OXYBUTYNIN CHLORIDE 5 MG PO TABS
5.0000 mg | ORAL_TABLET | Freq: Three times a day (TID) | ORAL | Status: DC | PRN
  Filled 2020-01-12: qty 1

## 2020-01-12 MED ORDER — SENNOSIDES-DOCUSATE SODIUM 8.6-50 MG PO TABS: 1.0000 | ORAL_TABLET | Freq: Every evening | ORAL | Status: DC | PRN

## 2020-01-12 MED ORDER — FUROSEMIDE 20 MG PO TABS
20.0000 mg | ORAL_TABLET | Freq: Two times a day (BID) | ORAL | Status: DC
  Administered 2020-01-12 – 2020-01-15 (×6): 20 mg via ORAL
  Filled 2020-01-12 (×7): qty 1

## 2020-01-12 MED ORDER — INSULIN ASPART 100 UNIT/ML ~~LOC~~ SOLN
15.0000 [IU] | Freq: Three times a day (TID) | SUBCUTANEOUS | Status: DC
  Administered 2020-01-12 – 2020-01-13 (×3): 15 [IU] via SUBCUTANEOUS
  Filled 2020-01-12 (×3): qty 1

## 2020-01-12 MED ORDER — CHLORHEXIDINE GLUCONATE CLOTH 2 % EX PADS
6.0000 | MEDICATED_PAD | Freq: Every day | CUTANEOUS | Status: DC
  Administered 2020-01-12 – 2020-01-13 (×2): 6 via TOPICAL

## 2020-01-12 MED ORDER — HYDROCODONE-ACETAMINOPHEN 5-325 MG PO TABS
1.0000 | ORAL_TABLET | ORAL | Status: DC | PRN
  Administered 2020-01-12: 1 via ORAL
  Filled 2020-01-12: qty 1

## 2020-01-12 MED ORDER — ALBUTEROL SULFATE (2.5 MG/3ML) 0.083% IN NEBU
2.5000 mg | INHALATION_SOLUTION | Freq: Four times a day (QID) | RESPIRATORY_TRACT | Status: DC | PRN
  Administered 2020-01-15: 2.5 mg via RESPIRATORY_TRACT
  Filled 2020-01-12: qty 3

## 2020-01-12 MED ORDER — ALBUTEROL SULFATE HFA 108 (90 BASE) MCG/ACT IN AERS: 2.0000 | INHALATION_SPRAY | RESPIRATORY_TRACT | Status: DC | PRN

## 2020-01-12 MED ORDER — SODIUM CHLORIDE 0.9 % IV SOLN
1.0000 g | Freq: Once | INTRAVENOUS | Status: AC
  Administered 2020-01-12: 01:00:00 1 g via INTRAVENOUS
  Filled 2020-01-12: qty 10

## 2020-01-12 MED ORDER — ONDANSETRON HCL 4 MG/2ML IJ SOLN: 4.0000 mg | Freq: Four times a day (QID) | INTRAMUSCULAR | Status: DC | PRN

## 2020-01-12 MED ORDER — DIGOXIN 125 MCG PO TABS
125.0000 ug | ORAL_TABLET | Freq: Every day | ORAL | Status: DC
  Administered 2020-01-12 – 2020-01-15 (×4): 125 ug via ORAL
  Filled 2020-01-12 (×4): qty 1

## 2020-01-12 MED ORDER — CARVEDILOL 25 MG PO TABS
25.0000 mg | ORAL_TABLET | Freq: Two times a day (BID) | ORAL | Status: DC
  Administered 2020-01-12 – 2020-01-15 (×6): 25 mg via ORAL
  Filled 2020-01-12 (×7): qty 1

## 2020-01-12 MED ORDER — METRONIDAZOLE IN NACL 5-0.79 MG/ML-% IV SOLN
500.0000 mg | Freq: Once | INTRAVENOUS | Status: AC
  Administered 2020-01-12: 500 mg via INTRAVENOUS
  Filled 2020-01-12: qty 100

## 2020-01-12 MED ORDER — BUDESONIDE 0.5 MG/2ML IN SUSP: 1.0000 mg | Freq: Every day | RESPIRATORY_TRACT | Status: DC | PRN

## 2020-01-12 MED ORDER — MORPHINE SULFATE (PF) 2 MG/ML IV SOLN
2.0000 mg | INTRAVENOUS | Status: DC | PRN
  Administered 2020-01-12: 2 mg via INTRAVENOUS
  Filled 2020-01-12: qty 1

## 2020-01-12 MED ORDER — ACETAMINOPHEN 325 MG PO TABS
650.0000 mg | ORAL_TABLET | Freq: Four times a day (QID) | ORAL | Status: DC | PRN
  Administered 2020-01-12 – 2020-01-14 (×4): 650 mg via ORAL
  Filled 2020-01-12 (×4): qty 2

## 2020-01-12 MED ORDER — INSULIN GLARGINE 100 UNIT/ML ~~LOC~~ SOLN
44.0000 [IU] | Freq: Every day | SUBCUTANEOUS | Status: DC
  Administered 2020-01-13 – 2020-01-14 (×2): 44 [IU] via SUBCUTANEOUS
  Filled 2020-01-12 (×2): qty 0.44

## 2020-01-12 MED ORDER — DOCUSATE SODIUM 100 MG PO CAPS
100.0000 mg | ORAL_CAPSULE | Freq: Two times a day (BID) | ORAL | Status: DC
  Administered 2020-01-12 – 2020-01-15 (×6): 100 mg via ORAL
  Filled 2020-01-12 (×6): qty 1

## 2020-01-12 MED ORDER — SODIUM CHLORIDE 0.9 % IV SOLN: INTRAVENOUS | Status: DC

## 2020-01-12 MED ORDER — ENOXAPARIN SODIUM 40 MG/0.4ML ~~LOC~~ SOLN
40.0000 mg | SUBCUTANEOUS | Status: DC
  Administered 2020-01-12 – 2020-01-15 (×3): 40 mg via SUBCUTANEOUS
  Filled 2020-01-12 (×4): qty 0.4

## 2020-01-12 MED ORDER — IOHEXOL 300 MG/ML  SOLN
100.0000 mL | Freq: Once | INTRAMUSCULAR | Status: AC | PRN
  Administered 2020-01-12: 02:00:00 100 mL via INTRAVENOUS

## 2020-01-12 MED ORDER — SODIUM CHLORIDE 0.9 % IV SOLN: 1.0000 g | Freq: Once | INTRAVENOUS | Status: DC

## 2020-01-12 MED ORDER — SPIRONOLACTONE 25 MG PO TABS
12.5000 mg | ORAL_TABLET | Freq: Every day | ORAL | Status: DC
  Administered 2020-01-12 – 2020-01-15 (×4): 12.5 mg via ORAL
  Filled 2020-01-12: qty 1
  Filled 2020-01-12: qty 0.5
  Filled 2020-01-12: qty 1
  Filled 2020-01-12: qty 0.5
  Filled 2020-01-12: qty 1
  Filled 2020-01-12 (×2): qty 0.5
  Filled 2020-01-12: qty 1

## 2020-01-12 MED ORDER — SODIUM CHLORIDE 0.9 % IV SOLN
2.0000 g | Freq: Three times a day (TID) | INTRAVENOUS | Status: DC
  Administered 2020-01-12 – 2020-01-14 (×7): 2 g via INTRAVENOUS
  Filled 2020-01-12 (×9): qty 2

## 2020-01-12 MED ORDER — PANTOPRAZOLE SODIUM 40 MG PO TBEC
40.0000 mg | DELAYED_RELEASE_TABLET | Freq: Every day | ORAL | Status: DC
  Administered 2020-01-12 – 2020-01-15 (×4): 40 mg via ORAL
  Filled 2020-01-12 (×4): qty 1

## 2020-01-12 MED ORDER — LOSARTAN POTASSIUM 50 MG PO TABS
100.0000 mg | ORAL_TABLET | Freq: Every day | ORAL | Status: DC
  Administered 2020-01-12 – 2020-01-14 (×3): 100 mg via ORAL
  Filled 2020-01-12 (×3): qty 2

## 2020-01-12 MED ORDER — INSULIN ASPART 100 UNIT/ML ~~LOC~~ SOLN
0.0000 [IU] | Freq: Three times a day (TID) | SUBCUTANEOUS | Status: DC
  Administered 2020-01-12: 08:00:00 5 [IU] via SUBCUTANEOUS
  Administered 2020-01-12: 2 [IU] via SUBCUTANEOUS
  Administered 2020-01-12 – 2020-01-13 (×2): 8 [IU] via SUBCUTANEOUS
  Administered 2020-01-13: 11 [IU] via SUBCUTANEOUS
  Administered 2020-01-13: 8 [IU] via SUBCUTANEOUS
  Administered 2020-01-14 (×2): 11 [IU] via SUBCUTANEOUS
  Administered 2020-01-15: 5 [IU] via SUBCUTANEOUS
  Filled 2020-01-12 (×9): qty 1

## 2020-01-12 MED ORDER — ACETAMINOPHEN 650 MG RE SUPP: 650.0000 mg | Freq: Four times a day (QID) | RECTAL | Status: DC | PRN

## 2020-01-12 MED ORDER — MONTELUKAST SODIUM 10 MG PO TABS
10.0000 mg | ORAL_TABLET | Freq: Every day | ORAL | Status: DC
  Administered 2020-01-12 – 2020-01-14 (×3): 10 mg via ORAL
  Filled 2020-01-12 (×4): qty 1

## 2020-01-12 NOTE — ED Notes (Signed)
RN will transport patient to assigned room once Ultrasound has complete their procedure.

## 2020-01-12 NOTE — Consult Note (Signed)
Subjective: 1. Postprocedural intraabdominal abscess   2. Cystitis   3. Scrotal pain   4. Pelvic abscess in male [facility])     CC: Fever and chills.  Hx: Joseph [name] is a 49 yo AAM who I was asked to see in consultation by Dr. Clementeen Graham for fever and chills with possible pelvic abscess 2 days s/p foley removal following a RALP with BPLND by Dr. Erlene Quan on 01/03/20.  The foley was removed on 4/1 and on 4/2 he began to have fever and chills.  I was contacted and he was asked to go to the ER.  He had a temperature to 102 at home prior to coming to the ER.  He has some lower abdominal discomfort, but his voiding and his PVR was only 81ml.  He has some incontinence as expected.  When I saw him he had no voided in about 5hrs but he voided at my request and his output was 133ml for concentrated, but otherwise clear urine.   He had some testicular discomfort as well.  At CT showed a small fluid collection in the left iliac area and some fluid bilaterally around the bladder neck with some associated air that could be post surgical or possibly a small urine leak or evolving abscess.  Delayed films were done and the ureters were seen to the bladder but no contrast was in the bladder so assessing for a urine leak was not complete.  IR was consulted and felt the collection would be very difficult to drain.   He had a scrotal US which showed no evidence of epididymitis.   He has a leukocytosis and the UA has >50 WBC and RBC's.   He is clinically improving on antibiotic therapy and has received Ceftriaxone, Cefipime and Metronidazole.   He is a diabetic and his control is poor at this time.  ROS:  Review of Systems  Constitutional: Positive for chills and fever.  Gastrointestinal: Positive for abdominal pain.  All other systems reviewed and are negative.   Allergies  Allergen Reactions  . Vancomycin Shortness Of Breath  . Lisinopril Rash    Past Medical History:  Diagnosis Date  . AICD (automatic  cardioverter/defibrillator) present 2011  . Asthma   . Cancer [facility])    prostate  . CHF (congestive heart failure) ([place])   . Coronary artery disease   . Diabetes mellitus without complication ([place])   . Dyspnea   . ED (erectile dysfunction)   . GERD (gastroesophageal reflux disease)   . Hyperlipidemia   . Hypertension   . Ischemic cardiomyopathy   . LADA (latent autoimmune diabetes in adults), managed as type 1 ([place])   . Myocardial infarction [facility]) 2011   anterior MI at [place] s/p BMS to ostial/proximal LAD  . Neuromuscular disorder ([place])    problems in arms. finger tips have been numb/tingling. voltaren works  . Obstructive sleep apnea 2018   cpap. has not used lately. uses a breathe-right strip on nose  . Seizures ([place])    diabetes seizures (low blood sugar); last one 08/2018  . Systolic heart failure ([place])   . Vitamin D deficiency     Past Surgical History:  Procedure Laterality Date  . CARDIAC DEFIBRILLATOR PLACEMENT  08/30/2010  . CHOLECYSTECTOMY N/A 11/07/2017   Procedure: LAPAROSCOPIC CHOLECYSTECTOMY;  Surgeon: Herbert Pun, MD;  Location: [facility] ORS;  Service: General;  Laterality: N/A;  . CORONARY ANGIOPLASTY  2011   stent placed x 1  . CYSTOSCOPY N/A 01/03/2020   Procedure: CYSTOSCOPY  FLEXIBLE;  Surgeon: Hollice Espy, MD;  Location: [facility] ORS;  Service: Urology;  Laterality: N/A;  . ESOPHAGOGASTRODUODENOSCOPY (EGD) WITH PROPOFOL N/A 03/20/2018   Procedure: ESOPHAGOGASTRODUODENOSCOPY (EGD) WITH PROPOFOL;  Surgeon: Manya Silvas, MD;  Location: [facility] ENDOSCOPY;  Service: Endoscopy;  Laterality: N/A;  . LEFT HEART CATH AND CORONARY ANGIOGRAPHY N/A 11/23/2016   Procedure: Left Heart Cath and Coronary Angiography;  Surgeon: Nelva Bush, MD;  Location: [place] CV LAB;  Service: Cardiovascular;  Laterality: N/A;  . PROSTATE BIOPSY  2019  . PROSTATE BIOPSY N/A 10/23/2019   Procedure: PROSTATE BIOPSY;  Surgeon: Abbie Sons, MD;  Location: [facility] ORS;   Service: Urology;  Laterality: N/A;  . ROBOT ASSISTED LAPAROSCOPIC RADICAL PROSTATECTOMY N/A 01/03/2020   Procedure: XI ROBOTIC ASSISTED LAPAROSCOPIC RADICAL PROSTATECTOMY;  Surgeon: Hollice Espy, MD;  Location: [facility] ORS;  Service: Urology;  Laterality: N/A;  . TRANSRECTAL ULTRASOUND N/A 10/23/2019   Procedure: TRANSRECTAL ULTRASOUND;  Surgeon: Abbie Sons, MD;  Location: [facility] ORS;  Service: Urology;  Laterality: N/A;  . WRIST SURGERY Right 2008    Social History   Socioeconomic History  . Marital status: Divorced    Spouse name: Not on file  . Number of children: Not on file  . Years of education: Not on file  . Highest education level: Not on file  Occupational History  . Occupation: Glass blower/designer  Tobacco Use  . Smoking status: Never Smoker  . Smokeless tobacco: Never Used  Substance and Sexual Activity  . Alcohol use: No  . Drug use: No  . Sexual activity: Yes  Other Topics Concern  . Not on file  Social History Narrative  . Not on file   Social Determinants of Health   Financial Resource Strain:   . Difficulty of Paying Living Expenses:   Food Insecurity:   . Worried About Charity fundraiser in the Last Year:   . Arboriculturist in the Last Year:   Transportation Needs:   . Film/video editor (Medical):   Marland Kitchen Lack of Transportation (Non-Medical):   Physical Activity:   . Days of Exercise per Week:   . Minutes of Exercise per Session:   Stress:   . Feeling of Stress :   Social Connections:   . Frequency of Communication with Friends and Family:   . Frequency of Social Gatherings with Friends and Family:   . Attends Religious Services:   . Active Member of Clubs or Organizations:   . Attends Archivist Meetings:   Marland Kitchen Marital Status:   Intimate Partner Violence:   . Fear of Current or Ex-Partner:   . Emotionally Abused:   Marland Kitchen Physically Abused:   . Sexually Abused:     Family History  Problem Relation Age of Onset  . Hypertension  Mother   . Heart attack Paternal Grandmother   . Heart attack Father   . Bladder Cancer Neg Hx   . Kidney cancer Neg Hx   . Prostate cancer Neg Hx     Anti-infectives: Anti-infectives (From admission, onward)   Start     Dose/Rate Route Frequency Ordered Stop   01/12/20 0600  ceFEPIme (MAXIPIME) 2 g in sodium chloride 0.9 % 100 mL IVPB     2 g 200 mL/hr over 30 Minutes Intravenous Every 8 hours 01/12/20 0408     01/12/20 0500  cefTRIAXone (ROCEPHIN) 1 g in sodium chloride 0.9 % 100 mL IVPB  Status:  Discontinued     1  g 200 mL/hr over 30 Minutes Intravenous  Once 01/12/20 0338 01/12/20 0407   01/12/20 0345  metroNIDAZOLE (FLAGYL) IVPB 500 mg     500 mg 100 mL/hr over 60 Minutes Intravenous  Once 01/12/20 0338 01/12/20 0739   01/12/20 0045  cefTRIAXone (ROCEPHIN) 1 g in sodium chloride 0.9 % 100 mL IVPB     1 g 200 mL/hr over 30 Minutes Intravenous  Once 01/12/20 0037 01/12/20 0147      Current Facility-Administered Medications  Medication Dose Route Frequency Provider Last Rate Last Admin  . 0.9 %  sodium chloride infusion   Intravenous Continuous Athena Masse, MD 50 mL/hr at 01/12/20 0522 New Bag at 01/12/20 0522  . acetaminophen (TYLENOL) tablet 650 mg  650 mg Oral Q6H PRN Athena Masse, MD   650 mg at 01/12/20 1210   Or  . acetaminophen (TYLENOL) suppository 650 mg  650 mg Rectal Q6H PRN Athena Masse, MD      . albuterol (PROVENTIL) (2.5 MG/3ML) 0.083% nebulizer solution 2.5 mg  2.5 mg Nebulization Q6H PRN Dhungel, Nishant, MD      . budesonide (PULMICORT) nebulizer solution 1 mg  1 mg Nebulization Daily PRN Dhungel, Nishant, MD      . carvedilol (COREG) tablet 25 mg  25 mg Oral BID Dhungel, Nishant, MD   Stopped at 01/12/20 1436  . ceFEPIme (MAXIPIME) 2 g in sodium chloride 0.9 % 100 mL IVPB  2 g Intravenous Q8H Hall, Scott A, RPH 200 mL/hr at 01/12/20 1438 2 g at 01/12/20 1438  . Chlorhexidine Gluconate Cloth 2 % PADS 6 each  6 each Topical Daily Dhungel, Nishant, MD       . digoxin (LANOXIN) tablet 125 mcg  125 mcg Oral Daily Dhungel, Nishant, MD   125 mcg at 01/12/20 1435  . docusate sodium (COLACE) capsule 100 mg  100 mg Oral BID Dhungel, Nishant, MD      . enoxaparin (LOVENOX) injection 40 mg  40 mg Subcutaneous Q24H Judd Gaudier V, MD   40 mg at 01/12/20 0805  . furosemide (LASIX) tablet 20 mg  20 mg Oral BID Dhungel, Nishant, MD      . HYDROcodone-acetaminophen (NORCO/VICODIN) 5-325 MG per tablet 1-2 tablet  1-2 tablet Oral Q4H PRN Athena Masse, MD   1 tablet at 01/12/20 1044  . insulin aspart (novoLOG) injection 0-15 Units  0-15 Units Subcutaneous TID WC Athena Masse, MD   2 Units at 01/12/20 1211  . insulin aspart (novoLOG) injection 0-5 Units  0-5 Units Subcutaneous QHS Judd Gaudier V, MD      . insulin aspart (novoLOG) injection 15 Units  15 Units Subcutaneous TID WC Dhungel, Nishant, MD      . Derrill Memo ON 01/13/2020] insulin glargine (LANTUS) injection 44 Units  44 Units Subcutaneous Daily Dhungel, Nishant, MD      . losartan (COZAAR) tablet 100 mg  100 mg Oral QHS Dhungel, Nishant, MD      . montelukast (SINGULAIR) tablet 10 mg  10 mg Oral QHS Dhungel, Nishant, MD      . morphine 2 MG/ML injection 2 mg  2 mg Intravenous Q2H PRN Athena Masse, MD   2 mg at 01/12/20 0807  . ondansetron (ZOFRAN) tablet 4 mg  4 mg Oral Q6H PRN Athena Masse, MD       Or  . ondansetron [facility]) injection 4 mg  4 mg Intravenous Q6H PRN Athena Masse, MD      .  oxybutynin (DITROPAN) tablet 5 mg  5 mg Oral Q8H PRN Dhungel, Nishant, MD      . pantoprazole (PROTONIX) EC tablet 40 mg  40 mg Oral Daily Dhungel, Nishant, MD   40 mg at 01/12/20 1440  . senna-docusate (Senokot-S) tablet 1 tablet  1 tablet Oral QHS PRN Athena Masse, MD      . spironolactone (ALDACTONE) tablet 12.5 mg  12.5 mg Oral Daily Dhungel, Nishant, MD   12.5 mg at 01/12/20 1435     Objective: Vital signs in last 24 hours: BP 136/69 (BP Location: Right Arm)   Pulse (!) 102   Temp 99.1 F  (37.3 C) (Oral)   Resp 18   Ht 6\' 1"  (1.854 m)   Wt 93.9 kg   SpO2 97%   BMI 27.31 kg/m   Intake/Output from previous day: 04/02 0701 - 04/03 0700 In: 1100 [IV Piggyback:1100] Out: -  Intake/Output this shift: Total I/O In: 200 [IV Piggyback:200] Out: -    Physical Exam Vitals reviewed.  Constitutional:      Appearance: Normal appearance.  Cardiovascular:     Rate and Rhythm: Regular rhythm. Tachycardia present.  Pulmonary:     Effort: Pulmonary effort is normal. No respiratory distress.  Abdominal:     Palpations: Abdomen is soft.     Tenderness: There is abdominal tenderness (mild lower abdominal tenderness. ).     Comments: Incisions are intact without erythema.   Musculoskeletal:        General: No swelling or tenderness. Normal range of motion.  Skin:    General: Skin is warm and dry.  Neurological:     General: No focal deficit present.     Mental Status: He is alert and oriented to person, place, and time.  Psychiatric:        Mood and Affect: Mood normal.        Behavior: Behavior normal.     Lab Results:  Results for orders placed or performed during the hospital encounter of 01/11/20 (from the past 24 hour(s))  Comprehensive metabolic panel     Status: Abnormal   Collection Time: 01/11/20 10:53 PM  Result Value Ref Range   Sodium 134 (L) 135 - 145 mmol/L   Potassium 4.1 3.5 - 5.1 mmol/L   Chloride 102 98 - 111 mmol/L   CO2 25 22 - 32 mmol/L   Glucose, Bld 337 (H) 70 - 99 mg/dL   BUN 16 6 - 20 mg/dL   Creatinine, Ser 0.91 0.61 - 1.24 mg/dL   Calcium 8.3 (L) 8.9 - 10.3 mg/dL   Total Protein 6.7 6.5 - 8.1 g/dL   Albumin 3.2 (L) 3.5 - 5.0 g/dL   AST 21 15 - 41 U/L   ALT 24 0 - 44 U/L   Alkaline Phosphatase 78 38 - 126 U/L   Total Bilirubin 1.2 0.3 - 1.2 mg/dL   GFR calc non Af Amer >60 >60 mL/min   GFR calc Af Amer >60 >60 mL/min   Anion gap 7 5 - 15  Lactic acid, plasma     Status: None   Collection Time: 01/11/20 10:53 PM  Result Value Ref  Range   Lactic Acid, Venous 1.1 0.5 - 1.9 mmol/L  CBC with Differential     Status: Abnormal   Collection Time: 01/11/20 10:53 PM  Result Value Ref Range   WBC 16.1 (H) 4.0 - 10.5 K/uL   RBC 4.50 4.22 - 5.81 MIL/uL   Hemoglobin 13.7 13.0 -  17.0 g/dL   HCT 39.5 39.0 - 52.0 %   MCV 87.8 80.0 - 100.0 fL   MCH 30.4 26.0 - 34.0 pg   MCHC 34.7 30.0 - 36.0 g/dL   RDW 12.1 11.5 - 15.5 %   Platelets 187 150 - 400 K/uL   nRBC 0.0 0.0 - 0.2 %   Neutrophils Relative % 76 %   Neutro Abs 12.3 (H) 1.7 - 7.7 K/uL   Lymphocytes Relative 8 %   Lymphs Abs 1.3 0.7 - 4.0 K/uL   Monocytes Relative 10 %   Monocytes Absolute 1.6 (H) 0.1 - 1.0 K/uL   Eosinophils Relative 5 %   Eosinophils Absolute 0.8 (H) 0.0 - 0.5 K/uL   Basophils Relative 0 %   Basophils Absolute 0.0 0.0 - 0.1 K/uL   Immature Granulocytes 1 %   Abs Immature Granulocytes 0.08 (H) 0.00 - 0.07 K/uL  Protime-INR     Status: None   Collection Time: 01/11/20 10:53 PM  Result Value Ref Range   Prothrombin Time 14.9 11.4 - 15.2 seconds   INR 1.2 0.8 - 1.2  Culture, blood (Routine x 2)     Status: None (Preliminary result)   Collection Time: 01/11/20 10:53 PM   Specimen: Blood  Result Value Ref Range   Specimen Description BLOOD LEFT HAND    Special Requests      BOTTLES DRAWN AEROBIC AND ANAEROBIC Blood Culture results may not be optimal due to an excessive volume of blood received in culture bottles   Culture      NO GROWTH < 12 HOURS Performed at [facility], [address] [place], [place] [address]    Report Status PENDING   Culture, blood (Routine x 2)     Status: None (Preliminary result)   Collection Time: 01/11/20 11:17 PM   Specimen: Blood  Result Value Ref Range   Specimen Description BLOOD LEFT WRIST    Special Requests      BOTTLES DRAWN AEROBIC AND ANAEROBIC Blood Culture results may not be optimal due to an inadequate volume of blood received in culture bottles   Culture      NO GROWTH < 12  HOURS Performed at [facility], [address] [place], [place] [address]    Report Status PENDING   Urinalysis, Complete w Microscopic     Status: Abnormal   Collection Time: 01/12/20 12:10 AM  Result Value Ref Range   Color, Urine AMBER (A) YELLOW   APPearance CLOUDY (A) CLEAR   Specific Gravity, Urine 1.039 (H) 1.005 - 1.030   pH 5.0 5.0 - 8.0   Glucose, UA >=500 (A) NEGATIVE mg/dL   Hgb urine dipstick LARGE (A) NEGATIVE   Bilirubin Urine NEGATIVE NEGATIVE   Ketones, ur 5 (A) NEGATIVE mg/dL   Protein, ur 100 (A) NEGATIVE mg/dL   Nitrite NEGATIVE NEGATIVE   Leukocytes,Ua LARGE (A) NEGATIVE   RBC / HPF >50 (H) 0 - 5 RBC/hpf   WBC, UA >50 (H) 0 - 5 WBC/hpf   Bacteria, UA NONE SEEN NONE SEEN   Squamous Epithelial / LPF NONE SEEN 0 - 5   WBC Clumps PRESENT    Mucus PRESENT   Respiratory Panel by RT PCR (Flu A&B, Covid) - Nasopharyngeal Swab     Status: None   Collection Time: 01/12/20  1:49 AM   Specimen: Nasopharyngeal Swab  Result Value Ref Range   SARS Coronavirus 2 by RT PCR NEGATIVE NEGATIVE   Influenza A by PCR  NEGATIVE NEGATIVE   Influenza B by PCR NEGATIVE NEGATIVE  HIV Antibody (routine testing w rflx)     Status: None   Collection Time: 01/12/20  5:15 AM  Result Value Ref Range   HIV Screen 4th Generation wRfx NON REACTIVE NON REACTIVE  CBC     Status: Abnormal   Collection Time: 01/12/20  5:15 AM  Result Value Ref Range   WBC 15.9 (H) 4.0 - 10.5 K/uL   RBC 4.28 4.22 - 5.81 MIL/uL   Hemoglobin 13.0 13.0 - 17.0 g/dL   HCT 39.1 39.0 - 52.0 %   MCV 91.4 80.0 - 100.0 fL   MCH 30.4 26.0 - 34.0 pg   MCHC 33.2 30.0 - 36.0 g/dL   RDW 12.2 11.5 - 15.5 %   Platelets 154 150 - 400 K/uL   nRBC 0.0 0.0 - 0.2 %  Creatinine, serum     Status: None   Collection Time: 01/12/20  5:15 AM  Result Value Ref Range   Creatinine, Ser 0.85 0.61 - 1.24 mg/dL   GFR calc non Af Amer >60 >60 mL/min   GFR calc Af Amer >60 >60 mL/min  Glucose, capillary     Status: Abnormal    Collection Time: 01/12/20  7:59 AM  Result Value Ref Range   Glucose-Capillary 223 (H) 70 - 99 mg/dL  Glucose, capillary     Status: Abnormal   Collection Time: 01/12/20 11:42 AM  Result Value Ref Range   Glucose-Capillary 146 (H) 70 - 99 mg/dL   Comment 1 Notify RN     BMET Recent Labs    01/11/20 2253 01/12/20 0515  NA 134*  --   K 4.1  --   CL 102  --   CO2 25  --   GLUCOSE 337*  --   BUN 16  --   CREATININE 0.91 0.85  CALCIUM 8.3*  --    PT/INR Recent Labs    01/11/20 2253  LABPROT 14.9  INR 1.2   ABG No results for input(s): PHART, HCO3 in the last 72 hours.  Invalid input(s): PCO2, PO2  Studies/Results: DG Chest 2 View  Result Date: 01/11/2020 CLINICAL DATA:  Sepsis. EXAM: CHEST - 2 VIEW COMPARISON:  09/12/2019 FINDINGS: There is a left-sided ICD in place. There are trace bilateral pleural effusions. There is no pneumothorax. No large focal infiltrate. No acute osseous abnormality. There is a mild amount of atelectasis at the lung bases. IMPRESSION: Trace bilateral pleural effusions with mild bibasilar atelectasis. Electronically Signed   By: Constance Holster M.D.   On: 01/11/2020 23:02   CT ABDOMEN PELVIS W CONTRAST  Result Date: 01/12/2020 CLINICAL DATA:  Abdominal pain. Fever postop. Recent prostate surgery. Hematuria. EXAM: CT ABDOMEN AND PELVIS WITH CONTRAST TECHNIQUE: Multidetector CT imaging of the abdomen and pelvis was performed using the standard protocol following bolus administration of intravenous contrast. CONTRAST:  16mL OMNIPAQUE IOHEXOL 300 MG/ML  SOLN COMPARISON:  CT dated December 28, 2019 FINDINGS: Lower chest: There are trace bilateral pleural effusions.The heart size is normal. Hepatobiliary: The liver is normal. Status post cholecystectomy.There is no biliary ductal dilation. Pancreas: Normal contours without ductal dilatation. No peripancreatic fluid collection. Spleen: No splenic laceration or hematoma. Adrenals/Urinary Tract: --Adrenal  glands: No adrenal hemorrhage. --Right kidney/ureter: No hydronephrosis or perinephric hematoma. --Left kidney/ureter: No hydronephrosis or perinephric hematoma. --Urinary bladder: There is mild diffuse bladder wall thickening. There is air within the urinary bladder. Stomach/Bowel: --Stomach/Duodenum: No hiatal hernia or other gastric abnormality. Normal  duodenal course and caliber. --Small bowel: No dilatation or inflammation. --Colon: No focal abnormality. --Appendix: Normal. Vascular/Lymphatic: Normal course and caliber of the major abdominal vessels. --there are few mildly prominent retroperitoneal lymph nodes that are non pathologically enlarged. --No mesenteric lymphadenopathy. --No pelvic or inguinal lymphadenopathy. Reproductive: The patient is now status post prostatectomy. In the left hemipelvis there is an air and fluid collection measuring approximately 4.3 x 2.3 by 4.7 cm (axial series 2, image 74). This may represent a developing abscess in the appropriate clinical setting. There are additional pockets of gas in the low anterior pelvis that are presumably postsurgical in etiology. There are inflammatory changes in the patient's pelvis presumably reactive. There is a low-attenuation fluid collection along the patient's left pelvic sidewall measuring approximately 5.5 x 2.6 cm. This is new since the patient's recent prior CT and may represent a postoperative seroma, hematoma, or developing lymphocele. Other: There is some mild fat stranding the patient's anterior abdominal wall, specifically about the umbilicus. There is a small associated pockets of gas at this level. These findings are likely related to the patient's recent surgical intervention. Musculoskeletal. No acute displaced fractures. IMPRESSION: 1. There is an air and fluid collection in the left hemipelvis measuring approximately 4.3 x 2.3 by 4.7 cm. This may represent a developing abscess in the appropriate clinical setting. 2. There is a  low-attenuation fluid collection along the left pelvic sidewall measuring approximately 5.5 x 2.6 cm. This is new since the patient's recent prior CT and may represent a postoperative seroma, hematoma, or developing lymphocele. 3. Postop changes are noted related to the patient's interval prostatectomy. 4. Mild bladder wall thickening which may be reactive. However correlation with urinalysis is recommended. There is a small volume of air within the urinary bladder likely related to instrumentation. 5. Trace bilateral pleural effusions. Electronically Signed   By: Constance Holster M.D.   On: 01/12/2020 02:38   US SCROTUM W/DOPPLER  Result Date: 01/12/2020 CLINICAL DATA:  Scrotal pain. Recent surgery for prostatic carcinoma EXAM: SCROTAL ULTRASOUND DOPPLER ULTRASOUND OF THE TESTICLES TECHNIQUE: Complete ultrasound examination of the testicles, epididymis, and other scrotal structures was performed. Color and spectral Doppler ultrasound were also utilized to evaluate blood flow to the testicles. COMPARISON:  None. FINDINGS: Right testicle Measurements: 2.9 x 2.7 x 2.6 cm. There are scattered tiny calcifications in the right testis. A scrotolith measuring 3 mm is seen immediately adjacent to the right testis. There is an area of somewhat wedge-shaped decreased echogenicity in the periphery of the right testis which appears ill-defined. A well-defined mass is not seen in the right testis. Left testicle Measurements: 4.1 x 2.5 x 2.7 cm. There is slight microlithiasis. There is a somewhat ill-defined area of wedge-shaped decreased echogenicity in the periphery of the left testis which appears essentially identical to the right side. Well-defined mass not seen. Right epididymis: Normal in size and appearance. No evident hyperemia Left epididymis: Normal in size and appearance. No evident hyperemia Hydrocele: Small hydrocele on each side. A small amount of debris is noted in each hydrocele. Varicocele:  None  visualized. Pulsed Doppler interrogation of both testes demonstrates normal low resistance arterial and venous waveforms bilaterally. No scrotal wall thickening or scrotal abscess evident. IMPRESSION: 1. Testes appear symmetric bilaterally. There is decreased echogenicity in a somewhat wedge-shaped manner along the periphery of each testis, identical between left and right sides. This striking symmetry would be most unusual for mass. There is no abnormal vascularity in this area. Suspect anatomic variant.  No evident orchitis or testicular torsion on either side. 2. 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 Current literature suggests that testicular microlithiasis is not a significant independent risk factor for development of testicular carcinoma, and that follow up imaging is not warranted in the absence of other risk factors. Monthly testicular self-examination and annual physical exams are considered appropriate surveillance. If patient has other risk factors for testicular carcinoma, then referral to Urology should be considered. (Reference: DeCastro, et al.: A 5-Year Follow up Study of Asymptomatic Men with Testicular Microlithiasis. J Urol 2008; [id number].) 3. Small scrotolith immediately adjacent to the right testis, a benign finding. 4. Small hydrocele on each side with mild debris in each hydrocele. This debris could be a result of recent surgery in the nearby prostatic region. Residua of hemorrhage or infection could present in this manner. There is no evidence of abscess. 5.  No demonstrable epididymitis or epididymal mass on either side. Electronically Signed   By: Lowella Grip III M.D.   On: 01/12/2020 09:38   I have discussed the case with Dr. Clementeen Graham and reviewed the pertinent labs, images and notes.    Assessment/Plan: Fever and chills following foley removal from RALP with pelvic fluid collection.   I discussed foley replacement to decompress the bladder should this be related  to a small urine leak but he is very reluctant to consider that and is voiding well with a minimal PVR.  Since he appears to be improving with antibiotic therapy, I think we can monitor him with repeat imaging in the next 24-48hrs depending on his clinical condition.  Subsequent imaging should include bladder delays or possibly a CT cystogram to assess the anastamosis.   I will continue to follow.       CC: Dr. Flonnie Overman Dhungel and Dr. Hollice Espy.      Irine Seal 01/12/2020 [phone]

## 2020-01-12 NOTE — ED Notes (Signed)
RN attempted to call to verify who was accepting RN and bed readiness however RN DeeDee informed RN Ali Lowe that assignment modifications are still being made and that Accepting RN will call RN Jovan Schickling ASAP.

## 2020-01-12 NOTE — ED Notes (Signed)
Admitting MD at bedside.

## 2020-01-12 NOTE — ED Notes (Signed)
Bladder scan  48 ml

## 2020-01-12 NOTE — ED Notes (Signed)
Patient to CT scan at this time

## 2020-01-12 NOTE — ED Provider Notes (Signed)
[facility] Emergency Department Provider Note  ____________________________________________  Time seen: Approximately 3:54 AM  I have reviewed the triage vital signs and the nursing notes.   HISTORY  Chief Complaint Fever and Post-op Problem    HPI Joseph [name] is a 49 y.o. male with a history of hypertension, diabetes, recent robotic assisted radical prostatectomy 1 week ago who reports developing hematuria, body aches, fevers and chills with a temperature of 102 at home over the last 24 hours.  Denies chest pain shortness of breath or cough.  Does have low abdominal pain and dysuria.  Symptoms are waxing and waning, no alleviating factors, moderate intensity.   Had negative Covid test before his surgery.  Has not had Covid vaccine yet.   Past Medical History:  Diagnosis Date  . AICD (automatic cardioverter/defibrillator) present 2011  . Asthma   . Cancer [facility])    prostate  . CHF (congestive heart failure) ([place])   . Coronary artery disease   . Diabetes mellitus without complication ([place])   . Dyspnea   . ED (erectile dysfunction)   . GERD (gastroesophageal reflux disease)   . Hyperlipidemia   . Hypertension   . Ischemic cardiomyopathy   . LADA (latent autoimmune diabetes in adults), managed as type 1 ([place])   . Myocardial infarction [facility]) 2011   anterior MI at [place] s/p BMS to ostial/proximal LAD  . Neuromuscular disorder ([place])    problems in arms. finger tips have been numb/tingling. voltaren works  . Obstructive sleep apnea 2018   cpap. has not used lately. uses a breathe-right strip on nose  . Seizures ([place])    diabetes seizures (low blood sugar); last one 08/2018  . Systolic heart failure ([place])   . Vitamin D deficiency      Patient Active Problem List   Diagnosis Date Noted  . CAD (coronary artery disease) 01/12/2020  . Hyperglycemia due to type 2 diabetes mellitus ([place]) 01/12/2020  . S/P prostatectomy 01/12/2020  . Severe asthma  without complication [id number]  . UTI (urinary tract infection) 01/12/2020  . Suspect Postprocedural intraabdominal abscess 01/12/2020  . Prostate cancer ([place]) 01/03/2020  . 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 Elevated PSA 07/25/2017  . Erectile dysfunction associated with type 2 diabetes mellitus ([place]) 07/25/2017  . Chronic systolic CHF (congestive heart failure) ([place]) 11/24/2016  . Shortness of breath   . Abnormal EKG   . DKA (diabetic ketoacidoses) ([place]) 04/18/2016  . CAP (community acquired pneumonia) 04/18/2016  . Accelerated hypertension 04/18/2016  . Abdominal pain 04/18/2016     Past Surgical History:  Procedure Laterality Date  . CARDIAC DEFIBRILLATOR PLACEMENT  08/30/2010  . CHOLECYSTECTOMY N/A 11/07/2017   Procedure: LAPAROSCOPIC CHOLECYSTECTOMY;  Surgeon: Herbert Pun, MD;  Location: [facility] ORS;  Service: General;  Laterality: N/A;  . CORONARY ANGIOPLASTY  2011   stent placed x 1  . CYSTOSCOPY N/A 01/03/2020   Procedure: CYSTOSCOPY FLEXIBLE;  Surgeon: Hollice Espy, MD;  Location: [facility] ORS;  Service: Urology;  Laterality: N/A;  . ESOPHAGOGASTRODUODENOSCOPY (EGD) WITH PROPOFOL N/A 03/20/2018   Procedure: ESOPHAGOGASTRODUODENOSCOPY (EGD) WITH PROPOFOL;  Surgeon: Manya Silvas, MD;  Location: [facility] ENDOSCOPY;  Service: Endoscopy;  Laterality: N/A;  . LEFT HEART CATH AND CORONARY ANGIOGRAPHY N/A 11/23/2016   Procedure: Left Heart Cath and Coronary Angiography;  Surgeon: Nelva Bush, MD;  Location: [place] CV LAB;  Service: Cardiovascular;  Laterality: N/A;  . PROSTATE BIOPSY  2019  . PROSTATE BIOPSY N/A 10/23/2019   Procedure: PROSTATE BIOPSY;  Surgeon: Abbie Sons, MD;  Location: [facility] ORS;  Service: Urology;  Laterality: N/A;  . ROBOT ASSISTED LAPAROSCOPIC RADICAL PROSTATECTOMY N/A 01/03/2020   Procedure: XI ROBOTIC ASSISTED LAPAROSCOPIC RADICAL PROSTATECTOMY;  Surgeon: Hollice Espy, MD;  Location: [facility] ORS;  Service: Urology;  Laterality: N/A;   . TRANSRECTAL ULTRASOUND N/A 10/23/2019   Procedure: TRANSRECTAL ULTRASOUND;  Surgeon: Abbie Sons, MD;  Location: [facility] ORS;  Service: Urology;  Laterality: N/A;  . WRIST SURGERY Right 2008     Prior to Admission medications   Medication Sig Start Date End Date Taking? Authorizing Provider  albuterol (PROVENTIL HFA) 108 (90 Base) MCG/ACT inhaler Inhale 2 puffs into the lungs every 4 (four) hours as needed for wheezing or shortness of breath. 10/08/18   Carrie Mew, MD  aspirin EC 81 MG tablet Take 81 mg by mouth every morning.    [provider]  budesonide (PULMICORT) 1 MG/2ML nebulizer solution Inhale 1 mg into the lungs daily. Pt has not gotten Rx filled as of 12-31-19 11/27/19 11/26/20  [provider]  carvedilol (COREG) 25 MG tablet Take 1 tablet (25 mg total) by mouth 2 (two) times daily. 12/01/19   Carrie Mew, MD  Cholecalciferol (D3-1000) 1000 units capsule Take 1,000 Units by mouth daily.    [provider]  Continuous Blood Gluc Receiver (FREESTYLE LIBRE 14 DAY READER) DEVI Use 1 Device continuously as needed 02/07/18   [provider]  Continuous Blood Gluc Sensor (FREESTYLE LIBRE 14 DAY SENSOR) MISC Use 1 Device every 14 (fourteen) days 02/07/18   [provider]  digoxin (LANOXIN) 0.125 MG tablet Take 1 tablet (125 mcg total) by mouth daily. Patient taking differently: Take 125 mcg by mouth every morning.  12/01/19   Carrie Mew, MD  docusate sodium (COLACE) 100 MG capsule Take 1 capsule (100 mg total) by mouth 2 (two) times daily. 01/04/20   Vaillancourt, Samantha, PA-C  fexofenadine-pseudoephedrine (ALLEGRA-D) 60-120 MG 12 hr tablet Take 1 tablet by mouth 2 (two) times daily. Patient taking differently: Take 1 tablet by mouth as needed.  09/12/19   Sable Feil, PA-C  fluticasone (FLONASE) 50 MCG/ACT nasal spray Place 2 sprays into both nostrils daily as needed for allergies.  01/26/16   [provider]   furosemide (LASIX) 20 MG tablet Take 1 tablet (20 mg total) by mouth 2 (two) times daily. 12/01/19   Carrie Mew, MD  glucagon 1 MG injection Inject into the muscle. 02/07/18   [provider]  glucagon, human recombinant, (GLUCAGEN DIAGNOSTIC) 1 MG injection Inject into the muscle. 02/07/18   [provider]  HUMALOG KWIKPEN 100 UNIT/ML KwikPen Inject 0.18-0.22 mLs (18-22 Units total) into the skin See admin instructions. Inject 18 units subcutaneous in the morning, inject 20 units subcutaneous at lunchtime, and inject 22 units subcutaneous at night. Plus sliding scale as directed. Patient taking differently: Inject 18-22 Units into the skin See admin instructions. Inject 18 units subcutaneous in the morning, inject 20 units subcutaneous at lunchtime, and inject 22 units subcutaneous before dinner. Plus sliding scale as directed. 12/01/19   Carrie Mew, MD  Insulin Glargine, 2 Unit Dial, (TOUJEO MAX SOLOSTAR) 300 UNIT/ML SOPN Inject 44 Units into the skin daily. Patient taking differently: Inject 44 Units into the skin every morning.  12/01/19   Carrie Mew, MD  Insulin Pen Needle (B-D ULTRAFINE III SHORT PEN) 31G X 8 MM MISC USE 5 TIMES DAILY AS  DIRECTED 12/01/19   Carrie Mew, MD  loratadine [facility])  10 MG tablet Take 10 mg by mouth daily as needed.     [provider]  losartan (COZAAR) 100 MG tablet Take 1 tablet (100 mg total) by mouth at bedtime. 12/01/19   Carrie Mew, MD  montelukast (SINGULAIR) 10 MG tablet Take by mouth. 11/27/19 11/26/20  [provider]  oxybutynin (DITROPAN) 5 MG tablet Take 1 tablet (5 mg total) by mouth every 8 (eight) hours as needed for bladder spasms. 01/04/20   Vaillancourt, Aldona Bar, PA-C  oxyCODONE-acetaminophen (PERCOCET/ROXICET) 5-325 MG tablet Take 1-2 tablets by mouth every 6 (six) hours as needed for moderate pain. 01/04/20   Vaillancourt, Aldona Bar, PA-C  pantoprazole (PROTONIX) 40 MG tablet Take 1  tablet (40 mg total) by mouth daily. Patient taking differently: Take 40 mg by mouth as needed.  04/21/16   [name], MD  spironolactone (ALDACTONE) 25 MG tablet Take 0.5 tablets (12.5 mg total) by mouth daily. 12/01/19   Carrie Mew, MD     Allergies Vancomycin and Lisinopril   Family History  Problem Relation Age of Onset  . Hypertension Mother   . Heart attack Paternal Grandmother   . Heart attack Father   . Bladder Cancer Neg Hx   . Kidney cancer Neg Hx   . Prostate cancer Neg Hx     Social History Social History   Tobacco Use  . Smoking status: Never Smoker  . Smokeless tobacco: Never Used  Substance Use Topics  . Alcohol use: No  . Drug use: No    Review of Systems  Constitutional:   Positive fever and chills.  ENT:   No sore throat. No rhinorrhea. Cardiovascular:   No chest pain or syncope. Respiratory:   No dyspnea or cough. Gastrointestinal:   Positive low abdominal pain without vomiting and diarrhea.  Musculoskeletal:   Negative for focal pain or swelling All other systems reviewed and are negative except as documented above in ROS and HPI.  ____________________________________________   PHYSICAL EXAM:  VITAL SIGNS: ED Triage Vitals [01/11/20 2237]  Enc Vitals Group     BP 119/71     Pulse Rate 94     Resp 18     Temp (!) 100.6 F (38.1 C)     Temp src      SpO2 96 %     Weight 207 lb (93.9 kg)     Height 6\' 1"  (1.854 m)     Head Circumference      Peak Flow      Pain Score 8     Pain Loc      Pain Edu?      Excl. in [place]?     Vital signs reviewed, nursing assessments reviewed.   Constitutional:   Alert and oriented. Non-toxic appearance. Eyes:   Conjunctivae are normal. EOMI. PERRL. ENT      Head:   Normocephalic and atraumatic.      Nose:   Wearing a mask.      Mouth/Throat:   Wearing a mask.      Neck:   No meningismus. Full ROM. Hematological/Lymphatic/Immunilogical:   No cervical  lymphadenopathy. Cardiovascular:   RRR. Symmetric bilateral radial and DP pulses.  No murmurs. Cap refill less than 2 seconds. Respiratory:   Normal respiratory effort without tachypnea/retractions. Breath sounds are clear and equal bilaterally. No wheezes/rales/rhonchi. Gastrointestinal:   Soft with diffuse lower abdominal tenderness. Non distended. There is no CVA tenderness.  No rebound, rigidity, or guarding.  Abdominal incisions well-healed  Musculoskeletal:  Normal range of motion in all extremities. No joint effusions.  No lower extremity tenderness.  No edema.  No calf tenderness.  Negative Bevelyn Buckles' sign Neurologic:   Normal speech and language.  Motor grossly intact. No acute focal neurologic deficits are appreciated.  Skin:    Skin is warm, dry and intact. No rash noted.  No petechiae, purpura, or bullae.  ____________________________________________    LABS (pertinent positives/negatives) (all labs ordered are listed, but only abnormal results are displayed) Labs Reviewed  COMPREHENSIVE METABOLIC PANEL - Abnormal; Notable for the following components:      Result Value   Sodium 134 (*)    Glucose, Bld 337 (*)    Calcium 8.3 (*)    Albumin 3.2 (*)    All other components within normal limits  CBC WITH DIFFERENTIAL/PLATELET - Abnormal; Notable for the following components:   WBC 16.1 (*)    Neutro Abs 12.3 (*)    Monocytes Absolute 1.6 (*)    Eosinophils Absolute 0.8 (*)    Abs Immature Granulocytes 0.08 (*)    All other components within normal limits  URINALYSIS, COMPLETE (UACMP) WITH MICROSCOPIC - Abnormal; Notable for the following components:   Color, Urine AMBER (*)    APPearance CLOUDY (*)    Specific Gravity, Urine 1.039 (*)    Glucose, UA >=500 (*)    Hgb urine dipstick LARGE (*)    Ketones, ur 5 (*)    Protein, ur 100 (*)    Leukocytes,Ua LARGE (*)    RBC / HPF >50 (*)    WBC, UA >50 (*)    All other components within normal limits  RESPIRATORY PANEL BY  RT PCR (FLU A&B, COVID)  CULTURE, BLOOD (ROUTINE X 2)  CULTURE, BLOOD (ROUTINE X 2)  URINE CULTURE  LACTIC ACID, PLASMA  PROTIME-INR  LACTIC ACID, PLASMA  POC SARS CORONAVIRUS 2 AG -  ED   ____________________________________________   EKG    ____________________________________________    RADIOLOGY  DG Chest 2 View  Result Date: 01/11/2020 CLINICAL DATA:  Sepsis. EXAM: CHEST - 2 VIEW COMPARISON:  09/12/2019 FINDINGS: There is a left-sided ICD in place. There are trace bilateral pleural effusions. There is no pneumothorax. No large focal infiltrate. No acute osseous abnormality. There is a mild amount of atelectasis at the lung bases. IMPRESSION: Trace bilateral pleural effusions with mild bibasilar atelectasis. Electronically Signed   By: Constance Holster M.D.   On: 01/11/2020 23:02   CT ABDOMEN PELVIS W CONTRAST  Result Date: 01/12/2020 CLINICAL DATA:  Abdominal pain. Fever postop. Recent prostate surgery. Hematuria. EXAM: CT ABDOMEN AND PELVIS WITH CONTRAST TECHNIQUE: Multidetector CT imaging of the abdomen and pelvis was performed using the standard protocol following bolus administration of intravenous contrast. CONTRAST:  148mL OMNIPAQUE IOHEXOL 300 MG/ML  SOLN COMPARISON:  CT dated December 28, 2019 FINDINGS: Lower chest: There are trace bilateral pleural effusions.The heart size is normal. Hepatobiliary: The liver is normal. Status post cholecystectomy.There is no biliary ductal dilation. Pancreas: Normal contours without ductal dilatation. No peripancreatic fluid collection. Spleen: No splenic laceration or hematoma. Adrenals/Urinary Tract: --Adrenal glands: No adrenal hemorrhage. --Right kidney/ureter: No hydronephrosis or perinephric hematoma. --Left kidney/ureter: No hydronephrosis or perinephric hematoma. --Urinary bladder: There is mild diffuse bladder wall thickening. There is air within the urinary bladder. Stomach/Bowel: --Stomach/Duodenum: No hiatal hernia or other gastric  abnormality. Normal duodenal course and caliber. --Small bowel: No dilatation or inflammation. --Colon: No focal abnormality. --Appendix: Normal. Vascular/Lymphatic: Normal course and caliber of the  major abdominal vessels. --there are few mildly prominent retroperitoneal lymph nodes that are non pathologically enlarged. --No mesenteric lymphadenopathy. --No pelvic or inguinal lymphadenopathy. Reproductive: The patient is now status post prostatectomy. In the left hemipelvis there is an air and fluid collection measuring approximately 4.3 x 2.3 by 4.7 cm (axial series 2, image 74). This may represent a developing abscess in the appropriate clinical setting. There are additional pockets of gas in the low anterior pelvis that are presumably postsurgical in etiology. There are inflammatory changes in the patient's pelvis presumably reactive. There is a low-attenuation fluid collection along the patient's left pelvic sidewall measuring approximately 5.5 x 2.6 cm. This is new since the patient's recent prior CT and may represent a postoperative seroma, hematoma, or developing lymphocele. Other: There is some mild fat stranding the patient's anterior abdominal wall, specifically about the umbilicus. There is a small associated pockets of gas at this level. These findings are likely related to the patient's recent surgical intervention. Musculoskeletal. No acute displaced fractures. IMPRESSION: 1. There is an air and fluid collection in the left hemipelvis measuring approximately 4.3 x 2.3 by 4.7 cm. This may represent a developing abscess in the appropriate clinical setting. 2. There is a low-attenuation fluid collection along the left pelvic sidewall measuring approximately 5.5 x 2.6 cm. This is new since the patient's recent prior CT and may represent a postoperative seroma, hematoma, or developing lymphocele. 3. Postop changes are noted related to the patient's interval prostatectomy. 4. Mild bladder wall thickening  which may be reactive. However correlation with urinalysis is recommended. There is a small volume of air within the urinary bladder likely related to instrumentation. 5. Trace bilateral pleural effusions. Electronically Signed   By: Constance Holster M.D.   On: 01/12/2020 02:38    ____________________________________________   PROCEDURES Procedures  ____________________________________________  DIFFERENTIAL DIAGNOSIS   UTI, pyelonephritis, intra-abdominal abscess  CLINICAL IMPRESSION / ASSESSMENT AND PLAN / ED COURSE  Medications ordered in the ED: Medications  cefTRIAXone (ROCEPHIN) 1 g in sodium chloride 0.9 % 100 mL IVPB (has no administration in time range)  metroNIDAZOLE (FLAGYL) IVPB 500 mg (has no administration in time range)  acetaminophen (TYLENOL) tablet 1,000 mg (1,000 mg Oral Given 01/11/20 2305)  sodium chloride 0.9 % bolus 1,000 mL (0 mLs Intravenous Stopped 01/12/20 0043)  morphine 4 MG/ML injection 4 mg (4 mg Intravenous Given 01/12/20 0009)  ondansetron (ZOFRAN) injection 4 mg (4 mg Intravenous Given 01/12/20 0010)  cefTRIAXone (ROCEPHIN) 1 g in sodium chloride 0.9 % 100 mL IVPB (0 g Intravenous Stopped 01/12/20 0147)  iohexol (OMNIPAQUE) 300 MG/ML solution 100 mL (100 mLs Intravenous Contrast Given 01/12/20 0216)    Pertinent labs & imaging results that were available during my care of the patient were reviewed by me and considered in my medical decision making (see chart for details).  JAIDON SIKES was evaluated in Emergency Department on 01/12/2020 for the symptoms described in the history of present illness. He was evaluated in the context of the global COVID-19 pandemic, which necessitated consideration that the patient might be at risk for infection with the SARS-CoV-2 virus that causes COVID-19. Institutional protocols and algorithms that pertain to the evaluation of patients at risk for COVID-19 are in a state of rapid change based on information released by regulatory  bodies including the CDC and federal and state organizations. These policies and algorithms were followed during the patient's care in the ED.   Patient presents with fever and abdominal pain after  recent prostatectomy.  Physical exam shows low-grade fever and low abdominal tenderness and otherwise unremarkable.  Urinalysis suggestive of UTI.  CT scan obtained which does show a possible abscess in the left pelvis as well as a suspected seroma.  Also evidence of cystitis.  Patient given a total of 2 g IV ceftriaxone as well as IV Flagyl for antibiotic coverage.  Case discussed with the hospitalist for further management.  Covid testing was repeated, PCR negative.  Patient is not septic.     ____________________________________________   FINAL CLINICAL IMPRESSION(S) / ED DIAGNOSES    Final diagnoses:  Postprocedural intraabdominal abscess  Cystitis     ED Discharge Orders    None      Portions of this note were generated with dragon dictation software. Dictation errors may occur despite best attempts at proofreading.   Carrie Mew, MD 01/12/20 [phone]

## 2020-01-12 NOTE — Progress Notes (Addendum)
PROGRESS NOTE                                                                                                                                                                                                             Patient Demographics:    Joseph [name], is a 49 y.o. male, DOB - [birth year], EE:[id number]  Admit date - 01/11/2020   Admitting Physician Athena Masse, MD  Outpatient Primary MD for the patient is [facility] [place]  LOS - 0  Outpatient Specialists: Urology (Dr. Erlene Quan)  Chief Complaint  Patient presents with  . Fever  . Post-op Problem       Brief Narrative 49 year old male with chronic systolic CHF, hypertension, insulin-dependent diabetes mellitus, CAD with history of PCI, history of prostate cancer s/p robotic laparoscopic prostatectomy on 3/25 and discharged on Foley that was removed on 4/1 in the urology office presented to the ED with generalized body ache with chills and fever of 102 F.  Noted blood in urine since his Foley has been removed and also dysuria with pain in his penis and scrotum. In the ED patient was febrile with temperature of 100.6 F, normal blood pressure and sats. Blood work showed WBC 16 K and UA suggestive of UTI.  Patient tested negative for COVID-19.  CT abdomen pelvis showed an air and fluid collection in the left hemipelvis measuring 4.3 x 2.3 x 4.7 cm possible for developing abscess.  Also low-attenuation fluid collection along the left pelvic sidewall measuring 5.5 x 2.6 cm which is new and possible seroma versus hematoma versus developing lymphocele.  Also showed mild bladder wall thickening.  Patient given empiric IV Rocephin and Flagyl and hospitalist consulted for further management.   Subjective:   Patient seen and examined.  Complains of pain in his penile shaft and scrotum.  Also reports having dysuria.   Assessment  & Plan :    Principal  Problem: Severe sepsis ([place]) Likely secondary to UTI (catheter associated) with concern for fluid collection/abscess in the left hemipelvis. On empiric IV cefepime.  Follow urine and blood culture.  Supportive care with Tylenol, Vicodin and morphine for pain. Scrotal ultrasound with Doppler done given complaint of penile and scrotal pain shows testicular microlithiasis bilaterally, small hydrocele on each side without any abscess, epididymitis.  Discussed CT findings with urology (Dr.Wrenn).  Recommends to consult IR for aspiration/drainage of the left pelvic collection/abscess.  Will see the patient today in consult.    Active Problems: Status post recent prostatectomy on 3/25 Followed by urology as outpatient.  Monitor strict I/O and.  A bladder scan as needed.  Chronic systolic CHF Euvolemic.  Continue Lasix, losartan, Aldactone, digoxin and Coreg.  Coronary artery disease Asymptomatic.  Continue aspirin and statin.  Diabetes mellitus type 2, uncontrolled with hyperglycemia on  long-term insulin use A1c of 10.5.  Resume home dose Lantus (44 units daily).  Patient on high-dose Premeal aspart 3 times daily at home.  I will place him on 15 units aspart 3 times daily along with sliding scale coverage.   History of asthma Wheezing on exam.  Continue as needed nebs and home inhaler.   Code Status : Full code  Family Communication  : None  Disposition Plan  : Home pending clinical improvement   Barriers For Discharge : Active sepsis  Consults  : Urology  Procedures  : CT abdomen pelvis, scrotal Doppler  DVT Prophylaxis  : Subcu Lovenox  Lab Results  Component Value Date   PLT 154 01/12/2020    Antibiotics  :    Anti-infectives (From admission, onward)   Start     Dose/Rate Route Frequency Ordered Stop   01/12/20 0600  ceFEPIme (MAXIPIME) 2 g in sodium chloride 0.9 % 100 mL IVPB     2 g 200 mL/hr over 30 Minutes Intravenous Every 8 hours 01/12/20 0408     01/12/20  0500  cefTRIAXone (ROCEPHIN) 1 g in sodium chloride 0.9 % 100 mL IVPB  Status:  Discontinued     1 g 200 mL/hr over 30 Minutes Intravenous  Once 01/12/20 0338 01/12/20 0407   01/12/20 0345  metroNIDAZOLE (FLAGYL) IVPB 500 mg     500 mg 100 mL/hr over 60 Minutes Intravenous  Once 01/12/20 0338 01/12/20 0739   01/12/20 0045  cefTRIAXone (ROCEPHIN) 1 g in sodium chloride 0.9 % 100 mL IVPB     1 g 200 mL/hr over 30 Minutes Intravenous  Once 01/12/20 0037 01/12/20 0147        Objective:   Vitals:   01/12/20 0845 01/12/20 0900 01/12/20 1043 01/12/20 1144  BP:  (!) 149/83 (!) 146/86 (!) 148/80  Pulse: 99 97 (!) 106 (!) 106  Resp: 13 (!) 22 (!) 22 18  Temp:   100 F (37.8 C) (!) 101.1 F (38.4 C)  TempSrc:   Oral Oral  SpO2: 97% 97% 99% 96%  Weight:      Height:        Wt Readings from Last 3 Encounters:  01/11/20 93.9 kg  01/10/20 93.9 kg  12/04/19 94 kg     Intake/Output Summary (Last 24 hours) at 01/12/2020 1205 Last data filed at 01/12/2020 0739 Gross per 24 hour  Intake 1300 ml  Output --  Net 1300 ml     Physical Exam  Gen: not in distress, fatigued HEENT: no pallor, moist mucosa, supple neck Chest: Scattered wheezing bilaterally CVS: N S1&S2, no murmurs, rubs or gallop GI: soft, NT, ND, BS+, no penile or scrotal swelling but tenderness to palpation, no CVA tenderness Musculoskeletal: warm, no edema     Data Review:    CBC Recent Labs  Lab 01/11/20 2253 01/12/20 0515  WBC 16.1* 15.9*  HGB 13.7 13.0  HCT 39.5 39.1  PLT 187 154  MCV 87.8 91.4  MCH 30.4 30.4  MCHC 34.7 33.2  RDW 12.1 12.2  LYMPHSABS 1.3  --   MONOABS 1.6*  --   EOSABS 0.8*  --   BASOSABS 0.0  --     Chemistries  Recent Labs  Lab 01/11/20 2253 01/12/20 0515  NA 134*  --   K 4.1  --   CL 102  --   CO2 25  --   GLUCOSE 337*  --   BUN 16  --   CREATININE 0.91 0.85  CALCIUM 8.3*  --   AST 21  --   ALT 24  --   ALKPHOS 78  --   BILITOT 1.2  --     ------------------------------------------------------------------------------------------------------------------ No results for input(s): CHOL, HDL, LDLCALC, TRIG, CHOLHDL, LDLDIRECT in the last 72 hours.  Lab Results  Component Value Date   HGBA1C 10.5 (H) 01/03/2020   ------------------------------------------------------------------------------------------------------------------ No results for input(s): TSH, T4TOTAL, T3FREE, THYROIDAB in the last 72 hours.  Invalid input(s): FREET3 ------------------------------------------------------------------------------------------------------------------ No results for input(s): VITAMINB12, FOLATE, FERRITIN, TIBC, IRON, RETICCTPCT in the last 72 hours.  Coagulation profile Recent Labs  Lab 01/11/20 2253  INR 1.2    No results for input(s): DDIMER in the last 72 hours.  Cardiac Enzymes No results for input(s): CKMB, TROPONINI, MYOGLOBIN in the last 168 hours.  Invalid input(s): CK ------------------------------------------------------------------------------------------------------------------    Component Value Date/Time   BNP 51.0 02/24/2017 1408    Inpatient Medications  Scheduled Meds: . enoxaparin (LOVENOX) injection  40 mg Subcutaneous Q24H  . insulin aspart  0-15 Units Subcutaneous TID WC  . insulin aspart  0-5 Units Subcutaneous QHS   Continuous Infusions: . sodium chloride 50 mL/hr at 01/12/20 0522  . ceFEPime (MAXIPIME) IV Stopped (01/12/20 0739)   PRN Meds:.acetaminophen **OR** acetaminophen, albuterol, HYDROcodone-acetaminophen, morphine injection, ondansetron **OR** ondansetron (ZOFRAN) IV, senna-docusate  Micro Results Recent Results (from the past 240 hour(s))  Culture, blood (Routine x 2)     Status: None (Preliminary result)   Collection Time: 01/11/20 10:53 PM   Specimen: Blood  Result Value Ref Range Status   Specimen Description BLOOD LEFT HAND  Final   Special Requests   Final    BOTTLES DRAWN  AEROBIC AND ANAEROBIC Blood Culture results may not be optimal due to an excessive volume of blood received in culture bottles   Culture   Final    NO GROWTH < 12 HOURS Performed at [facility], [address], [place], [place] [address]    Report Status PENDING  Incomplete  Culture, blood (Routine x 2)     Status: None (Preliminary result)   Collection Time: 01/11/20 11:17 PM   Specimen: Blood  Result Value Ref Range Status   Specimen Description BLOOD LEFT WRIST  Final   Special Requests   Final    BOTTLES DRAWN AEROBIC AND ANAEROBIC Blood Culture results may not be optimal due to an inadequate volume of blood received in culture bottles   Culture   Final    NO GROWTH < 12 HOURS Performed at [facility], [address] [place], [place] [address]    Report Status PENDING  Incomplete  Respiratory Panel by RT PCR (Flu A&B, Covid) - Nasopharyngeal Swab     Status: None   Collection Time: 01/12/20  1:49 AM   Specimen: Nasopharyngeal Swab  Result Value Ref Range Status   SARS Coronavirus 2 by RT PCR NEGATIVE NEGATIVE Final    Comment: (NOTE) SARS-CoV-2 target nucleic acids are NOT DETECTED. The SARS-CoV-2 RNA is generally detectable in upper  respiratoy specimens during the acute phase of infection. The lowest concentration of SARS-CoV-2 viral copies this assay can detect is 131 copies/mL. A negative result does not preclude SARS-Cov-2 infection and should not be used as the sole basis for treatment or other patient management decisions. A negative result may occur with  improper specimen collection/handling, submission of specimen other than nasopharyngeal swab, presence of viral mutation(s) within the areas targeted by this assay, and inadequate number of viral copies (<131 copies/mL). A negative result must be combined with clinical observations, patient history, and epidemiological information. The expected result is Negative. Fact Sheet for Patients:    PinkCheek.be Fact Sheet for Healthcare Providers:  GravelBags.it This test is not yet ap proved or cleared by the [place] FDA and  has been authorized for detection and/or diagnosis of SARS-CoV-2 by FDA under an Emergency Use Authorization (EUA). This EUA will remain  in effect (meaning this test can be used) for the duration of the COVID-19 declaration under Section 564(b)(1) of the Act, 21 U.S.C. section 360bbb-3(b)(1), unless the authorization is terminated or revoked sooner.    Influenza A by PCR NEGATIVE NEGATIVE Final   Influenza B by PCR NEGATIVE NEGATIVE Final    Comment: (NOTE) The Xpert Xpress SARS-CoV-2/FLU/RSV assay is intended as an aid in  the diagnosis of influenza from Nasopharyngeal swab specimens and  should not be used as a sole basis for treatment. Nasal washings and  aspirates are unacceptable for Xpert Xpress SARS-CoV-2/FLU/RSV  testing. Fact Sheet for Patients: PinkCheek.be Fact Sheet for Healthcare Providers: GravelBags.it This test is not yet approved or cleared by the [place] FDA and  has been authorized for detection and/or diagnosis of SARS-CoV-2 by  FDA under an Emergency Use Authorization (EUA). This EUA will remain  in effect (meaning this test can be used) for the duration of the  Covid-19 declaration under Section 564(b)(1) of the Act, 21  U.S.C. section 360bbb-3(b)(1), unless the authorization is  terminated or revoked. Performed at [facility], [address], [place], [place] [address]     Radiology Reports DG Chest 2 View  Result Date: 01/11/2020 CLINICAL DATA:  Sepsis. EXAM: CHEST - 2 VIEW COMPARISON:  09/12/2019 FINDINGS: There is a left-sided ICD in place. There are trace bilateral pleural effusions. There is no pneumothorax. No large focal infiltrate. No acute osseous abnormality. There is a mild amount  of atelectasis at the lung bases. IMPRESSION: Trace bilateral pleural effusions with mild bibasilar atelectasis. Electronically Signed   By: Constance Holster M.D.   On: 01/11/2020 23:02   CT ABDOMEN PELVIS W CONTRAST  Result Date: 01/12/2020 CLINICAL DATA:  Abdominal pain. Fever postop. Recent prostate surgery. Hematuria. EXAM: CT ABDOMEN AND PELVIS WITH CONTRAST TECHNIQUE: Multidetector CT imaging of the abdomen and pelvis was performed using the standard protocol following bolus administration of intravenous contrast. CONTRAST:  166mL OMNIPAQUE IOHEXOL 300 MG/ML  SOLN COMPARISON:  CT dated December 28, 2019 FINDINGS: Lower chest: There are trace bilateral pleural effusions.The heart size is normal. Hepatobiliary: The liver is normal. Status post cholecystectomy.There is no biliary ductal dilation. Pancreas: Normal contours without ductal dilatation. No peripancreatic fluid collection. Spleen: No splenic laceration or hematoma. Adrenals/Urinary Tract: --Adrenal glands: No adrenal hemorrhage. --Right kidney/ureter: No hydronephrosis or perinephric hematoma. --Left kidney/ureter: No hydronephrosis or perinephric hematoma. --Urinary bladder: There is mild diffuse bladder wall thickening. There is air within the urinary bladder. Stomach/Bowel: --Stomach/Duodenum: No hiatal hernia or other gastric abnormality. Normal duodenal course and caliber. --Small bowel:  No dilatation or inflammation. --Colon: No focal abnormality. --Appendix: Normal. Vascular/Lymphatic: Normal course and caliber of the major abdominal vessels. --there are few mildly prominent retroperitoneal lymph nodes that are non pathologically enlarged. --No mesenteric lymphadenopathy. --No pelvic or inguinal lymphadenopathy. Reproductive: The patient is now status post prostatectomy. In the left hemipelvis there is an air and fluid collection measuring approximately 4.3 x 2.3 by 4.7 cm (axial series 2, image 74). This may represent a developing abscess in  the appropriate clinical setting. There are additional pockets of gas in the low anterior pelvis that are presumably postsurgical in etiology. There are inflammatory changes in the patient's pelvis presumably reactive. There is a low-attenuation fluid collection along the patient's left pelvic sidewall measuring approximately 5.5 x 2.6 cm. This is new since the patient's recent prior CT and may represent a postoperative seroma, hematoma, or developing lymphocele. Other: There is some mild fat stranding the patient's anterior abdominal wall, specifically about the umbilicus. There is a small associated pockets of gas at this level. These findings are likely related to the patient's recent surgical intervention. Musculoskeletal. No acute displaced fractures. IMPRESSION: 1. There is an air and fluid collection in the left hemipelvis measuring approximately 4.3 x 2.3 by 4.7 cm. This may represent a developing abscess in the appropriate clinical setting. 2. There is a low-attenuation fluid collection along the left pelvic sidewall measuring approximately 5.5 x 2.6 cm. This is new since the patient's recent prior CT and may represent a postoperative seroma, hematoma, or developing lymphocele. 3. Postop changes are noted related to the patient's interval prostatectomy. 4. Mild bladder wall thickening which may be reactive. However correlation with urinalysis is recommended. There is a small volume of air within the urinary bladder likely related to instrumentation. 5. Trace bilateral pleural effusions. Electronically Signed   By: Constance Holster M.D.   On: 01/12/2020 02:38   CT HEMATURIA WORKUP  Result Date: 12/28/2019 CLINICAL DATA:  Microscopic hematuria. Stage IIa prostate adenocarcinoma. Mild testicular pain. EXAM: CT ABDOMEN AND PELVIS WITHOUT AND WITH CONTRAST TECHNIQUE: Multidetector CT imaging of the abdomen and pelvis was performed following the standard protocol before and following the bolus  administration of intravenous contrast. CONTRAST:  163mL OMNIPAQUE IOHEXOL 300 MG/ML  SOLN COMPARISON:  None. FINDINGS: Lower chest: Defibrillator lead noted projecting in the right ventricle. Hepatobiliary: Cholecystectomy.  Otherwise unremarkable Pancreas: Unremarkable Spleen: Unremarkable Adrenals/Urinary Tract: Suspected 1-2 mm left mid kidney calculus on image 89/6. Suspected 1-2 mm right kidney lower pole nonobstructive calculus on image 33/5. No hydronephrosis, hydroureter, or ureteral calculus. No significant abnormal renal parenchymal enhancement. No appreciable abnormal enhancement or abnormal filling defect along the urothelium. The adrenal glands appear normal. Stomach/Bowel: Borderline dilated loops of proximal jejunum some demonstrating mild wall thickening. Mildly high position of the cecum on image 52/10. Mildly accentuated enhancement along the rectal wall for example on image 66/7, local inflammation is not excluded. Vascular/Lymphatic: Small periaortic lymph nodes are not pathologically enlarged by size criteria. No pathologic adenopathy is noted. Minimal abdominal aortic atherosclerotic calcification. Reproductive: There is some asymmetric enhancement in the peripheral zone of the right prostate gland compared to the left. Other: No supplemental non-categorized findings. Musculoskeletal: 4 mm sclerotic focus in the left iliac bone on image 50/7 is most likely a bone island or similar benign lesion, although strictly speaking is nonspecific. Degenerative facet arthropathy is observed bilaterally at L5-S1. Small lucent lesion in the left iliac bone on image 45/10 is likely benign. IMPRESSION: 1. No findings of adenopathy  or compelling evidence of osseous metastatic disease. 2. Questionable punctate single nonobstructive bilateral renal calculi. No other cause for hematuria is observed. 3. Asymmetric enhancement in the right peripheral zone of the prostate gland. 4. Borderline dilated loops of  proximal jejunum with some questionable wall thickening. Questionable rectal wall thickening. Query low-grade enterocolitis, correlate with any change in bowel habits. 5. Electronically Signed   By: Van Clines M.D.   On: 12/28/2019 14:58   US SCROTUM W/DOPPLER  Result Date: 01/12/2020 CLINICAL DATA:  Scrotal pain. Recent surgery for prostatic carcinoma EXAM: SCROTAL ULTRASOUND DOPPLER ULTRASOUND OF THE TESTICLES TECHNIQUE: Complete ultrasound examination of the testicles, epididymis, and other scrotal structures was performed. Color and spectral Doppler ultrasound were also utilized to evaluate blood flow to the testicles. COMPARISON:  None. FINDINGS: Right testicle Measurements: 2.9 x 2.7 x 2.6 cm. There are scattered tiny calcifications in the right testis. A scrotolith measuring 3 mm is seen immediately adjacent to the right testis. There is an area of somewhat wedge-shaped decreased echogenicity in the periphery of the right testis which appears ill-defined. A well-defined mass is not seen in the right testis. Left testicle Measurements: 4.1 x 2.5 x 2.7 cm. There is slight microlithiasis. There is a somewhat ill-defined area of wedge-shaped decreased echogenicity in the periphery of the left testis which appears essentially identical to the right side. Well-defined mass not seen. Right epididymis: Normal in size and appearance. No evident hyperemia Left epididymis: Normal in size and appearance. No evident hyperemia Hydrocele: Small hydrocele on each side. A small amount of debris is noted in each hydrocele. Varicocele:  None visualized. Pulsed Doppler interrogation of both testes demonstrates normal low resistance arterial and venous waveforms bilaterally. No scrotal wall thickening or scrotal abscess evident. IMPRESSION: 1. Testes appear symmetric bilaterally. There is decreased echogenicity in a somewhat wedge-shaped manner along the periphery of each testis, identical between left and right sides.  This striking symmetry would be most unusual for mass. There is no abnormal vascularity in this area. Suspect anatomic variant. No evident orchitis or testicular torsion on either side. 2. 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 Current literature suggests that testicular microlithiasis is not a significant independent risk factor for development of testicular carcinoma, and that follow up imaging is not warranted in the absence of other risk factors. Monthly testicular self-examination and annual physical exams are considered appropriate surveillance. If patient has other risk factors for testicular carcinoma, then referral to Urology should be considered. (Reference: DeCastro, et al.: A 5-Year Follow up Study of Asymptomatic Men with Testicular Microlithiasis. J Urol 2008; [id number].) 3. Small scrotolith immediately adjacent to the right testis, a benign finding. 4. Small hydrocele on each side with mild debris in each hydrocele. This debris could be a result of recent surgery in the nearby prostatic region. Residua of hemorrhage or infection could present in this manner. There is no evidence of abscess. 5.  No demonstrable epididymitis or epididymal mass on either side. Electronically Signed   By: Lowella Grip III M.D.   On: 01/12/2020 09:38    Time Spent in minutes 35   Paz Fuentes M.D on 01/12/2020 at 12:05 PM  Between 7am to 7pm - Pager - [phone]  After 7pm go to [email or web address] - password [email or web address]  [facility] -  Office  [phone]

## 2020-01-12 NOTE — Progress Notes (Signed)
Pharmacy Antibiotic Note  Joseph [name] is a 49 y.o. male admitted on 01/11/2020 with UTI.  Pharmacy has been consulted for Cefepime dosing.  Plan: Cefepime 2gm IV q8hrs  Height: 6\' 1"  (185.4 cm) Weight: 93.9 kg (207 lb) IBW/kg (Calculated) : 79.9  Temp (24hrs), Avg:100.6 F (38.1 C), Min:100.6 F (38.1 C), Max:100.6 F (38.1 C)  Recent Labs  Lab 01/11/20 2253  WBC 16.1*  CREATININE 0.91  LATICACIDVEN 1.1    Estimated Creatinine Clearance: 111 mL/min (by C-G formula based on SCr of 0.91 mg/dL).    Allergies  Allergen Reactions  . Vancomycin Shortness Of Breath  . Lisinopril Rash    Antimicrobials this admission:   >>    >>   Dose adjustments this admission:   Microbiology results:  BCx:   UCx:    Sputum:    MRSA PCR:   Thank you for allowing pharmacy to be a part of this patient's care.  Hart Robinsons A 01/12/2020 4:09 AM

## 2020-01-12 NOTE — H&P (Signed)
To the  History and Physical    Joseph [name] [id number] DOB: Sep 22, 1971 DOA: 01/11/2020  PCP: [facility]   Patient coming from: home  I have personally briefly reviewed patient's old medical records in [place]  Chief Complaint: fever, low abdominal pain  HPI: Joseph [name] is a 49 y.o. male with medical history significant for chronic systolic heart failure, hypertension, insulin-dependent diabetes, coronary artery disease with history of stent angioplasty, with history of prostate cancer, status post robotic laparoscopic prostatectomy on 01/03/2020, with Foley removal 1 day prior who presents to the emergency room with a several hour complaint of generalized body aches, fever of 102.  He denied cough, shortness of breath and denied nausea vomiting or change in bowel habits .  ED Course: In the ER he had a temperature of 100.6, BP 119/71, pulse 94, O2 sat 96% on room air.  WBC was 16,000 and lactic acid 1.1, urinalysis showed large leukocyte esterase.  Flu and Covid test were negative.  CT abdomen and pelvis wall thickening which may be reactive recommending correlation with urinalysis but it was more concerning for an air-fluid collection in the left hemipelvis which may represent a developing abscess in the appropriate clinical setting.  Patient was started on ceftriaxone and Flagyl.  Hospitalist consulted for admission Review of Systems: As per HPI otherwise 10 point review of systems negative.    Past Medical History:  Diagnosis Date  . AICD (automatic cardioverter/defibrillator) present 2011  . Asthma   . Cancer [facility])    prostate  . CHF (congestive heart failure) ([place])   . Coronary artery disease   . Diabetes mellitus without complication ([place])   . Dyspnea   . ED (erectile dysfunction)   . GERD (gastroesophageal reflux disease)   . Hyperlipidemia   . Hypertension   . Ischemic cardiomyopathy   . LADA (latent autoimmune diabetes in adults), managed as  type 1 ([place])   . Myocardial infarction [facility]) 2011   anterior MI at [place] s/p BMS to ostial/proximal LAD  . Neuromuscular disorder ([place])    problems in arms. finger tips have been numb/tingling. voltaren works  . Obstructive sleep apnea 2018   cpap. has not used lately. uses a breathe-right strip on nose  . Seizures ([place])    diabetes seizures (low blood sugar); last one 08/2018  . Systolic heart failure ([place])   . Vitamin D deficiency     Past Surgical History:  Procedure Laterality Date  . CARDIAC DEFIBRILLATOR PLACEMENT  08/30/2010  . CHOLECYSTECTOMY N/A 11/07/2017   Procedure: LAPAROSCOPIC CHOLECYSTECTOMY;  Surgeon: Herbert Pun, MD;  Location: [facility] ORS;  Service: General;  Laterality: N/A;  . CORONARY ANGIOPLASTY  2011   stent placed x 1  . CYSTOSCOPY N/A 01/03/2020   Procedure: CYSTOSCOPY FLEXIBLE;  Surgeon: Hollice Espy, MD;  Location: [facility] ORS;  Service: Urology;  Laterality: N/A;  . ESOPHAGOGASTRODUODENOSCOPY (EGD) WITH PROPOFOL N/A 03/20/2018   Procedure: ESOPHAGOGASTRODUODENOSCOPY (EGD) WITH PROPOFOL;  Surgeon: Manya Silvas, MD;  Location: [facility] ENDOSCOPY;  Service: Endoscopy;  Laterality: N/A;  . LEFT HEART CATH AND CORONARY ANGIOGRAPHY N/A 11/23/2016   Procedure: Left Heart Cath and Coronary Angiography;  Surgeon: Nelva Bush, MD;  Location: [place] CV LAB;  Service: Cardiovascular;  Laterality: N/A;  . PROSTATE BIOPSY  2019  . PROSTATE BIOPSY N/A 10/23/2019   Procedure: PROSTATE BIOPSY;  Surgeon: Abbie Sons, MD;  Location: [facility] ORS;  Service: Urology;  Laterality: N/A;  . ROBOT ASSISTED  LAPAROSCOPIC RADICAL PROSTATECTOMY N/A 01/03/2020   Procedure: XI ROBOTIC ASSISTED LAPAROSCOPIC RADICAL PROSTATECTOMY;  Surgeon: Hollice Espy, MD;  Location: [facility] ORS;  Service: Urology;  Laterality: N/A;  . TRANSRECTAL ULTRASOUND N/A 10/23/2019   Procedure: TRANSRECTAL ULTRASOUND;  Surgeon: Abbie Sons, MD;  Location: [facility] ORS;  Service: Urology;  Laterality:  N/A;  . WRIST SURGERY Right 2008     reports that he has never smoked. He has never used smokeless tobacco. He reports that he does not drink alcohol or use drugs.  Allergies  Allergen Reactions  . Vancomycin Shortness Of Breath  . Lisinopril Rash    Family History  Problem Relation Age of Onset  . Hypertension Mother   . Heart attack Paternal Grandmother   . Heart attack Father   . Bladder Cancer Neg Hx   . Kidney cancer Neg Hx   . Prostate cancer Neg Hx      Prior to Admission medications   Medication Sig Start Date End Date Taking? Authorizing Provider  albuterol (PROVENTIL HFA) 108 (90 Base) MCG/ACT inhaler Inhale 2 puffs into the lungs every 4 (four) hours as needed for wheezing or shortness of breath. 10/08/18   Carrie Mew, MD  aspirin EC 81 MG tablet Take 81 mg by mouth every morning.    [provider]  budesonide (PULMICORT) 1 MG/2ML nebulizer solution Inhale 1 mg into the lungs daily. Pt has not gotten Rx filled as of 12-31-19 11/27/19 11/26/20  [provider]  carvedilol (COREG) 25 MG tablet Take 1 tablet (25 mg total) by mouth 2 (two) times daily. 12/01/19   Carrie Mew, MD  Cholecalciferol (D3-1000) 1000 units capsule Take 1,000 Units by mouth daily.    [provider]  Continuous Blood Gluc Receiver (FREESTYLE LIBRE 14 DAY READER) DEVI Use 1 Device continuously as needed 02/07/18   [provider]  Continuous Blood Gluc Sensor (FREESTYLE LIBRE 14 DAY SENSOR) MISC Use 1 Device every 14 (fourteen) days 02/07/18   [provider]  digoxin (LANOXIN) 0.125 MG tablet Take 1 tablet (125 mcg total) by mouth daily. Patient taking differently: Take 125 mcg by mouth every morning.  12/01/19   Carrie Mew, MD  docusate sodium (COLACE) 100 MG capsule Take 1 capsule (100 mg total) by mouth 2 (two) times daily. 01/04/20   Vaillancourt, Samantha, PA-C  fexofenadine-pseudoephedrine (ALLEGRA-D) 60-120 MG 12 hr tablet Take 1  tablet by mouth 2 (two) times daily. Patient taking differently: Take 1 tablet by mouth as needed.  09/12/19   Sable Feil, PA-C  fluticasone (FLONASE) 50 MCG/ACT nasal spray Place 2 sprays into both nostrils daily as needed for allergies.  01/26/16   [provider]  furosemide (LASIX) 20 MG tablet Take 1 tablet (20 mg total) by mouth 2 (two) times daily. 12/01/19   Carrie Mew, MD  glucagon 1 MG injection Inject into the muscle. 02/07/18   [provider]  glucagon, human recombinant, (GLUCAGEN DIAGNOSTIC) 1 MG injection Inject into the muscle. 02/07/18   [provider]  HUMALOG KWIKPEN 100 UNIT/ML KwikPen Inject 0.18-0.22 mLs (18-22 Units total) into the skin See admin instructions. Inject 18 units subcutaneous in the morning, inject 20 units subcutaneous at lunchtime, and inject 22 units subcutaneous at night. Plus sliding scale as directed. Patient taking differently: Inject 18-22 Units into the skin See admin instructions. Inject 18 units subcutaneous in the morning, inject 20 units subcutaneous at lunchtime, and inject 22 units subcutaneous before dinner. Plus sliding scale as directed.  12/01/19   Carrie Mew, MD  Insulin Glargine, 2 Unit Dial, (TOUJEO MAX SOLOSTAR) 300 UNIT/ML SOPN Inject 44 Units into the skin daily. Patient taking differently: Inject 44 Units into the skin every morning.  12/01/19   Carrie Mew, MD  Insulin Pen Needle (B-D ULTRAFINE III SHORT PEN) 31G X 8 MM MISC USE 5 TIMES DAILY AS  DIRECTED 12/01/19   Carrie Mew, MD  loratadine (CLARITIN) 10 MG tablet Take 10 mg by mouth daily as needed.     [provider]  losartan (COZAAR) 100 MG tablet Take 1 tablet (100 mg total) by mouth at bedtime. 12/01/19   Carrie Mew, MD  montelukast (SINGULAIR) 10 MG tablet Take by mouth. 11/27/19 11/26/20  [provider]  oxybutynin (DITROPAN) 5 MG tablet Take 1 tablet (5 mg total) by mouth every 8 (eight) hours as needed  for bladder spasms. 01/04/20   Vaillancourt, Aldona Bar, PA-C  oxyCODONE-acetaminophen (PERCOCET/ROXICET) 5-325 MG tablet Take 1-2 tablets by mouth every 6 (six) hours as needed for moderate pain. 01/04/20   Vaillancourt, Aldona Bar, PA-C  pantoprazole (PROTONIX) 40 MG tablet Take 1 tablet (40 mg total) by mouth daily. Patient taking differently: Take 40 mg by mouth as needed.  04/21/16   [name], MD  spironolactone (ALDACTONE) 25 MG tablet Take 0.5 tablets (12.5 mg total) by mouth daily. 12/01/19   Carrie Mew, MD    Physical Exam: Vitals:   01/12/20 0030 01/12/20 0100 01/12/20 0130 01/12/20 0200  BP: 137/77 133/74 135/73 128/75  Pulse: 100  96 95  Resp: (!) 21 16 20 10   Temp:      SpO2: 94%  92% 95%  Weight:      Height:         Vitals:   01/12/20 0030 01/12/20 0100 01/12/20 0130 01/12/20 0200  BP: 137/77 133/74 135/73 128/75  Pulse: 100  96 95  Resp: (!) 21 16 20 10   Temp:      SpO2: 94%  92% 95%  Weight:      Height:        Constitutional: Alert and awake, oriented x3, not in any acute distress. Eyes: PERLA, EOMI, irises appear normal, anicteric sclera,  ENMT: external ears and nose appear normal, normal hearing             Lips appears normal, oropharynx mucosa, tongue, posterior pharynx appear normal  Neck: neck appears normal, no masses, normal ROM, no thyromegaly, no JVD  CVS: S1-S2 clear, no murmur rubs or gallops,  , no carotid bruits, pedal pulses palpable, No LE edema Respiratory:  clear to auscultation bilaterally, no wheezing, rales or rhonchi. Respiratory effort normal. No accessory muscle use.  Abdomen: tender in suprapubic area, nondistended, normal bowel sounds, no hepatosplenomegaly, no hernias Musculoskeletal: : no cyanosis, clubbing , no contractures or atrophy Neuro: Cranial nerves II-XII intact, sensation, reflexes normal, strength Psych: judgement and insight appear normal, stable mood and affect,  Skin: no rashes or lesions or ulcers, no  induration or nodules   Labs on Admission: I have personally reviewed following labs and imaging studies  CBC: Recent Labs  Lab 01/11/20 2253  WBC 16.1*  NEUTROABS 12.3*  HGB 13.7  HCT 39.5  MCV 87.8  PLT 123XX123   Basic Metabolic Panel: Recent Labs  Lab 01/11/20 2253  NA 134*  K 4.1  CL 102  CO2 25  GLUCOSE 337*  BUN 16  CREATININE 0.91  CALCIUM 8.3*   GFR: Estimated Creatinine Clearance: 111 mL/min (by C-G  formula based on SCr of 0.91 mg/dL). Liver Function Tests: Recent Labs  Lab 01/11/20 2253  AST 21  ALT 24  ALKPHOS 78  BILITOT 1.2  PROT 6.7  ALBUMIN 3.2*   No results for input(s): LIPASE, AMYLASE in the last 168 hours. No results for input(s): AMMONIA in the last 168 hours. Coagulation Profile: Recent Labs  Lab 01/11/20 2253  INR 1.2   Cardiac Enzymes: No results for input(s): CKTOTAL, CKMB, CKMBINDEX, TROPONINI in the last 168 hours. BNP (last 3 results) No results for input(s): PROBNP in the last 8760 hours. HbA1C: No results for input(s): HGBA1C in the last 72 hours. CBG: No results for input(s): GLUCAP in the last 168 hours. Lipid Profile: No results for input(s): CHOL, HDL, LDLCALC, TRIG, CHOLHDL, LDLDIRECT in the last 72 hours. Thyroid Function Tests: No results for input(s): TSH, T4TOTAL, FREET4, T3FREE, THYROIDAB in the last 72 hours. Anemia Panel: No results for input(s): VITAMINB12, FOLATE, FERRITIN, TIBC, IRON, RETICCTPCT in the last 72 hours. Urine analysis:    Component Value Date/Time   COLORURINE AMBER (A) 01/12/2020 0010   APPEARANCEUR CLOUDY (A) 01/12/2020 0010   APPEARANCEUR Clear 12/27/2019 1546   LABSPEC 1.039 (H) 01/12/2020 0010   PHURINE 5.0 01/12/2020 0010   GLUCOSEU >=500 (A) 01/12/2020 0010   HGBUR LARGE (A) 01/12/2020 0010   BILIRUBINUR NEGATIVE 01/12/2020 0010   BILIRUBINUR Negative 12/27/2019 1546   KETONESUR 5 (A) 01/12/2020 0010   PROTEINUR 100 (A) 01/12/2020 0010   NITRITE NEGATIVE 01/12/2020 0010    LEUKOCYTESUR LARGE (A) 01/12/2020 0010    Radiological Exams on Admission: DG Chest 2 View  Result Date: 01/11/2020 CLINICAL DATA:  Sepsis. EXAM: CHEST - 2 VIEW COMPARISON:  09/12/2019 FINDINGS: There is a left-sided ICD in place. There are trace bilateral pleural effusions. There is no pneumothorax. No large focal infiltrate. No acute osseous abnormality. There is a mild amount of atelectasis at the lung bases. IMPRESSION: Trace bilateral pleural effusions with mild bibasilar atelectasis. Electronically Signed   By: Constance Holster M.D.   On: 01/11/2020 23:02   CT ABDOMEN PELVIS W CONTRAST  Result Date: 01/12/2020 CLINICAL DATA:  Abdominal pain. Fever postop. Recent prostate surgery. Hematuria. EXAM: CT ABDOMEN AND PELVIS WITH CONTRAST TECHNIQUE: Multidetector CT imaging of the abdomen and pelvis was performed using the standard protocol following bolus administration of intravenous contrast. CONTRAST:  151mL OMNIPAQUE IOHEXOL 300 MG/ML  SOLN COMPARISON:  CT dated December 28, 2019 FINDINGS: Lower chest: There are trace bilateral pleural effusions.The heart size is normal. Hepatobiliary: The liver is normal. Status post cholecystectomy.There is no biliary ductal dilation. Pancreas: Normal contours without ductal dilatation. No peripancreatic fluid collection. Spleen: No splenic laceration or hematoma. Adrenals/Urinary Tract: --Adrenal glands: No adrenal hemorrhage. --Right kidney/ureter: No hydronephrosis or perinephric hematoma. --Left kidney/ureter: No hydronephrosis or perinephric hematoma. --Urinary bladder: There is mild diffuse bladder wall thickening. There is air within the urinary bladder. Stomach/Bowel: --Stomach/Duodenum: No hiatal hernia or other gastric abnormality. Normal duodenal course and caliber. --Small bowel: No dilatation or inflammation. --Colon: No focal abnormality. --Appendix: Normal. Vascular/Lymphatic: Normal course and caliber of the major abdominal vessels. --there are few  mildly prominent retroperitoneal lymph nodes that are non pathologically enlarged. --No mesenteric lymphadenopathy. --No pelvic or inguinal lymphadenopathy. Reproductive: The patient is now status post prostatectomy. In the left hemipelvis there is an air and fluid collection measuring approximately 4.3 x 2.3 by 4.7 cm (axial series 2, image 74). This may represent a developing abscess in the appropriate clinical setting.  There are additional pockets of gas in the low anterior pelvis that are presumably postsurgical in etiology. There are inflammatory changes in the patient's pelvis presumably reactive. There is a low-attenuation fluid collection along the patient's left pelvic sidewall measuring approximately 5.5 x 2.6 cm. This is new since the patient's recent prior CT and may represent a postoperative seroma, hematoma, or developing lymphocele. Other: There is some mild fat stranding the patient's anterior abdominal wall, specifically about the umbilicus. There is a small associated pockets of gas at this level. These findings are likely related to the patient's recent surgical intervention. Musculoskeletal. No acute displaced fractures. IMPRESSION: 1. There is an air and fluid collection in the left hemipelvis measuring approximately 4.3 x 2.3 by 4.7 cm. This may represent a developing abscess in the appropriate clinical setting. 2. There is a low-attenuation fluid collection along the left pelvic sidewall measuring approximately 5.5 x 2.6 cm. This is new since the patient's recent prior CT and may represent a postoperative seroma, hematoma, or developing lymphocele. 3. Postop changes are noted related to the patient's interval prostatectomy. 4. Mild bladder wall thickening which may be reactive. However correlation with urinalysis is recommended. There is a small volume of air within the urinary bladder likely related to instrumentation. 5. Trace bilateral pleural effusions. Electronically Signed   By:  Constance Holster M.D.   On: 01/12/2020 02:38    EKG: Independently reviewed.   Assessment/Plan Principal Problem:   UTI (urinary tract infection)   S/P prostatectomy   Possible Postprocedural intraabdominal abscess on CT -s/p robotic assisted laparoscopic radical prostatectomy on 01/03/20 with bilateral nerve sparing, with bilateral pelvic lymph node dissection with Dr. Erlene Quan for management of Gleason 3+4 prostate cancer.  --Start IV cefepime for treatment of UTI and recent GU instrumentation -Close monitoring for development of sepsis.  Not yet meeting criteria. -Evidence of urinary retention.  Bladder scan 48 -IV fluids with monitoring for fluid overload in view of history of systolic heart failure -Pain medication, antiemetics, antipyretics -Consider urology or interventional consult for further assessment of a fluid collection seen on CT.    Hyperglycemia due to type 2 diabetes mellitus (HCC) -Sliding scale coverage    Chronic systolic CHF (congestive heart failure) (HCC) -Not acutely exacerbated but close monitoring in view IV hydration -Continue losartan, spironolactone, Lasix, digoxin and carvedilol pending med rec    CAD (coronary artery disease) -Continue aspirin and statin -No complaints of chest pain    Severe asthma without complication -DuoNebs as needed    DVT prophylaxis: Lovenox  Code Status: full code  Family Communication:  none  Disposition Plan: Back to previous home environment Consults called: urology, Dr Jeffie Pollock Status:obs    Athena Masse MD [facility]     01/12/2020, 4:06 AM

## 2020-01-12 NOTE — ED Notes (Signed)
RN called to speak with accepting RN on 1A. RN spoke to RN DeeDee and was informed that they were working on assignments and that RN DeeDee would pass along my contact number to receiving RN and that receiving RN will contact me, Ali Lowe RN in ED.

## 2020-01-12 NOTE — Progress Notes (Signed)
Spoke with Dr. Jeffie Pollock (urology) regarding CT findings of left hemipelvis collection (lymphocele versus abscess).  He recommended IR consult for aspiration/drainage. I spoke with IR Dr. Alfredia Ferguson on the phone.  He reviewed the imaging and recommends that the collection is very low in the pelvis and difficult to reach.  Also given that patient has surgery fairly recently this could well be a lymphocele and not truly an abscess.  Recommends monitoring on empiric IV antibiotic for now and repeat CT in 72 hours or earlier if symptoms worsen.  Dr.Wrenn will see patient this afternoon.

## 2020-01-12 NOTE — Plan of Care (Signed)

## 2020-01-13 DIAGNOSIS — E1165 Type 2 diabetes mellitus with hyperglycemia: Secondary | ICD-10-CM

## 2020-01-13 DIAGNOSIS — Z794 Long term (current) use of insulin: Secondary | ICD-10-CM

## 2020-01-13 DIAGNOSIS — I5022 Chronic systolic (congestive) heart failure: Secondary | ICD-10-CM

## 2020-01-13 DIAGNOSIS — A419 Sepsis, unspecified organism: Secondary | ICD-10-CM

## 2020-01-13 DIAGNOSIS — T83511D Infection and inflammatory reaction due to indwelling urethral catheter, subsequent encounter: Secondary | ICD-10-CM

## 2020-01-13 LAB — URINE CULTURE: Culture: >=100000 — AB

## 2020-01-13 LAB — BASIC METABOLIC PANEL
Anion gap: 4 — ABNORMAL LOW (ref 5–15)
BUN: 12 mg/dL (ref 6–20)
CO2: 27 mmol/L (ref 22–32)
Calcium: 8.2 mg/dL — ABNORMAL LOW (ref 8.9–10.3)
Chloride: 107 mmol/L (ref 98–111)
Creatinine, Ser: 0.96 mg/dL (ref 0.61–1.24)
GFR calc Af Amer: >60 mL/min (ref >60)
GFR calc non Af Amer: >60 mL/min (ref >60)
Glucose, Bld: 214 mg/dL — ABNORMAL HIGH (ref 70–99)
Potassium: 4.5 mmol/L (ref 3.5–5.1)
Sodium: 138 mmol/L (ref 135–145)

## 2020-01-13 LAB — GLUCOSE, CAPILLARY
Glucose-Capillary: 318 mg/dL — ABNORMAL HIGH (ref 70–99)
Glucose-Capillary: 258 mg/dL — ABNORMAL HIGH (ref 70–99)
Glucose-Capillary: 273 mg/dL — ABNORMAL HIGH (ref 70–99)
Glucose-Capillary: 136 mg/dL — ABNORMAL HIGH (ref 70–99)

## 2020-01-13 LAB — CBC
HCT: 37.6 % — ABNORMAL LOW (ref 39.0–52.0)
Hemoglobin: 12.3 g/dL — ABNORMAL LOW (ref 13.0–17.0)
MCH: 30.4 pg (ref 26.0–34.0)
MCHC: 32.7 g/dL (ref 30.0–36.0)
MCV: 92.8 fL (ref 80.0–100.0)
Platelets: 172 10*3/uL (ref 150–400)
RBC: 4.05 MIL/uL — ABNORMAL LOW (ref 4.22–5.81)
RDW: 12.1 % (ref 11.5–15.5)
WBC: 16.3 10*3/uL — ABNORMAL HIGH (ref 4.0–10.5)
nRBC: 0 % (ref 0.0–0.2)

## 2020-01-13 MED ORDER — ENSURE MAX PROTEIN PO LIQD
11.0000 [oz_av] | Freq: Two times a day (BID) | ORAL | Status: DC
  Administered 2020-01-13 (×2): 11 [oz_av] via ORAL
  Filled 2020-01-13: qty 330

## 2020-01-13 MED ORDER — HYDROCODONE-ACETAMINOPHEN 5-325 MG PO TABS
1.0000 | ORAL_TABLET | Freq: Four times a day (QID) | ORAL | Status: DC | PRN
  Administered 2020-01-14 – 2020-01-15 (×2): 1 via ORAL
  Filled 2020-01-13 (×2): qty 1

## 2020-01-13 MED ORDER — ADULT MULTIVITAMIN W/MINERALS CH
1.0000 | ORAL_TABLET | Freq: Every day | ORAL | Status: DC
  Administered 2020-01-14 – 2020-01-15 (×2): 1 via ORAL
  Filled 2020-01-13 (×2): qty 1

## 2020-01-13 MED ORDER — ALUM & MAG HYDROXIDE-SIMETH 200-200-20 MG/5ML PO SUSP
15.0000 mL | Freq: Four times a day (QID) | ORAL | Status: DC | PRN
  Administered 2020-01-13: 12:00:00 15 mL via ORAL
  Filled 2020-01-13: qty 30

## 2020-01-13 MED ORDER — INSULIN ASPART 100 UNIT/ML ~~LOC~~ SOLN
25.0000 [IU] | Freq: Three times a day (TID) | SUBCUTANEOUS | Status: DC
  Administered 2020-01-13 – 2020-01-14 (×3): 25 [IU] via SUBCUTANEOUS
  Filled 2020-01-13 (×3): qty 1

## 2020-01-13 NOTE — Progress Notes (Signed)
Subjective: Joseph [name] is feeling better today with a declining fever curve and less lower abdominal discomfort.  He had some bloating overnight but that was relieved by a BM.  He still has a leukocytosis.    ROS:  Review of Systems  All other systems reviewed and are negative.   Anti-infectives: Anti-infectives (From admission, onward)   Start     Dose/Rate Route Frequency Ordered Stop   01/12/20 0600  ceFEPIme (MAXIPIME) 2 g in sodium chloride 0.9 % 100 mL IVPB     2 g 200 mL/hr over 30 Minutes Intravenous Every 8 hours 01/12/20 0408     01/12/20 0500  cefTRIAXone (ROCEPHIN) 1 g in sodium chloride 0.9 % 100 mL IVPB  Status:  Discontinued     1 g 200 mL/hr over 30 Minutes Intravenous  Once 01/12/20 0338 01/12/20 0407   01/12/20 0345  metroNIDAZOLE (FLAGYL) IVPB 500 mg     500 mg 100 mL/hr over 60 Minutes Intravenous  Once 01/12/20 0338 01/12/20 0739   01/12/20 0045  cefTRIAXone (ROCEPHIN) 1 g in sodium chloride 0.9 % 100 mL IVPB     1 g 200 mL/hr over 30 Minutes Intravenous  Once 01/12/20 0037 01/12/20 0147      Current Facility-Administered Medications  Medication Dose Route Frequency Provider Last Rate Last Admin  . 0.9 %  sodium chloride infusion   Intravenous Continuous Athena Masse, MD 50 mL/hr at 01/13/20 0500 Rate Verify at 01/13/20 0500  . acetaminophen (TYLENOL) tablet 650 mg  650 mg Oral Q6H PRN Athena Masse, MD   650 mg at 01/12/20 2323   Or  . acetaminophen (TYLENOL) suppository 650 mg  650 mg Rectal Q6H PRN Athena Masse, MD      . albuterol (PROVENTIL) (2.5 MG/3ML) 0.083% nebulizer solution 2.5 mg  2.5 mg Nebulization Q6H PRN Dhungel, Nishant, MD      . budesonide (PULMICORT) nebulizer solution 1 mg  1 mg Nebulization BID Dhungel, Nishant, MD   1 mg at 01/13/20 0728  . carvedilol (COREG) tablet 25 mg  25 mg Oral BID Dhungel, Nishant, MD   25 mg at 01/13/20 0916  . ceFEPIme (MAXIPIME) 2 g in sodium chloride 0.9 % 100 mL IVPB  2 g Intravenous Q8H Hall, Scott A,  RPH 200 mL/hr at 01/13/20 [id number] 2 g at 01/13/20 [id number]  . Chlorhexidine Gluconate Cloth 2 % PADS 6 each  6 each Topical Daily Dhungel, Nishant, MD   6 each at 01/13/20 0920  . digoxin (LANOXIN) tablet 125 mcg  125 mcg Oral Daily Dhungel, Nishant, MD   125 mcg at 01/13/20 0915  . docusate sodium (COLACE) capsule 100 mg  100 mg Oral BID Dhungel, Nishant, MD   100 mg at 01/13/20 0916  . enoxaparin (LOVENOX) injection 40 mg  40 mg Subcutaneous Q24H Judd Gaudier V, MD   40 mg at 01/13/20 0917  . furosemide (LASIX) tablet 20 mg  20 mg Oral BID Dhungel, Nishant, MD   20 mg at 01/13/20 0916  . HYDROcodone-acetaminophen (NORCO/VICODIN) 5-325 MG per tablet 1-2 tablet  1-2 tablet Oral Q4H PRN Athena Masse, MD   1 tablet at 01/12/20 1044  . insulin aspart (novoLOG) injection 0-15 Units  0-15 Units Subcutaneous TID WC Athena Masse, MD   11 Units at 01/13/20 0920  . insulin aspart (novoLOG) injection 0-5 Units  0-5 Units Subcutaneous QHS Judd Gaudier V, MD      . insulin aspart (novoLOG) injection 15 Units  15 Units Subcutaneous TID WC Dhungel, Nishant, MD   15 Units at 01/13/20 0919  . insulin glargine (LANTUS) injection 44 Units  44 Units Subcutaneous Daily Dhungel, Nishant, MD   44 Units at 01/13/20 0920  . losartan (COZAAR) tablet 100 mg  100 mg Oral QHS Dhungel, Nishant, MD   100 mg at 01/12/20 2241  . montelukast (SINGULAIR) tablet 10 mg  10 mg Oral QHS Dhungel, Nishant, MD   10 mg at 01/12/20 2241  . morphine 2 MG/ML injection 2 mg  2 mg Intravenous Q2H PRN Athena Masse, MD   2 mg at 01/12/20 0807  . ondansetron (ZOFRAN) tablet 4 mg  4 mg Oral Q6H PRN Athena Masse, MD       Or  . ondansetron [facility]) injection 4 mg  4 mg Intravenous Q6H PRN Athena Masse, MD      . oxybutynin (DITROPAN) tablet 5 mg  5 mg Oral Q8H PRN Dhungel, Nishant, MD      . pantoprazole (PROTONIX) EC tablet 40 mg  40 mg Oral Daily Dhungel, Nishant, MD   40 mg at 01/13/20 0918  . senna-docusate (Senokot-S) tablet 1 tablet   1 tablet Oral QHS PRN Athena Masse, MD      . spironolactone (ALDACTONE) tablet 12.5 mg  12.5 mg Oral Daily Dhungel, Nishant, MD   12.5 mg at 01/13/20 0915     Objective: Vital signs in last 24 hours: Temp:  [98.3 F (36.8 C)-101.1 F (38.4 C)] 98.3 F (36.8 C) (04/04 0902) Pulse Rate:  [95-106] 98 (04/04 0902) Resp:  [15-22] 15 (04/03 2306) BP: (130-148)/(69-90) 130/70 (04/04 0902) SpO2:  [96 %-99 %] 97 % (04/04 0902)  Intake/Output from previous day: 04/03 0701 - 04/04 0700 In: 917.2 [I.V.:517.2; IV Piggyback:400] Out: 600 [Urine:600] Intake/Output this shift: No intake/output data recorded.   Physical Exam Vitals reviewed.  Constitutional:      Appearance: Normal appearance.  Abdominal:     Comments: Soft, rounded with no suprapubic tenderness.   Neurological:     Mental Status: He is alert.     Lab Results:  Recent Labs    01/12/20 0515 01/13/20 0535  WBC 15.9* 16.3*  HGB 13.0 12.3*  HCT 39.1 37.6*  PLT 154 172   BMET Recent Labs    01/11/20 2253 01/11/20 2253 01/12/20 0515 01/13/20 0535  NA 134*  --   --  138  K 4.1  --   --  4.5  CL 102  --   --  107  CO2 25  --   --  27  GLUCOSE 337*  --   --  214*  BUN 16  --   --  12  CREATININE 0.91   < > 0.85 0.96  CALCIUM 8.3*  --   --  8.2*   < > = values in this interval not displayed.   PT/INR Recent Labs    01/11/20 2253  LABPROT 14.9  INR 1.2   ABG No results for input(s): PHART, HCO3 in the last 72 hours.  Invalid input(s): PCO2, PO2  Studies/Results: DG Chest 2 View  Result Date: 01/11/2020 CLINICAL DATA:  Sepsis. EXAM: CHEST - 2 VIEW COMPARISON:  09/12/2019 FINDINGS: There is a left-sided ICD in place. There are trace bilateral pleural effusions. There is no pneumothorax. No large focal infiltrate. No acute osseous abnormality. There is a mild amount of atelectasis at the lung bases. IMPRESSION: Trace bilateral pleural effusions with mild bibasilar atelectasis. Electronically  Signed    By: Constance Holster M.D.   On: 01/11/2020 23:02   CT ABDOMEN PELVIS W CONTRAST  Result Date: 01/12/2020 CLINICAL DATA:  Abdominal pain. Fever postop. Recent prostate surgery. Hematuria. EXAM: CT ABDOMEN AND PELVIS WITH CONTRAST TECHNIQUE: Multidetector CT imaging of the abdomen and pelvis was performed using the standard protocol following bolus administration of intravenous contrast. CONTRAST:  196mL OMNIPAQUE IOHEXOL 300 MG/ML  SOLN COMPARISON:  CT dated December 28, 2019 FINDINGS: Lower chest: There are trace bilateral pleural effusions.The heart size is normal. Hepatobiliary: The liver is normal. Status post cholecystectomy.There is no biliary ductal dilation. Pancreas: Normal contours without ductal dilatation. No peripancreatic fluid collection. Spleen: No splenic laceration or hematoma. Adrenals/Urinary Tract: --Adrenal glands: No adrenal hemorrhage. --Right kidney/ureter: No hydronephrosis or perinephric hematoma. --Left kidney/ureter: No hydronephrosis or perinephric hematoma. --Urinary bladder: There is mild diffuse bladder wall thickening. There is air within the urinary bladder. Stomach/Bowel: --Stomach/Duodenum: No hiatal hernia or other gastric abnormality. Normal duodenal course and caliber. --Small bowel: No dilatation or inflammation. --Colon: No focal abnormality. --Appendix: Normal. Vascular/Lymphatic: Normal course and caliber of the major abdominal vessels. --there are few mildly prominent retroperitoneal lymph nodes that are non pathologically enlarged. --No mesenteric lymphadenopathy. --No pelvic or inguinal lymphadenopathy. Reproductive: The patient is now status post prostatectomy. In the left hemipelvis there is an air and fluid collection measuring approximately 4.3 x 2.3 by 4.7 cm (axial series 2, image 74). This may represent a developing abscess in the appropriate clinical setting. There are additional pockets of gas in the low anterior pelvis that are presumably postsurgical in  etiology. There are inflammatory changes in the patient's pelvis presumably reactive. There is a low-attenuation fluid collection along the patient's left pelvic sidewall measuring approximately 5.5 x 2.6 cm. This is new since the patient's recent prior CT and may represent a postoperative seroma, hematoma, or developing lymphocele. Other: There is some mild fat stranding the patient's anterior abdominal wall, specifically about the umbilicus. There is a small associated pockets of gas at this level. These findings are likely related to the patient's recent surgical intervention. Musculoskeletal. No acute displaced fractures. IMPRESSION: 1. There is an air and fluid collection in the left hemipelvis measuring approximately 4.3 x 2.3 by 4.7 cm. This may represent a developing abscess in the appropriate clinical setting. 2. There is a low-attenuation fluid collection along the left pelvic sidewall measuring approximately 5.5 x 2.6 cm. This is new since the patient's recent prior CT and may represent a postoperative seroma, hematoma, or developing lymphocele. 3. Postop changes are noted related to the patient's interval prostatectomy. 4. Mild bladder wall thickening which may be reactive. However correlation with urinalysis is recommended. There is a small volume of air within the urinary bladder likely related to instrumentation. 5. Trace bilateral pleural effusions. Electronically Signed   By: Constance Holster M.D.   On: 01/12/2020 02:38   US SCROTUM W/DOPPLER  Result Date: 01/12/2020 CLINICAL DATA:  Scrotal pain. Recent surgery for prostatic carcinoma EXAM: SCROTAL ULTRASOUND DOPPLER ULTRASOUND OF THE TESTICLES TECHNIQUE: Complete ultrasound examination of the testicles, epididymis, and other scrotal structures was performed. Color and spectral Doppler ultrasound were also utilized to evaluate blood flow to the testicles. COMPARISON:  None. FINDINGS: Right testicle Measurements: 2.9 x 2.7 x 2.6 cm. There are  scattered tiny calcifications in the right testis. A scrotolith measuring 3 mm is seen immediately adjacent to the right testis. There is an area of somewhat wedge-shaped decreased echogenicity in the periphery  of the right testis which appears ill-defined. A well-defined mass is not seen in the right testis. Left testicle Measurements: 4.1 x 2.5 x 2.7 cm. There is slight microlithiasis. There is a somewhat ill-defined area of wedge-shaped decreased echogenicity in the periphery of the left testis which appears essentially identical to the right side. Well-defined mass not seen. Right epididymis: Normal in size and appearance. No evident hyperemia Left epididymis: Normal in size and appearance. No evident hyperemia Hydrocele: Small hydrocele on each side. A small amount of debris is noted in each hydrocele. Varicocele:  None visualized. Pulsed Doppler interrogation of both testes demonstrates normal low resistance arterial and venous waveforms bilaterally. No scrotal wall thickening or scrotal abscess evident. IMPRESSION: 1. Testes appear symmetric bilaterally. There is decreased echogenicity in a somewhat wedge-shaped manner along the periphery of each testis, identical between left and right sides. This striking symmetry would be most unusual for mass. There is no abnormal vascularity in this area. Suspect anatomic variant. No evident orchitis or testicular torsion on either side. 2. 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 Current literature suggests that testicular microlithiasis is not a significant independent risk factor for development of testicular carcinoma, and that follow up imaging is not warranted in the absence of other risk factors. Monthly testicular self-examination and annual physical exams are considered appropriate surveillance. If patient has other risk factors for testicular carcinoma, then referral to Urology should be considered. (Reference: DeCastro, et al.: A 5-Year Follow up Study  of Asymptomatic Men with Testicular Microlithiasis. J Urol 2008; [id number].) 3. Small scrotolith immediately adjacent to the right testis, a benign finding. 4. Small hydrocele on each side with mild debris in each hydrocele. This debris could be a result of recent surgery in the nearby prostatic region. Residua of hemorrhage or infection could present in this manner. There is no evidence of abscess. 5.  No demonstrable epididymitis or epididymal mass on either side. Electronically Signed   By: Lowella Grip III M.D.   On: 01/12/2020 09:38     Assessment and Plan: Febrile UTI with small fluid and air collection in pelvis.  He is improving on antibiotic therapy.   Continue current management.   No need for repeat imaging at this time.    Diabetes.  His control has improved.       LOS: 1 day    Joseph [name] 01/13/2020 [id number] ID: Joseph [name], male   DOB: 1971-03-11, 49 y.o.   MRN: [id number]

## 2020-01-13 NOTE — Progress Notes (Addendum)
PROGRESS NOTE                                                                                                                                                                                                             Patient Demographics:    Joseph [name], is a 49 y.o. male, DOB - 1971/02/02, ZV:[id number]  Admit date - 01/11/2020   Admitting Physician Athena Masse, MD  Outpatient Primary MD for the patient is [facility]  LOS - 1  Outpatient Specialists: Urology (Dr. Erlene Quan)  Chief Complaint  Patient presents with  . Fever  . Post-op Problem       Brief Narrative 49 year old male with chronic systolic CHF, hypertension, insulin-dependent diabetes mellitus, CAD with history of PCI, history of prostate cancer s/p robotic laparoscopic prostatectomy on 3/25 and discharged on Foley that was removed on 4/1 in the urology office presented to the ED with generalized body ache with chills and fever of 102 F.  Noted blood in urine since his Foley has been removed and also dysuria with pain in his penis and scrotum. In the ED patient was febrile with temperature of 100.6 F, normal blood pressure and sats. Blood work showed WBC 16 K and UA suggestive of UTI.  Patient tested negative for COVID-19.  CT abdomen pelvis showed an air and fluid collection in the left hemipelvis measuring 4.3 x 2.3 x 4.7 cm possible for developing abscess.  Also low-attenuation fluid collection along the left pelvic sidewall measuring 5.5 x 2.6 cm which is new and possible seroma versus hematoma versus developing lymphocele.  Also showed mild bladder wall thickening.  Patient started on empiric IV cefepime and admitted for severe sepsis.   Subjective:   Patient denies any pain in his penis or scrotum today.  Reports abdominal bloating.  Has been afebrile since yesterday.   Assessment  & Plan :    Principal Problem: Severe sepsis  without acute target organ damage (HCC) Likely secondary to UTI (catheter associated) with concern for fluid collection/abscess in the left hemipelvis.  Sepsis improving, T-max of 101 F and still has leukocytosis. Blood culture so far negative for growth.  Urine culture growing E. coli sensitive to cephalosporins.  Continue IV cefepime for today and transition to IV Rocephin tomorrow if sepsis resolved.  Supportive care with Tylenol, Vicodin for  pain. Scrotal ultrasound with Doppler done given complaint of penile and scrotal pain shows testicular microlithiasis bilaterally, small hydrocele on each side without any abscess, epididymitis.  Discussed CT findings with IR (Dr. Saunders Revel) who reviewed the images and recommended that the collection was very low in the pelvis and difficult to reach.  Suggested this could well be a lymphocele and not truly an abscess and recommended repeat CT in 72 hours depending upon his symptoms.  Urology consult appreciated.  Given his clinical improvement today and sepsis resolved recommends continuing antibiotic and no further imaging needed.  Dr. Erlene Quan will follow him tomorrow.    Active Problems: Status post recent prostatectomy on 3/25 Followed by urology as outpatient.  Monitor strict I's/O.  Patient voiding without difficulty.  Dysuria has improved.  Chronic systolic CHF Euvolemic.  Off IV fluids.  Continue Lasix, losartan, Aldactone, digoxin and Coreg.  Coronary artery disease Asymptomatic.  Continue aspirin and statin.  Diabetes mellitus type 2, uncontrolled with hyperglycemia on  long-term insulin use A1c of 10.5.  Resume home dose Lantus (44 units daily).  Patient on high-dose Premeal aspart 3 times a day at home.  CBGs in 250-350.  Increase Premeal aspart to 25 units 3 times daily.   History of asthma No further wheezing.  Continue as needed nebs and home inhaler.   Code Status : Full code  Family Communication  : None  Disposition Plan  :  Home possibly the next 48 hours if infection continues to improve and final culture and sensitivity obtained.  Barriers For Discharge : Improving sepsis  Consults  : Urology  Procedures  : CT abdomen pelvis, scrotal Doppler  DVT Prophylaxis  : Subcu Lovenox  Lab Results  Component Value Date   PLT 172 01/13/2020    Antibiotics  :    Anti-infectives (From admission, onward)   Start     Dose/Rate Route Frequency Ordered Stop   01/12/20 0600  ceFEPIme (MAXIPIME) 2 g in sodium chloride 0.9 % 100 mL IVPB     2 g 200 mL/hr over 30 Minutes Intravenous Every 8 hours 01/12/20 0408     01/12/20 0500  cefTRIAXone (ROCEPHIN) 1 g in sodium chloride 0.9 % 100 mL IVPB  Status:  Discontinued     1 g 200 mL/hr over 30 Minutes Intravenous  Once 01/12/20 0338 01/12/20 0407   01/12/20 0345  metroNIDAZOLE (FLAGYL) IVPB 500 mg     500 mg 100 mL/hr over 60 Minutes Intravenous  Once 01/12/20 0338 01/12/20 0739   01/12/20 0045  cefTRIAXone (ROCEPHIN) 1 g in sodium chloride 0.9 % 100 mL IVPB     1 g 200 mL/hr over 30 Minutes Intravenous  Once 01/12/20 0037 01/12/20 0147        Objective:   Vitals:   01/12/20 2023 01/12/20 2232 01/12/20 2306 01/13/20 0902  BP:  (!) 146/90 132/76 130/70  Pulse:  (!) 106 99 98  Resp:  16 15   Temp:  (!) 100.4 F (38 C) 99.8 F (37.7 C) 98.3 F (36.8 C)  TempSrc:  Oral Oral Oral  SpO2: 98% 98% 97% 97%  Weight:      Height:        Wt Readings from Last 3 Encounters:  01/11/20 93.9 kg  01/10/20 93.9 kg  12/04/19 94 kg     Intake/Output Summary (Last 24 hours) at 01/13/2020 1253 Last data filed at 01/13/2020 0648 Gross per 24 hour  Intake 717.15 ml  Output 600 ml  Net  117.15 ml    Physical exam Not in distress HEENT: Moist mucosa, supple neck Chest: Clear bilaterally CVs: Normal S1-S2 GI: Soft, mild abdominal distention, nontender, no penile or scrotal swelling or tenderness, no CVA tenderness Musculoskeletal: Warm, no edema     Data Review:     CBC Recent Labs  Lab 01/11/20 2253 01/12/20 0515 01/13/20 0535  WBC 16.1* 15.9* 16.3*  HGB 13.7 13.0 12.3*  HCT 39.5 39.1 37.6*  PLT 187 154 172  MCV 87.8 91.4 92.8  MCH 30.4 30.4 30.4  MCHC 34.7 33.2 32.7  RDW 12.1 12.2 12.1  LYMPHSABS 1.3  --   --   MONOABS 1.6*  --   --   EOSABS 0.8*  --   --   BASOSABS 0.0  --   --     Chemistries  Recent Labs  Lab 01/11/20 2253 01/12/20 0515 01/13/20 0535  NA 134*  --  138  K 4.1  --  4.5  CL 102  --  107  CO2 25  --  27  GLUCOSE 337*  --  214*  BUN 16  --  12  CREATININE 0.91 0.85 0.96  CALCIUM 8.3*  --  8.2*  AST 21  --   --   ALT 24  --   --   ALKPHOS 78  --   --   BILITOT 1.2  --   --    ------------------------------------------------------------------------------------------------------------------ No results for input(s): CHOL, HDL, LDLCALC, TRIG, CHOLHDL, LDLDIRECT in the last 72 hours.  Lab Results  Component Value Date   HGBA1C 10.5 (H) 01/03/2020   ------------------------------------------------------------------------------------------------------------------ No results for input(s): TSH, T4TOTAL, T3FREE, THYROIDAB in the last 72 hours.  Invalid input(s): FREET3 ------------------------------------------------------------------------------------------------------------------ No results for input(s): VITAMINB12, FOLATE, FERRITIN, TIBC, IRON, RETICCTPCT in the last 72 hours.  Coagulation profile Recent Labs  Lab 01/11/20 2253  INR 1.2    No results for input(s): DDIMER in the last 72 hours.  Cardiac Enzymes No results for input(s): CKMB, TROPONINI, MYOGLOBIN in the last 168 hours.  Invalid input(s): CK ------------------------------------------------------------------------------------------------------------------    Component Value Date/Time   BNP 51.0 02/24/2017 1408    Inpatient Medications  Scheduled Meds: . budesonide  1 mg Nebulization BID  . carvedilol  25 mg Oral BID  .  Chlorhexidine Gluconate Cloth  6 each Topical Daily  . digoxin  125 mcg Oral Daily  . docusate sodium  100 mg Oral BID  . enoxaparin (LOVENOX) injection  40 mg Subcutaneous Q24H  . furosemide  20 mg Oral BID  . insulin aspart  0-15 Units Subcutaneous TID WC  . insulin aspart  0-5 Units Subcutaneous QHS  . insulin aspart  15 Units Subcutaneous TID WC  . insulin glargine  44 Units Subcutaneous Daily  . losartan  100 mg Oral QHS  . montelukast  10 mg Oral QHS  . [START ON 01/14/2020] multivitamin with minerals  1 tablet Oral Daily  . pantoprazole  40 mg Oral Daily  . Ensure Max Protein  11 oz Oral BID  . spironolactone  12.5 mg Oral Daily   Continuous Infusions: . ceFEPime (MAXIPIME) IV 2 g (01/13/20 [id number])   PRN Meds:.acetaminophen **OR** acetaminophen, albuterol, alum & mag hydroxide-simeth, HYDROcodone-acetaminophen, morphine injection, ondansetron **OR** ondansetron (ZOFRAN) IV, oxybutynin, senna-docusate  Micro Results Recent Results (from the past 240 hour(s))  Culture, blood (Routine x 2)     Status: None (Preliminary result)   Collection Time: 01/11/20 10:53 PM   Specimen: BLOOD  Result Value  Ref Range Status   Specimen Description BLOOD LEFT HAND  Final   Special Requests   Final    BOTTLES DRAWN AEROBIC AND ANAEROBIC Blood Culture results may not be optimal due to an excessive volume of blood received in culture bottles   Culture   Final    NO GROWTH 2 DAYS Performed at [facility], [address] [place], [place] [address]    Report Status PENDING  Incomplete  Culture, blood (Routine x 2)     Status: None (Preliminary result)   Collection Time: 01/11/20 11:17 PM   Specimen: BLOOD  Result Value Ref Range Status   Specimen Description BLOOD LEFT WRIST  Final   Special Requests   Final    BOTTLES DRAWN AEROBIC AND ANAEROBIC Blood Culture results may not be optimal due to an inadequate volume of blood received in culture bottles   Culture   Final    NO GROWTH 2  DAYS Performed at [facility], [address], [place], [place] [address]    Report Status PENDING  Incomplete  Urine Culture     Status: Abnormal   Collection Time: 01/12/20 12:10 AM   Specimen: Urine, Random  Result Value Ref Range Status   Specimen Description   Final    URINE, RANDOM Performed at [facility], [address] [place], [place] [address]    Special Requests   Final    NONE Performed at [facility], [address] [place], [place] [address]    Culture >=100,000 COLONIES/mL ESCHERICHIA COLI (A)  Final   Report Status 01/13/2020 FINAL  Final   Organism ID, Bacteria ESCHERICHIA COLI (A)  Final      Susceptibility   Escherichia coli - MIC*    AMPICILLIN >=32 RESISTANT Resistant     CEFAZOLIN 16 SENSITIVE Sensitive     CEFTRIAXONE <=0.25 SENSITIVE Sensitive     CIPROFLOXACIN <=0.25 SENSITIVE Sensitive     GENTAMICIN <=1 SENSITIVE Sensitive     IMIPENEM <=0.25 SENSITIVE Sensitive     NITROFURANTOIN <=16 SENSITIVE Sensitive     TRIMETH/SULFA <=20 SENSITIVE Sensitive     AMPICILLIN/SULBACTAM >=32 RESISTANT Resistant     PIP/TAZO <=4 SENSITIVE Sensitive     * >=100,000 COLONIES/mL ESCHERICHIA COLI  Respiratory Panel by RT PCR (Flu A&B, Covid) - Nasopharyngeal Swab     Status: None   Collection Time: 01/12/20  1:49 AM   Specimen: Nasopharyngeal Swab  Result Value Ref Range Status   SARS Coronavirus 2 by RT PCR NEGATIVE NEGATIVE Final    Comment: (NOTE) SARS-CoV-2 target nucleic acids are NOT DETECTED. The SARS-CoV-2 RNA is generally detectable in upper respiratoy specimens during the acute phase of infection. The lowest concentration of SARS-CoV-2 viral copies this assay can detect is 131 copies/mL. A negative result does not preclude SARS-Cov-2 infection and should not be used as the sole basis for treatment or other patient management decisions. A negative result may occur with  improper specimen collection/handling, submission  of specimen other than nasopharyngeal swab, presence of viral mutation(s) within the areas targeted by this assay, and inadequate number of viral copies (<131 copies/mL). A negative result must be combined with clinical observations, patient history, and epidemiological information. The expected result is Negative. Fact Sheet for Patients:  PinkCheek.be Fact Sheet for Healthcare Providers:  GravelBags.it This test is not yet ap proved or cleared by the [place] FDA and  has been authorized for detection and/or diagnosis of SARS-CoV-2 by FDA under  an Emergency Use Authorization (EUA). This EUA will remain  in effect (meaning this test can be used) for the duration of the COVID-19 declaration under Section 564(b)(1) of the Act, 21 U.S.C. section 360bbb-3(b)(1), unless the authorization is terminated or revoked sooner.    Influenza A by PCR NEGATIVE NEGATIVE Final   Influenza B by PCR NEGATIVE NEGATIVE Final    Comment: (NOTE) The Xpert Xpress SARS-CoV-2/FLU/RSV assay is intended as an aid in  the diagnosis of influenza from Nasopharyngeal swab specimens and  should not be used as a sole basis for treatment. Nasal washings and  aspirates are unacceptable for Xpert Xpress SARS-CoV-2/FLU/RSV  testing. Fact Sheet for Patients: PinkCheek.be Fact Sheet for Healthcare Providers: GravelBags.it This test is not yet approved or cleared by the [place] FDA and  has been authorized for detection and/or diagnosis of SARS-CoV-2 by  FDA under an Emergency Use Authorization (EUA). This EUA will remain  in effect (meaning this test can be used) for the duration of the  Covid-19 declaration under Section 564(b)(1) of the Act, 21  U.S.C. section 360bbb-3(b)(1), unless the authorization is  terminated or revoked. Performed at [facility], [address],  [place], [place] [address]     Radiology Reports DG Chest 2 View  Result Date: 01/11/2020 CLINICAL DATA:  Sepsis. EXAM: CHEST - 2 VIEW COMPARISON:  09/12/2019 FINDINGS: There is a left-sided ICD in place. There are trace bilateral pleural effusions. There is no pneumothorax. No large focal infiltrate. No acute osseous abnormality. There is a mild amount of atelectasis at the lung bases. IMPRESSION: Trace bilateral pleural effusions with mild bibasilar atelectasis. Electronically Signed   By: Constance Holster M.D.   On: 01/11/2020 23:02   CT ABDOMEN PELVIS W CONTRAST  Result Date: 01/12/2020 CLINICAL DATA:  Abdominal pain. Fever postop. Recent prostate surgery. Hematuria. EXAM: CT ABDOMEN AND PELVIS WITH CONTRAST TECHNIQUE: Multidetector CT imaging of the abdomen and pelvis was performed using the standard protocol following bolus administration of intravenous contrast. CONTRAST:  15mL OMNIPAQUE IOHEXOL 300 MG/ML  SOLN COMPARISON:  CT dated December 28, 2019 FINDINGS: Lower chest: There are trace bilateral pleural effusions.The heart size is normal. Hepatobiliary: The liver is normal. Status post cholecystectomy.There is no biliary ductal dilation. Pancreas: Normal contours without ductal dilatation. No peripancreatic fluid collection. Spleen: No splenic laceration or hematoma. Adrenals/Urinary Tract: --Adrenal glands: No adrenal hemorrhage. --Right kidney/ureter: No hydronephrosis or perinephric hematoma. --Left kidney/ureter: No hydronephrosis or perinephric hematoma. --Urinary bladder: There is mild diffuse bladder wall thickening. There is air within the urinary bladder. Stomach/Bowel: --Stomach/Duodenum: No hiatal hernia or other gastric abnormality. Normal duodenal course and caliber. --Small bowel: No dilatation or inflammation. --Colon: No focal abnormality. --Appendix: Normal. Vascular/Lymphatic: Normal course and caliber of the major abdominal vessels. --there are few mildly prominent retroperitoneal  lymph nodes that are non pathologically enlarged. --No mesenteric lymphadenopathy. --No pelvic or inguinal lymphadenopathy. Reproductive: The patient is now status post prostatectomy. In the left hemipelvis there is an air and fluid collection measuring approximately 4.3 x 2.3 by 4.7 cm (axial series 2, image 74). This may represent a developing abscess in the appropriate clinical setting. There are additional pockets of gas in the low anterior pelvis that are presumably postsurgical in etiology. There are inflammatory changes in the patient's pelvis presumably reactive. There is a low-attenuation fluid collection along the patient's left pelvic sidewall measuring approximately 5.5 x 2.6 cm. This is new since the patient's recent prior CT and may represent a postoperative  seroma, hematoma, or developing lymphocele. Other: There is some mild fat stranding the patient's anterior abdominal wall, specifically about the umbilicus. There is a small associated pockets of gas at this level. These findings are likely related to the patient's recent surgical intervention. Musculoskeletal. No acute displaced fractures. IMPRESSION: 1. There is an air and fluid collection in the left hemipelvis measuring approximately 4.3 x 2.3 by 4.7 cm. This may represent a developing abscess in the appropriate clinical setting. 2. There is a low-attenuation fluid collection along the left pelvic sidewall measuring approximately 5.5 x 2.6 cm. This is new since the patient's recent prior CT and may represent a postoperative seroma, hematoma, or developing lymphocele. 3. Postop changes are noted related to the patient's interval prostatectomy. 4. Mild bladder wall thickening which may be reactive. However correlation with urinalysis is recommended. There is a small volume of air within the urinary bladder likely related to instrumentation. 5. Trace bilateral pleural effusions. Electronically Signed   By: Constance Holster M.D.   On: 01/12/2020  02:38   CT HEMATURIA WORKUP  Result Date: 12/28/2019 CLINICAL DATA:  Microscopic hematuria. Stage IIa prostate adenocarcinoma. Mild testicular pain. EXAM: CT ABDOMEN AND PELVIS WITHOUT AND WITH CONTRAST TECHNIQUE: Multidetector CT imaging of the abdomen and pelvis was performed following the standard protocol before and following the bolus administration of intravenous contrast. CONTRAST:  166mL OMNIPAQUE IOHEXOL 300 MG/ML  SOLN COMPARISON:  None. FINDINGS: Lower chest: Defibrillator lead noted projecting in the right ventricle. Hepatobiliary: Cholecystectomy.  Otherwise unremarkable Pancreas: Unremarkable Spleen: Unremarkable Adrenals/Urinary Tract: Suspected 1-2 mm left mid kidney calculus on image 89/6. Suspected 1-2 mm right kidney lower pole nonobstructive calculus on image 33/5. No hydronephrosis, hydroureter, or ureteral calculus. No significant abnormal renal parenchymal enhancement. No appreciable abnormal enhancement or abnormal filling defect along the urothelium. The adrenal glands appear normal. Stomach/Bowel: Borderline dilated loops of proximal jejunum some demonstrating mild wall thickening. Mildly high position of the cecum on image 52/10. Mildly accentuated enhancement along the rectal wall for example on image 66/7, local inflammation is not excluded. Vascular/Lymphatic: Small periaortic lymph nodes are not pathologically enlarged by size criteria. No pathologic adenopathy is noted. Minimal abdominal aortic atherosclerotic calcification. Reproductive: There is some asymmetric enhancement in the peripheral zone of the right prostate gland compared to the left. Other: No supplemental non-categorized findings. Musculoskeletal: 4 mm sclerotic focus in the left iliac bone on image 50/7 is most likely a bone island or similar benign lesion, although strictly speaking is nonspecific. Degenerative facet arthropathy is observed bilaterally at L5-S1. Small lucent lesion in the left iliac bone on image  45/10 is likely benign. IMPRESSION: 1. No findings of adenopathy or compelling evidence of osseous metastatic disease. 2. Questionable punctate single nonobstructive bilateral renal calculi. No other cause for hematuria is observed. 3. Asymmetric enhancement in the right peripheral zone of the prostate gland. 4. Borderline dilated loops of proximal jejunum with some questionable wall thickening. Questionable rectal wall thickening. Query low-grade enterocolitis, correlate with any change in bowel habits. 5. Electronically Signed   By: Van Clines M.D.   On: 12/28/2019 14:58   US SCROTUM W/DOPPLER  Result Date: 01/12/2020 CLINICAL DATA:  Scrotal pain. Recent surgery for prostatic carcinoma EXAM: SCROTAL ULTRASOUND DOPPLER ULTRASOUND OF THE TESTICLES TECHNIQUE: Complete ultrasound examination of the testicles, epididymis, and other scrotal structures was performed. Color and spectral Doppler ultrasound were also utilized to evaluate blood flow to the testicles. COMPARISON:  None. FINDINGS: Right testicle Measurements: 2.9 x 2.7 x 2.6  cm. There are scattered tiny calcifications in the right testis. A scrotolith measuring 3 mm is seen immediately adjacent to the right testis. There is an area of somewhat wedge-shaped decreased echogenicity in the periphery of the right testis which appears ill-defined. A well-defined mass is not seen in the right testis. Left testicle Measurements: 4.1 x 2.5 x 2.7 cm. There is slight microlithiasis. There is a somewhat ill-defined area of wedge-shaped decreased echogenicity in the periphery of the left testis which appears essentially identical to the right side. Well-defined mass not seen. Right epididymis: Normal in size and appearance. No evident hyperemia Left epididymis: Normal in size and appearance. No evident hyperemia Hydrocele: Small hydrocele on each side. A small amount of debris is noted in each hydrocele. Varicocele:  None visualized. Pulsed Doppler interrogation  of both testes demonstrates normal low resistance arterial and venous waveforms bilaterally. No scrotal wall thickening or scrotal abscess evident. IMPRESSION: 1. Testes appear symmetric bilaterally. There is decreased echogenicity in a somewhat wedge-shaped manner along the periphery of each testis, identical between left and right sides. This striking symmetry would be most unusual for mass. There is no abnormal vascularity in this area. Suspect anatomic variant. No evident orchitis or testicular torsion on either side. 2. 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 Current literature suggests that testicular microlithiasis is not a significant independent risk factor for development of testicular carcinoma, and that follow up imaging is not warranted in the absence of other risk factors. Monthly testicular self-examination and annual physical exams are considered appropriate surveillance. If patient has other risk factors for testicular carcinoma, then referral to Urology should be considered. (Reference: DeCastro, et al.: A 5-Year Follow up Study of Asymptomatic Men with Testicular Microlithiasis. J Urol 2008; [id number].) 3. Small scrotolith immediately adjacent to the right testis, a benign finding. 4. Small hydrocele on each side with mild debris in each hydrocele. This debris could be a result of recent surgery in the nearby prostatic region. Residua of hemorrhage or infection could present in this manner. There is no evidence of abscess. 5.  No demonstrable epididymitis or epididymal mass on either side. Electronically Signed   By: Lowella Grip III M.D.   On: 01/12/2020 09:38    Time Spent in minutes 35   Kimberlin Scheel M.D on 01/13/2020 at 12:53 PM  Between 7am to 7pm - Pager - [phone]  After 7pm go to [email or web address] - password [facility]  [facility] -  Office  [phone]

## 2020-01-13 NOTE — Progress Notes (Signed)
Initial Nutrition Assessment  DOCUMENTATION CODES:   Not applicable  INTERVENTION:   Ensure Max protein supplement BID, each supplement provides 150kcal and 30g of protein.  MVI daily   NUTRITION DIAGNOSIS:   Increased nutrient needs related to post-op healing as evidenced by increased estimated needs.  GOAL:   Patient will meet greater than or equal to 90% of their needs  MONITOR:   PO intake, Supplement acceptance, Labs, Weight trends, Skin, I & O's  REASON FOR ASSESSMENT:   Malnutrition Screening Tool    ASSESSMENT:   49 y.o. male with medical history significant for chronic systolic heart failure, hypertension, insulin-dependent diabetes, coronary artery disease with history of stent angioplasty, with history of prostate cancer, status post robotic laparoscopic prostatectomy on 01/03/2020, with Foley removal 1 day prior who presents with UTI   Pt with fair appetite and oral intake in hospital. RD will add supplements and MVI to help pt meet his estimated needs. RD will liberalize the heart healthy portion of pt's diet as this is restrictive of protein. Per chart, pt down 11lbs(5%) over the past 3 months; this is not significant.   Medications reviewed and include: colace, lovenox, lasix, insulin, protonix, aldactone, NaCl @50ml /hr, cefepime  Labs reviewed: wbc 16.3(H) cbgs- 223, 146, 263, 116, 318 x 24 hrs AIC 10.5(H) 3/25  NUTRITION - FOCUSED PHYSICAL EXAM: Unable to perform at this time  Diet Order:   Diet Order            Diet heart healthy/carb modified Room service appropriate? Yes; Fluid consistency: Thin  Diet effective now             EDUCATION NEEDS:   No education needs have been identified at this time  Skin:  Skin Assessment: Reviewed RN Assessment(closed incision abdomen)  Last BM:  PTA  Height:   Ht Readings from Last 1 Encounters:  01/11/20 6\' 1"  (1.854 m)    Weight:   Wt Readings from Last 1 Encounters:  01/11/20 93.9 kg     Ideal Body Weight:  83.6 kg  BMI:  Body mass index is 27.31 kg/m.  Estimated Nutritional Needs:   Kcal:  2200-2500kcal/day  Protein:  110-125g/day  Fluid:  >2.5L/day  Koleen Distance MS, RD, LDN Please refer to [facility] for RD and/or RD on-call/weekend/after hours pager

## 2020-01-14 DIAGNOSIS — T8143XA Infection following a procedure, organ and space surgical site, initial encounter: Secondary | ICD-10-CM

## 2020-01-14 DIAGNOSIS — N5082 Scrotal pain: Secondary | ICD-10-CM

## 2020-01-14 DIAGNOSIS — N3090 Cystitis, unspecified without hematuria: Secondary | ICD-10-CM

## 2020-01-14 LAB — BASIC METABOLIC PANEL
Anion gap: 6 (ref 5–15)
BUN: 21 mg/dL — ABNORMAL HIGH (ref 6–20)
CO2: 23 mmol/L (ref 22–32)
Calcium: 8.1 mg/dL — ABNORMAL LOW (ref 8.9–10.3)
Chloride: 107 mmol/L (ref 98–111)
Creatinine, Ser: 0.95 mg/dL (ref 0.61–1.24)
GFR calc Af Amer: >60 mL/min (ref >60)
GFR calc non Af Amer: >60 mL/min (ref >60)
Glucose, Bld: 317 mg/dL — ABNORMAL HIGH (ref 70–99)
Potassium: 4.2 mmol/L (ref 3.5–5.1)
Sodium: 136 mmol/L (ref 135–145)

## 2020-01-14 LAB — GLUCOSE, CAPILLARY
Glucose-Capillary: 324 mg/dL — ABNORMAL HIGH (ref 70–99)
Glucose-Capillary: 320 mg/dL — ABNORMAL HIGH (ref 70–99)
Glucose-Capillary: 73 mg/dL (ref 70–99)
Glucose-Capillary: 270 mg/dL — ABNORMAL HIGH (ref 70–99)

## 2020-01-14 LAB — CBC
HCT: 36.1 % — ABNORMAL LOW (ref 39.0–52.0)
Hemoglobin: 12.1 g/dL — ABNORMAL LOW (ref 13.0–17.0)
MCH: 30.5 pg (ref 26.0–34.0)
MCHC: 33.5 g/dL (ref 30.0–36.0)
MCV: 90.9 fL (ref 80.0–100.0)
Platelets: 197 10*3/uL (ref 150–400)
RBC: 3.97 MIL/uL — ABNORMAL LOW (ref 4.22–5.81)
RDW: 12 % (ref 11.5–15.5)
WBC: 12.4 10*3/uL — ABNORMAL HIGH (ref 4.0–10.5)
nRBC: 0 % (ref 0.0–0.2)

## 2020-01-14 MED ORDER — INSULIN ASPART 100 UNIT/ML ~~LOC~~ SOLN
20.0000 [IU] | Freq: Three times a day (TID) | SUBCUTANEOUS | Status: DC
  Administered 2020-01-15: 10:00:00 20 [IU] via SUBCUTANEOUS
  Filled 2020-01-14: qty 1

## 2020-01-14 MED ORDER — INSULIN GLARGINE 100 UNIT/ML ~~LOC~~ SOLN
50.0000 [IU] | Freq: Every day | SUBCUTANEOUS | Status: DC
  Administered 2020-01-15: 10:00:00 50 [IU] via SUBCUTANEOUS
  Filled 2020-01-14: qty 0.5

## 2020-01-14 MED ORDER — SODIUM CHLORIDE 0.9 % IV SOLN
1.0000 g | INTRAVENOUS | Status: DC
  Administered 2020-01-14: 1 g via INTRAVENOUS
  Filled 2020-01-14: qty 1
  Filled 2020-01-14: qty 10

## 2020-01-14 NOTE — Progress Notes (Signed)
PROGRESS NOTE    Joseph [name]  [id number] DOB: November 12, 1970 DOA: 01/11/2020 PCP: [facility] [place]   Brief Narrative:  49 year old male with chronic systolic CHF, hypertension, insulin-dependent diabetes mellitus, CAD with history of PCI, history of prostate cancer s/p robotic laparoscopic prostatectomy on 3/25 and discharged on Foley that was removed on 4/1 in the urology office presented to the ED with generalized body ache with chills and fever of 102 F.  Noted blood in urine since his Foley has been removed and also dysuria with pain in his penis and scrotum. In the ED patient was febrile with temperature of 100.6 F, normal blood pressure and sats. Blood work showed WBC 16 K and UA suggestive of UTI.  Patient tested negative for COVID-19.  CT abdomen pelvis showed an air and fluid collection in the left hemipelvis measuring 4.3 x 2.3 x 4.7 cm possible for developing abscess.  Also low-attenuation fluid collection along the left pelvic sidewall measuring 5.5 x 2.6 cm which is new and possible seroma versus hematoma versus developing lymphocele.  Also showed mild bladder wall thickening.  Patient started on empiric IV cefepime and admitted for severe sepsis.  Subjective: Patient has no new complaints today.  Assessment & Plan:   Principal Problem:   UTI (urinary tract infection) Active Problems:   Chronic systolic CHF (congestive heart failure) (HCC)   CAD (coronary artery disease)   Hyperglycemia due to type 2 diabetes mellitus (HCC)   S/P prostatectomy   Severe asthma without complication   Possible Postprocedural intraabdominal abscess  Severe sepsis without acute target organ damage (HCC) Likely secondary to UTI (catheter associated) with concern for fluid collection/abscess in the left hemipelvis.  Sepsis improving, T-max at 100.7 F and still has leukocytosis, although improving. Blood culture so far negative for growth.  Urine culture growing E. coli sensitive  to cephalosporins. Scrotal ultrasound with Doppler done given complaint of penile and scrotal pain yesterday and shows testicular microlithiasis bilaterally, small hydrocele on each side without any abscess, epididymitis.  Discussed CT findings with IR (Dr. Saunders Revel) who reviewed the images and recommended that the collection was very low in the pelvis and difficult to reach.  Suggested this could well be a lymphocele and not truly an abscess and recommended repeat CT in 72 hours depending upon his symptoms.  Surgery was consulted-appreciate their recommendations.  Recommending continuation of antibiotics and no further imaging.  -De-escalate antibiotics to Rocephin from cefepime according to sensitivity results.  Status post recent prostatectomy on 3/25 Followed by urology as outpatient.  Monitor strict I's/O.  Patient voiding without difficulty.  Dysuria has improved.  Chronic systolic CHF Euvolemic.  Off IV fluids.  - Continue Lasix, losartan, Aldactone, digoxin and Coreg.  Coronary artery disease Asymptomatic.  - Continue aspirin and statin.  Diabetes mellitus type 2, uncontrolled with hyperglycemia on  long-term insulin use A1c of 10.5.  CBG elevated. -Increase Lantus to 50 units. -Increase mealtime coverage to 20 units with meals.  History of asthma No further wheezing.   -Continue as needed nebs and home inhaler.  Objective: Vitals:   01/13/20 0902 01/13/20 2100 01/13/20 2351 01/14/20 0823  BP: 130/70 (!) 148/86 121/61 134/87  Pulse: 98 96 94 86  Resp:  16 15 16   Temp: 98.3 F (36.8 C) (!) 100.7 F (38.2 C) 98.3 F (36.8 C) 98.2 F (36.8 C)  TempSrc: Oral Oral  Oral  SpO2: 97% 100% 98% 99%  Weight:      Height:  Intake/Output Summary (Last 24 hours) at 01/14/2020 1511 Last data filed at 01/14/2020 1357 Gross per 24 hour  Intake 480 ml  Output 850 ml  Net -370 ml   Filed Weights   01/11/20 2237  Weight: 93.9 kg    Examination:  General exam:  Appears calm and comfortable  Respiratory system: Clear to auscultation. Respiratory effort normal. Cardiovascular system: S1 & S2 heard, RRR. No JVD, murmurs, rubs, gallops or clicks. Gastrointestinal system: Soft, nontender, nondistended, bowel sounds positive. Central nervous system: Alert and oriented. No focal neurological deficits.Symmetric 5 x 5 power. Extremities: No edema, no cyanosis, pulses intact and symmetrical. Psychiatry: Judgement and insight appear normal. Mood & affect appropriate.    DVT prophylaxis: Lovenox Code Status: Full Family Communication: Patient was updated. Disposition Plan: Patient will go back home, possibly within next 1 to 2 days if remained afebrile.  Consultants:   Urology  Procedures:  CT abdomen and pelvis Scrotal Doppler.  Antimicrobials:  Rocephin  Data Reviewed: I have personally reviewed following labs and imaging studies  CBC: Recent Labs  Lab 01/11/20 2253 01/12/20 0515 01/13/20 0535 01/14/20 0336  WBC 16.1* 15.9* 16.3* 12.4*  NEUTROABS 12.3*  --   --   --   HGB 13.7 13.0 12.3* 12.1*  HCT 39.5 39.1 37.6* 36.1*  MCV 87.8 91.4 92.8 90.9  PLT 187 154 172 [id number]   Basic Metabolic Panel: Recent Labs  Lab 01/11/20 2253 01/12/20 0515 01/13/20 0535 01/14/20 0336  NA 134*  --  138 136  K 4.1  --  4.5 4.2  CL 102  --  107 107  CO2 25  --  27 23  GLUCOSE 337*  --  214* 317*  BUN 16  --  12 21*  CREATININE 0.91 0.85 0.96 0.95  CALCIUM 8.3*  --  8.2* 8.1*   GFR: Estimated Creatinine Clearance: 106.3 mL/min (by C-G formula based on SCr of 0.95 mg/dL). Liver Function Tests: Recent Labs  Lab 01/11/20 2253  AST 21  ALT 24  ALKPHOS 78  BILITOT 1.2  PROT 6.7  ALBUMIN 3.2*   No results for input(s): LIPASE, AMYLASE in the last 168 hours. No results for input(s): AMMONIA in the last 168 hours. Coagulation Profile: Recent Labs  Lab 01/11/20 2253  INR 1.2   Cardiac Enzymes: No results for input(s): CKTOTAL, CKMB,  CKMBINDEX, TROPONINI in the last 168 hours. BNP (last 3 results) No results for input(s): PROBNP in the last 8760 hours. HbA1C: No results for input(s): HGBA1C in the last 72 hours. CBG: Recent Labs  Lab 01/13/20 1213 01/13/20 1629 01/13/20 2128 01/14/20 0805 01/14/20 1135  GLUCAP 258* 273* 136* 324* 320*   Lipid Profile: No results for input(s): CHOL, HDL, LDLCALC, TRIG, CHOLHDL, LDLDIRECT in the last 72 hours. Thyroid Function Tests: No results for input(s): TSH, T4TOTAL, FREET4, T3FREE, THYROIDAB in the last 72 hours. Anemia Panel: No results for input(s): VITAMINB12, FOLATE, FERRITIN, TIBC, IRON, RETICCTPCT in the last 72 hours. Sepsis Labs: Recent Labs  Lab 01/11/20 2253  LATICACIDVEN 1.1    Recent Results (from the past 240 hour(s))  Culture, blood (Routine x 2)     Status: None (Preliminary result)   Collection Time: 01/11/20 10:53 PM   Specimen: BLOOD  Result Value Ref Range Status   Specimen Description BLOOD LEFT HAND  Final   Special Requests   Final    BOTTLES DRAWN AEROBIC AND ANAEROBIC Blood Culture results may not be optimal due to an excessive volume of blood  received in culture bottles   Culture   Final    NO GROWTH 3 DAYS Performed at [facility], [address] [place], [place] [address]    Report Status PENDING  Incomplete  Culture, blood (Routine x 2)     Status: None (Preliminary result)   Collection Time: 01/11/20 11:17 PM   Specimen: BLOOD  Result Value Ref Range Status   Specimen Description BLOOD LEFT WRIST  Final   Special Requests   Final    BOTTLES DRAWN AEROBIC AND ANAEROBIC Blood Culture results may not be optimal due to an inadequate volume of blood received in culture bottles   Culture   Final    NO GROWTH 3 DAYS Performed at [facility], [address], [place], [place] [address]    Report Status PENDING  Incomplete  Urine Culture     Status: Abnormal   Collection Time: 01/12/20 12:10 AM   Specimen: Urine,  Random  Result Value Ref Range Status   Specimen Description   Final    URINE, RANDOM Performed at [facility], [address], [place], [place] [address]    Special Requests   Final    NONE Performed at [facility], [address] [place], [place] [address]    Culture >=100,000 COLONIES/mL ESCHERICHIA COLI (A)  Final   Report Status 01/13/2020 FINAL  Final   Organism ID, Bacteria ESCHERICHIA COLI (A)  Final      Susceptibility   Escherichia coli - MIC*    AMPICILLIN >=32 RESISTANT Resistant     CEFAZOLIN 16 SENSITIVE Sensitive     CEFTRIAXONE <=0.25 SENSITIVE Sensitive     CIPROFLOXACIN <=0.25 SENSITIVE Sensitive     GENTAMICIN <=1 SENSITIVE Sensitive     IMIPENEM <=0.25 SENSITIVE Sensitive     NITROFURANTOIN <=16 SENSITIVE Sensitive     TRIMETH/SULFA <=20 SENSITIVE Sensitive     AMPICILLIN/SULBACTAM >=32 RESISTANT Resistant     PIP/TAZO <=4 SENSITIVE Sensitive     * >=100,000 COLONIES/mL ESCHERICHIA COLI  Respiratory Panel by RT PCR (Flu A&B, Covid) - Nasopharyngeal Swab     Status: None   Collection Time: 01/12/20  1:49 AM   Specimen: Nasopharyngeal Swab  Result Value Ref Range Status   SARS Coronavirus 2 by RT PCR NEGATIVE NEGATIVE Final    Comment: (NOTE) SARS-CoV-2 target nucleic acids are NOT DETECTED. The SARS-CoV-2 RNA is generally detectable in upper respiratoy specimens during the acute phase of infection. The lowest concentration of SARS-CoV-2 viral copies this assay can detect is 131 copies/mL. A negative result does not preclude SARS-Cov-2 infection and should not be used as the sole basis for treatment or other patient management decisions. A negative result may occur with  improper specimen collection/handling, submission of specimen other than nasopharyngeal swab, presence of viral mutation(s) within the areas targeted by this assay, and inadequate number of viral copies (<131 copies/mL). A negative result must be combined with  clinical observations, patient history, and epidemiological information. The expected result is Negative. Fact Sheet for Patients:  PinkCheek.be Fact Sheet for Healthcare Providers:  GravelBags.it This test is not yet ap proved or cleared by the [place] FDA and  has been authorized for detection and/or diagnosis of SARS-CoV-2 by FDA under an Emergency Use Authorization (EUA). This EUA will remain  in effect (meaning this test can be used) for the duration of the COVID-19 declaration under Section 564(b)(1) of the Act, 21 U.S.C. section 360bbb-3(b)(1), unless the authorization is terminated or  revoked sooner.    Influenza A by PCR NEGATIVE NEGATIVE Final   Influenza B by PCR NEGATIVE NEGATIVE Final    Comment: (NOTE) The Xpert Xpress SARS-CoV-2/FLU/RSV assay is intended as an aid in  the diagnosis of influenza from Nasopharyngeal swab specimens and  should not be used as a sole basis for treatment. Nasal washings and  aspirates are unacceptable for Xpert Xpress SARS-CoV-2/FLU/RSV  testing. Fact Sheet for Patients: PinkCheek.be Fact Sheet for Healthcare Providers: GravelBags.it This test is not yet approved or cleared by the [place] FDA and  has been authorized for detection and/or diagnosis of SARS-CoV-2 by  FDA under an Emergency Use Authorization (EUA). This EUA will remain  in effect (meaning this test can be used) for the duration of the  Covid-19 declaration under Section 564(b)(1) of the Act, 21  U.S.C. section 360bbb-3(b)(1), unless the authorization is  terminated or revoked. Performed at [facility], [address], [place], [place] [address]      Radiology Studies: No results found.  Scheduled Meds: . budesonide  1 mg Nebulization BID  . carvedilol  25 mg Oral BID  . Chlorhexidine Gluconate Cloth  6 each Topical Daily  .  digoxin  125 mcg Oral Daily  . docusate sodium  100 mg Oral BID  . enoxaparin (LOVENOX) injection  40 mg Subcutaneous Q24H  . furosemide  20 mg Oral BID  . insulin aspart  0-15 Units Subcutaneous TID WC  . insulin aspart  0-5 Units Subcutaneous QHS  . insulin aspart  20 Units Subcutaneous TID WC  . [START ON 01/15/2020] insulin glargine  50 Units Subcutaneous Daily  . losartan  100 mg Oral QHS  . montelukast  10 mg Oral QHS  . multivitamin with minerals  1 tablet Oral Daily  . pantoprazole  40 mg Oral Daily  . Ensure Max Protein  11 oz Oral BID  . spironolactone  12.5 mg Oral Daily   Continuous Infusions: . cefTRIAXone (ROCEPHIN)  IV 1 g (01/14/20 1356)     LOS: 2 days   Time spent: 40 minutes  Lorella Nimrod, MD [facility]  If 7PM-7AM, please contact night-coverage [email or web address]  01/14/2020, 3:11 PM   This record has been created using Systems analyst. Errors have been sought and corrected,but may not always be located. Such creation errors do not reflect on the standard of care.

## 2020-01-14 NOTE — Progress Notes (Signed)
Urology Inpatient Progress Note  Subjective: Joseph [name] is a 49 y.o. male admitted on 01/11/2020 with febrile UTI following RALP.  Urine cultures resulted with ampicillin resistant E. coli, blood cultures pending with no growth at 3 days.  On antibiotics as below  Creatinine stable at 0.95.  WBC count down today, 12.4.  He is afebrile, VSS.  IR reviewed patient's imaging this morning does not believe his pelvic fluid represents a drainable collection.  Today, patient reports feeling better since initiation of antibiotics.  He does continue to have some dysuria, abdominal fullness following surgery, and stable penile and scrotal soreness.  He denies changes in penile or scrotal edema or tenderness.  Anti-infectives: Anti-infectives (From admission, onward)   Start     Dose/Rate Route Frequency Ordered Stop   01/12/20 0600  ceFEPIme (MAXIPIME) 2 g in sodium chloride 0.9 % 100 mL IVPB     2 g 200 mL/hr over 30 Minutes Intravenous Every 8 hours 01/12/20 0408     01/12/20 0500  cefTRIAXone (ROCEPHIN) 1 g in sodium chloride 0.9 % 100 mL IVPB  Status:  Discontinued     1 g 200 mL/hr over 30 Minutes Intravenous  Once 01/12/20 0338 01/12/20 0407   01/12/20 0345  metroNIDAZOLE (FLAGYL) IVPB 500 mg     500 mg 100 mL/hr over 60 Minutes Intravenous  Once 01/12/20 0338 01/12/20 0739   01/12/20 0045  cefTRIAXone (ROCEPHIN) 1 g in sodium chloride 0.9 % 100 mL IVPB     1 g 200 mL/hr over 30 Minutes Intravenous  Once 01/12/20 0037 01/12/20 0147      Current Facility-Administered Medications  Medication Dose Route Frequency Provider Last Rate Last Admin  . acetaminophen (TYLENOL) tablet 650 mg  650 mg Oral Q6H PRN Athena Masse, MD   650 mg at 01/13/20 2125   Or  . acetaminophen (TYLENOL) suppository 650 mg  650 mg Rectal Q6H PRN Athena Masse, MD      . albuterol (PROVENTIL) (2.5 MG/3ML) 0.083% nebulizer solution 2.5 mg  2.5 mg Nebulization Q6H PRN Dhungel, Nishant, MD      . alum & mag  hydroxide-simeth (MAALOX/MYLANTA) 200-200-20 MG/5ML suspension 15 mL  15 mL Oral Q6H PRN Dhungel, Nishant, MD   15 mL at 01/13/20 1223  . budesonide (PULMICORT) nebulizer solution 1 mg  1 mg Nebulization BID Dhungel, Nishant, MD   1 mg at 01/14/20 0733  . carvedilol (COREG) tablet 25 mg  25 mg Oral BID Dhungel, Nishant, MD   25 mg at 01/14/20 0902  . ceFEPIme (MAXIPIME) 2 g in sodium chloride 0.9 % 100 mL IVPB  2 g Intravenous Q8H Hall, Scott A, RPH 200 mL/hr at 01/14/20 0617 2 g at 01/14/20 0617  . Chlorhexidine Gluconate Cloth 2 % PADS 6 each  6 each Topical Daily Dhungel, Nishant, MD   6 each at 01/13/20 0920  . digoxin (LANOXIN) tablet 125 mcg  125 mcg Oral Daily Dhungel, Nishant, MD   125 mcg at 01/14/20 0902  . docusate sodium (COLACE) capsule 100 mg  100 mg Oral BID Dhungel, Nishant, MD   100 mg at 01/14/20 0902  . enoxaparin (LOVENOX) injection 40 mg  40 mg Subcutaneous Q24H Judd Gaudier V, MD   40 mg at 01/13/20 0917  . furosemide (LASIX) tablet 20 mg  20 mg Oral BID Dhungel, Nishant, MD   20 mg at 01/14/20 0902  . HYDROcodone-acetaminophen (NORCO/VICODIN) 5-325 MG per tablet 1 tablet  1 tablet Oral Q6H  PRN Louellen Molder, MD   1 tablet at 01/14/20 0620  . insulin aspart (novoLOG) injection 0-15 Units  0-15 Units Subcutaneous TID WC Athena Masse, MD   11 Units at 01/14/20 [phone]  . insulin aspart (novoLOG) injection 0-5 Units  0-5 Units Subcutaneous QHS Judd Gaudier V, MD      . insulin aspart (novoLOG) injection 25 Units  25 Units Subcutaneous TID WC Dhungel, Nishant, MD   25 Units at 01/14/20 0900  . insulin glargine (LANTUS) injection 44 Units  44 Units Subcutaneous Daily Dhungel, Nishant, MD   44 Units at 01/14/20 0900  . losartan (COZAAR) tablet 100 mg  100 mg Oral QHS Dhungel, Nishant, MD   100 mg at 01/13/20 2125  . montelukast (SINGULAIR) tablet 10 mg  10 mg Oral QHS Dhungel, Nishant, MD   10 mg at 01/13/20 2125  . multivitamin with minerals tablet 1 tablet  1 tablet Oral Daily  Dhungel, Nishant, MD   1 tablet at 01/14/20 0902  . ondansetron (ZOFRAN) tablet 4 mg  4 mg Oral Q6H PRN Athena Masse, MD       Or  . ondansetron [facility]) injection 4 mg  4 mg Intravenous Q6H PRN Athena Masse, MD      . oxybutynin (DITROPAN) tablet 5 mg  5 mg Oral Q8H PRN Dhungel, Nishant, MD      . pantoprazole (PROTONIX) EC tablet 40 mg  40 mg Oral Daily Dhungel, Nishant, MD   40 mg at 01/14/20 0902  . protein supplement (ENSURE MAX) liquid  11 oz Oral BID Dhungel, Nishant, MD   11 oz at 01/13/20 2125  . senna-docusate (Senokot-S) tablet 1 tablet  1 tablet Oral QHS PRN Athena Masse, MD      . spironolactone (ALDACTONE) tablet 12.5 mg  12.5 mg Oral Daily Dhungel, Nishant, MD   12.5 mg at 01/14/20 0902     Objective: Vital signs in last 24 hours: Temp:  [98.2 F (36.8 C)-100.7 F (38.2 C)] 98.2 F (36.8 C) (04/05 0823) Pulse Rate:  [86-96] 86 (04/05 0823) Resp:  [15-16] 16 (04/05 0823) BP: (121-148)/(61-87) 134/87 (04/05 0823) SpO2:  [98 %-100 %] 99 % (04/05 0823)  Intake/Output from previous day: 04/04 0701 - 04/05 0700 In: 0  Out: 200 [Urine:200] Intake/Output this shift: Total I/O In: -  Out: 250 [Urine:250]  Physical Exam Vitals and nursing note reviewed.  Constitutional:      General: He is not in acute distress.    Appearance: He is not ill-appearing, toxic-appearing or diaphoretic.  HENT:     Head: Normocephalic and atraumatic.  Pulmonary:     Effort: Pulmonary effort is normal. No respiratory distress.  Abdominal:     Tenderness: There is no guarding or rebound.     Comments: Slight right-sided fullness and postoperative soreness.  Surgical incisions well healing and clean, dry, and intact with overlying surgical adhesive.  Skin:    General: Skin is warm and dry.  Neurological:     Mental Status: He is alert and oriented to person, place, and time.  Psychiatric:        Mood and Affect: Mood normal.        Behavior: Behavior normal.    Lab Results:   Recent Labs    01/13/20 0535 01/14/20 0336  WBC 16.3* 12.4*  HGB 12.3* 12.1*  HCT 37.6* 36.1*  PLT 172 197   BMET Recent Labs    01/13/20 0535 01/14/20 0336  NA 138  136  K 4.5 4.2  CL 107 107  CO2 27 23  GLUCOSE 214* 317*  BUN 12 21*  CREATININE 0.96 0.95  CALCIUM 8.2* 8.1*   PT/INR Recent Labs    01/11/20 2253  LABPROT 14.9  INR 1.2   Assessment & Plan: 49 year old male s/p RALP and Foley catheter removal who subsequently developed febrile UTI.  Recovering well on culture-appropriate antibiotics: Dysuria improving, leukocytosis downtrending.  IR deferring fluid collection abscess, no procedure planned for today.  Recommendations: -Advance diet -Narrow abx per urine culture -OK to discharge on 10-14 days culture-appropriate antibiotics; pt to keep previously-scheduled postop appts  Debroah Loop, PA-C 01/14/2020

## 2020-01-14 NOTE — Progress Notes (Addendum)
Inpatient Diabetes Program Recommendations  AACE/ADA: New Consensus Statement on Inpatient Glycemic Control (2015)  Target Ranges:  Prepandial:   less than 140 mg/dL      Peak postprandial:   less than 180 mg/dL (1-2 hours)      Critically ill patients:  140 - 180 mg/dL   Lab Results  Component Value Date   GLUCAP 324 (H) 01/14/2020   HGBA1C 10.5 (H) 01/03/2020    Review of Glycemic Control Results for Joseph [name], Joseph [name] (MRN [id number]) as of 01/14/2020 08:54  Ref. Range 01/13/2020 09:01 01/13/2020 12:13 01/13/2020 16:29 01/13/2020 21:28 01/14/2020 08:05  Glucose-Capillary Latest Ref Range: 70 - 99 mg/dL 318 (H) 258 (H) 273 (H) 136 (H) 324 (H)   Diabetes history: DM 2 Outpatient Diabetes medications: Toujeo 44 units Daily, Humalog 18 units breakfast -20 units lunch -25 units supper Freestyle libre Current orders for Inpatient glycemic control:  Lantus 44 units Daily Novolog 0-15 units tid + hs Novolog 25 units tid meal coverage  Inpatient Diabetes Program Recommendations:    Noted A1c 10.5% on 3/25. Pt had received steroids that admission and now with active infection.  Fasting glucose >300's. Consider increasing Lantus to 50 units.  Decrease Novolog meal coverage to 20 units tid.  Addendum 1109 am:  Spoke with pt at bedside regarding A1c level and glucose control at home. Pt reports checking glucose tid at home and has been running high lately he is working with his Endocrinologist to get his insulins as his insurance coverage is different. Pt has plenty of insulin at this time. Pt reports having an appointment within 2 weeks. Spoke with pt about needing adjustment and calling his Endocrinologist in between appointments if needed. Discussed current A1c 10.5% and discussed glucose and A1c goals.  Thanks,  Tama Headings RN, MSN, BC-ADM Inpatient Diabetes Coordinator Team Pager [phone] (8a-5p)

## 2020-01-14 NOTE — Plan of Care (Signed)
  Problem: Urinary Elimination: Goal: Signs and symptoms of infection will decrease Outcome: Progressing   Problem: Education: Goal: Knowledge of General Education information will improve Description: Including pain rating scale, medication(s)/side effects and non-pharmacologic comfort measures Outcome: Progressing   Problem: Health Behavior/Discharge Planning: Goal: Ability to manage health-related needs will improve Outcome: Progressing   Problem: Clinical Measurements: Goal: Ability to maintain clinical measurements within normal limits will improve Outcome: Progressing Goal: Will remain free from infection Outcome: Progressing Goal: Diagnostic test results will improve Outcome: Progressing Goal: Respiratory complications will improve Outcome: Progressing Goal: Cardiovascular complication will be avoided Outcome: Progressing   Problem: Activity: Goal: Risk for activity intolerance will decrease Outcome: Progressing   Problem: Nutrition: Goal: Adequate nutrition will be maintained Outcome: Progressing   Problem: Nutrition: Goal: Adequate nutrition will be maintained Outcome: Progressing   Problem: Coping: Goal: Level of anxiety will decrease Outcome: Progressing   Problem: Elimination: Goal: Will not experience complications related to bowel motility Outcome: Progressing Goal: Will not experience complications related to urinary retention Outcome: Progressing   Problem: Pain Managment: Goal: General experience of comfort will improve Outcome: Progressing   Problem: Safety: Goal: Ability to remain free from injury will improve Outcome: Progressing   Problem: Skin Integrity: Goal: Risk for impaired skin integrity will decrease Outcome: Progressing

## 2020-01-15 LAB — CBC
HCT: 36.4 % — ABNORMAL LOW (ref 39.0–52.0)
Hemoglobin: 12.3 g/dL — ABNORMAL LOW (ref 13.0–17.0)
MCH: 30 pg (ref 26.0–34.0)
MCHC: 33.8 g/dL (ref 30.0–36.0)
MCV: 88.8 fL (ref 80.0–100.0)
Platelets: 211 10*3/uL (ref 150–400)
RBC: 4.1 MIL/uL — ABNORMAL LOW (ref 4.22–5.81)
RDW: 12 % (ref 11.5–15.5)
WBC: 8.3 10*3/uL (ref 4.0–10.5)
nRBC: 0 % (ref 0.0–0.2)

## 2020-01-15 LAB — GLUCOSE, CAPILLARY
Glucose-Capillary: 246 mg/dL — ABNORMAL HIGH (ref 70–99)
Glucose-Capillary: 299 mg/dL — ABNORMAL HIGH (ref 70–99)

## 2020-01-15 MED ORDER — CARVEDILOL 25 MG PO TABS: 25.0000 mg | ORAL_TABLET | Freq: Two times a day (BID) | ORAL | 0 refills | Status: DC

## 2020-01-15 MED ORDER — DIGOXIN 125 MCG PO TABS: 125.0000 ug | ORAL_TABLET | Freq: Every day | ORAL | 0 refills | Status: DC

## 2020-01-15 MED ORDER — CEFDINIR 300 MG PO CAPS: 300.0000 mg | ORAL_CAPSULE | Freq: Two times a day (BID) | ORAL | 0 refills | Status: AC

## 2020-01-15 MED ORDER — SPIRONOLACTONE 25 MG PO TABS: 12.5000 mg | ORAL_TABLET | Freq: Every day | ORAL | 0 refills | Status: DC

## 2020-01-15 MED ORDER — TOUJEO MAX SOLOSTAR 300 UNIT/ML ~~LOC~~ SOPN: 50.0000 [IU] | PEN_INJECTOR | Freq: Every day | SUBCUTANEOUS | 0 refills | Status: DC

## 2020-01-15 NOTE — Progress Notes (Signed)
Pt for discharge home. A/o. No resp distress. Sl d/cd earlier . Instructions discussed with pt.  Diet / meds  presc / activity and f/u discussed. Verbalized understanding.  Out at this time via w/c w/o c/o.

## 2020-01-15 NOTE — Progress Notes (Signed)
Inpatient Diabetes Program Recommendations  AACE/ADA: New Consensus Statement on Inpatient Glycemic Control (2015)  Target Ranges:  Prepandial:   less than 140 mg/dL      Peak postprandial:   less than 180 mg/dL (1-2 hours)      Critically ill patients:  140 - 180 mg/dL   Lab Results  Component Value Date   GLUCAP 246 (H) 01/15/2020   HGBA1C 10.5 (H) 01/03/2020    Review of Glycemic Control  Results for ADEMIR, BERENGUER (MRN [id number]) as of 01/15/2020 09:22  Ref. Range 01/14/2020 08:05 01/14/2020 11:35 01/14/2020 17:10 01/14/2020 21:05 01/15/2020 07:37  Glucose-Capillary Latest Ref Range: 70 - 99 mg/dL 324 (H) 320 (H) 73 270 (H) 246 (H)   Diabetes history: DM 2 Outpatient Diabetes medications: Toujeo 44 units Daily, Humalog 18 units breakfast -20 units lunch -25 units supper Freestyle libre Current orders for Inpatient glycemic control:  Lantus 50 units Daily Novolog 0-15 units tid + hs Novolog 20 units tid meal coverage  Inpatient Diabetes Program Recommendations:    Noted A1c 10.5% on 3/25. Pt had received steroids that admission and now with active infection.  Fasting glucose >246 thisam. To get increased dose of Lantus 50 units this am. Will watch trends today.  Thanks,  Tama Headings RN, MSN, BC-ADM Inpatient Diabetes Coordinator Team Pager [phone] (8a-5p)

## 2020-01-15 NOTE — Plan of Care (Signed)

## 2020-01-15 NOTE — Discharge Summary (Signed)
Physician Discharge Summary  Joseph [name] [id number] DOB: [birth year] DOA: 01/11/2020  PCP: [facility] [place] date: 01/11/2020 Discharge date: 01/15/2020  Admitted From: Home Disposition: Home  Recommendations for Outpatient Follow-up:  1. Follow up with PCP in 1-2 weeks 2. Follow-up with urology 3. Please obtain BMP/CBC in one week 4. Please follow up on the following pending results: None  Home Health: No Equipment/Devices: None Discharge Condition: Stable CODE STATUS: Full Diet recommendation: Heart Healthy / Carb Modified   Brief/Interim Summary: 49 year old male with chronic systolic CHF, hypertension, insulin-dependent diabetes mellitus, CAD with history of PCI, history of prostate cancer s/p robotic laparoscopic prostatectomy on 3/25 and discharged on Foley that was removed on 4/1 in the urology office presented to the ED with generalized body ache with chills and fever of 102 F. Noted blood in urine since his Foley has been removed and also dysuria with pain in his penis and scrotum. In the ED patient was febrile with temperature of 100.6 F, normal blood pressure and sats. Blood work showed WBC 16 K and UA suggestive of UTI. Patient tested negative for COVID-19. CT abdomen pelvis showed an air and fluid collection in the left hemipelvis measuring 4.3 x 2.3 x 4.7 cm possible for developing abscess. Also low-attenuation fluid collection along the left pelvic sidewall measuring 5.5 x 2.6 cm which is new and possible seroma versus hematoma versus developing lymphocele. Also showed mild bladder wall thickening.  Patient started on empiric IV cefepime and admitted for severe sepsis, likely secondary to her associated UTI.  Urine culture with E. coli sensitive to cephalosporin.  Antibiotics were deescalated to ceftriaxone after resolution of fever and leukocytosis and he was discharge home on cefdinir to complete a 7-day course.  Discussed CT findings with IR  (Dr. Donald Pore reviewed the images and recommended that the collection was very low in the pelvis and difficult to reach. Suggested this could well be a lymphocele and not truly an abscess and recommended repeat CT in 72 hours depending upon his symptoms.  Urology was also consulted and they do not think that he needs a repeat CT at this time due to improvement in his symptoms.  They think it is most likely a fluid collection secondary to recent procedure.  Patient will follow up with urology as an outpatient.  Patient has an history of chronic CHF.  Remained stable and euvolemic during current hospitalization and will continue his home dose of Lasix, losartan, Aldactone, digoxin and Coreg and will follow up with his cardiologist.  He also has uncontrolled diabetes with A1c of 10.5.  His home dose of Lantus was increased to 50 units with mealtime coverage of 20 units and he will also use sliding scale.  He has an upcoming follow-up appointment with his endocrinologist in a week.  There was no acute exacerbation of his asthma during current hospitalization.  He will continue with his home meds.   Discharge Diagnoses:  Principal Problem:   UTI (urinary tract infection) Active Problems:   Chronic systolic CHF (congestive heart failure) (HCC)   CAD (coronary artery disease)   Hyperglycemia due to type 2 diabetes mellitus (HCC)   S/P prostatectomy   Severe asthma without complication   Possible Postprocedural intraabdominal abscess   Cystitis   Scrotal pain  Discharge Instructions  Discharge Instructions    Diet - low sodium heart healthy   Complete by: As directed    Discharge instructions   Complete by: As directed  It was pleasure taking care of you. Please use your antibiotics as directed and complete the course for 7 days. We increased the dose of your Lantus to 50 units daily as your blood glucose remained high, continue using NovoLog according to your sliding scale and check  your blood glucose level multiple times a day. Continue rest of your medications and follow-up with your urologist.   Increase activity slowly   Complete by: As directed      Allergies as of 01/15/2020      Reactions   Vancomycin Shortness Of Breath   Lisinopril Rash      Medication List    STOP taking these medications   HumaLOG KwikPen 100 UNIT/ML KwikPen Generic drug: insulin lispro     TAKE these medications   acetaminophen 500 MG tablet Commonly known as: TYLENOL Take 500 mg by mouth every 6 (six) hours as needed for fever.   albuterol 108 (90 Base) MCG/ACT inhaler Commonly known as: Proventil HFA Inhale 2 puffs into the lungs every 4 (four) hours as needed for wheezing or shortness of breath.   aspirin EC 81 MG tablet Take 81 mg by mouth every morning.   B-D ULTRAFINE III SHORT PEN 31G X 8 MM Misc Generic drug: Insulin Pen Needle USE 5 TIMES DAILY AS  DIRECTED   budesonide 1 MG/2ML nebulizer solution Commonly known as: PULMICORT Inhale 1 mg into the lungs daily as needed. Pt has not gotten Rx filled as of 12-31-19   carvedilol 25 MG tablet Commonly known as: COREG Take 1 tablet (25 mg total) by mouth 2 (two) times daily.   cefdinir 300 MG capsule Commonly known as: OMNICEF Take 1 capsule (300 mg total) by mouth 2 (two) times daily for 7 days.   D3-1000 25 MCG (1000 UT) capsule Generic drug: Cholecalciferol Take 1,000 Units by mouth daily.   digoxin 0.125 MG tablet Commonly known as: LANOXIN Take 1 tablet (125 mcg total) by mouth daily. What changed: when to take this   docusate sodium 100 MG capsule Commonly known as: COLACE Take 1 capsule (100 mg total) by mouth 2 (two) times daily.   fexofenadine-pseudoephedrine 60-120 MG 12 hr tablet Commonly known as: ALLEGRA-D Take 1 tablet by mouth 2 (two) times daily. What changed:   when to take this  reasons to take this   fluticasone 50 MCG/ACT nasal spray Commonly known as: FLONASE Place 2 sprays  into both nostrils daily as needed for allergies.   FreeStyle Libre 14 Day Reader Kerrin Mo Use 1 Device continuously as needed   [employer] 14 Day Sensor Misc Use 1 Device every 14 (fourteen) days   furosemide 20 MG tablet Commonly known as: LASIX Take 1 tablet (20 mg total) by mouth 2 (two) times daily.   GlucaGen Diagnostic 1 MG injection Generic drug: glucagon (human recombinant) Inject into the muscle.   glucagon 1 MG injection Inject into the muscle.   insulin aspart 100 UNIT/ML FlexPen Commonly known as: NOVOLOG Inject 18-20-22 u TID AC plus sliding scale for up to 75 units TDD   loratadine 10 MG tablet Commonly known as: CLARITIN Take 10 mg by mouth daily as needed.   losartan 100 MG tablet Commonly known as: COZAAR Take 1 tablet (100 mg total) by mouth at bedtime.   montelukast 10 MG tablet Commonly known as: SINGULAIR Take by mouth.   naproxen sodium 220 MG tablet Commonly known as: ALEVE Take 220 mg by mouth daily as needed.   oxybutynin 5  MG tablet Commonly known as: DITROPAN Take 1 tablet (5 mg total) by mouth every 8 (eight) hours as needed for bladder spasms.   oxyCODONE-acetaminophen 5-325 MG tablet Commonly known as: PERCOCET/ROXICET Take 1-2 tablets by mouth every 6 (six) hours as needed for moderate pain.   pantoprazole 40 MG tablet Commonly known as: PROTONIX Take 1 tablet (40 mg total) by mouth daily. What changed:   when to take this  reasons to take this   spironolactone 25 MG tablet Commonly known as: ALDACTONE Take 0.5 tablets (12.5 mg total) by mouth daily.   Toujeo Max SoloStar 300 UNIT/ML Solostar Pen Generic drug: insulin glargine (2 Unit Dial) Inject 50 Units into the skin daily. What changed: how much to take       Allergies  Allergen Reactions  . Vancomycin Shortness Of Breath  . Lisinopril Rash    Consultations:  Urology  IR  Procedures/Studies: DG Chest 2 View  Result Date: 01/11/2020 CLINICAL DATA:   Sepsis. EXAM: CHEST - 2 VIEW COMPARISON:  09/12/2019 FINDINGS: There is a left-sided ICD in place. There are trace bilateral pleural effusions. There is no pneumothorax. No large focal infiltrate. No acute osseous abnormality. There is a mild amount of atelectasis at the lung bases. IMPRESSION: Trace bilateral pleural effusions with mild bibasilar atelectasis. Electronically Signed   By: Constance Holster M.D.   On: 01/11/2020 23:02   CT ABDOMEN PELVIS W CONTRAST  Result Date: 01/12/2020 CLINICAL DATA:  Abdominal pain. Fever postop. Recent prostate surgery. Hematuria. EXAM: CT ABDOMEN AND PELVIS WITH CONTRAST TECHNIQUE: Multidetector CT imaging of the abdomen and pelvis was performed using the standard protocol following bolus administration of intravenous contrast. CONTRAST:  130mL OMNIPAQUE IOHEXOL 300 MG/ML  SOLN COMPARISON:  CT dated December 28, 2019 FINDINGS: Lower chest: There are trace bilateral pleural effusions.The heart size is normal. Hepatobiliary: The liver is normal. Status post cholecystectomy.There is no biliary ductal dilation. Pancreas: Normal contours without ductal dilatation. No peripancreatic fluid collection. Spleen: No splenic laceration or hematoma. Adrenals/Urinary Tract: --Adrenal glands: No adrenal hemorrhage. --Right kidney/ureter: No hydronephrosis or perinephric hematoma. --Left kidney/ureter: No hydronephrosis or perinephric hematoma. --Urinary bladder: There is mild diffuse bladder wall thickening. There is air within the urinary bladder. Stomach/Bowel: --Stomach/Duodenum: No hiatal hernia or other gastric abnormality. Normal duodenal course and caliber. --Small bowel: No dilatation or inflammation. --Colon: No focal abnormality. --Appendix: Normal. Vascular/Lymphatic: Normal course and caliber of the major abdominal vessels. --there are few mildly prominent retroperitoneal lymph nodes that are non pathologically enlarged. --No mesenteric lymphadenopathy. --No pelvic or inguinal  lymphadenopathy. Reproductive: The patient is now status post prostatectomy. In the left hemipelvis there is an air and fluid collection measuring approximately 4.3 x 2.3 by 4.7 cm (axial series 2, image 74). This may represent a developing abscess in the appropriate clinical setting. There are additional pockets of gas in the low anterior pelvis that are presumably postsurgical in etiology. There are inflammatory changes in the patient's pelvis presumably reactive. There is a low-attenuation fluid collection along the patient's left pelvic sidewall measuring approximately 5.5 x 2.6 cm. This is new since the patient's recent prior CT and may represent a postoperative seroma, hematoma, or developing lymphocele. Other: There is some mild fat stranding the patient's anterior abdominal wall, specifically about the umbilicus. There is a small associated pockets of gas at this level. These findings are likely related to the patient's recent surgical intervention. Musculoskeletal. No acute displaced fractures. IMPRESSION: 1. There is an air and fluid collection  in the left hemipelvis measuring approximately 4.3 x 2.3 by 4.7 cm. This may represent a developing abscess in the appropriate clinical setting. 2. There is a low-attenuation fluid collection along the left pelvic sidewall measuring approximately 5.5 x 2.6 cm. This is new since the patient's recent prior CT and may represent a postoperative seroma, hematoma, or developing lymphocele. 3. Postop changes are noted related to the patient's interval prostatectomy. 4. Mild bladder wall thickening which may be reactive. However correlation with urinalysis is recommended. There is a small volume of air within the urinary bladder likely related to instrumentation. 5. Trace bilateral pleural effusions. Electronically Signed   By: Constance Holster M.D.   On: 01/12/2020 02:38   CT HEMATURIA WORKUP  Result Date: 12/28/2019 CLINICAL DATA:  Microscopic hematuria. Stage IIa  prostate adenocarcinoma. Mild testicular pain. EXAM: CT ABDOMEN AND PELVIS WITHOUT AND WITH CONTRAST TECHNIQUE: Multidetector CT imaging of the abdomen and pelvis was performed following the standard protocol before and following the bolus administration of intravenous contrast. CONTRAST:  132mL OMNIPAQUE IOHEXOL 300 MG/ML  SOLN COMPARISON:  None. FINDINGS: Lower chest: Defibrillator lead noted projecting in the right ventricle. Hepatobiliary: Cholecystectomy.  Otherwise unremarkable Pancreas: Unremarkable Spleen: Unremarkable Adrenals/Urinary Tract: Suspected 1-2 mm left mid kidney calculus on image 89/6. Suspected 1-2 mm right kidney lower pole nonobstructive calculus on image 33/5. No hydronephrosis, hydroureter, or ureteral calculus. No significant abnormal renal parenchymal enhancement. No appreciable abnormal enhancement or abnormal filling defect along the urothelium. The adrenal glands appear normal. Stomach/Bowel: Borderline dilated loops of proximal jejunum some demonstrating mild wall thickening. Mildly high position of the cecum on image 52/10. Mildly accentuated enhancement along the rectal wall for example on image 66/7, local inflammation is not excluded. Vascular/Lymphatic: Small periaortic lymph nodes are not pathologically enlarged by size criteria. No pathologic adenopathy is noted. Minimal abdominal aortic atherosclerotic calcification. Reproductive: There is some asymmetric enhancement in the peripheral zone of the right prostate gland compared to the left. Other: No supplemental non-categorized findings. Musculoskeletal: 4 mm sclerotic focus in the left iliac bone on image 50/7 is most likely a bone island or similar benign lesion, although strictly speaking is nonspecific. Degenerative facet arthropathy is observed bilaterally at L5-S1. Small lucent lesion in the left iliac bone on image 45/10 is likely benign. IMPRESSION: 1. No findings of adenopathy or compelling evidence of osseous  metastatic disease. 2. Questionable punctate single nonobstructive bilateral renal calculi. No other cause for hematuria is observed. 3. Asymmetric enhancement in the right peripheral zone of the prostate gland. 4. Borderline dilated loops of proximal jejunum with some questionable wall thickening. Questionable rectal wall thickening. Query low-grade enterocolitis, correlate with any change in bowel habits. 5. Electronically Signed   By: Van Clines M.D.   On: 12/28/2019 14:58   US SCROTUM W/DOPPLER  Result Date: 01/12/2020 CLINICAL DATA:  Scrotal pain. Recent surgery for prostatic carcinoma EXAM: SCROTAL ULTRASOUND DOPPLER ULTRASOUND OF THE TESTICLES TECHNIQUE: Complete ultrasound examination of the testicles, epididymis, and other scrotal structures was performed. Color and spectral Doppler ultrasound were also utilized to evaluate blood flow to the testicles. COMPARISON:  None. FINDINGS: Right testicle Measurements: 2.9 x 2.7 x 2.6 cm. There are scattered tiny calcifications in the right testis. A scrotolith measuring 3 mm is seen immediately adjacent to the right testis. There is an area of somewhat wedge-shaped decreased echogenicity in the periphery of the right testis which appears ill-defined. A well-defined mass is not seen in the right testis. Left testicle Measurements: 4.1 x  2.5 x 2.7 cm. There is slight microlithiasis. There is a somewhat ill-defined area of wedge-shaped decreased echogenicity in the periphery of the left testis which appears essentially identical to the right side. Well-defined mass not seen. Right epididymis: Normal in size and appearance. No evident hyperemia Left epididymis: Normal in size and appearance. No evident hyperemia Hydrocele: Small hydrocele on each side. A small amount of debris is noted in each hydrocele. Varicocele:  None visualized. Pulsed Doppler interrogation of both testes demonstrates normal low resistance arterial and venous waveforms bilaterally. No  scrotal wall thickening or scrotal abscess evident. IMPRESSION: 1. Testes appear symmetric bilaterally. There is decreased echogenicity in a somewhat wedge-shaped manner along the periphery of each testis, identical between left and right sides. This striking symmetry would be most unusual for mass. There is no abnormal vascularity in this area. Suspect anatomic variant. No evident orchitis or testicular torsion on either side. 2. 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 Current literature suggests that testicular microlithiasis is not a significant independent risk factor for development of testicular carcinoma, and that follow up imaging is not warranted in the absence of other risk factors. Monthly testicular self-examination and annual physical exams are considered appropriate surveillance. If patient has other risk factors for testicular carcinoma, then referral to Urology should be considered. (Reference: DeCastro, et al.: A 5-Year Follow up Study of Asymptomatic Men with Testicular Microlithiasis. J Urol 2008; [id number].) 3. Small scrotolith immediately adjacent to the right testis, a benign finding. 4. Small hydrocele on each side with mild debris in each hydrocele. This debris could be a result of recent surgery in the nearby prostatic region. Residua of hemorrhage or infection could present in this manner. There is no evidence of abscess. 5.  No demonstrable epididymitis or epididymal mass on either side. Electronically Signed   By: Lowella Grip III M.D.   On: 01/12/2020 09:38     Subjective: Patient was feeling better when seen today.  No new complaints.  He was eager to go home.  Discharge Exam: Vitals:   01/15/20 0737 01/15/20 0822  BP:  137/82  Pulse:  78  Resp:    Temp:  97.8 F (36.6 C)  SpO2: 99% 97%   Vitals:   01/14/20 1925 01/15/20 0100 01/15/20 0737 01/15/20 0822  BP: 134/81 130/78  137/82  Pulse: 89 86  78  Resp: 18 18    Temp: 98 F (36.7 C) 98 F (36.7  C)  97.8 F (36.6 C)  TempSrc: Oral Oral  Oral  SpO2: 100%  99% 97%  Weight:      Height:        General: Pt is alert, awake, not in acute distress Cardiovascular: RRR, S1/S2 +, no rubs, no gallops Respiratory: CTA bilaterally, no wheezing, no rhonchi Abdominal: Soft, NT, ND, bowel sounds + Extremities: no edema, no cyanosis   The results of significant diagnostics from this hospitalization (including imaging, microbiology, ancillary and laboratory) are listed below for reference.    Microbiology: Recent Results (from the past 240 hour(s))  Culture, blood (Routine x 2)     Status: None (Preliminary result)   Collection Time: 01/11/20 10:53 PM   Specimen: BLOOD  Result Value Ref Range Status   Specimen Description BLOOD LEFT HAND  Final   Special Requests   Final    BOTTLES DRAWN AEROBIC AND ANAEROBIC Blood Culture results may not be optimal due to an excessive volume of blood received in culture bottles   Culture   Final  NO GROWTH 4 DAYS Performed at [facility], [address] [place], [place] [address]    Report Status PENDING  Incomplete  Culture, blood (Routine x 2)     Status: None (Preliminary result)   Collection Time: 01/11/20 11:17 PM   Specimen: BLOOD  Result Value Ref Range Status   Specimen Description BLOOD LEFT WRIST  Final   Special Requests   Final    BOTTLES DRAWN AEROBIC AND ANAEROBIC Blood Culture results may not be optimal due to an inadequate volume of blood received in culture bottles   Culture   Final    NO GROWTH 4 DAYS Performed at [facility], [address] [place], [place] [address]    Report Status PENDING  Incomplete  Urine Culture     Status: Abnormal   Collection Time: 01/12/20 12:10 AM   Specimen: Urine, Random  Result Value Ref Range Status   Specimen Description   Final    URINE, RANDOM Performed at [facility], [address], [place], [place] [address]    Special Requests   Final     NONE Performed at [facility], [address] [place], [place] [address]    Culture >=100,000 COLONIES/mL ESCHERICHIA COLI (A)  Final   Report Status 01/13/2020 FINAL  Final   Organism ID, Bacteria ESCHERICHIA COLI (A)  Final      Susceptibility   Escherichia coli - MIC*    AMPICILLIN >=32 RESISTANT Resistant     CEFAZOLIN 16 SENSITIVE Sensitive     CEFTRIAXONE <=0.25 SENSITIVE Sensitive     CIPROFLOXACIN <=0.25 SENSITIVE Sensitive     GENTAMICIN <=1 SENSITIVE Sensitive     IMIPENEM <=0.25 SENSITIVE Sensitive     NITROFURANTOIN <=16 SENSITIVE Sensitive     TRIMETH/SULFA <=20 SENSITIVE Sensitive     AMPICILLIN/SULBACTAM >=32 RESISTANT Resistant     PIP/TAZO <=4 SENSITIVE Sensitive     * >=100,000 COLONIES/mL ESCHERICHIA COLI  Respiratory Panel by RT PCR (Flu A&B, Covid) - Nasopharyngeal Swab     Status: None   Collection Time: 01/12/20  1:49 AM   Specimen: Nasopharyngeal Swab  Result Value Ref Range Status   SARS Coronavirus 2 by RT PCR NEGATIVE NEGATIVE Final    Comment: (NOTE) SARS-CoV-2 target nucleic acids are NOT DETECTED. The SARS-CoV-2 RNA is generally detectable in upper respiratoy specimens during the acute phase of infection. The lowest concentration of SARS-CoV-2 viral copies this assay can detect is 131 copies/mL. A negative result does not preclude SARS-Cov-2 infection and should not be used as the sole basis for treatment or other patient management decisions. A negative result may occur with  improper specimen collection/handling, submission of specimen other than nasopharyngeal swab, presence of viral mutation(s) within the areas targeted by this assay, and inadequate number of viral copies (<131 copies/mL). A negative result must be combined with clinical observations, patient history, and epidemiological information. The expected result is Negative. Fact Sheet for Patients:  PinkCheek.be Fact Sheet for Healthcare  Providers:  GravelBags.it This test is not yet ap proved or cleared by the [place] FDA and  has been authorized for detection and/or diagnosis of SARS-CoV-2 by FDA under an Emergency Use Authorization (EUA). This EUA will remain  in effect (meaning this test can be used) for the duration of the COVID-19 declaration under Section 564(b)(1) of the Act, 21 U.S.C. section 360bbb-3(b)(1), unless the authorization is terminated or revoked sooner.    Influenza A by PCR NEGATIVE NEGATIVE Final  Influenza B by PCR NEGATIVE NEGATIVE Final    Comment: (NOTE) The Xpert Xpress SARS-CoV-2/FLU/RSV assay is intended as an aid in  the diagnosis of influenza from Nasopharyngeal swab specimens and  should not be used as a sole basis for treatment. Nasal washings and  aspirates are unacceptable for Xpert Xpress SARS-CoV-2/FLU/RSV  testing. Fact Sheet for Patients: PinkCheek.be Fact Sheet for Healthcare Providers: GravelBags.it This test is not yet approved or cleared by the [place] FDA and  has been authorized for detection and/or diagnosis of SARS-CoV-2 by  FDA under an Emergency Use Authorization (EUA). This EUA will remain  in effect (meaning this test can be used) for the duration of the  Covid-19 declaration under Section 564(b)(1) of the Act, 21  U.S.C. section 360bbb-3(b)(1), unless the authorization is  terminated or revoked. Performed at [facility], [address] [place], [place] [address]      Labs: BNP (last 3 results) No results for input(s): BNP in the last 8760 hours. Basic Metabolic Panel: Recent Labs  Lab 01/11/20 2253 01/12/20 0515 01/13/20 0535 01/14/20 0336  NA 134*  --  138 136  K 4.1  --  4.5 4.2  CL 102  --  107 107  CO2 25  --  27 23  GLUCOSE 337*  --  214* 317*  BUN 16  --  12 21*  CREATININE 0.91 0.85 0.96 0.95  CALCIUM 8.3*  --  8.2* 8.1*   Liver  Function Tests: Recent Labs  Lab 01/11/20 2253  AST 21  ALT 24  ALKPHOS 78  BILITOT 1.2  PROT 6.7  ALBUMIN 3.2*   No results for input(s): LIPASE, AMYLASE in the last 168 hours. No results for input(s): AMMONIA in the last 168 hours. CBC: Recent Labs  Lab 01/11/20 2253 01/12/20 0515 01/13/20 0535 01/14/20 0336 01/15/20 0532  WBC 16.1* 15.9* 16.3* 12.4* 8.3  NEUTROABS 12.3*  --   --   --   --   HGB 13.7 13.0 12.3* 12.1* 12.3*  HCT 39.5 39.1 37.6* 36.1* 36.4*  MCV 87.8 91.4 92.8 90.9 88.8  PLT 187 154 172 197 211   Cardiac Enzymes: No results for input(s): CKTOTAL, CKMB, CKMBINDEX, TROPONINI in the last 168 hours. BNP: Invalid input(s): POCBNP CBG: Recent Labs  Lab 01/14/20 0805 01/14/20 1135 01/14/20 1710 01/14/20 2105 01/15/20 0737  GLUCAP 324* 320* 73 270* 246*   D-Dimer No results for input(s): DDIMER in the last 72 hours. Hgb A1c No results for input(s): HGBA1C in the last 72 hours. Lipid Profile No results for input(s): CHOL, HDL, LDLCALC, TRIG, CHOLHDL, LDLDIRECT in the last 72 hours. Thyroid function studies No results for input(s): TSH, T4TOTAL, T3FREE, THYROIDAB in the last 72 hours.  Invalid input(s): FREET3 Anemia work up No results for input(s): VITAMINB12, FOLATE, FERRITIN, TIBC, IRON, RETICCTPCT in the last 72 hours. Urinalysis    Component Value Date/Time   COLORURINE AMBER (A) 01/12/2020 0010   APPEARANCEUR CLOUDY (A) 01/12/2020 0010   APPEARANCEUR Clear 12/27/2019 1546   LABSPEC 1.039 (H) 01/12/2020 0010   PHURINE 5.0 01/12/2020 0010   GLUCOSEU >=500 (A) 01/12/2020 0010   HGBUR LARGE (A) 01/12/2020 0010   BILIRUBINUR NEGATIVE 01/12/2020 0010   BILIRUBINUR Negative 12/27/2019 1546   KETONESUR 5 (A) 01/12/2020 0010   PROTEINUR 100 (A) 01/12/2020 0010   NITRITE NEGATIVE 01/12/2020 0010   LEUKOCYTESUR LARGE (A) 01/12/2020 0010   Sepsis Labs Invalid input(s): PROCALCITONIN,  WBC,  LACTICIDVEN Microbiology Recent Results (from the  past 240 hour(s))  Culture, blood (Routine x 2)     Status: None (Preliminary result)   Collection Time: 01/11/20 10:53 PM   Specimen: BLOOD  Result Value Ref Range Status   Specimen Description BLOOD LEFT HAND  Final   Special Requests   Final    BOTTLES DRAWN AEROBIC AND ANAEROBIC Blood Culture results may not be optimal due to an excessive volume of blood received in culture bottles   Culture   Final    NO GROWTH 4 DAYS Performed at [facility], [address], [place], [place] [address]    Report Status PENDING  Incomplete  Culture, blood (Routine x 2)     Status: None (Preliminary result)   Collection Time: 01/11/20 11:17 PM   Specimen: BLOOD  Result Value Ref Range Status   Specimen Description BLOOD LEFT WRIST  Final   Special Requests   Final    BOTTLES DRAWN AEROBIC AND ANAEROBIC Blood Culture results may not be optimal due to an inadequate volume of blood received in culture bottles   Culture   Final    NO GROWTH 4 DAYS Performed at [facility], [address] [place], [place] [address]    Report Status PENDING  Incomplete  Urine Culture     Status: Abnormal   Collection Time: 01/12/20 12:10 AM   Specimen: Urine, Random  Result Value Ref Range Status   Specimen Description   Final    URINE, RANDOM Performed at [facility], [address] [place], [place] [address]    Special Requests   Final    NONE Performed at [facility], [address] [place], [place] [address]    Culture >=100,000 COLONIES/mL ESCHERICHIA COLI (A)  Final   Report Status 01/13/2020 FINAL  Final   Organism ID, Bacteria ESCHERICHIA COLI (A)  Final      Susceptibility   Escherichia coli - MIC*    AMPICILLIN >=32 RESISTANT Resistant     CEFAZOLIN 16 SENSITIVE Sensitive     CEFTRIAXONE <=0.25 SENSITIVE Sensitive     CIPROFLOXACIN <=0.25 SENSITIVE Sensitive     GENTAMICIN <=1 SENSITIVE Sensitive     IMIPENEM <=0.25 SENSITIVE Sensitive      NITROFURANTOIN <=16 SENSITIVE Sensitive     TRIMETH/SULFA <=20 SENSITIVE Sensitive     AMPICILLIN/SULBACTAM >=32 RESISTANT Resistant     PIP/TAZO <=4 SENSITIVE Sensitive     * >=100,000 COLONIES/mL ESCHERICHIA COLI  Respiratory Panel by RT PCR (Flu A&B, Covid) - Nasopharyngeal Swab     Status: None   Collection Time: 01/12/20  1:49 AM   Specimen: Nasopharyngeal Swab  Result Value Ref Range Status   SARS Coronavirus 2 by RT PCR NEGATIVE NEGATIVE Final    Comment: (NOTE) SARS-CoV-2 target nucleic acids are NOT DETECTED. The SARS-CoV-2 RNA is generally detectable in upper respiratoy specimens during the acute phase of infection. The lowest concentration of SARS-CoV-2 viral copies this assay can detect is 131 copies/mL. A negative result does not preclude SARS-Cov-2 infection and should not be used as the sole basis for treatment or other patient management decisions. A negative result may occur with  improper specimen collection/handling, submission of specimen other than nasopharyngeal swab, presence of viral mutation(s) within the areas targeted by this assay, and inadequate number of viral copies (<131 copies/mL). A negative result must be combined with clinical observations, patient history, and epidemiological information. The expected result is Negative. Fact Sheet for Patients:  PinkCheek.be Fact Sheet for Healthcare Providers:  GravelBags.it This test is not yet ap proved or cleared by the [place] and  has been authorized for detection and/or diagnosis of SARS-CoV-2 by FDA under an Emergency Use Authorization (EUA). This EUA will remain  in effect (meaning this test can be used) for the duration of the COVID-19 declaration under Section 564(b)(1) of the Act, 21 U.S.C. section 360bbb-3(b)(1), unless the authorization is terminated or revoked sooner.    Influenza A by PCR NEGATIVE NEGATIVE Final   Influenza B  by PCR NEGATIVE NEGATIVE Final    Comment: (NOTE) The Xpert Xpress SARS-CoV-2/FLU/RSV assay is intended as an aid in  the diagnosis of influenza from Nasopharyngeal swab specimens and  should not be used as a sole basis for treatment. Nasal washings and  aspirates are unacceptable for Xpert Xpress SARS-CoV-2/FLU/RSV  testing. Fact Sheet for Patients: PinkCheek.be Fact Sheet for Healthcare Providers: GravelBags.it This test is not yet approved or cleared by the [place] FDA and  has been authorized for detection and/or diagnosis of SARS-CoV-2 by  FDA under an Emergency Use Authorization (EUA). This EUA will remain  in effect (meaning this test can be used) for the duration of the  Covid-19 declaration under Section 564(b)(1) of the Act, 21  U.S.C. section 360bbb-3(b)(1), unless the authorization is  terminated or revoked. Performed at [facility], [address] [place], [place] [address]     Time coordinating discharge: Over 30 minutes  SIGNED:  Lorella Nimrod, MD  [facility] 01/15/2020, 11:24 AM  If 7PM-7AM, please contact night-coverage [email or web address]  This record has been created using Systems analyst. Errors have been sought and corrected,but may not always be located. Such creation errors do not reflect on the standard of care.

## 2020-01-16 LAB — CULTURE, BLOOD (ROUTINE X 2)
Culture: NO GROWTH
Culture: NO GROWTH

## 2020-01-31 ENCOUNTER — Telehealth: Payer: Self-pay

## 2020-01-31 NOTE — Telephone Encounter (Signed)
Patient called saying he is still having blood in his urine, pain in his testicles, burning & pain with urination and still having to use a heating pad to find comfort following a radical prostatectomy with Dr. Erlene Quan on March 25.  He denies fever though he says he is still having to take tylenol. He has a follow up scheduled for Weds Apr 28 but didn't want to wait till then to be seen. Appt made with Sam on 4/23.

## 2020-02-01 ENCOUNTER — Other Ambulatory Visit: Payer: Self-pay | Admitting: Family Medicine

## 2020-02-01 ENCOUNTER — Encounter: Payer: Self-pay | Admitting: Physician Assistant

## 2020-02-01 ENCOUNTER — Other Ambulatory Visit: Payer: Self-pay

## 2020-02-01 ENCOUNTER — Other Ambulatory Visit
Admission: RE | Admit: 2020-02-01 | Discharge: 2020-02-01 | Disposition: A | Discharge status: Home / Self Care | Payer: PRIVATE HEALTH INSURANCE | Source: Ambulatory Visit | Attending: Physician Assistant | Admitting: Physician Assistant

## 2020-02-01 ENCOUNTER — Other Ambulatory Visit: Payer: Self-pay | Admitting: Physician Assistant

## 2020-02-01 ENCOUNTER — Ambulatory Visit (INDEPENDENT_AMBULATORY_CARE_PROVIDER_SITE_OTHER): Payer: PRIVATE HEALTH INSURANCE | Admitting: Physician Assistant

## 2020-02-01 VITALS — BP 119/78 | HR 98 | Temp 97.6°F | Ht 72.0 in | Wt 202.1 lb

## 2020-02-01 DIAGNOSIS — C61 Malignant neoplasm of prostate: Secondary | ICD-10-CM

## 2020-02-01 DIAGNOSIS — R300 Dysuria: Secondary | ICD-10-CM

## 2020-02-01 LAB — URINALYSIS, COMPLETE
Bilirubin, UA: NEGATIVE
Nitrite, UA: POSITIVE — AB
Specific Gravity, UA: >1.03 — ABNORMAL HIGH (ref 1.005–1.030)
Urobilinogen, Ur: 1 mg/dL (ref 0.2–1.0)
pH, UA: 5 (ref 5.0–7.5)

## 2020-02-01 LAB — CBC
HCT: 40.4 % (ref 39.0–52.0)
Hemoglobin: 14 g/dL (ref 13.0–17.0)
MCH: 30.3 pg (ref 26.0–34.0)
MCHC: 34.7 g/dL (ref 30.0–36.0)
MCV: 87.4 fL (ref 80.0–100.0)
Platelets: 192 10*3/uL (ref 150–400)
RBC: 4.62 MIL/uL (ref 4.22–5.81)
RDW: 12.1 % (ref 11.5–15.5)
WBC: 4.6 10*3/uL (ref 4.0–10.5)
nRBC: 0 % (ref 0.0–0.2)

## 2020-02-01 LAB — MICROSCOPIC EXAMINATION
RBC, Urine: >30 /hpf — AB (ref 0–2)
WBC, UA: >30 /hpf — AB (ref 0–5)

## 2020-02-01 LAB — PSA: Prostatic Specific Antigen: 0.02 ng/mL (ref 0.00–4.00)

## 2020-02-01 MED ORDER — SULFAMETHOXAZOLE-TRIMETHOPRIM 800-160 MG PO TABS: 1.0000 | ORAL_TABLET | Freq: Two times a day (BID) | ORAL | 0 refills | Status: AC

## 2020-02-01 NOTE — Progress Notes (Signed)
02/01/2020 2:49 PM   [name] [birth year] [id number]  CC: Dysuria, perineal pain  HPI: Joseph [name] is a 49 y.o. male with PMH poorly controlled diabetes and Gleason 3+4 prostate cancer s/p RALP on 01/03/2020 who subsequently developed febrile UTI following postop Foley removal who presents today for evaluation of possible UTI.  Patient was noted to have a left pelvic fluid collection on CT during his admission for febrile UTI, believed to be a normal postoperative finding and deemed to be nondrainable by IR.  Today, patient reports stable dysuria, gross hematuria, and perineal pain since his discharge from the hospital on 01/15/2020.  He has been taking Tylenol and using a heat pack for symptom relief and notes that heat pack is the only thing that relieves the perineal discomfort.  He denies fever, nausea, and vomiting.  He states he did have some chills several days ago that have since resolved.  In-office UA today positive for 2+ glucose, 3+ blood, 3+ protein, nitrites, and trace leukocyte esterase; urine microscopy with >30 WBCs/HPF, >30 RBCs/HPF, and many bacteria.  Stat CBC WNL today.  PMH: Past Medical History:  Diagnosis Date  . AICD (automatic cardioverter/defibrillator) present 2011  . Asthma   . Cancer [facility])    prostate  . CHF (congestive heart failure) ([place])   . Coronary artery disease   . Diabetes mellitus without complication ([place])   . Dyspnea   . ED (erectile dysfunction)   . GERD (gastroesophageal reflux disease)   . Hyperlipidemia   . Hypertension   . Ischemic cardiomyopathy   . LADA (latent autoimmune diabetes in adults), managed as type 1 ([place])   . Myocardial infarction [facility]) 2011   anterior MI at [place] s/p BMS to ostial/proximal LAD  . Neuromuscular disorder ([place])    problems in arms. finger tips have been numb/tingling. voltaren works  . Obstructive sleep apnea 2018   cpap. has not used lately. uses a breathe-right strip on nose  . Seizures ([place])    diabetes seizures (low blood sugar); last one 08/2018  . Systolic heart failure ([place])   . Vitamin D deficiency     Surgical History: Past Surgical History:  Procedure Laterality Date  . CARDIAC DEFIBRILLATOR PLACEMENT  08/30/2010  . CHOLECYSTECTOMY N/A 11/07/2017   Procedure: LAPAROSCOPIC CHOLECYSTECTOMY;  Surgeon: Herbert Pun, MD;  Location: [facility] ORS;  Service: General;  Laterality: N/A;  . CORONARY ANGIOPLASTY  2011   stent placed x 1  . CYSTOSCOPY N/A 01/03/2020   Procedure: CYSTOSCOPY FLEXIBLE;  Surgeon: Hollice Espy, MD;  Location: [facility] ORS;  Service: Urology;  Laterality: N/A;  . ESOPHAGOGASTRODUODENOSCOPY (EGD) WITH PROPOFOL N/A 03/20/2018   Procedure: ESOPHAGOGASTRODUODENOSCOPY (EGD) WITH PROPOFOL;  Surgeon: Manya Silvas, MD;  Location: [facility] ENDOSCOPY;  Service: Endoscopy;  Laterality: N/A;  . LEFT HEART CATH AND CORONARY ANGIOGRAPHY N/A 11/23/2016   Procedure: Left Heart Cath and Coronary Angiography;  Surgeon: Nelva Bush, MD;  Location: [place] [facility] CV LAB;  Service: Cardiovascular;  Laterality: N/A;  . PROSTATE BIOPSY  2019  . PROSTATE BIOPSY N/A 10/23/2019   Procedure: PROSTATE BIOPSY;  Surgeon: Abbie Sons, MD;  Location: [facility] ORS;  Service: Urology;  Laterality: N/A;  . ROBOT ASSISTED LAPAROSCOPIC RADICAL PROSTATECTOMY N/A 01/03/2020   Procedure: XI ROBOTIC ASSISTED LAPAROSCOPIC RADICAL PROSTATECTOMY;  Surgeon: Hollice Espy, MD;  Location: [facility] ORS;  Service: Urology;  Laterality: N/A;  . TRANSRECTAL ULTRASOUND N/A 10/23/2019   Procedure: TRANSRECTAL ULTRASOUND;  Surgeon: Abbie Sons, MD;  Location[facility]  ORS;  Service: Urology;  Laterality: N/A;  . WRIST SURGERY Right 2008    Home Medications:  Allergies as of 02/01/2020      Reactions   Vancomycin Shortness Of Breath   Lisinopril Rash      Medication List       Accurate as of February 01, 2020  2:49 PM. If you have any questions, ask your nurse or doctor.        acetaminophen 500 MG  tablet Commonly known as: TYLENOL Take 500 mg by mouth every 6 (six) hours as needed for fever.   albuterol 108 (90 Base) MCG/ACT inhaler Commonly known as: Proventil HFA Inhale 2 puffs into the lungs every 4 (four) hours as needed for wheezing or shortness of breath.   aspirin EC 81 MG tablet Take 81 mg by mouth every morning.   B-D ULTRAFINE III SHORT PEN 31G X 8 MM Misc Generic drug: Insulin Pen Needle USE 5 TIMES DAILY AS  DIRECTED   budesonide 1 MG/2ML nebulizer solution Commonly known as: PULMICORT Inhale 1 mg into the lungs daily as needed. Pt has not gotten Rx filled as of 12-31-19   carvedilol 25 MG tablet Commonly known as: COREG Take 1 tablet (25 mg total) by mouth 2 (two) times daily.   D3-1000 25 MCG (1000 UT) capsule Generic drug: Cholecalciferol Take 1,000 Units by mouth daily.   digoxin 0.125 MG tablet Commonly known as: LANOXIN Take 1 tablet (125 mcg total) by mouth daily.   docusate sodium 100 MG capsule Commonly known as: COLACE Take 1 capsule (100 mg total) by mouth 2 (two) times daily.   fexofenadine-pseudoephedrine 60-120 MG 12 hr tablet Commonly known as: ALLEGRA-D Take 1 tablet by mouth 2 (two) times daily. What changed:   when to take this  reasons to take this   fluticasone 50 MCG/ACT nasal spray Commonly known as: FLONASE Place 2 sprays into both nostrils daily as needed for allergies.   FreeStyle Libre 14 Day Reader Kerrin Mo Use 1 Device continuously as needed   [employer] 14 Day Sensor Misc Use 1 Device every 14 (fourteen) days   furosemide 20 MG tablet Commonly known as: LASIX Take 1 tablet (20 mg total) by mouth 2 (two) times daily.   GlucaGen Diagnostic 1 MG injection Generic drug: glucagon (human recombinant) Inject into the muscle.   glucagon 1 MG injection Inject into the muscle.   insulin aspart 100 UNIT/ML FlexPen Commonly known as: NOVOLOG Inject 18-20-22 u TID AC plus sliding scale for up to 75 units TDD     loratadine 10 MG tablet Commonly known as: CLARITIN Take 10 mg by mouth daily as needed.   losartan 100 MG tablet Commonly known as: COZAAR Take 1 tablet (100 mg total) by mouth at bedtime.   montelukast 10 MG tablet Commonly known as: SINGULAIR Take by mouth.   naproxen sodium 220 MG tablet Commonly known as: ALEVE Take 220 mg by mouth daily as needed.   oxybutynin 5 MG tablet Commonly known as: DITROPAN Take 1 tablet (5 mg total) by mouth every 8 (eight) hours as needed for bladder spasms.   oxyCODONE-acetaminophen 5-325 MG tablet Commonly known as: PERCOCET/ROXICET Take 1-2 tablets by mouth every 6 (six) hours as needed for moderate pain.   pantoprazole 40 MG tablet Commonly known as: PROTONIX Take 1 tablet (40 mg total) by mouth daily. What changed:   when to take this  reasons to take this   spironolactone 25 MG tablet Commonly known  as: ALDACTONE Take 0.5 tablets (12.5 mg total) by mouth daily.   Toujeo Max SoloStar 300 UNIT/ML Solostar Pen Generic drug: insulin glargine (2 Unit Dial) Inject 50 Units into the skin daily.   Tyler Aas FlexTouch 200 UNIT/ML FlexTouch Pen Generic drug: insulin degludec Inject into the skin.       Allergies:  Allergies  Allergen Reactions  . Vancomycin Shortness Of Breath  . Lisinopril Rash    Family History: Family History  Problem Relation Age of Onset  . Hypertension Mother   . Heart attack Paternal Grandmother   . Heart attack Father   . Bladder Cancer Neg Hx   . Kidney cancer Neg Hx   . Prostate cancer Neg Hx     Social History:   reports that he has never smoked. He has never used smokeless tobacco. He reports that he does not drink alcohol or use drugs.  Physical Exam: BP 119/78 (BP Location: Left Arm, Patient Position: Standing, Cuff Size: Normal)   Pulse 98   Temp 97.6 F (36.4 C) (Other (Comment))   Ht 6' (1.829 m)   Wt 202 lb 1.6 oz (91.7 kg)   BMI 27.41 kg/m   Constitutional:  Alert and  oriented, no acute distress, nontoxic appearing HEENT: [place], AT Cardiovascular: No clubbing, cyanosis, or edema Respiratory: Normal respiratory effort, no increased work of breathing GI: Abdomen is soft, nontender, nondistended, no abdominal masses; surgical incisions well-healed and intact GU: Mild perineal tenderness without induration, crepitus, warmth, or purulence Skin: No rashes, bruises or suspicious lesions Neurologic: Grossly intact, no focal deficits, moving all 4 extremities Psychiatric: Normal mood and affect  Laboratory Data:  Lab Results  Component Value Date   WBC 4.6 02/01/2020   HGB 14.0 02/01/2020   HCT 40.4 02/01/2020   MCV 87.4 02/01/2020   PLT 192 02/01/2020   Results for orders placed or performed in visit on 02/01/20  Microscopic Examination   URINE  Result Value Ref Range   WBC, UA >30 (A) 0 - 5 /hpf   RBC >30 (A) 0 - 2 /hpf   Epithelial Cells (non renal) 0-10 0 - 10 /hpf   Casts Present (A) None seen /lpf   Cast Type Granular casts (A) N/A   Bacteria, UA Many (A) None seen/Few  Urinalysis, Complete  Result Value Ref Range   Specific Gravity, UA >1.030 (H) 1.005 - 1.030   pH, UA 5.0 5.0 - 7.5   Color, UA Brown (A) Yellow   Appearance Ur Cloudy (A) Clear   Leukocytes,UA Trace (A) Negative   Protein,UA 3+ (A) Negative/Trace   Glucose, UA 2+ (A) Negative   Ketones, UA Trace (A) Negative   RBC, UA 3+ (A) Negative   Bilirubin, UA Negative Negative   Urobilinogen, Ur 1.0 0.2 - 1.0 mg/dL   Nitrite, UA Positive (A) Negative   Microscopic Examination See below:    Assessment & Plan:   1. Dysuria 49 year old male s/p RALP for management of Gleason 3+4 prostate cancer who developed postop febrile UTI, here with constant perineal discomfort and dysuria.  UA grossly infected today, however CBC is reassuring for pelvic abscess.  Sending urine for culture and starting patient on empiric Bactrim DS twice daily x10 days.  He is scheduled for follow-up with Dr.  Erlene Quan next Wednesday.  Counseled him to keep this appointment.  He expressed understanding. - Urinalysis, Complete - sulfamethoxazole-trimethoprim (BACTRIM DS) 800-160 MG tablet; Take 1 tablet by mouth 2 (two) times daily for 10 days.  Dispense:  20 tablet; Refill: 0 - CULTURE, URINE COMPREHENSIVE   Return for Keep follow up next week .  Debroah Loop, PA-C  [facility] [address] [place], [place] [address] [phone]

## 2020-02-04 ENCOUNTER — Other Ambulatory Visit: Payer: PRIVATE HEALTH INSURANCE

## 2020-02-04 LAB — CULTURE, URINE COMPREHENSIVE

## 2020-02-05 NOTE — Progress Notes (Signed)
02/06/20 12:55 PM   Joseph [name] 19-May-1971 [id number]  Referring provider: Center, [facility] [place],  [place] [address] Chief Complaint  Patient presents with  . Follow-up    HPI: Joseph [name] is a 49 y.o. M who returns today for 6 week f/u s/p prostatectomy.   Please [name] Dr. Dagoberto Reef notes for details. PSA of 9.5 and 4K with an 8% probability of Gleason 7 or greater prostate cancer.  The pathology result was discussed in detail. 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 3/12+ for Gleason 3+3 and 2/12+ for Gleason 3+4.  He had a XI robotic assisted laparoscopic radical prostatectomy on 01/03/20. His surgical pathology 11/07/19 gleason 3+4 with less than 5% of a tertiary pattern -EPE focal -urinary bladder neck -SV  +perinerual invasion +margin (right apical, left apical) - lymph nodes pT2 pN0.   PSA 0.02 as of 02/01/20.   He was seen last week for UTI by [facility]. Please [name] her notes for details. He was treated w/ antibiotics for E. coli. Readmitted for UTI after catheter removal.   He reports of feeling constant pelvic pain at the moment w/o relief after using antibiotics. He only gets relief w/ heat pads. Post-op wise he has no problems however does not know what is attributing to his symptoms. He noticed chills and aches last week when he was going to sleep which was relieved w/ Tylenol. He reports of loose stool sometimes.   He has noticed improvement in his stream and only leaks when he coughs. Soreness (abd) post-surgery resolved.   Need to extend disability to another 2 weeks.   PMH: Past Medical History:  Diagnosis Date  . AICD (automatic cardioverter/defibrillator) present 2011  . Asthma   . Cancer [facility])    prostate  . CHF (congestive heart failure) ([place])   . Coronary artery disease   . Diabetes mellitus without complication ([place])   . Dyspnea   . ED (erectile dysfunction)   . GERD (gastroesophageal  reflux disease)   . Hyperlipidemia   . Hypertension   . Ischemic cardiomyopathy   . LADA (latent autoimmune diabetes in adults), managed as type 1 ([place])   . Myocardial infarction [facility]) 2011   anterior MI at [place] s/p BMS to ostial/proximal LAD  . Neuromuscular disorder ([place])    problems in arms. finger tips have been numb/tingling. voltaren works  . Obstructive sleep apnea 2018   cpap. has not used lately. uses a breathe-right strip on nose  . Seizures ([place])    diabetes seizures (low blood sugar); last one 08/2018  . Systolic heart failure ([place])   . Vitamin D deficiency     Surgical History: Past Surgical History:  Procedure Laterality Date  . CARDIAC DEFIBRILLATOR PLACEMENT  08/30/2010  . CHOLECYSTECTOMY N/A 11/07/2017   Procedure: LAPAROSCOPIC CHOLECYSTECTOMY;  Surgeon: Herbert Pun, MD;  Location: [facility] ORS;  Service: General;  Laterality: N/A;  . CORONARY ANGIOPLASTY  2011   stent placed x 1  . CYSTOSCOPY N/A 01/03/2020   Procedure: CYSTOSCOPY FLEXIBLE;  Surgeon: Hollice Espy, MD;  Location: [facility] ORS;  Service: Urology;  Laterality: N/A;  . ESOPHAGOGASTRODUODENOSCOPY (EGD) WITH PROPOFOL N/A 03/20/2018   Procedure: ESOPHAGOGASTRODUODENOSCOPY (EGD) WITH PROPOFOL;  Surgeon: Manya Silvas, MD;  Location: [facility] ENDOSCOPY;  Service: Endoscopy;  Laterality: N/A;  . LEFT HEART CATH AND CORONARY ANGIOGRAPHY N/A 11/23/2016   Procedure: Left Heart Cath and Coronary Angiography;  Surgeon: Nelva Bush, MD;  Location:  [place] CV LAB;  Service: Cardiovascular;  Laterality: N/A;  . PROSTATE BIOPSY  2019  . PROSTATE BIOPSY N/A 10/23/2019   Procedure: PROSTATE BIOPSY;  Surgeon: Abbie Sons, MD;  Location: [facility] ORS;  Service: Urology;  Laterality: N/A;  . ROBOT ASSISTED LAPAROSCOPIC RADICAL PROSTATECTOMY N/A 01/03/2020   Procedure: XI ROBOTIC ASSISTED LAPAROSCOPIC RADICAL PROSTATECTOMY;  Surgeon: Hollice Espy, MD;  Location: [facility] ORS;  Service: Urology;  Laterality: N/A;    . TRANSRECTAL ULTRASOUND N/A 10/23/2019   Procedure: TRANSRECTAL ULTRASOUND;  Surgeon: Abbie Sons, MD;  Location: [facility] ORS;  Service: Urology;  Laterality: N/A;  . WRIST SURGERY Right 2008    Home Medications:  Allergies as of 02/06/2020      Reactions   Vancomycin Shortness Of Breath   Lisinopril Rash      Medication List       Accurate as of February 06, 2020 11:59 PM. If you have any questions, ask your nurse or doctor.        acetaminophen 500 MG tablet Commonly known as: TYLENOL Take 500 mg by mouth every 6 (six) hours as needed for fever.   albuterol 108 (90 Base) MCG/ACT inhaler Commonly known as: Proventil HFA Inhale 2 puffs into the lungs every 4 (four) hours as needed for wheezing or shortness of breath.   aspirin EC 81 MG tablet Take 81 mg by mouth every morning.   B-D ULTRAFINE III SHORT PEN 31G X 8 MM Misc Generic drug: Insulin Pen Needle USE 5 TIMES DAILY AS  DIRECTED   budesonide 1 MG/2ML nebulizer solution Commonly known as: PULMICORT Inhale 1 mg into the lungs daily as needed. Pt has not gotten Rx filled as of 12-31-19   carvedilol 25 MG tablet Commonly known as: COREG Take 1 tablet (25 mg total) by mouth 2 (two) times daily.   D3-1000 25 MCG (1000 UT) capsule Generic drug: Cholecalciferol Take 1,000 Units by mouth daily.   digoxin 0.125 MG tablet Commonly known as: LANOXIN Take 1 tablet (125 mcg total) by mouth daily.   docusate sodium 100 MG capsule Commonly known as: COLACE Take 1 capsule (100 mg total) by mouth 2 (two) times daily.   fexofenadine-pseudoephedrine 60-120 MG 12 hr tablet Commonly known as: ALLEGRA-D Take 1 tablet by mouth 2 (two) times daily. What changed:   when to take this  reasons to take this   fluticasone 50 MCG/ACT nasal spray Commonly known as: FLONASE Place 2 sprays into both nostrils daily as needed for allergies.   FreeStyle Libre 14 Day Reader Kerrin Mo Use 1 Device continuously as needed   [employer] 14 Day Sensor Misc Use 1 Device every 14 (fourteen) days   furosemide 20 MG tablet Commonly known as: LASIX Take 1 tablet (20 mg total) by mouth 2 (two) times daily.   GlucaGen Diagnostic 1 MG injection Generic drug: glucagon (human recombinant) Inject into the muscle.   glucagon 1 MG injection Inject into the muscle.   insulin aspart 100 UNIT/ML FlexPen Commonly known as: NOVOLOG Inject 18-20-22 u TID AC plus sliding scale for up to 75 units TDD   loratadine 10 MG tablet Commonly known as: CLARITIN Take 10 mg by mouth daily as needed.   losartan 100 MG tablet Commonly known as: COZAAR Take 1 tablet (100 mg total) by mouth at bedtime.   montelukast 10 MG tablet Commonly known as: SINGULAIR Take by mouth.   naproxen sodium 220 MG tablet Commonly known as: ALEVE Take 220 mg by  mouth daily as needed.   oxybutynin 5 MG tablet Commonly known as: DITROPAN Take 1 tablet (5 mg total) by mouth every 8 (eight) hours as needed for bladder spasms.   oxyCODONE-acetaminophen 5-325 MG tablet Commonly known as: PERCOCET/ROXICET Take 1-2 tablets by mouth every 6 (six) hours as needed for moderate pain.   pantoprazole 40 MG tablet Commonly known as: PROTONIX Take 1 tablet (40 mg total) by mouth daily. What changed:   when to take this  reasons to take this   spironolactone 25 MG tablet Commonly known as: ALDACTONE Take 0.5 tablets (12.5 mg total) by mouth daily.   sulfamethoxazole-trimethoprim 800-160 MG tablet Commonly known as: BACTRIM DS Take 1 tablet by mouth 2 (two) times daily for 10 days.   Toujeo Max SoloStar 300 UNIT/ML Solostar Pen Generic drug: insulin glargine (2 Unit Dial) Inject 50 Units into the skin daily.   Tyler Aas FlexTouch 200 UNIT/ML FlexTouch Pen Generic drug: insulin degludec Inject into the skin.       Allergies:  Allergies  Allergen Reactions  . Vancomycin Shortness Of Breath  . Lisinopril Rash    Family History: Family History   Problem Relation Age of Onset  . Hypertension Mother   . Heart attack Paternal Grandmother   . Heart attack Father   . Bladder Cancer Neg Hx   . Kidney cancer Neg Hx   . Prostate cancer Neg Hx     Social History:  reports that he has never smoked. He has never used smokeless tobacco. He reports that he does not drink alcohol or use drugs.   Physical Exam: BP (!) 155/82   Pulse 94   Ht 6' (1.829 m)   Wt 202 lb (91.6 kg)   BMI 27.40 kg/m   Constitutional:  Alert and oriented, No acute distress. HEENT: [place] AT, moist mucus membranes.  Trachea midline, no masses. Cardiovascular: No clubbing, cyanosis, or edema. Respiratory: Normal respiratory effort, no increased work of breathing. GI: Abdomen is soft, nontender, nondistended, no abdominal masses. Incision well healed.  GU: No CVA tenderness.  Mild perineal tenderness.  Lymph: No cervical or inguinal lymphadenopathy. Skin: No rashes, bruises or suspicious lesions. Neurologic: Grossly intact, no focal deficits, moving all 4 extremities. Psychiatric: Normal mood and affect.  Laboratory Data:  Lab Results  Component Value Date   CREATININE 0.95 01/14/2020    Lab Results  Component Value Date   HGBA1C 10.5 (H) 01/03/2020   Pertinent Imaging:  Results for orders placed during the hospital encounter of 12/28/19  CT HEMATURIA WORKUP   Narrative CLINICAL DATA:  Microscopic hematuria. Stage IIa prostate adenocarcinoma. Mild testicular pain.  EXAM: CT ABDOMEN AND PELVIS WITHOUT AND WITH CONTRAST  TECHNIQUE: Multidetector CT imaging of the abdomen and pelvis was performed following the standard protocol before and following the bolus administration of intravenous contrast.  CONTRAST:  165mL OMNIPAQUE IOHEXOL 300 MG/ML  SOLN  COMPARISON:  None.  FINDINGS: Lower chest: Defibrillator lead noted projecting in the right ventricle.  Hepatobiliary: Cholecystectomy.  Otherwise unremarkable  Pancreas: Unremarkable  Spleen:  Unremarkable  Adrenals/Urinary Tract: Suspected 1-2 mm left mid kidney calculus on image 89/6. Suspected 1-2 mm right kidney lower pole nonobstructive calculus on image 33/5. No hydronephrosis, hydroureter, or ureteral calculus. No significant abnormal renal parenchymal enhancement. No appreciable abnormal enhancement or abnormal filling defect along the urothelium.  The adrenal glands appear normal.  Stomach/Bowel: Borderline dilated loops of proximal jejunum some demonstrating mild wall thickening. Mildly high position of the cecum on image  52/10. Mildly accentuated enhancement along the rectal wall for example on image 66/7, local inflammation is not excluded.  Vascular/Lymphatic: Small periaortic lymph nodes are not pathologically enlarged by size criteria. No pathologic adenopathy is noted. Minimal abdominal aortic atherosclerotic calcification.  Reproductive: There is some asymmetric enhancement in the peripheral zone of the right prostate gland compared to the left.  Other: No supplemental non-categorized findings.  Musculoskeletal: 4 mm sclerotic focus in the left iliac bone on image 50/7 is most likely a bone island or similar benign lesion, although strictly speaking is nonspecific. Degenerative facet arthropathy is observed bilaterally at L5-S1. Small lucent lesion in the left iliac bone on image 45/10 is likely benign.  IMPRESSION: 1. No findings of adenopathy or compelling evidence of osseous metastatic disease. 2. Questionable punctate single nonobstructive bilateral renal calculi. No other cause for hematuria is observed. 3. Asymmetric enhancement in the right peripheral zone of the prostate gland. 4. Borderline dilated loops of proximal jejunum with some questionable wall thickening. Questionable rectal wall thickening. Query low-grade enterocolitis, correlate with any change in bowel habits. 5.   Electronically Signed   By: Van Clines M.D.    On: 12/28/2019 14:58    I have personally reviewed the images and agree with radiologist interpretation.   Assessment & Plan:    1. Prostat cancer NED, PSA detectable however very low and likely drawn prematurely, repeat PSA in 3 months  Surgical path reviewed    2. Stress incontinence Cleared for full activities  Pt not interested in PT referral; will exercise by himself  Gradual improvement anticipated   3. 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 Pelvic pain  Scheduled CT A/P w/ contrast to r/o pelvic process although given lack of leukocytosis, drainage, fevers and other systemic signs of infection, unlikely  Need 2 week disability extension   Will call with CT results, 3 months with [place] [address] [place], [place] [address] [phone]  I, Lucas Mallow, am acting as a scribe for Dr. Hollice Espy,  I have reviewed the above documentation for accuracy and completeness, and I agree with the above.   Hollice Espy, MD

## 2020-02-06 ENCOUNTER — Encounter: Payer: Self-pay | Admitting: Urology

## 2020-02-06 ENCOUNTER — Ambulatory Visit (INDEPENDENT_AMBULATORY_CARE_PROVIDER_SITE_OTHER): Payer: PRIVATE HEALTH INSURANCE | Admitting: Urology

## 2020-02-06 ENCOUNTER — Other Ambulatory Visit: Payer: Self-pay

## 2020-02-06 VITALS — BP 155/82 | HR 94 | Ht 72.0 in | Wt 202.0 lb

## 2020-02-06 DIAGNOSIS — R1084 Generalized abdominal pain: Secondary | ICD-10-CM

## 2020-02-06 DIAGNOSIS — C61 Malignant neoplasm of prostate: Secondary | ICD-10-CM

## 2020-02-20 ENCOUNTER — Ambulatory Visit
Admission: RE | Admit: 2020-02-20 | Discharge: 2020-02-20 | Disposition: A | Discharge status: Home / Self Care | Payer: PRIVATE HEALTH INSURANCE | Source: Ambulatory Visit | Attending: Urology | Admitting: Urology

## 2020-02-20 ENCOUNTER — Other Ambulatory Visit: Payer: Self-pay

## 2020-02-20 DIAGNOSIS — R1084 Generalized abdominal pain: Secondary | ICD-10-CM | POA: Insufficient documentation

## 2020-02-20 LAB — POCT I-STAT CREATININE: Creatinine, Ser: 1 mg/dL (ref 0.61–1.24)

## 2020-02-20 MED ORDER — IOHEXOL 300 MG/ML  SOLN
100.0000 mL | Freq: Once | INTRAMUSCULAR | Status: AC | PRN
  Administered 2020-02-20: 100 mL via INTRAVENOUS

## 2020-02-28 ENCOUNTER — Encounter: Payer: Self-pay | Admitting: *Deleted

## 2020-04-05 ENCOUNTER — Encounter: Payer: Self-pay | Admitting: Emergency Medicine

## 2020-04-05 ENCOUNTER — Emergency Department
Admission: EM | Admit: 2020-04-05 | Discharge: 2020-04-06 | Disposition: A | Discharge status: Home / Self Care | Payer: PRIVATE HEALTH INSURANCE | Attending: Emergency Medicine | Admitting: Emergency Medicine

## 2020-04-05 ENCOUNTER — Other Ambulatory Visit: Payer: Self-pay

## 2020-04-05 ENCOUNTER — Emergency Department: Payer: PRIVATE HEALTH INSURANCE

## 2020-04-05 DIAGNOSIS — Z794 Long term (current) use of insulin: Secondary | ICD-10-CM | POA: Insufficient documentation

## 2020-04-05 DIAGNOSIS — E11621 Type 2 diabetes mellitus with foot ulcer: Secondary | ICD-10-CM | POA: Insufficient documentation

## 2020-04-05 DIAGNOSIS — I10 Essential (primary) hypertension: Secondary | ICD-10-CM | POA: Insufficient documentation

## 2020-04-05 DIAGNOSIS — Z7982 Long term (current) use of aspirin: Secondary | ICD-10-CM | POA: Insufficient documentation

## 2020-04-05 DIAGNOSIS — Z881 Allergy status to other antibiotic agents status: Secondary | ICD-10-CM | POA: Diagnosis not present

## 2020-04-05 DIAGNOSIS — E1110 Type 2 diabetes mellitus with ketoacidosis without coma: Secondary | ICD-10-CM | POA: Insufficient documentation

## 2020-04-05 DIAGNOSIS — I2510 Atherosclerotic heart disease of native coronary artery without angina pectoris: Secondary | ICD-10-CM | POA: Insufficient documentation

## 2020-04-05 DIAGNOSIS — L97419 Non-pressure chronic ulcer of right heel and midfoot with unspecified severity: Secondary | ICD-10-CM | POA: Insufficient documentation

## 2020-04-05 DIAGNOSIS — C61 Malignant neoplasm of prostate: Secondary | ICD-10-CM | POA: Insufficient documentation

## 2020-04-05 DIAGNOSIS — Z79899 Other long term (current) drug therapy: Secondary | ICD-10-CM | POA: Diagnosis not present

## 2020-04-05 LAB — COMPREHENSIVE METABOLIC PANEL
ALT: 21 U/L (ref 0–44)
AST: 21 U/L (ref 15–41)
Albumin: 3.3 g/dL — ABNORMAL LOW (ref 3.5–5.0)
Alkaline Phosphatase: 101 U/L (ref 38–126)
Anion gap: 7 (ref 5–15)
BUN: 22 mg/dL — ABNORMAL HIGH (ref 6–20)
CO2: 24 mmol/L (ref 22–32)
Calcium: 8.8 mg/dL — ABNORMAL LOW (ref 8.9–10.3)
Chloride: 105 mmol/L (ref 98–111)
Creatinine, Ser: 1.03 mg/dL (ref 0.61–1.24)
GFR calc Af Amer: >60 mL/min (ref >60)
GFR calc non Af Amer: >60 mL/min (ref >60)
Glucose, Bld: 318 mg/dL — ABNORMAL HIGH (ref 70–99)
Potassium: 4.4 mmol/L (ref 3.5–5.1)
Sodium: 136 mmol/L (ref 135–145)
Total Bilirubin: 0.9 mg/dL (ref 0.3–1.2)
Total Protein: 7 g/dL (ref 6.5–8.1)

## 2020-04-05 LAB — CBC
HCT: 41.2 % (ref 39.0–52.0)
Hemoglobin: 13.7 g/dL (ref 13.0–17.0)
MCH: 29.3 pg (ref 26.0–34.0)
MCHC: 33.3 g/dL (ref 30.0–36.0)
MCV: 88.2 fL (ref 80.0–100.0)
Platelets: 199 10*3/uL (ref 150–400)
RBC: 4.67 MIL/uL (ref 4.22–5.81)
RDW: 12.6 % (ref 11.5–15.5)
WBC: 8.2 10*3/uL (ref 4.0–10.5)
nRBC: 0 % (ref 0.0–0.2)

## 2020-04-05 MED ORDER — CLINDAMYCIN HCL 300 MG PO CAPS
300.0000 mg | ORAL_CAPSULE | Freq: Three times a day (TID) | ORAL | 0 refills | Status: DC
Start: 2020-04-05 — End: 2020-07-14

## 2020-04-05 MED ORDER — CLINDAMYCIN PHOSPHATE 600 MG/50ML IV SOLN
600.0000 mg | Freq: Once | INTRAVENOUS | Status: AC
  Administered 2020-04-06: 600 mg via INTRAVENOUS
  Filled 2020-04-05: qty 50

## 2020-04-05 MED ORDER — OXYCODONE-ACETAMINOPHEN 5-325 MG PO TABS: 1.0000 | ORAL_TABLET | ORAL | 0 refills | Status: DC | PRN

## 2020-04-05 MED ORDER — MORPHINE SULFATE (PF) 4 MG/ML IV SOLN
4.0000 mg | Freq: Once | INTRAVENOUS | Status: AC
  Administered 2020-04-06: 4 mg via INTRAVENOUS
  Filled 2020-04-05: qty 1

## 2020-04-05 MED ORDER — SODIUM CHLORIDE 0.9 % IV BOLUS
1000.0000 mL | Freq: Once | INTRAVENOUS | Status: AC
  Administered 2020-04-06: 1000 mL via INTRAVENOUS

## 2020-04-05 NOTE — ED Triage Notes (Signed)
Pt to ER states he is diabetic and ran out of his meds for several days and has restarted them.  States noted wound to bottom of right foot with significant swelling to right foot.  Pt states BS at home has been around 250.

## 2020-04-05 NOTE — ED Notes (Signed)
Pt waiting in lobby watching tv.

## 2020-04-05 NOTE — ED Notes (Signed)
Pt up to restroom.

## 2020-04-05 NOTE — Discharge Instructions (Addendum)
1.  Take antibiotic as prescribed (Clindamycin 300 mg 3 times daily x10 days). 2.  You may take Ibuprofen as needed for pain; Percocet as needed for more severe pain. 3.  Keep wound clean and dry.  You may use crutches as needed to walk. 4.  Return to the ER for worsening symptoms, persistent vomiting, difficulty breathing or other concerns.

## 2020-04-05 NOTE — ED Provider Notes (Signed)
[facility] Emergency Department Provider Note   ____________________________________________   First MD Initiated Contact with Patient 04/05/20 2344     (approximate)  I have reviewed the triage vital signs and the nursing notes.   HISTORY  Chief Complaint Foot Swelling    HPI Joseph [name] is a 49 y.o. male who presents to the ED from home with a chief complaint of diabetic foot ulcer.  Patient has type 2 diabetes and states he ran of his medicines for several days.  States he has since been able to refill his medications and restart them.  Reports history of wound to sole of his right foot.  States the area has been swollen and tender over the past few days.  Additionally, patient dropped a small can of orange juice onto his right foot 3 days ago.  Denies fever, chills, cough, chest pain, shortness of breath, abdominal pain, nausea, vomiting, dysuria or diarrhea.       Past Medical History:  Diagnosis Date  . AICD (automatic cardioverter/defibrillator) present 2011  . Asthma   . [facility])    prostate  . CHF (congestive heart failure) ([place])   . Coronary artery disease   . Diabetes mellitus without complication ([place])   . Dyspnea   . ED (erectile dysfunction)   . GERD (gastroesophageal reflux disease)   . Hyperlipidemia   . Hypertension   . Ischemic cardiomyopathy   . LADA (latent autoimmune diabetes in adults), managed as type 1 ([place])   . Myocardial infarction [facility]) 2011   anterior MI at [place] s/p BMS to ostial/proximal LAD  . Neuromuscular disorder ([place])    problems in arms. finger tips have been numb/tingling. voltaren works  . Obstructive sleep apnea 2018   cpap. has not used lately. uses a breathe-right strip on nose  . Seizures ([place])    diabetes seizures (low blood sugar); last one 08/2018  . Systolic heart failure ([place])   . Vitamin D deficiency     Patient Active Problem List   Diagnosis Date Noted  . Cystitis   . Scrotal pain     . CAD (coronary artery disease) 01/12/2020  . Hyperglycemia due to type 2 diabetes mellitus ([place]) 01/12/2020  . S/P prostatectomy 01/12/2020  . Severe asthma without complication 62/22/9798  . UTI (urinary tract infection) 01/12/2020  . Possible Postprocedural intraabdominal abscess 01/12/2020  . Prostate cancer ([place]) 01/03/2020  . Preop cardiovascular exam 10/18/2019  . Abdominal bloating 04/12/2019  . Chronic diarrhea 04/12/2019  . Gastritis and duodenitis 04/12/2019  . 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 Elevated PSA 07/25/2017  . Erectile dysfunction associated with type 2 diabetes mellitus ([place]) 07/25/2017  . Hypertension 03/01/2017  . Obstructive sleep apnea on CPAP 03/01/2017  . Chronic systolic CHF (congestive heart failure) ([place]) 11/24/2016  . Shortness of breath   . Abnormal EKG   . DKA (diabetic ketoacidoses) ([place]) 04/18/2016  . CAP (community acquired pneumonia) 04/18/2016  . Accelerated hypertension 04/18/2016  . Abdominal pain 04/18/2016  . 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[place]) 08/19/2015  . Poorly controlled type 1 diabetes mellitus ([place]) 08/07/2015  . Required emergent intubation 03/07/2014  . LADA (latent autoimmune diabetes in adults), managed as type 1 ([place]) 09/15/2011  . GERD without esophagitis 08/18/2011  . Hyperlipemia 08/18/2011  . Implantable cardioverter-defibrillator (ICD) in situ 08/18/2011  . Ischemic cardiomyopathy 08/18/2011  . MI (myocardial infarction) ([place]) 03/11/2010    Past Surgical History:  Procedure Laterality Date  . CARDIAC DEFIBRILLATOR PLACEMENT  08/30/2010  .  CHOLECYSTECTOMY N/A 11/07/2017   Procedure: LAPAROSCOPIC CHOLECYSTECTOMY;  Surgeon: Herbert Pun, MD;  Location: [facility] ORS;  Service: General;  Laterality: N/A;  . CORONARY ANGIOPLASTY  2011   stent placed x 1  . CYSTOSCOPY N/A 01/03/2020   Procedure: CYSTOSCOPY FLEXIBLE;  Surgeon: Hollice Espy, MD;  Location: [facility] ORS;  Service: Urology;  Laterality: N/A;  .  ESOPHAGOGASTRODUODENOSCOPY (EGD) WITH PROPOFOL N/A 03/20/2018   Procedure: ESOPHAGOGASTRODUODENOSCOPY (EGD) WITH PROPOFOL;  Surgeon: Manya Silvas, MD;  Location: [facility] ENDOSCOPY;  Service: Endoscopy;  Laterality: N/A;  . LEFT HEART CATH AND CORONARY ANGIOGRAPHY N/A 11/23/2016   Procedure: Left Heart Cath and Coronary Angiography;  Surgeon: Nelva Bush, MD;  Location: [place] [facility];  Service: Cardiovascular;  Laterality: N/A;  . PROSTATE BIOPSY  2019  . PROSTATE BIOPSY N/A 10/23/2019   Procedure: PROSTATE BIOPSY;  Surgeon: Abbie Sons, MD;  Location: [facility] ORS;  Service: Urology;  Laterality: N/A;  . ROBOT ASSISTED LAPAROSCOPIC RADICAL PROSTATECTOMY N/A 01/03/2020   Procedure: XI ROBOTIC ASSISTED LAPAROSCOPIC RADICAL PROSTATECTOMY;  Surgeon: Hollice Espy, MD;  Location: [facility] ORS;  Service: Urology;  Laterality: N/A;  . TRANSRECTAL ULTRASOUND N/A 10/23/2019   Procedure: TRANSRECTAL ULTRASOUND;  Surgeon: Abbie Sons, MD;  Location: [facility] ORS;  Service: Urology;  Laterality: N/A;  . WRIST SURGERY Right 2008    Prior to Admission medications   Medication Sig Start Date End Date Taking? Authorizing Provider  acetaminophen (TYLENOL) 500 MG tablet Take 500 mg by mouth every 6 (six) hours as needed for fever.    [provider]  albuterol (PROVENTIL HFA) 108 (90 Base) MCG/ACT inhaler Inhale 2 puffs into the lungs every 4 (four) hours as needed for wheezing or shortness of breath. 10/08/18   Carrie Mew, MD  aspirin EC 81 MG tablet Take 81 mg by mouth every morning.    [provider]  budesonide (PULMICORT) 1 MG/2ML nebulizer solution Inhale 1 mg into the lungs daily as needed. Pt has not gotten Rx filled as of 12-31-19 11/27/19 11/26/20  [provider]  carvedilol (COREG) 25 MG tablet Take 1 tablet (25 mg total) by mouth 2 (two) times daily. 01/15/20   Lorella Nimrod, MD  Cholecalciferol (D3-1000) 1000 units capsule Take 1,000 Units by mouth daily.     [provider]  clindamycin (CLEOCIN) 300 MG capsule Take 1 capsule (300 mg total) by mouth 3 (three) times daily. 04/05/20   Paulette Blanch, MD  Continuous Blood Gluc Receiver (FREESTYLE LIBRE 14 DAY READER) DEVI Use 1 Device continuously as needed 02/07/18   [provider]  Continuous Blood Gluc Sensor (FREESTYLE LIBRE 14 DAY SENSOR) MISC Use 1 Device every 14 (fourteen) days 02/07/18   [provider]  digoxin (LANOXIN) 0.125 MG tablet Take 1 tablet (125 mcg total) by mouth daily. 01/15/20   Lorella Nimrod, MD  docusate sodium (COLACE) 100 MG capsule Take 1 capsule (100 mg total) by mouth 2 (two) times daily. 01/04/20   Vaillancourt, Samantha, PA-C  fexofenadine-pseudoephedrine (ALLEGRA-D) 60-120 MG 12 hr tablet Take 1 tablet by mouth 2 (two) times daily. Patient taking differently: Take 1 tablet by mouth as needed.  09/12/19   Sable Feil, PA-C  fluticasone (FLONASE) 50 MCG/ACT nasal spray Place 2 sprays into both nostrils daily as needed for allergies.  01/26/16   [provider]  furosemide (LASIX) 20 MG tablet Take 1 tablet (20 mg total) by mouth 2 (two) times daily. 12/01/19   Carrie Mew, MD  glucagon  1 MG injection Inject into the muscle. 02/07/18   [provider]  glucagon, human recombinant, (GLUCAGEN DIAGNOSTIC) 1 MG injection Inject into the muscle. 02/07/18   [provider]  insulin aspart (NOVOLOG) 100 UNIT/ML FlexPen Inject 18-20-22 u TID AC plus sliding scale for up to 75 units TDD 01/10/20   [provider]  insulin degludec (TRESIBA FLEXTOUCH) 200 UNIT/ML FlexTouch Pen Inject into the skin. 01/29/20   [provider]  insulin glargine, 2 Unit Dial, (TOUJEO MAX SOLOSTAR) 300 UNIT/ML Solostar Pen Inject 50 Units into the skin daily. 01/15/20   Lorella Nimrod, MD  Insulin Pen Needle (B-D ULTRAFINE III SHORT PEN) 31G X 8 MM MISC USE 5 TIMES DAILY AS  DIRECTED 12/01/19   Carrie Mew, MD  loratadine (CLARITIN) 10 MG  tablet Take 10 mg by mouth daily as needed.     [provider]  losartan (COZAAR) 100 MG tablet Take 1 tablet (100 mg total) by mouth at bedtime. 12/01/19   Carrie Mew, MD  montelukast (SINGULAIR) 10 MG tablet Take by mouth. 11/27/19 11/26/20  [provider]  naproxen sodium (ALEVE) 220 MG tablet Take 220 mg by mouth daily as needed.    [provider]  oxybutynin (DITROPAN) 5 MG tablet Take 1 tablet (5 mg total) by mouth every 8 (eight) hours as needed for bladder spasms. 01/04/20   Vaillancourt, Aldona Bar, PA-C  oxyCODONE-acetaminophen (PERCOCET/ROXICET) 5-325 MG tablet Take 1 tablet by mouth every 4 (four) hours as needed for severe pain. 04/05/20   Paulette Blanch, MD  pantoprazole (PROTONIX) 40 MG tablet Take 1 tablet (40 mg total) by mouth daily. Patient taking differently: Take 40 mg by mouth as needed.  04/21/16   [name], MD  spironolactone (ALDACTONE) 25 MG tablet Take 0.5 tablets (12.5 mg total) by mouth daily. 01/15/20   Lorella Nimrod, MD    Allergies Vancomycin and Lisinopril  Family History  Problem Relation Age of Onset  . Hypertension Mother   . Heart attack Paternal Grandmother   . Heart attack Father   . Bladder Cancer Neg Hx   . Kidney cancer Neg Hx   . Prostate cancer Neg Hx     Social History Social History   Tobacco Use  . Smoking status: Never Smoker  . Smokeless tobacco: Never Used  Vaping Use  . Vaping Use: Never used  Substance Use Topics  . Alcohol use: No  . Drug use: No    Review of Systems  Constitutional: No fever/chills Eyes: No visual changes. ENT: No sore throat. Cardiovascular: Denies chest pain. Respiratory: Denies shortness of breath. Gastrointestinal: No abdominal pain.  No nausea, no vomiting.  No diarrhea.  No constipation. Genitourinary: Negative for dysuria. Musculoskeletal: Positive for right foot wound.  Negative for back pain. Skin: Negative for rash. Neurological: Negative for  headaches, focal weakness or numbness.   ____________________________________________   PHYSICAL EXAM:  VITAL SIGNS: ED Triage Vitals [04/05/20 1818]  Enc Vitals Group     BP (!) 165/74     Pulse Rate (!) 108     Resp 18     Temp 98.7 F (37.1 C)     Temp Source Oral     SpO2 96 %     Weight 212 lb (96.2 kg)     Height 6\' 1"  (1.854 m)     Head Circumference      Peak Flow      Pain Score 10     Pain Loc  Pain Edu?      Excl. in [place]?     Constitutional: Alert and oriented. Well appearing and in no acute distress. Eyes: Conjunctivae are normal. PERRL. EOMI. Head: Atraumatic. Nose: No congestion/rhinnorhea. Mouth/Throat: Mucous membranes are moist.   Neck: No stridor.   Cardiovascular: Normal rate, regular rhythm. Grossly normal heart sounds.  Good peripheral circulation. Respiratory: Normal respiratory effort.  No retractions. Lungs CTAB. Gastrointestinal: Soft and nontender. No distention. No abdominal bruits. No CVA tenderness. Musculoskeletal: No lower extremity tenderness nor edema.  No joint effusions. Neurologic:  Normal speech and language. No gross focal neurologic deficits are appreciated. No gait instability. Skin:  Skin is warm, dry and intact. No rash noted. Psychiatric: Mood and affect are normal. Speech and behavior are normal.  ____________________________________________   LABS (all labs ordered are listed, but only abnormal results are displayed)  Labs Reviewed  COMPREHENSIVE METABOLIC PANEL - Abnormal; Notable for the following components:      Result Value   Glucose, Bld 318 (*)    BUN 22 (*)    Calcium 8.8 (*)    Albumin 3.3 (*)    All other components within normal limits  CULTURE, BLOOD (ROUTINE X 2)  CULTURE, BLOOD (ROUTINE X 2)  CBC  LACTIC ACID, PLASMA   ____________________________________________  EKG  None ____________________________________________  RADIOLOGY  ED MD interpretation: Plantar ulcer with soft tissue  swelling; no osteomyelitis  Official radiology report(s): DG Foot Complete Right  Result Date: 04/05/2020 CLINICAL DATA:  Wound infection. EXAM: RIGHT FOOT COMPLETE - 3+ VIEW COMPARISON:  None. FINDINGS: There is a plantar ulcer at the level of the forefoot. There is no radiopaque foreign body. There is no radiographic evidence for underlying osteomyelitis. There is surrounding soft tissue swelling. There is no acute displaced fracture or dislocation. IMPRESSION: Plantar ulcer at the level of the forefoot with surrounding soft tissue swelling. No radiographic evidence for osteomyelitis. Electronically Signed   By: Constance Holster M.D.   On: 04/05/2020 19:05    ____________________________________________   PROCEDURES  Procedure(s) performed (including Critical Care):  Procedures   ____________________________________________   INITIAL IMPRESSION / ASSESSMENT AND PLAN / ED COURSE  As part of my medical decision making, I reviewed the following data within the [place] notes reviewed and incorporated, Labs reviewed, Old chart reviewed, Radiograph reviewed, Notes from prior ED visits and [place] Controlled Substance Database     Joseph [name] was evaluated in Emergency Department on 04/06/2020 for the symptoms described in the history of present illness. He was evaluated in the context of the global COVID-19 pandemic, which necessitated consideration that the patient might be at risk for infection with the SARS-CoV-2 virus that causes COVID-19. Institutional protocols and algorithms that pertain to the evaluation of patients at risk for COVID-19 are in a state of rapid change based on information released by regulatory bodies including the CDC and federal and state organizations. These policies and algorithms were followed during the patient's care in the ED.    49 year old diabetic presenting with pain and swelling to his right foot.  Differential diagnosis includes  but is not limited to diabetic foot ulcer, cellulitis, sepsis, etc.  Laboratory results remarkable for hyperglycemia without elevation of anion gap.  X-ray imaging does not show osteomyelitis.  Will obtain blood cultures, lactic acid.  Initiate IV fluid hydration, IV morphine for pain, IV clindamycin for antibiotic.  Will reassess.  Clinical Course as of Apr 07 451  Sun Apr 06, 2020  0138 Lactic acid unremarkable.  Will discharge home on clindamycin, crutches and patient will follow up with his PCP.  Strict return precautions given.  Patient verbalizes understanding agrees with plan of care.   [JS]    Clinical Course User Index [JS] Paulette Blanch, MD     ____________________________________________   FINAL CLINICAL IMPRESSION(S) / ED DIAGNOSES  Final diagnoses:  Diabetic ulcer of right midfoot associated with type 2 diabetes mellitus, limited to breakdown of skin [facility])     ED Discharge Orders         Ordered    clindamycin (CLEOCIN) 300 MG capsule  3 times daily     Discontinue  Reprint     04/05/20 2353    oxyCODONE-acetaminophen (PERCOCET/ROXICET) 5-325 MG tablet  Every 4 hours PRN     Discontinue  Reprint     04/05/20 2353           Note:  This document was prepared using Dragon voice recognition software and may include unintentional dictation errors.   Paulette Blanch, MD 04/06/20 [phone]

## 2020-04-06 LAB — LACTIC ACID, PLASMA: Lactic Acid, Venous: 1.2 mmol/L (ref 0.5–1.9)

## 2020-04-11 LAB — CULTURE, BLOOD (ROUTINE X 2)
Culture: NO GROWTH
Culture: NO GROWTH
Special Requests: ADEQUATE
Special Requests: ADEQUATE

## 2020-05-07 ENCOUNTER — Other Ambulatory Visit: Payer: Self-pay

## 2020-05-08 ENCOUNTER — Ambulatory Visit: Payer: Self-pay | Admitting: Urology

## 2020-05-19 ENCOUNTER — Other Ambulatory Visit: Payer: Self-pay

## 2020-05-20 ENCOUNTER — Encounter: Payer: Self-pay | Admitting: Urology

## 2020-05-21 ENCOUNTER — Ambulatory Visit: Payer: Self-pay | Admitting: Urology

## 2020-06-05 ENCOUNTER — Other Ambulatory Visit: Payer: PRIVATE HEALTH INSURANCE

## 2020-06-05 ENCOUNTER — Encounter: Payer: Self-pay | Admitting: Urology

## 2020-06-09 NOTE — Progress Notes (Signed)
No show, see phone note

## 2020-06-11 ENCOUNTER — Ambulatory Visit (INDEPENDENT_AMBULATORY_CARE_PROVIDER_SITE_OTHER): Payer: PRIVATE HEALTH INSURANCE | Admitting: Urology

## 2020-06-11 ENCOUNTER — Encounter: Payer: Self-pay | Admitting: Urology

## 2020-06-11 DIAGNOSIS — C61 Malignant neoplasm of prostate: Secondary | ICD-10-CM

## 2020-06-12 ENCOUNTER — Telehealth: Payer: Self-pay | Admitting: Urology

## 2020-06-12 NOTE — Telephone Encounter (Signed)
Patient was a no-show today in clinic.  It is imperative that he at minimum get a PSA drawn.  Please continue to reach out to him by phone.  If he does not answer, please send him a certified letter.  Hollice Espy, MD

## 2020-07-04 NOTE — Telephone Encounter (Signed)
Mychart sent and letter mailed to patient

## 2020-07-14 ENCOUNTER — Other Ambulatory Visit: Payer: Self-pay

## 2020-07-14 ENCOUNTER — Inpatient Hospital Stay
Admission: EM | Admit: 2020-07-14 | Discharge: 2020-07-15 | DRG: 872 | Disposition: A | Discharge status: Home / Self Care | Payer: PRIVATE HEALTH INSURANCE | Attending: Hospitalist | Admitting: Hospitalist

## 2020-07-14 ENCOUNTER — Emergency Department: Payer: PRIVATE HEALTH INSURANCE

## 2020-07-14 ENCOUNTER — Encounter: Payer: Self-pay | Admitting: Emergency Medicine

## 2020-07-14 DIAGNOSIS — J45909 Unspecified asthma, uncomplicated: Secondary | ICD-10-CM | POA: Diagnosis present

## 2020-07-14 DIAGNOSIS — L03115 Cellulitis of right lower limb: Secondary | ICD-10-CM | POA: Diagnosis present

## 2020-07-14 DIAGNOSIS — R8271 Bacteriuria: Secondary | ICD-10-CM | POA: Diagnosis present

## 2020-07-14 DIAGNOSIS — G4733 Obstructive sleep apnea (adult) (pediatric): Secondary | ICD-10-CM | POA: Diagnosis present

## 2020-07-14 DIAGNOSIS — Z881 Allergy status to other antibiotic agents status: Secondary | ICD-10-CM

## 2020-07-14 DIAGNOSIS — Z7982 Long term (current) use of aspirin: Secondary | ICD-10-CM | POA: Diagnosis not present

## 2020-07-14 DIAGNOSIS — Z794 Long term (current) use of insulin: Secondary | ICD-10-CM

## 2020-07-14 DIAGNOSIS — Z9079 Acquired absence of other genital organ(s): Secondary | ICD-10-CM

## 2020-07-14 DIAGNOSIS — Z8249 Family history of ischemic heart disease and other diseases of the circulatory system: Secondary | ICD-10-CM

## 2020-07-14 DIAGNOSIS — I2510 Atherosclerotic heart disease of native coronary artery without angina pectoris: Secondary | ICD-10-CM | POA: Diagnosis present

## 2020-07-14 DIAGNOSIS — E10621 Type 1 diabetes mellitus with foot ulcer: Secondary | ICD-10-CM | POA: Diagnosis present

## 2020-07-14 DIAGNOSIS — A419 Sepsis, unspecified organism: Principal | ICD-10-CM | POA: Diagnosis present

## 2020-07-14 DIAGNOSIS — I5022 Chronic systolic (congestive) heart failure: Secondary | ICD-10-CM | POA: Diagnosis present

## 2020-07-14 DIAGNOSIS — Z79899 Other long term (current) drug therapy: Secondary | ICD-10-CM

## 2020-07-14 DIAGNOSIS — E1065 Type 1 diabetes mellitus with hyperglycemia: Secondary | ICD-10-CM | POA: Diagnosis present

## 2020-07-14 DIAGNOSIS — E1040 Type 1 diabetes mellitus with diabetic neuropathy, unspecified: Secondary | ICD-10-CM | POA: Diagnosis present

## 2020-07-14 DIAGNOSIS — Z95810 Presence of automatic (implantable) cardiac defibrillator: Secondary | ICD-10-CM

## 2020-07-14 DIAGNOSIS — I10 Essential (primary) hypertension: Secondary | ICD-10-CM | POA: Diagnosis present

## 2020-07-14 DIAGNOSIS — Z888 Allergy status to other drugs, medicaments and biological substances status: Secondary | ICD-10-CM

## 2020-07-14 DIAGNOSIS — I255 Ischemic cardiomyopathy: Secondary | ICD-10-CM | POA: Diagnosis present

## 2020-07-14 DIAGNOSIS — E785 Hyperlipidemia, unspecified: Secondary | ICD-10-CM | POA: Diagnosis present

## 2020-07-14 DIAGNOSIS — Z20822 Contact with and (suspected) exposure to covid-19: Secondary | ICD-10-CM | POA: Diagnosis present

## 2020-07-14 DIAGNOSIS — L97419 Non-pressure chronic ulcer of right heel and midfoot with unspecified severity: Secondary | ICD-10-CM | POA: Diagnosis present

## 2020-07-14 DIAGNOSIS — K219 Gastro-esophageal reflux disease without esophagitis: Secondary | ICD-10-CM | POA: Diagnosis present

## 2020-07-14 DIAGNOSIS — L0390 Cellulitis, unspecified: Secondary | ICD-10-CM

## 2020-07-14 DIAGNOSIS — L97509 Non-pressure chronic ulcer of other part of unspecified foot with unspecified severity: Secondary | ICD-10-CM

## 2020-07-14 DIAGNOSIS — E11621 Type 2 diabetes mellitus with foot ulcer: Secondary | ICD-10-CM

## 2020-07-14 DIAGNOSIS — I252 Old myocardial infarction: Secondary | ICD-10-CM | POA: Diagnosis not present

## 2020-07-14 DIAGNOSIS — Z8546 Personal history of malignant neoplasm of prostate: Secondary | ICD-10-CM

## 2020-07-14 DIAGNOSIS — R0602 Shortness of breath: Secondary | ICD-10-CM | POA: Diagnosis present

## 2020-07-14 DIAGNOSIS — R6520 Severe sepsis without septic shock: Secondary | ICD-10-CM | POA: Diagnosis present

## 2020-07-14 DIAGNOSIS — I110 Hypertensive heart disease with heart failure: Secondary | ICD-10-CM | POA: Diagnosis present

## 2020-07-14 LAB — COMPREHENSIVE METABOLIC PANEL
ALT: 27 U/L (ref 0–44)
AST: 22 U/L (ref 15–41)
Albumin: 3.8 g/dL (ref 3.5–5.0)
Alkaline Phosphatase: 103 U/L (ref 38–126)
Anion gap: 8 (ref 5–15)
BUN: 16 mg/dL (ref 6–20)
CO2: 26 mmol/L (ref 22–32)
Calcium: 9.5 mg/dL (ref 8.9–10.3)
Chloride: 102 mmol/L (ref 98–111)
Creatinine, Ser: 1.13 mg/dL (ref 0.61–1.24)
GFR calc Af Amer: >60 mL/min (ref >60)
GFR calc non Af Amer: >60 mL/min (ref >60)
Glucose, Bld: 278 mg/dL — ABNORMAL HIGH (ref 70–99)
Potassium: 4.3 mmol/L (ref 3.5–5.1)
Sodium: 136 mmol/L (ref 135–145)
Total Bilirubin: 0.8 mg/dL (ref 0.3–1.2)
Total Protein: 7.5 g/dL (ref 6.5–8.1)

## 2020-07-14 LAB — TROPONIN I (HIGH SENSITIVITY)
Troponin I (High Sensitivity): 10 ng/L (ref <18)
Troponin I (High Sensitivity): 12 ng/L (ref <18)

## 2020-07-14 LAB — RESPIRATORY PANEL BY RT PCR (FLU A&B, COVID)
Influenza A by PCR: NEGATIVE
Influenza B by PCR: NEGATIVE
SARS Coronavirus 2 by RT PCR: NEGATIVE

## 2020-07-14 LAB — URINALYSIS, COMPLETE (UACMP) WITH MICROSCOPIC
Bilirubin Urine: NEGATIVE
Glucose, UA: >=500 mg/dL — AB
Ketones, ur: NEGATIVE mg/dL
Nitrite: POSITIVE — AB
Protein, ur: 100 mg/dL — AB
Specific Gravity, Urine: 1.023 (ref 1.005–1.030)
Squamous Epithelial / HPF: NONE SEEN (ref 0–5)
WBC, UA: >50 WBC/hpf — ABNORMAL HIGH (ref 0–5)
pH: 5 (ref 5.0–8.0)

## 2020-07-14 LAB — CBC
HCT: 43.4 % (ref 39.0–52.0)
Hemoglobin: 15.2 g/dL (ref 13.0–17.0)
MCH: 30.2 pg (ref 26.0–34.0)
MCHC: 35 g/dL (ref 30.0–36.0)
MCV: 86.1 fL (ref 80.0–100.0)
Platelets: 165 10*3/uL (ref 150–400)
RBC: 5.04 MIL/uL (ref 4.22–5.81)
RDW: 12.7 % (ref 11.5–15.5)
WBC: 10.9 10*3/uL — ABNORMAL HIGH (ref 4.0–10.5)
nRBC: 0 % (ref 0.0–0.2)

## 2020-07-14 LAB — BRAIN NATRIURETIC PEPTIDE: B Natriuretic Peptide: 74.2 pg/mL (ref 0.0–100.0)

## 2020-07-14 LAB — LACTIC ACID, PLASMA
Lactic Acid, Venous: 2 mmol/L (ref 0.5–1.9)
Lactic Acid, Venous: 1.6 mmol/L (ref 0.5–1.9)

## 2020-07-14 LAB — SEDIMENTATION RATE: Sed Rate: 26 mm/hr — ABNORMAL HIGH (ref 0–15)

## 2020-07-14 LAB — GLUCOSE, CAPILLARY: Glucose-Capillary: 174 mg/dL — ABNORMAL HIGH (ref 70–99)

## 2020-07-14 LAB — PROTIME-INR
INR: 1 (ref 0.8–1.2)
Prothrombin Time: 12.7 seconds (ref 11.4–15.2)

## 2020-07-14 LAB — C-REACTIVE PROTEIN: CRP: 1.4 mg/dL — ABNORMAL HIGH (ref <1.0)

## 2020-07-14 LAB — APTT: aPTT: 28 seconds (ref 24–36)

## 2020-07-14 MED ORDER — CARVEDILOL 25 MG PO TABS
25.0000 mg | ORAL_TABLET | Freq: Two times a day (BID) | ORAL | Status: DC
  Administered 2020-07-14 – 2020-07-15 (×2): 25 mg via ORAL
  Filled 2020-07-14 (×2): qty 1

## 2020-07-14 MED ORDER — DIGOXIN 125 MCG PO TABS
125.0000 ug | ORAL_TABLET | Freq: Every day | ORAL | Status: DC
  Administered 2020-07-15: 125 ug via ORAL
  Filled 2020-07-14: qty 1

## 2020-07-14 MED ORDER — HYDROCODONE-ACETAMINOPHEN 5-325 MG PO TABS: 1.0000 | ORAL_TABLET | ORAL | Status: DC | PRN

## 2020-07-14 MED ORDER — OXYCODONE-ACETAMINOPHEN 5-325 MG PO TABS
1.0000 | ORAL_TABLET | ORAL | Status: DC | PRN
  Filled 2020-07-14: qty 1

## 2020-07-14 MED ORDER — INSULIN ASPART 100 UNIT/ML ~~LOC~~ SOLN: 0.0000 [IU] | Freq: Every day | SUBCUTANEOUS | Status: DC

## 2020-07-14 MED ORDER — LACTATED RINGERS IV SOLN: INTRAVENOUS | Status: DC

## 2020-07-14 MED ORDER — INSULIN ASPART 100 UNIT/ML ~~LOC~~ SOLN
0.0000 [IU] | Freq: Three times a day (TID) | SUBCUTANEOUS | Status: DC
  Administered 2020-07-15: 15 [IU] via SUBCUTANEOUS
  Administered 2020-07-15: 2 [IU] via SUBCUTANEOUS
  Filled 2020-07-14 (×2): qty 1

## 2020-07-14 MED ORDER — SODIUM CHLORIDE 0.9 % IV SOLN
1.0000 g | INTRAVENOUS | Status: DC
  Administered 2020-07-15: 1 g via INTRAVENOUS
  Filled 2020-07-14: qty 10

## 2020-07-14 MED ORDER — ENOXAPARIN SODIUM 40 MG/0.4ML ~~LOC~~ SOLN
40.0000 mg | SUBCUTANEOUS | Status: DC
  Administered 2020-07-14: 40 mg via SUBCUTANEOUS
  Filled 2020-07-14: qty 0.4

## 2020-07-14 MED ORDER — SENNOSIDES-DOCUSATE SODIUM 8.6-50 MG PO TABS: 1.0000 | ORAL_TABLET | Freq: Every evening | ORAL | Status: DC | PRN

## 2020-07-14 MED ORDER — ONDANSETRON HCL 4 MG/2ML IJ SOLN: 4.0000 mg | Freq: Four times a day (QID) | INTRAMUSCULAR | Status: DC | PRN

## 2020-07-14 MED ORDER — SPIRONOLACTONE 25 MG PO TABS
12.5000 mg | ORAL_TABLET | Freq: Every day | ORAL | Status: DC
  Administered 2020-07-15: 12.5 mg via ORAL
  Filled 2020-07-14: qty 0.5

## 2020-07-14 MED ORDER — PIPERACILLIN-TAZOBACTAM 3.375 G IVPB 30 MIN
3.3750 g | Freq: Once | INTRAVENOUS | Status: AC
  Administered 2020-07-14: 3.375 g via INTRAVENOUS
  Filled 2020-07-14: qty 50

## 2020-07-14 MED ORDER — ACETAMINOPHEN 650 MG RE SUPP: 650.0000 mg | Freq: Four times a day (QID) | RECTAL | Status: DC | PRN

## 2020-07-14 MED ORDER — ONDANSETRON HCL 4 MG PO TABS: 4.0000 mg | ORAL_TABLET | Freq: Four times a day (QID) | ORAL | Status: DC | PRN

## 2020-07-14 MED ORDER — ACETAMINOPHEN 325 MG PO TABS
650.0000 mg | ORAL_TABLET | Freq: Four times a day (QID) | ORAL | Status: DC | PRN
  Administered 2020-07-14: 650 mg via ORAL
  Filled 2020-07-14: qty 2

## 2020-07-14 MED ORDER — FUROSEMIDE 40 MG PO TABS
20.0000 mg | ORAL_TABLET | Freq: Two times a day (BID) | ORAL | Status: DC
  Administered 2020-07-15: 20 mg via ORAL
  Filled 2020-07-14: qty 1

## 2020-07-14 MED ORDER — LOSARTAN POTASSIUM 50 MG PO TABS
100.0000 mg | ORAL_TABLET | Freq: Every day | ORAL | Status: DC
  Administered 2020-07-14: 100 mg via ORAL
  Filled 2020-07-14: qty 2

## 2020-07-14 NOTE — Progress Notes (Signed)
CODE SEPSIS - PHARMACY COMMUNICATION  **Broad Spectrum Antibiotics should be administered within 1 hour of Sepsis diagnosis**  Time Code Sepsis Called/Page Received:   10/4 @ 2235   Antibiotics Ordered: Zosyn 3.375 gm IV X 1   Time of 1st antibiotic administration: 10/4 @ 2247   Additional action taken by pharmacy:   If necessary, Name of Provider/Nurse Contacted:     Jamelia Varano D ,PharmD Clinical Pharmacist  07/14/2020  11:24 PM

## 2020-07-14 NOTE — ED Notes (Signed)
Pt reports chills and hot flashes with 10/10 pain in R foot at site of ulcer. Hx of diabetes. Gold ball sized ulcer noted with redness and warmth. Son at bedside intermittently. AO x4. Sitting at nurses' station. Talking in full sentences with regular and unlabored breathing.

## 2020-07-14 NOTE — ED Provider Notes (Signed)
[facility] Emergency Department Provider Note   ____________________________________________   First MD Initiated Contact with Patient 07/14/20 2007     (approximate)  I have reviewed the triage vital signs and the nursing notes.   HISTORY  Chief Complaint Chills, Shortness of Breath, and Foot Ulcer    HPI KEYLEN ECKENRODE is a 49 y.o. male patient comes in being out of his carvedilol dig potassium and Lasix for several days.  He also says he is gotten a fever and shortness of breath with dyspnea on exertion.  He developed a ulcer in the bottom of his right foot has been going on for several days now it is increasingly painful with his whole leg being sore and painful especially in the groin and is aching all over and has a fever.  He is not coughing.   Patient is diabetic.     Past Medical History:  Diagnosis Date  . AICD (automatic cardioverter/defibrillator) present 2011  . Asthma   . Cancer [facility])    prostate  . CHF (congestive heart failure) ([place])   . Coronary artery disease   . Diabetes mellitus without complication ([place])   . Dyspnea   . ED (erectile dysfunction)   . GERD (gastroesophageal reflux disease)   . Hyperlipidemia   . Hypertension   . Ischemic cardiomyopathy   . LADA (latent autoimmune diabetes in adults), managed as type 1 ([place])   . Myocardial infarction [facility]) 2011   anterior MI at [place] s/p BMS to ostial/proximal LAD  . Neuromuscular disorder ([place])    problems in arms. finger tips have been numb/tingling. voltaren works  . Obstructive sleep apnea 2018   cpap. has not used lately. uses a breathe-right strip on nose  . Seizures ([place])    diabetes seizures (low blood sugar); last one 08/2018  . Systolic heart failure ([place])   . Vitamin D deficiency     Patient Active Problem List   Diagnosis Date Noted  . Cystitis   . Scrotal pain   . CAD (coronary artery disease) 01/12/2020  . Hyperglycemia due to type 2 diabetes mellitus  ([place]) 01/12/2020  . S/P prostatectomy 01/12/2020  . Severe asthma without complication 37/62/8315  . UTI (urinary tract infection) 01/12/2020  . Possible Postprocedural intraabdominal abscess 01/12/2020  . Prostate cancer ([place]) 01/03/2020  . Preop cardiovascular exam 10/18/2019  . Abdominal bloating 04/12/2019  . Chronic diarrhea 04/12/2019  . Gastritis and duodenitis 04/12/2019  . 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 Elevated PSA 07/25/2017  . Erectile dysfunction associated with type 2 diabetes mellitus ([place]) 07/25/2017  . Hypertension 03/01/2017  . Obstructive sleep apnea on CPAP 03/01/2017  . Chronic systolic CHF (congestive heart failure) ([place]) 11/24/2016  . Shortness of breath   . Abnormal EKG   . DKA (diabetic ketoacidoses) 04/18/2016  . CAP (community acquired pneumonia) 04/18/2016  . Accelerated hypertension 04/18/2016  . Abdominal pain 04/18/2016  . 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[place]) 08/19/2015  . Poorly controlled type 1 diabetes mellitus ([place]) 08/07/2015  . Required emergent intubation 03/07/2014  . LADA (latent autoimmune diabetes in adults), managed as type 1 ([place]) 09/15/2011  . GERD without esophagitis 08/18/2011  . Hyperlipemia 08/18/2011  . Implantable cardioverter-defibrillator (ICD) in situ 08/18/2011  . Ischemic cardiomyopathy 08/18/2011  . MI (myocardial infarction) ([place]) 03/11/2010    Past Surgical History:  Procedure Laterality Date  . CARDIAC DEFIBRILLATOR PLACEMENT  08/30/2010  . CHOLECYSTECTOMY N/A 11/07/2017   Procedure: LAPAROSCOPIC CHOLECYSTECTOMY;  Surgeon: Herbert Pun, MD;  Location: [facility] ORS;  Service: General;  Laterality: N/A;  . CORONARY ANGIOPLASTY  2011   stent placed x 1  . CYSTOSCOPY N/A 01/03/2020   Procedure: CYSTOSCOPY FLEXIBLE;  Surgeon: Hollice Espy, MD;  Location: [facility] ORS;  Service: Urology;  Laterality: N/A;  . ESOPHAGOGASTRODUODENOSCOPY (EGD) WITH PROPOFOL N/A 03/20/2018   Procedure: ESOPHAGOGASTRODUODENOSCOPY (EGD) WITH  PROPOFOL;  Surgeon: Manya Silvas, MD;  Location: [facility] ENDOSCOPY;  Service: Endoscopy;  Laterality: N/A;  . LEFT HEART CATH AND CORONARY ANGIOGRAPHY N/A 11/23/2016   Procedure: Left Heart Cath and Coronary Angiography;  Surgeon: Nelva Bush, MD;  Location: [facility] CV LAB;  Service: Cardiovascular;  Laterality: N/A;  . PROSTATE BIOPSY  2019  . PROSTATE BIOPSY N/A 10/23/2019   Procedure: PROSTATE BIOPSY;  Surgeon: Abbie Sons, MD;  Location: [facility] ORS;  Service: Urology;  Laterality: N/A;  . ROBOT ASSISTED LAPAROSCOPIC RADICAL PROSTATECTOMY N/A 01/03/2020   Procedure: XI ROBOTIC ASSISTED LAPAROSCOPIC RADICAL PROSTATECTOMY;  Surgeon: Hollice Espy, MD;  Location: [facility] ORS;  Service: Urology;  Laterality: N/A;  . TRANSRECTAL ULTRASOUND N/A 10/23/2019   Procedure: TRANSRECTAL ULTRASOUND;  Surgeon: Abbie Sons, MD;  Location: [facility] ORS;  Service: Urology;  Laterality: N/A;  . WRIST SURGERY Right 2008    Prior to Admission medications   Medication Sig Start Date End Date Taking? Authorizing Provider  acetaminophen (TYLENOL) 500 MG tablet Take 500 mg by mouth every 6 (six) hours as needed for fever.    [provider]  albuterol (PROVENTIL HFA) 108 (90 Base) MCG/ACT inhaler Inhale 2 puffs into the lungs every 4 (four) hours as needed for wheezing or shortness of breath. 10/08/18   Carrie Mew, MD  aspirin EC 81 MG tablet Take 81 mg by mouth every morning.    [provider]  budesonide (PULMICORT) 1 MG/2ML nebulizer solution Inhale 1 mg into the lungs daily as needed. Pt has not gotten Rx filled as of 12-31-19 11/27/19 11/26/20  [provider]  carvedilol (COREG) 25 MG tablet Take 1 tablet (25 mg total) by mouth 2 (two) times daily. 01/15/20   Lorella Nimrod, MD  Cholecalciferol (D3-1000) 1000 units capsule Take 1,000 Units by mouth daily.    [provider]  clindamycin (CLEOCIN) 300 MG capsule Take 1 capsule (300 mg total) by mouth 3 (three)  times daily. 04/05/20   Paulette Blanch, MD  Continuous Blood Gluc Receiver (FREESTYLE LIBRE 14 DAY READER) DEVI Use 1 Device continuously as needed 02/07/18   [provider]  Continuous Blood Gluc Sensor (FREESTYLE LIBRE 14 DAY SENSOR) MISC Use 1 Device every 14 (fourteen) days 02/07/18   [provider]  digoxin (LANOXIN) 0.125 MG tablet Take 1 tablet (125 mcg total) by mouth daily. 01/15/20   Lorella Nimrod, MD  docusate sodium (COLACE) 100 MG capsule Take 1 capsule (100 mg total) by mouth 2 (two) times daily. 01/04/20   Vaillancourt, Samantha, PA-C  fexofenadine-pseudoephedrine (ALLEGRA-D) 60-120 MG 12 hr tablet Take 1 tablet by mouth 2 (two) times daily. Patient taking differently: Take 1 tablet by mouth as needed.  09/12/19   Sable Feil, PA-C  fluticasone (FLONASE) 50 MCG/ACT nasal spray Place 2 sprays into both nostrils daily as needed for allergies.  01/26/16   [provider]  furosemide (LASIX) 20 MG tablet Take 1 tablet (20 mg total) by mouth 2 (two) times daily. 12/01/19   Carrie Mew, MD  glucagon 1 MG injection Inject into the muscle. 02/07/18   [provider]  glucagon, human recombinant, (GLUCAGEN DIAGNOSTIC) 1 MG injection Inject into the muscle. 02/07/18   [provider]  insulin aspart (NOVOLOG) 100 UNIT/ML FlexPen Inject 18-20-22 u TID AC plus sliding scale for up to 75 units TDD 01/10/20   [provider]  insulin degludec (TRESIBA FLEXTOUCH) 200 UNIT/ML FlexTouch Pen Inject into the skin. 01/29/20   [provider]  insulin glargine, 2 Unit Dial, (TOUJEO MAX SOLOSTAR) 300 UNIT/ML Solostar Pen Inject 50 Units into the skin daily. 01/15/20   Lorella Nimrod, MD  Insulin Pen Needle (B-D ULTRAFINE III SHORT PEN) 31G X 8 MM MISC USE 5 TIMES DAILY AS  DIRECTED 12/01/19   Carrie Mew, MD  loratadine (CLARITIN) 10 MG tablet Take 10 mg by mouth daily as needed.     [provider]  losartan (COZAAR) 100 MG tablet Take 1  tablet (100 mg total) by mouth at bedtime. 12/01/19   Carrie Mew, MD  montelukast (SINGULAIR) 10 MG tablet Take by mouth. 11/27/19 11/26/20  [provider]  naproxen sodium (ALEVE) 220 MG tablet Take 220 mg by mouth daily as needed.    [provider]  oxybutynin (DITROPAN) 5 MG tablet Take 1 tablet (5 mg total) by mouth every 8 (eight) hours as needed for bladder spasms. 01/04/20   Vaillancourt, Aldona Bar, PA-C  oxyCODONE-acetaminophen (PERCOCET/ROXICET) 5-325 MG tablet Take 1 tablet by mouth every 4 (four) hours as needed for severe pain. 04/05/20   Paulette Blanch, MD  pantoprazole (PROTONIX) 40 MG tablet Take 1 tablet (40 mg total) by mouth daily. Patient taking differently: Take 40 mg by mouth as needed.  04/21/16   [name], MD  spironolactone (ALDACTONE) 25 MG tablet Take 0.5 tablets (12.5 mg total) by mouth daily. 01/15/20   Lorella Nimrod, MD    Allergies Vancomycin and Lisinopril  Family History  Problem Relation Age of Onset  . Hypertension Mother   . Heart attack Paternal Grandmother   . Heart attack Father   . Bladder Cancer Neg Hx   . Kidney cancer Neg Hx   . Prostate cancer Neg Hx     Social History Social History   Tobacco Use  . Smoking status: Never Smoker  . Smokeless tobacco: Never Used  Vaping Use  . Vaping Use: Never used  Substance Use Topics  . Alcohol use: No  . Drug use: No    Review of Systems  Constitutional:  fever/chills Eyes: No visual changes. ENT: No sore throat. Cardiovascular: Denies chest pain. Respiratory: Some shortness of breath. Gastrointestinal: No abdominal pain.  No nausea, no vomiting.  No diarrhea.  No constipation. Genitourinary: Negative for dysuria. Musculoskeletal: Negative for back pain. Skin: Negative for rash. Neurological: Negative for headaches, focal weakness   ____________________________________________   PHYSICAL EXAM:  VITAL SIGNS: ED Triage Vitals  Enc Vitals Group     BP  07/14/20 1446 (!) 141/77     Pulse Rate 07/14/20 1446 (!) 105     Resp 07/14/20 1446 (!) 26     Temp 07/14/20 1446 (!) 100.5 F (38.1 C)     Temp Source 07/14/20 1446 Oral     SpO2 07/14/20 1446 95 %     Weight 07/14/20 1444 210 lb (95.3 kg)     Height 07/14/20 1444 6\' 1"  (1.854 m)     Head Circumference --      Peak Flow --      Pain Score 07/14/20 1444 10     Pain Loc --  Pain Edu? --      Excl. in [place]? --     Constitutional: Alert and oriented. Well appearing and in no acute distress. Eyes: Conjunctivae are normal.  Head: Atraumatic. Nose: No congestion/rhinnorhea. Mouth/Throat: Mucous membranes are moist.  Oropharynx non-erythematous. Neck: No stridor. Cardiovascular: Rapid rate, regular rhythm. Grossly normal heart sounds.  Good peripheral circulation. Respiratory: Normal respiratory effort.  No retractions. Lungs CTAB. Gastrointestinal: Soft and nontender. No distention. No abdominal bruits.  Musculoskeletal: Left leg is normal.  Right leg is swollen reddish warm and diffusely tender especially in the foot and the groin.  There are some shotty nodes in the right groin. Neurologic:  Normal speech and language. No gross focal neurologic deficits are appreciated.  Skin:  Skin is warm, dry and intact except from the sole of the right foot. No rash noted.   ____________________________________________   LABS (all labs ordered are listed, but only abnormal results are displayed)  Labs Reviewed  CBC - Abnormal; Notable for the following components:      Result Value   WBC 10.9 (*)    All other components within normal limits  COMPREHENSIVE METABOLIC PANEL - Abnormal; Notable for the following components:   Glucose, Bld 278 (*)    All other components within normal limits  LACTIC ACID, PLASMA - Abnormal; Notable for the following components:   Lactic Acid, Venous 2.0 (*)    All other components within normal limits  SEDIMENTATION RATE - Abnormal; Notable for the  following components:   Sed Rate 26 (*)    All other components within normal limits  C-REACTIVE PROTEIN - Abnormal; Notable for the following components:   CRP 1.4 (*)    All other components within normal limits  RESPIRATORY PANEL BY RT PCR (FLU A&B, COVID)  URINE CULTURE  CULTURE, BLOOD (ROUTINE X 2)  CULTURE, BLOOD (ROUTINE X 2)  LACTIC ACID, PLASMA  PROTIME-INR  APTT  BRAIN NATRIURETIC PEPTIDE  URINALYSIS, COMPLETE (UACMP) WITH MICROSCOPIC  TROPONIN I (HIGH SENSITIVITY)  TROPONIN I (HIGH SENSITIVITY)   ____________________________________________  EKG  EKG read interpreted by me shows normal sinus rhythm rate of 99 extreme right axis decreased R wave progression ____________________________________________  RADIOLOGY Gertha Calkin, personally viewed and evaluated these images (plain radiographs) as part of my medical decision making, as well as reviewing the written report by the radiologist.  ED MD interpretation: X-ray of the chest and foot read by radiology and reviewed by me do not show any acute changes except for the ulcer that shows up on the foot x-ray.  There are no bony changes on the foot however  Official radiology report(s): DG Chest 2 View  Result Date: 07/14/2020 CLINICAL DATA:  Shortness of breath EXAM: CHEST - 2 VIEW COMPARISON:  01/11/2020 FINDINGS: Cardiac shadow is stable. Defibrillator is again noted and stable. Lungs are hyperinflated bilaterally. No focal infiltrate or effusion is seen. No bony abnormality is noted. IMPRESSION: COPD without acute abnormality. Electronically Signed   By: Inez Catalina M.D.   On: 07/14/2020 15:51   DG Foot Complete Right  Result Date: 07/14/2020 CLINICAL DATA:  Foot ulcer EXAM: RIGHT FOOT COMPLETE - 3+ VIEW COMPARISON:  04/05/2020 FINDINGS: Soft tissue prominence is noted near the head of the first metatarsal consistent with the given clinical history of foot ulcer. No underlying bony erosive changes are seen. No  acute fracture or dislocation is noted. IMPRESSION: Soft tissue changes consistent with the given clinical history. No bony erosive changes  are seen. Electronically Signed   By: Inez Catalina M.D.   On: 07/14/2020 15:53    ____________________________________________   PROCEDURES  Procedure(s) performed (including Critical Care):  Procedures   ____________________________________________   INITIAL IMPRESSION / ASSESSMENT AND PLAN / ED COURSE Patient with an infected diabetic foot ulcer that meets severe criteria.  He is febrile somewhat tachycardic has an elevated sed rate and CRP.  We will get him in the hospital and get some antibiotics going we will make sure he gets on all of his medicines.  Covid test is pending currently.              ____________________________________________   FINAL CLINICAL IMPRESSION(S) / ED DIAGNOSES  Final diagnoses:  Diabetic ulcer of toe of right foot associated with type 2 diabetes mellitus, unspecified ulcer stage [facility])     ED Discharge Orders    None      *Please note:  PRUITT TABOADA was evaluated in Emergency Department on 07/14/2020 for the symptoms described in the history of present illness. He was evaluated in the context of the global COVID-19 pandemic, which necessitated consideration that the patient might be at risk for infection with the SARS-CoV-2 virus that causes COVID-19. Institutional protocols and algorithms that pertain to the evaluation of patients at risk for COVID-19 are in a state of rapid change based on information released by regulatory bodies including the CDC and federal and state organizations. These policies and algorithms were followed during the patient's care in the ED.  Some ED evaluations and interventions may be delayed as a result of limited staffing during and the pandemic.*   Note:  This document was prepared using Dragon voice recognition software and may include unintentional dictation errors.      Nena Polio, MD 07/14/20 2155

## 2020-07-14 NOTE — ED Triage Notes (Signed)
First Nurse Note:  Arrives with multiple complaints.  C/O being out of medication, a foot ulcer, and feeling SOB.  AAOx3.  Skin warm and dry.  No SOB/ DOE.  NAD

## 2020-07-14 NOTE — H&P (Addendum)
History and Physical    KENNET MCCORT XBJ:[id number] DOB: September 26, 1971 DOA: 07/14/2020  PCP: [facility]   Patient coming from: home  I have personally briefly reviewed patient's old medical records in [place]  Chief Complaint: Fever, malaise, generalized body aches, ulcer right foot  HPI: Joseph [name] is a 49 y.o. male with medical history significant for DM, HTN, systolic CHF with ischemic cardiomyopathy and AICD in situ, HTN, CAD, history of prostate cancer status post prostatectomy on 01/03/20 who presents to the emergency room with main complaint of fever, generalized malaise and painful ulcer below right great toe.  He denies oozing from the ulcer but states that the ulcer is getting increasingly more painful and the pain is now extending up his leg.  He denies cough, shortness of breath , chest pain, nausea, vomiting, diarrhea. ED Course: On arrival, temperature 100.5, tacky at 105 tachypneic at 26, BP 141/77 O2 sat 95% on room air.  Blood work significant for WBC of 10,900, lactic acid of 2.  Sed rate 26, CRP 1.4, blood sugar 278.  Covid and flu negative.  Urinalysis pending.  Chest x-ray with no acute findings.  Right foot x-ray with soft tissue findings consistent with foot ulcer right MTP with no bony erosive changes EKG as reviewed by me : Sinus tachycardia with no acute ST-T wave changes Hospitalist consulted for admission.  Review of Systems: As per HPI otherwise all other systems on review of systems negative.    Past Medical History:  Diagnosis Date  . AICD (automatic cardioverter/defibrillator) present 2011  . Asthma   . Cancer [facility])    prostate  . CHF (congestive heart failure) ([place])   . Coronary artery disease   . Diabetes mellitus without complication ([place])   . Dyspnea   . ED (erectile dysfunction)   . GERD (gastroesophageal reflux disease)   . Hyperlipidemia   . Hypertension   . Ischemic cardiomyopathy   . LADA (latent autoimmune  diabetes in adults), managed as type 1 ([place])   . Myocardial infarction [facility]) 2011   anterior MI at [place] s/p BMS to ostial/proximal LAD  . Neuromuscular disorder ([place])    problems in arms. finger tips have been numb/tingling. voltaren works  . Obstructive sleep apnea 2018   cpap. has not used lately. uses a breathe-right strip on nose  . Seizures ([place])    diabetes seizures (low blood sugar); last one 08/2018  . Systolic heart failure ([place])   . Vitamin D deficiency     Past Surgical History:  Procedure Laterality Date  . CARDIAC DEFIBRILLATOR PLACEMENT  08/30/2010  . CHOLECYSTECTOMY N/A 11/07/2017   Procedure: LAPAROSCOPIC CHOLECYSTECTOMY;  Surgeon: Herbert Pun, MD;  Location: [facility] ORS;  Service: General;  Laterality: N/A;  . CORONARY ANGIOPLASTY  2011   stent placed x 1  . CYSTOSCOPY N/A 01/03/2020   Procedure: CYSTOSCOPY FLEXIBLE;  Surgeon: Hollice Espy, MD;  Location: [facility] ORS;  Service: Urology;  Laterality: N/A;  . ESOPHAGOGASTRODUODENOSCOPY (EGD) WITH PROPOFOL N/A 03/20/2018   Procedure: ESOPHAGOGASTRODUODENOSCOPY (EGD) WITH PROPOFOL;  Surgeon: Manya Silvas, MD;  Location: [facility] ENDOSCOPY;  Service: Endoscopy;  Laterality: N/A;  . LEFT HEART CATH AND CORONARY ANGIOGRAPHY N/A 11/23/2016   Procedure: Left Heart Cath and Coronary Angiography;  Surgeon: Nelva Bush, MD;  Location: [place] CV LAB;  Service: Cardiovascular;  Laterality: N/A;  . PROSTATE BIOPSY  2019  . PROSTATE BIOPSY N/A 10/23/2019   Procedure: PROSTATE BIOPSY;  Surgeon: John Giovanni  C, MD;  Location: [facility] ORS;  Service: Urology;  Laterality: N/A;  . ROBOT ASSISTED LAPAROSCOPIC RADICAL PROSTATECTOMY N/A 01/03/2020   Procedure: XI ROBOTIC ASSISTED LAPAROSCOPIC RADICAL PROSTATECTOMY;  Surgeon: Hollice Espy, MD;  Location: [facility] ORS;  Service: Urology;  Laterality: N/A;  . TRANSRECTAL ULTRASOUND N/A 10/23/2019   Procedure: TRANSRECTAL ULTRASOUND;  Surgeon: Abbie Sons, MD;  Location: [facility] ORS;   Service: Urology;  Laterality: N/A;  . WRIST SURGERY Right 2008     reports that he has never smoked. He has never used smokeless tobacco. He reports that he does not drink alcohol and does not use drugs.  Allergies  Allergen Reactions  . Vancomycin Shortness Of Breath  . Lisinopril Rash    Family History  Problem Relation Age of Onset  . Hypertension Mother   . Heart attack Paternal Grandmother   . Heart attack Father   . Bladder Cancer Neg Hx   . Kidney cancer Neg Hx   . Prostate cancer Neg Hx       Prior to Admission medications   Medication Sig Start Date End Date Taking? Authorizing Provider  acetaminophen (TYLENOL) 500 MG tablet Take 500 mg by mouth every 6 (six) hours as needed for fever.    [provider]  albuterol (PROVENTIL HFA) 108 (90 Base) MCG/ACT inhaler Inhale 2 puffs into the lungs every 4 (four) hours as needed for wheezing or shortness of breath. 10/08/18   Carrie Mew, MD  aspirin EC 81 MG tablet Take 81 mg by mouth every morning.    [provider]  budesonide (PULMICORT) 1 MG/2ML nebulizer solution Inhale 1 mg into the lungs daily as needed. Pt has not gotten Rx filled as of 12-31-19 11/27/19 11/26/20  [provider]  carvedilol (COREG) 25 MG tablet Take 1 tablet (25 mg total) by mouth 2 (two) times daily. 01/15/20   Lorella Nimrod, MD  Cholecalciferol (D3-1000) 1000 units capsule Take 1,000 Units by mouth daily.    [provider]  clindamycin (CLEOCIN) 300 MG capsule Take 1 capsule (300 mg total) by mouth 3 (three) times daily. 04/05/20   Paulette Blanch, MD  Continuous Blood Gluc Receiver (FREESTYLE LIBRE 14 DAY READER) DEVI Use 1 Device continuously as needed 02/07/18   [provider]  Continuous Blood Gluc Sensor (FREESTYLE LIBRE 14 DAY SENSOR) MISC Use 1 Device every 14 (fourteen) days 02/07/18   [provider]  digoxin (LANOXIN) 0.125 MG tablet Take 1 tablet (125 mcg total) by mouth daily. 01/15/20    Lorella Nimrod, MD  docusate sodium (COLACE) 100 MG capsule Take 1 capsule (100 mg total) by mouth 2 (two) times daily. 01/04/20   Vaillancourt, Samantha, PA-C  fexofenadine-pseudoephedrine (ALLEGRA-D) 60-120 MG 12 hr tablet Take 1 tablet by mouth 2 (two) times daily. Patient taking differently: Take 1 tablet by mouth as needed.  09/12/19   Sable Feil, PA-C  fluticasone (FLONASE) 50 MCG/ACT nasal spray Place 2 sprays into both nostrils daily as needed for allergies.  01/26/16   [provider]  furosemide (LASIX) 20 MG tablet Take 1 tablet (20 mg total) by mouth 2 (two) times daily. 12/01/19   Carrie Mew, MD  glucagon 1 MG injection Inject into the muscle. 02/07/18   [provider]  glucagon, human recombinant, (GLUCAGEN DIAGNOSTIC) 1 MG injection Inject into the muscle. 02/07/18   [provider]  insulin aspart (NOVOLOG) 100 UNIT/ML FlexPen Inject 18-20-22 u TID AC plus sliding scale for up to 75 units  TDD 01/10/20   [provider]  insulin degludec (TRESIBA FLEXTOUCH) 200 UNIT/ML FlexTouch Pen Inject into the skin. 01/29/20   [provider]  insulin glargine, 2 Unit Dial, (TOUJEO MAX SOLOSTAR) 300 UNIT/ML Solostar Pen Inject 50 Units into the skin daily. 01/15/20   Lorella Nimrod, MD  Insulin Pen Needle (B-D ULTRAFINE III SHORT PEN) 31G X 8 MM MISC USE 5 TIMES DAILY AS  DIRECTED 12/01/19   Carrie Mew, MD  loratadine (CLARITIN) 10 MG tablet Take 10 mg by mouth daily as needed.     [provider]  losartan (COZAAR) 100 MG tablet Take 1 tablet (100 mg total) by mouth at bedtime. 12/01/19   Carrie Mew, MD  montelukast (SINGULAIR) 10 MG tablet Take by mouth. 11/27/19 11/26/20  [provider]  naproxen sodium (ALEVE) 220 MG tablet Take 220 mg by mouth daily as needed.    [provider]  oxybutynin (DITROPAN) 5 MG tablet Take 1 tablet (5 mg total) by mouth every 8 (eight) hours as needed for bladder spasms. 01/04/20    Vaillancourt, Aldona Bar, PA-C  oxyCODONE-acetaminophen (PERCOCET/ROXICET) 5-325 MG tablet Take 1 tablet by mouth every 4 (four) hours as needed for severe pain. 04/05/20   Paulette Blanch, MD  pantoprazole (PROTONIX) 40 MG tablet Take 1 tablet (40 mg total) by mouth daily. Patient taking differently: Take 40 mg by mouth as needed.  04/21/16   [name], MD  spironolactone (ALDACTONE) 25 MG tablet Take 0.5 tablets (12.5 mg total) by mouth daily. 01/15/20   Lorella Nimrod, MD    Physical Exam: Vitals:   07/14/20 1444 07/14/20 1446 07/14/20 2000 07/14/20 2102  BP:  (!) 141/77 (!) 151/96 (!) 152/91  Pulse:  (!) 105 90 91  Resp:  (!) 26 (!) 21 20  Temp:  (!) 100.5 F (38.1 C) 99.8 F (37.7 C)   TempSrc:  Oral Oral   SpO2:  95% 100% 100%  Weight: 95.3 kg     Height: 6\' 1"  (1.854 m)        Vitals:   07/14/20 1444 07/14/20 1446 07/14/20 2000 07/14/20 2102  BP:  (!) 141/77 (!) 151/96 (!) 152/91  Pulse:  (!) 105 90 91  Resp:  (!) 26 (!) 21 20  Temp:  (!) 100.5 F (38.1 C) 99.8 F (37.7 C)   TempSrc:  Oral Oral   SpO2:  95% 100% 100%  Weight: 95.3 kg     Height: 6\' 1"  (1.854 m)         Constitutional: Alert and oriented x 3 . Not in any apparent distress HEENT:      Head: Normocephalic and atraumatic.         Eyes: PERLA, EOMI, Conjunctivae are normal. Sclera is non-icteric.       Mouth/Throat: Mucous membranes are moist.       Neck: Supple with no signs of meningismus. Cardiovascular:  Regular, tachycardic. No murmurs, gallops, or rubs. 2+ symmetrical distal pulses are present . No JVD.  1+ right LE edema Respiratory: Respiratory effort normal .Lungs sounds clear bilaterally. No wheezes, crackles, or rhonchi.  Gastrointestinal: Soft, non tender, and non distended with positive bowel sounds. No rebound or guarding. Genitourinary: No CVA tenderness. Musculoskeletal:Also with blood blister at base of right MTP with redness and differential warmth extending up right lower leg,  tender inguinal lymphadenopathy Neurologic:  Face is symmetric. Moving all extremities. No gross focal neurologic deficits . Skin:  Also with blood blister at base of right  MTP with redness and differential warmth extending up right lower leg, tender inguinal lymphadenopathy Psychiatric: Mood and affect are normal    Labs on Admission: I have personally reviewed following labs and imaging studies  CBC: Recent Labs  Lab 07/14/20 1448  WBC 10.9*  HGB 15.2  HCT 43.4  MCV 86.1  PLT 161   Basic Metabolic Panel: Recent Labs  Lab 07/14/20 1448  NA 136  K 4.3  CL 102  CO2 26  GLUCOSE 278*  BUN 16  CREATININE 1.13  CALCIUM 9.5   GFR: Estimated Creatinine Clearance: 89.4 mL/min (by C-G formula based on SCr of 1.13 mg/dL). Liver Function Tests: Recent Labs  Lab 07/14/20 1448  AST 22  ALT 27  ALKPHOS 103  BILITOT 0.8  PROT 7.5  ALBUMIN 3.8   No results for input(s): LIPASE, AMYLASE in the last 168 hours. No results for input(s): AMMONIA in the last 168 hours. Coagulation Profile: Recent Labs  Lab 07/14/20 1728  INR 1.0   Cardiac Enzymes: No results for input(s): CKTOTAL, CKMB, CKMBINDEX, TROPONINI in the last 168 hours. BNP (last 3 results) No results for input(s): PROBNP in the last 8760 hours. HbA1C: No results for input(s): HGBA1C in the last 72 hours. CBG: No results for input(s): GLUCAP in the last 168 hours. Lipid Profile: No results for input(s): CHOL, HDL, LDLCALC, TRIG, CHOLHDL, LDLDIRECT in the last 72 hours. Thyroid Function Tests: No results for input(s): TSH, T4TOTAL, FREET4, T3FREE, THYROIDAB in the last 72 hours. Anemia Panel: No results for input(s): VITAMINB12, FOLATE, FERRITIN, TIBC, IRON, RETICCTPCT in the last 72 hours. Urine analysis:    Component Value Date/Time   COLORURINE AMBER (A) 01/12/2020 0010   APPEARANCEUR [place] (A) 02/01/2020 1429   LABSPEC 1.039 (H) 01/12/2020 0010   PHURINE 5.0 01/12/2020 0010   GLUCOSEU 2+ (A) 02/01/2020  1429   HGBUR LARGE (A) 01/12/2020 0010   BILIRUBINUR Negative 02/01/2020 1429   KETONESUR 5 (A) 01/12/2020 0010   PROTEINUR 3+ (A) 02/01/2020 1429   PROTEINUR 100 (A) 01/12/2020 0010   NITRITE Positive (A) 02/01/2020 1429   NITRITE NEGATIVE 01/12/2020 0010   LEUKOCYTESUR Trace (A) 02/01/2020 1429   LEUKOCYTESUR LARGE (A) 01/12/2020 0010    Radiological Exams on Admission: DG Chest 2 View  Result Date: 07/14/2020 CLINICAL DATA:  Shortness of breath EXAM: CHEST - 2 VIEW COMPARISON:  01/11/2020 FINDINGS: Cardiac shadow is stable. Defibrillator is again noted and stable. Lungs are hyperinflated bilaterally. No focal infiltrate or effusion is seen. No bony abnormality is noted. IMPRESSION: COPD without acute abnormality. Electronically Signed   By: Inez Catalina M.D.   On: 07/14/2020 15:51   DG Foot Complete Right  Result Date: 07/14/2020 CLINICAL DATA:  Foot ulcer EXAM: RIGHT FOOT COMPLETE - 3+ VIEW COMPARISON:  04/05/2020 FINDINGS: Soft tissue prominence is noted near the head of the first metatarsal consistent with the given clinical history of foot ulcer. No underlying bony erosive changes are seen. No acute fracture or dislocation is noted. IMPRESSION: Soft tissue changes consistent with the given clinical history. No bony erosive changes are seen. Electronically Signed   By: Inez Catalina M.D.   On: 07/14/2020 15:53     Assessment/Plan 49 year old male with history of DM type 1, HTN, systolic CHF with ischemic cardiomyopathy and AICD in situ, HTN, CAD, history of prostate cancer status post prostatectomy on 01/03/20 presenting with fever, generalized malaise and painful ulcer right great toe.   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 Added vancomycin -IV fluids with close monitoring for fluid overload -Podiatry consult -Follow-up urinalysis.  Patient had severe sepsis secondary to UTI in April 2021 that was related to Foley catheterization following his recent prostatectomy    Hyperglycemia due to type 1 diabetes mellitus ([place]) -Blood sugar 278 -Sliding scale insulin for now and follow O0A  Chronic systolic CHF (congestive heart failure) ([facility])   Implantable cardioverter-defibrillator (ICD) in situ   Ischemic cardiomyopathy -Patient appears euvolemic with no complaints of chest pain and with chest x-ray clear -Most recent echo on 11/2016 with EF 25 to 30% -Monitor for fluid overload in view of IV hydration in the treatment of sepsis -Continue home losartan, carvedilol, beta-blocker, digoxin, spironolactone and furosemide -Daily weights and intake and output monitoring    CAD (coronary artery disease) -No complaints of chest pain.  EKG nonacute -Continue aspirin, carvedilol.  Statin not seen on med list    Hypertension -Continue losartan and carvedilol    DVT prophylaxis: Lovenox  Code Status: full code  Family Communication:  none  Disposition Plan: Back to previous home environment Consults called: podiatry Status:At the time of admission, it appears that the appropriate admission status for this patient is INPATIENT. This is judged to be reasonable and necessary in order to provide the required intensity of service to ensure the patient's safety given the presenting symptoms, physical exam findings, and initial radiographic and laboratory data in the context of their  Comorbid conditions.   Patient requires inpatient status due to high intensity of service, high risk for further deterioration and high frequency of surveillance required.   I certify that at the point of admission it is my clinical  judgment that the patient will require inpatient hospital care spanning beyond [place] MD [facility]     07/14/2020, 10:48 PM

## 2020-07-14 NOTE — ED Triage Notes (Signed)
Pt presents to ED via POV with c/o being out of his carvedilol, digoxin, potassium, and lasix x several days. Pt states fever and SOB. Pt with noted dyspnea on exertion. Pt also c/o ulcer to bottom of R foot, states is not draining but is hard to touch and discolored on the bottom.   Pt also c/o generalized body aches at this time.

## 2020-07-15 LAB — CBC
HCT: 39.1 % (ref 39.0–52.0)
Hemoglobin: 13.9 g/dL (ref 13.0–17.0)
MCH: 30.5 pg (ref 26.0–34.0)
MCHC: 35.5 g/dL (ref 30.0–36.0)
MCV: 85.7 fL (ref 80.0–100.0)
Platelets: 148 10*3/uL — ABNORMAL LOW (ref 150–400)
RBC: 4.56 MIL/uL (ref 4.22–5.81)
RDW: 13 % (ref 11.5–15.5)
WBC: 9.4 10*3/uL (ref 4.0–10.5)
nRBC: 0 % (ref 0.0–0.2)

## 2020-07-15 LAB — BASIC METABOLIC PANEL
Anion gap: 9 (ref 5–15)
BUN: 17 mg/dL (ref 6–20)
CO2: 23 mmol/L (ref 22–32)
Calcium: 8.4 mg/dL — ABNORMAL LOW (ref 8.9–10.3)
Chloride: 105 mmol/L (ref 98–111)
Creatinine, Ser: 1.03 mg/dL (ref 0.61–1.24)
GFR calc Af Amer: >60 mL/min (ref >60)
GFR calc non Af Amer: >60 mL/min (ref >60)
Glucose, Bld: 151 mg/dL — ABNORMAL HIGH (ref 70–99)
Potassium: 3.8 mmol/L (ref 3.5–5.1)
Sodium: 137 mmol/L (ref 135–145)

## 2020-07-15 LAB — GLUCOSE, CAPILLARY
Glucose-Capillary: 142 mg/dL — ABNORMAL HIGH (ref 70–99)
Glucose-Capillary: 369 mg/dL — ABNORMAL HIGH (ref 70–99)

## 2020-07-15 LAB — PROCALCITONIN: Procalcitonin: 0.26 ng/mL

## 2020-07-15 LAB — HEMOGLOBIN A1C
Hgb A1c MFr Bld: 10.1 % — ABNORMAL HIGH (ref 4.8–5.6)
Mean Plasma Glucose: 243.17 mg/dL

## 2020-07-15 LAB — CORTISOL-AM, BLOOD: Cortisol - AM: 10.1 ug/dL (ref 6.7–22.6)

## 2020-07-15 LAB — PROTIME-INR
INR: 1.1 (ref 0.8–1.2)
Prothrombin Time: 13.6 seconds (ref 11.4–15.2)

## 2020-07-15 MED ORDER — SULFAMETHOXAZOLE-TRIMETHOPRIM 800-160 MG PO TABS: 2.0000 | ORAL_TABLET | Freq: Two times a day (BID) | ORAL | 0 refills | Status: AC

## 2020-07-15 MED ORDER — SULFAMETHOXAZOLE-TRIMETHOPRIM 800-160 MG PO TABS: 2.0000 | ORAL_TABLET | Freq: Two times a day (BID) | ORAL | Status: DC

## 2020-07-15 MED ORDER — LINEZOLID 600 MG/300ML IV SOLN
600.0000 mg | Freq: Two times a day (BID) | INTRAVENOUS | Status: DC
  Administered 2020-07-15: 600 mg via INTRAVENOUS
  Filled 2020-07-15 (×2): qty 300

## 2020-07-15 NOTE — Consult Note (Signed)
ORTHOPAEDIC CONSULTATION  REQUESTING PHYSICIAN: Enzo Bi, MD  Chief Complaint: Right foot infection  HPI: Joseph [name] is a 49 y.o. male who complains of worsening swollen area to his right great toe joint region.  He has had a history of an ulcer in in the past.  Has been seen by podiatry previously.  He noticed swelling and worsening pain around his great toe joint and presented to the ER.  Does have a longstanding history of diabetes with some neuropathy.  Past Medical History:  Diagnosis Date  . AICD (automatic cardioverter/defibrillator) present 2011  . Asthma   . Cancer [facility])    prostate  . CHF (congestive heart failure) ([place])   . Coronary artery disease   . Diabetes mellitus without complication ([place])   . Dyspnea   . ED (erectile dysfunction)   . GERD (gastroesophageal reflux disease)   . Hyperlipidemia   . Hypertension   . Ischemic cardiomyopathy   . LADA (latent autoimmune diabetes in adults), managed as type 1 ([place])   . Myocardial infarction [facility]) 2011   anterior MI at [place] s/p BMS to ostial/proximal LAD  . Neuromuscular disorder ([place])    problems in arms. finger tips have been numb/tingling. voltaren works  . Obstructive sleep apnea 2018   cpap. has not used lately. uses a breathe-right strip on nose  . Seizures ([place])    diabetes seizures (low blood sugar); last one 08/2018  . Systolic heart failure ([place])   . Vitamin D deficiency    Past Surgical History:  Procedure Laterality Date  . CARDIAC DEFIBRILLATOR PLACEMENT  08/30/2010  . CHOLECYSTECTOMY N/A 11/07/2017   Procedure: LAPAROSCOPIC CHOLECYSTECTOMY;  Surgeon: Herbert Pun, MD;  Location: [facility] ORS;  Service: General;  Laterality: N/A;  . CORONARY ANGIOPLASTY  2011   stent placed x 1  . CYSTOSCOPY N/A 01/03/2020   Procedure: CYSTOSCOPY FLEXIBLE;  Surgeon: Hollice Espy, MD;  Location: [facility] ORS;  Service: Urology;  Laterality: N/A;  . ESOPHAGOGASTRODUODENOSCOPY (EGD) WITH PROPOFOL N/A 03/20/2018    Procedure: ESOPHAGOGASTRODUODENOSCOPY (EGD) WITH PROPOFOL;  Surgeon: Manya Silvas, MD;  Location: [facility] ENDOSCOPY;  Service: Endoscopy;  Laterality: N/A;  . LEFT HEART CATH AND CORONARY ANGIOGRAPHY N/A 11/23/2016   Procedure: Left Heart Cath and Coronary Angiography;  Surgeon: Nelva Bush, MD;  Location: [place] CV LAB;  Service: Cardiovascular;  Laterality: N/A;  . PROSTATE BIOPSY  2019  . PROSTATE BIOPSY N/A 10/23/2019   Procedure: PROSTATE BIOPSY;  Surgeon: Abbie Sons, MD;  Location: [facility] ORS;  Service: Urology;  Laterality: N/A;  . ROBOT ASSISTED LAPAROSCOPIC RADICAL PROSTATECTOMY N/A 01/03/2020   Procedure: XI ROBOTIC ASSISTED LAPAROSCOPIC RADICAL PROSTATECTOMY;  Surgeon: Hollice Espy, MD;  Location: [facility] ORS;  Service: Urology;  Laterality: N/A;  . TRANSRECTAL ULTRASOUND N/A 10/23/2019   Procedure: TRANSRECTAL ULTRASOUND;  Surgeon: Abbie Sons, MD;  Location: [facility] ORS;  Service: Urology;  Laterality: N/A;  . WRIST SURGERY Right 2008   Social History   Socioeconomic History  . Marital status: Divorced    Spouse name: Not on file  . Number of children: Not on file  . Years of education: Not on file  . Highest education level: Not on file  Occupational History  . Occupation: Glass blower/designer  Tobacco Use  . Smoking status: Never Smoker  . Smokeless tobacco: Never Used  Vaping Use  . Vaping Use: Never used  Substance and Sexual Activity  . Alcohol use: No  . Drug use: No  . Sexual activity:  Yes  Other Topics Concern  . Not on file  Social History Narrative  . Not on file   Social Determinants of Health   Financial Resource Strain:   . Difficulty of Paying Living Expenses: Not on file  Food Insecurity:   . Worried About Charity fundraiser in the Last Year: Not on file  . Ran Out of Food in the Last Year: Not on file  Transportation Needs:   . Lack of Transportation (Medical): Not on file  . Lack of Transportation (Non-Medical): Not on file    Physical Activity:   . Days of Exercise per Week: Not on file  . Minutes of Exercise per Session: Not on file  Stress:   . Feeling of Stress : Not on file  Social Connections:   . Frequency of Communication with Friends and Family: Not on file  . Frequency of Social Gatherings with Friends and Family: Not on file  . Attends Religious Services: Not on file  . Active Member of Clubs or Organizations: Not on file  . Attends Archivist Meetings: Not on file  . Marital Status: Not on file   Family History  Problem Relation Age of Onset  . Hypertension Mother   . Heart attack Paternal Grandmother   . Heart attack Father   . Bladder Cancer Neg Hx   . Kidney cancer Neg Hx   . Prostate cancer Neg Hx    Allergies  Allergen Reactions  . Vancomycin Shortness Of Breath  . Lisinopril Rash   Prior to Admission medications   Medication Sig Start Date End Date Taking? Authorizing Provider  acetaminophen (TYLENOL) 500 MG tablet Take 500 mg by mouth every 6 (six) hours as needed for fever.   Yes [provider]  albuterol (PROVENTIL HFA) 108 (90 Base) MCG/ACT inhaler Inhale 2 puffs into the lungs every 4 (four) hours as needed for wheezing or shortness of breath. 10/08/18  Yes Carrie Mew, MD  aspirin EC 81 MG tablet Take 81 mg by mouth every morning.   Yes [provider]  atorvastatin (LIPITOR) 40 MG tablet Take 40 mg by mouth daily. 06/08/20  Yes [provider]  carvedilol (COREG) 12.5 MG tablet Take 12.5 mg by mouth daily. 05/28/20  Yes [provider]  Cholecalciferol (D3-1000) 1000 units capsule Take 1,000 Units by mouth daily.   Yes [provider]  digoxin (LANOXIN) 0.125 MG tablet Take 1 tablet (125 mcg total) by mouth daily. 01/15/20  Yes Lorella Nimrod, MD  fluticasone (FLONASE) 50 MCG/ACT nasal spray Place 2 sprays into both nostrils daily as needed for allergies.  01/26/16  Yes [provider]  furosemide (LASIX) 20 MG  tablet Take 1 tablet (20 mg total) by mouth 2 (two) times daily. 12/01/19  Yes Carrie Mew, MD  glucagon 1 MG injection Inject into the muscle. 02/07/18  Yes [provider]  glucagon, human recombinant, (GLUCAGEN DIAGNOSTIC) 1 MG injection Inject into the muscle. 02/07/18  Yes [provider]  insulin degludec (TRESIBA FLEXTOUCH) 200 UNIT/ML FlexTouch Pen Inject 50 Units into the skin daily.  01/29/20  Yes [provider]  KLOR-CON M10 10 MEQ tablet Take 10 mEq by mouth daily. 04/29/20  Yes [provider]  loratadine (CLARITIN) 10 MG tablet Take 10 mg by mouth daily as needed.    Yes [provider]  losartan (COZAAR) 100 MG tablet Take 1 tablet (100 mg total) by mouth at bedtime. 12/01/19  Yes Carrie Mew, MD  naproxen sodium (ALEVE) 220 MG tablet Take 220 mg by mouth daily as needed.   Yes [provider]  spironolactone (ALDACTONE) 25 MG tablet Take 0.5 tablets (12.5 mg total) by mouth daily. 01/15/20  Yes Lorella Nimrod, MD  SYMBICORT 160-4.5 MCG/ACT inhaler Inhale 2 puffs into the lungs 2 (two) times daily. 05/27/20  Yes [provider]  Continuous Blood Gluc Receiver (FREESTYLE LIBRE 14 DAY READER) DEVI Use 1 Device continuously as needed 02/07/18   [provider]  Continuous Blood Gluc Sensor (FREESTYLE LIBRE 14 DAY SENSOR) MISC Use 1 Device every 14 (fourteen) days 02/07/18   [provider]  insulin aspart (NOVOLOG) 100 UNIT/ML FlexPen Inject 18-20-22 u TID AC plus sliding scale for up to 75 units TDD Patient not taking: Reported on 07/15/2020 01/10/20   [provider]  Insulin Pen Needle (B-D ULTRAFINE III SHORT PEN) 31G X 8 MM MISC USE 5 TIMES DAILY AS  DIRECTED 12/01/19   Carrie Mew, MD   DG Chest 2 View  Result Date: 07/14/2020 CLINICAL DATA:  Shortness of breath EXAM: CHEST - 2 VIEW COMPARISON:  01/11/2020 FINDINGS: Cardiac shadow is stable. Defibrillator is again noted and stable. Lungs  are hyperinflated bilaterally. No focal infiltrate or effusion is seen. No bony abnormality is noted. IMPRESSION: COPD without acute abnormality. Electronically Signed   By: Inez Catalina M.D.   On: 07/14/2020 15:51   DG Foot Complete Right  Result Date: 07/14/2020 CLINICAL DATA:  Foot ulcer EXAM: RIGHT FOOT COMPLETE - 3+ VIEW COMPARISON:  04/05/2020 FINDINGS: Soft tissue prominence is noted near the head of the first metatarsal consistent with the given clinical history of foot ulcer. No underlying bony erosive changes are seen. No acute fracture or dislocation is noted. IMPRESSION: Soft tissue changes consistent with the given clinical history. No bony erosive changes are seen. Electronically Signed   By: Inez Catalina M.D.   On: 07/14/2020 15:53    Positive ROS: All other systems have been reviewed and were otherwise negative with the exception of those mentioned in the HPI and as above.  12 point ROS was performed.  Physical Exam: General: Alert and oriented.  No apparent distress.  Vascular:  Left foot:Dorsalis Pedis:  present Posterior Tibial:  present  Right foot: Dorsalis Pedis:  present Posterior Tibial:  present  Neuro:absent protective sensation  Derm: On the plantar aspect of the right first MTPJ is a large hemorrhagic blister.  It encompasses the entire plantar great toe joint region.  Obvious bloody drainage was noted.  I was able to remove the superficial nonviable hemorrhagic and hyperkeratotic tissue.  I was able to drain all of the fluid.  There was a little bit of superficial skin breakdown of the epidermis to the dermal layer with central maceration noted.  No areas that probed deep.  No lymphangitic streaking noted to the area.  Upon removal of all the blistered material there is no further residual infectious material at this time.  No necrotic tissue or areas that probed at all.        Ortho/MS: There is edema to the right lower leg at this  time.  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  I was able to remove all of the nonviable tissue and then applied a Betadine dressing to the  area.  This was well-padded.  There was no signs of any deep infection.  X-rays are negative and his white blood cell count is normal.  I suspect with just local wound care upon discharge he should be stable and can follow-up in the outpatient clinic next week for close evaluation.  I discussed with the patient home dressing changes should consist of just local wound care of soap and water and then pat dry and apply Betadine which was provided him today and gauze and a gauze wrap to the region.  Will recommend oral antibiotics.  Should do well with doxycycline or Bactrim.  Once again follow-up with podiatry in 1 week.  Procedure note: The plantar aspect of the right foot was prepped with Betadine.  Next with a combination of scissors and a 15 blade I was able to remove all of the overlying nonviable tissue and remove all of the bloody drainage from the plantar aspect of the right foot including the overlying hyperkeratotic skin.  This was taken down to healthy skin to the dermal layer.  Some mild maceration was noted.  No areas of deep probing were noted.  A Betadine dressing was applied  The wound was superficial in nature and not through the dermal layers.  No exposure of subcutaneous tissue.    Elesa Hacker, DPM Cell [phone]   07/15/2020 1:26 PM

## 2020-07-15 NOTE — ED Notes (Signed)
Patient denies pain and is resting comfortably.  

## 2020-07-15 NOTE — ED Notes (Signed)
Podiatrist at bedside.

## 2020-07-15 NOTE — Discharge Summary (Addendum)
Physician Discharge Summary   Joseph [name]  male DOB: [birth year]  LPF:[id number]  PCP: [facility] [place] date: 07/14/2020 Discharge date: 07/15/2020  Admitted From: home Disposition:  home CODE STATUS: Full code  Discharge Instructions    Discharge instructions   Complete by: As directed    Podiatry had cleaned out the wound beneath your right great toe.  Please continue oral antibiotic Bactrim for 9 more days as directed.    For wound care, clean with soap and water and then pat dry and apply Betadine and a gauze wrap to the region, as instructed by the podiatry.  Follow up with outpatient podiatry in clinic 1 week after discharge.   Dr. Enzo Bi [facility] Course:  For full details, please see H&P, progress notes, consult notes and ancillary notes.  Briefly,  Joseph [name] is a 49 y.o. male with medical history significant for DM, HTN, systolic CHF with ischemic cardiomyopathy and AICD in situ, HTN, CAD, history of prostate cancer status post prostatectomy on 01/03/20 who presented to the emergency room with main complaint of fever, generalized malaise and painful ulcer below right great toe.    Severe sepsis 2/2 Cellulitis right lower extremity Diabetic foot ulcer associated with type 1 diabetes mellitus Patient presented with fever, tachycardia and tachypnea with leukocytosis and lactic acid of 2.  Source is cellulitis related to foot ulcer on the right great toe.  no bony erosive changes on right foot x-ray.  Pt was started on IV ceftriaxone and IV vanc on admission.  Podiatry performed I/D at the bedside and cleared pt for discharge afterwards, on oral Bactrim and to follow up with outpatient podiatry in 1 week.    Hyperglycemia due to type 1 diabetes mellitus ([place]) Uncontrolled. Blood sugar 278 on presentation and A1c 10.1.  Pt was discharged on home regimen and advised to follow up with outpatient provider for better management of his BG  for better wound healing.  Chronic systolic CHF  Implantable cardioverter-defibrillator (ICD) in situ Ischemic cardiomyopathy Patient appeared euvolemic with no complaints of chest pain and with chest x-ray clear.  Most recent echo on 11/2016 with EF 25 to 30%.  Continued home losartan, carvedilol, digoxin, spironolactone and furosemide.    CAD (coronary artery disease) No complaints of chest pain.  EKG unremarkable.  Continued aspirin, and statin.    Hypertension Continued home losartan, carvedilol, spironolactone and furosemide.  Asymptomatic bacteruria  UA and urine cx obtained in the ED, which grew E coli.  Pt had no urinary symptoms, so was not treated for an UTI.    Discharge Diagnoses:  Principal Problem:   Sepsis ([place]) Active Problems:   Chronic systolic CHF (congestive heart failure) (HCC)   CAD (coronary artery disease)   Hyperglycemia due to type 2 diabetes mellitus ([place])   Hypertension   Implantable cardioverter-defibrillator (ICD) in situ   Ischemic cardiomyopathy   Diabetic foot ulcer associated with type 2 diabetes mellitus, with fat layer exposed ([place])   Cellulitis    Discharge Instructions:  Allergies as of 07/15/2020      Reactions   Vancomycin Shortness Of Breath   Lisinopril Rash      Medication List    TAKE these medications   acetaminophen 500 MG tablet Commonly known as: TYLENOL Take 500 mg by mouth every 6 (six) hours as needed for fever.   albuterol 108 (90 Base) MCG/ACT inhaler Commonly known as: Proventil HFA  Inhale 2 puffs into the lungs every 4 (four) hours as needed for wheezing or shortness of breath.   aspirin EC 81 MG tablet Take 81 mg by mouth every morning.   atorvastatin 40 MG tablet Commonly known as: LIPITOR Take 40 mg by mouth daily.   B-D ULTRAFINE III SHORT PEN 31G X 8 MM Misc Generic drug: Insulin Pen Needle USE 5 TIMES DAILY AS  DIRECTED   carvedilol 12.5 MG tablet Commonly known as: COREG Take 12.5 mg by  mouth daily.   D3-1000 25 MCG (1000 UT) capsule Generic drug: Cholecalciferol Take 1,000 Units by mouth daily.   digoxin 0.125 MG tablet Commonly known as: LANOXIN Take 1 tablet (125 mcg total) by mouth daily.   fluticasone 50 MCG/ACT nasal spray Commonly known as: FLONASE Place 2 sprays into both nostrils daily as needed for allergies.   FreeStyle Libre 14 Day Reader Kerrin Mo Use 1 Device continuously as needed   [employer] 14 Day Sensor Misc Use 1 Device every 14 (fourteen) days   furosemide 20 MG tablet Commonly known as: LASIX Take 1 tablet (20 mg total) by mouth 2 (two) times daily.   GlucaGen Diagnostic 1 MG injection Generic drug: glucagon (human recombinant) Inject into the muscle.   glucagon 1 MG injection Inject into the muscle.   insulin aspart 100 UNIT/ML FlexPen Commonly known as: NOVOLOG Inject 18-20-22 u TID AC plus sliding scale for up to 75 units TDD   Klor-Con M10 10 MEQ tablet Generic drug: potassium chloride Take 10 mEq by mouth daily.   loratadine 10 MG tablet Commonly known as: CLARITIN Take 10 mg by mouth daily as needed.   losartan 100 MG tablet Commonly known as: COZAAR Take 1 tablet (100 mg total) by mouth at bedtime.   naproxen sodium 220 MG tablet Commonly known as: ALEVE Take 220 mg by mouth daily as needed.   spironolactone 25 MG tablet Commonly known as: ALDACTONE Take 0.5 tablets (12.5 mg total) by mouth daily.   sulfamethoxazole-trimethoprim 800-160 MG tablet Commonly known as: BACTRIM DS Take 2 tablets by mouth every 12 (twelve) hours for 9 days. For your toe infection.   Symbicort 160-4.5 MCG/ACT inhaler Generic drug: budesonide-formoterol Inhale 2 puffs into the lungs 2 (two) times daily.   Tyler Aas FlexTouch 200 UNIT/ML FlexTouch Pen Generic drug: insulin degludec Inject 50 Units into the skin daily.        Follow-up Information    Samara Deist, DPM. Schedule an appointment as soon as possible for a visit in  1 week(s).   Specialty: Podiatry Contact information: [place] [address] [address], [facility]. Schedule an appointment as soon as possible for a visit in 1 week(s).   Specialty: General Practice Contact information: [place] [address] [phone]               Allergies  Allergen Reactions  . Vancomycin Shortness Of Breath  . Lisinopril Rash     The results of significant diagnostics from this hospitalization (including imaging, microbiology, ancillary and laboratory) are listed below for reference.   Consultations:   Procedures/Studies: DG Chest 2 View  Result Date: 07/14/2020 CLINICAL DATA:  Shortness of breath EXAM: CHEST - 2 VIEW COMPARISON:  01/11/2020 FINDINGS: Cardiac shadow is stable. Defibrillator is again noted and stable. Lungs are hyperinflated bilaterally. No focal infiltrate or effusion is seen. No bony abnormality is noted. IMPRESSION: COPD without acute abnormality.  Electronically Signed   By: Inez Catalina M.D.   On: 07/14/2020 15:51   DG Foot Complete Right  Result Date: 07/14/2020 CLINICAL DATA:  Foot ulcer EXAM: RIGHT FOOT COMPLETE - 3+ VIEW COMPARISON:  04/05/2020 FINDINGS: Soft tissue prominence is noted near the head of the first metatarsal consistent with the given clinical history of foot ulcer. No underlying bony erosive changes are seen. No acute fracture or dislocation is noted. IMPRESSION: Soft tissue changes consistent with the given clinical history. No bony erosive changes are seen. Electronically Signed   By: Inez Catalina M.D.   On: 07/14/2020 15:53      Labs: BNP (last 3 results) Recent Labs    07/14/20 1448  BNP 37.6   Basic Metabolic Panel: Recent Labs  Lab 07/14/20 1448 07/15/20 0706  NA 136 137  K 4.3 3.8  CL 102 105  CO2 26 23  GLUCOSE 278* 151*  BUN 16 17  CREATININE 1.13 1.03  CALCIUM 9.5 8.4*   Liver Function Tests: Recent Labs    Lab 07/14/20 1448  AST 22  ALT 27  ALKPHOS 103  BILITOT 0.8  PROT 7.5  ALBUMIN 3.8   No results for input(s): LIPASE, AMYLASE in the last 168 hours. No results for input(s): AMMONIA in the last 168 hours. CBC: Recent Labs  Lab 07/14/20 1448 07/15/20 0706  WBC 10.9* 9.4  HGB 15.2 13.9  HCT 43.4 39.1  MCV 86.1 85.7  PLT 165 148*   Cardiac Enzymes: No results for input(s): CKTOTAL, CKMB, CKMBINDEX, TROPONINI in the last 168 hours. BNP: Invalid input(s): POCBNP CBG: Recent Labs  Lab 07/14/20 2326 07/15/20 0801 07/15/20 1234  GLUCAP 174* 142* 369*   D-Dimer No results for input(s): DDIMER in the last 72 hours. Hgb A1c Recent Labs    07/14/20 2319  HGBA1C 10.1*   Lipid Profile No results for input(s): CHOL, HDL, LDLCALC, TRIG, CHOLHDL, LDLDIRECT in the last 72 hours. Thyroid function studies No results for input(s): TSH, T4TOTAL, T3FREE, THYROIDAB in the last 72 hours.  Invalid input(s): FREET3 Anemia work up No results for input(s): VITAMINB12, FOLATE, FERRITIN, TIBC, IRON, RETICCTPCT in the last 72 hours. Urinalysis    Component Value Date/Time   COLORURINE YELLOW (A) 07/14/2020 1538   APPEARANCEUR HAZY (A) 07/14/2020 1538   APPEARANCEUR Cloudy (A) 02/01/2020 1429   LABSPEC 1.023 07/14/2020 1538   PHURINE 5.0 07/14/2020 1538   GLUCOSEU >=500 (A) 07/14/2020 1538   HGBUR MODERATE (A) 07/14/2020 1538   BILIRUBINUR NEGATIVE 07/14/2020 1538   BILIRUBINUR Negative 02/01/2020 1429   KETONESUR NEGATIVE 07/14/2020 1538   PROTEINUR 100 (A) 07/14/2020 1538   NITRITE POSITIVE (A) 07/14/2020 1538   LEUKOCYTESUR SMALL (A) 07/14/2020 1538   Sepsis Labs Invalid input(s): PROCALCITONIN,  WBC,  LACTICIDVEN Microbiology Recent Results (from the past 240 hour(s))  Blood culture (routine x 2)     Status: None (Preliminary result)   Collection Time: 07/14/20  5:33 PM   Specimen: BLOOD  Result Value Ref Range Status   Specimen Description BLOOD BLOOD LEFT HAND  Final    Special Requests   Final    BOTTLES DRAWN AEROBIC AND ANAEROBIC Blood Culture adequate volume   Culture   Final    NO GROWTH < 24 HOURS Performed at [facility], [address] [place], [place] [address]    Report Status PENDING  Incomplete  Blood culture (routine x 2)     Status: None (Preliminary result)   Collection Time: 07/14/20  5:37 PM   Specimen: BLOOD  Result Value Ref Range Status   Specimen Description BLOOD BLOOD RIGHT HAND  Final   Special Requests   Final    BOTTLES DRAWN AEROBIC AND ANAEROBIC Blood Culture adequate volume   Culture   Final    NO GROWTH < 24 HOURS Performed at [facility], [address] [place], [place] [address]    Report Status PENDING  Incomplete  Respiratory Panel by RT PCR (Flu A&B, Covid) - Nasopharyngeal Swab     Status: None   Collection Time: 07/14/20  8:10 PM   Specimen: Nasopharyngeal Swab  Result Value Ref Range Status   SARS Coronavirus 2 by RT PCR NEGATIVE NEGATIVE Final    Comment: (NOTE) SARS-CoV-2 target nucleic acids are NOT DETECTED.  The SARS-CoV-2 RNA is generally detectable in upper respiratoy specimens during the acute phase of infection. The lowest concentration of SARS-CoV-2 viral copies this assay can detect is 131 copies/mL. A negative result does not preclude SARS-Cov-2 infection and should not be used as the sole basis for treatment or other patient management decisions. A negative result may occur with  improper specimen collection/handling, submission of specimen other than nasopharyngeal swab, presence of viral mutation(s) within the areas targeted by this assay, and inadequate number of viral copies (<131 copies/mL). A negative result must be combined with clinical observations, patient history, and epidemiological information. The expected result is Negative.  Fact Sheet for Patients:  PinkCheek.be  Fact Sheet for Healthcare Providers:   GravelBags.it  This test is no t yet approved or cleared by the [place] FDA and  has been authorized for detection and/or diagnosis of SARS-CoV-2 by FDA under an Emergency Use Authorization (EUA). This EUA will remain  in effect (meaning this test can be used) for the duration of the COVID-19 declaration under Section 564(b)(1) of the Act, 21 U.S.C. section 360bbb-3(b)(1), unless the authorization is terminated or revoked sooner.     Influenza A by PCR NEGATIVE NEGATIVE Final   Influenza B by PCR NEGATIVE NEGATIVE Final    Comment: (NOTE) The Xpert Xpress SARS-CoV-2/FLU/RSV assay is intended as an aid in  the diagnosis of influenza from Nasopharyngeal swab specimens and  should not be used as a sole basis for treatment. Nasal washings and  aspirates are unacceptable for Xpert Xpress SARS-CoV-2/FLU/RSV  testing.  Fact Sheet for Patients: PinkCheek.be  Fact Sheet for Healthcare Providers: GravelBags.it  This test is not yet approved or cleared by the [place] FDA and  has been authorized for detection and/or diagnosis of SARS-CoV-2 by  FDA under an Emergency Use Authorization (EUA). This EUA will remain  in effect (meaning this test can be used) for the duration of the  Covid-19 declaration under Section 564(b)(1) of the Act, 21  U.S.C. section 360bbb-3(b)(1), unless the authorization is  terminated or revoked. Performed at [facility], [address] [place], [place] [address]      Total time spend on discharging this patient, including the last patient exam, discussing the hospital stay, instructions for ongoing care as it relates to all pertinent caregivers, as well as preparing the medical discharge records, prescriptions, and/or referrals as applicable, is 50 minutes.    Enzo Bi, MD  [facility] 07/15/2020, 2:48 PM  If 7PM-7AM, please contact  night-coverage

## 2020-07-15 NOTE — Progress Notes (Signed)
Inpatient Diabetes Program Recommendations  AACE/ADA: New Consensus Statement on Inpatient Glycemic Control (2015)  Target Ranges:  Prepandial:   less than 140 mg/dL      Peak postprandial:   less than 180 mg/dL (1-2 hours)      Critically ill patients:  140 - 180 mg/dL   Lab Results  Component Value Date   GLUCAP 142 (H) 07/15/2020   HGBA1C 10.1 (H) 07/14/2020    Review of Glycemic Control  Diabetes history: DM1 Outpatient Diabetes medications: Tresiba 50 units QD, Novolog 18-20-22 units tid (not taking). Current orders for Inpatient glycemic control: Novolog 0-15 units tidwc and 0-5 units QHS  Uses Freestyle Libre HgbA1C - 10.1%. Down from 10.5% in 01/2020.  Inpatient Diabetes Program Recommendations:     Add Lantus 25 units QD Add Novolog 6 units tidwc for meal coverage insulin if eating > 50%.  Will attempt to speak with pt about why he's not taking rapid-acting insulin at home.  Continue to follow.   Thank you. Lorenda Peck, RD, LDN, CDE Inpatient Diabetes Coordinator [phone]

## 2020-07-15 NOTE — ED Notes (Signed)
Pt discharged home after verbalizing understanding of discharge instructions; nad noted. 

## 2020-07-16 LAB — URINE CULTURE: Culture: >=100000 — AB

## 2020-07-17 DIAGNOSIS — L97509 Non-pressure chronic ulcer of other part of unspecified foot with unspecified severity: Secondary | ICD-10-CM

## 2020-07-17 DIAGNOSIS — E10621 Type 1 diabetes mellitus with foot ulcer: Secondary | ICD-10-CM

## 2020-07-17 DIAGNOSIS — E1065 Type 1 diabetes mellitus with hyperglycemia: Secondary | ICD-10-CM

## 2020-07-21 LAB — CULTURE, BLOOD (ROUTINE X 2)
Culture: NO GROWTH
Culture: NO GROWTH
Special Requests: ADEQUATE
Special Requests: ADEQUATE

## 2020-09-19 ENCOUNTER — Encounter: Payer: Self-pay | Admitting: Emergency Medicine

## 2020-09-19 ENCOUNTER — Inpatient Hospital Stay
Admission: EM | Admit: 2020-09-19 | Discharge: 2020-09-24 | DRG: 177 | Disposition: A | Discharge status: Home / Self Care | Payer: HRSA Program | Attending: Internal Medicine | Admitting: Internal Medicine

## 2020-09-19 ENCOUNTER — Other Ambulatory Visit: Payer: Self-pay

## 2020-09-19 ENCOUNTER — Emergency Department: Payer: PRIVATE HEALTH INSURANCE

## 2020-09-19 DIAGNOSIS — I252 Old myocardial infarction: Secondary | ICD-10-CM

## 2020-09-19 DIAGNOSIS — J45909 Unspecified asthma, uncomplicated: Secondary | ICD-10-CM | POA: Diagnosis present

## 2020-09-19 DIAGNOSIS — U071 COVID-19: Secondary | ICD-10-CM | POA: Diagnosis not present

## 2020-09-19 DIAGNOSIS — A0839 Other viral enteritis: Secondary | ICD-10-CM | POA: Diagnosis present

## 2020-09-19 DIAGNOSIS — E11621 Type 2 diabetes mellitus with foot ulcer: Secondary | ICD-10-CM | POA: Diagnosis present

## 2020-09-19 DIAGNOSIS — T380X5A Adverse effect of glucocorticoids and synthetic analogues, initial encounter: Secondary | ICD-10-CM | POA: Diagnosis not present

## 2020-09-19 DIAGNOSIS — I110 Hypertensive heart disease with heart failure: Secondary | ICD-10-CM | POA: Diagnosis present

## 2020-09-19 DIAGNOSIS — Z7982 Long term (current) use of aspirin: Secondary | ICD-10-CM

## 2020-09-19 DIAGNOSIS — Z7951 Long term (current) use of inhaled steroids: Secondary | ICD-10-CM

## 2020-09-19 DIAGNOSIS — E119 Type 2 diabetes mellitus without complications: Secondary | ICD-10-CM | POA: Diagnosis not present

## 2020-09-19 DIAGNOSIS — Z95810 Presence of automatic (implantable) cardiac defibrillator: Secondary | ICD-10-CM | POA: Diagnosis present

## 2020-09-19 DIAGNOSIS — Z881 Allergy status to other antibiotic agents status: Secondary | ICD-10-CM

## 2020-09-19 DIAGNOSIS — I5022 Chronic systolic (congestive) heart failure: Secondary | ICD-10-CM | POA: Diagnosis not present

## 2020-09-19 DIAGNOSIS — E1165 Type 2 diabetes mellitus with hyperglycemia: Secondary | ICD-10-CM | POA: Diagnosis present

## 2020-09-19 DIAGNOSIS — E860 Dehydration: Secondary | ICD-10-CM | POA: Diagnosis present

## 2020-09-19 DIAGNOSIS — I255 Ischemic cardiomyopathy: Secondary | ICD-10-CM | POA: Diagnosis present

## 2020-09-19 DIAGNOSIS — I10 Essential (primary) hypertension: Secondary | ICD-10-CM | POA: Diagnosis present

## 2020-09-19 DIAGNOSIS — I2510 Atherosclerotic heart disease of native coronary artery without angina pectoris: Secondary | ICD-10-CM | POA: Diagnosis present

## 2020-09-19 DIAGNOSIS — J189 Pneumonia, unspecified organism: Secondary | ICD-10-CM

## 2020-09-19 DIAGNOSIS — G40909 Epilepsy, unspecified, not intractable, without status epilepticus: Secondary | ICD-10-CM | POA: Diagnosis present

## 2020-09-19 DIAGNOSIS — Z9049 Acquired absence of other specified parts of digestive tract: Secondary | ICD-10-CM

## 2020-09-19 DIAGNOSIS — Z8249 Family history of ischemic heart disease and other diseases of the circulatory system: Secondary | ICD-10-CM

## 2020-09-19 DIAGNOSIS — J1282 Pneumonia due to coronavirus disease 2019: Secondary | ICD-10-CM | POA: Diagnosis present

## 2020-09-19 DIAGNOSIS — I509 Heart failure, unspecified: Secondary | ICD-10-CM

## 2020-09-19 DIAGNOSIS — Y92239 Unspecified place in hospital as the place of occurrence of the external cause: Secondary | ICD-10-CM | POA: Diagnosis not present

## 2020-09-19 DIAGNOSIS — L97509 Non-pressure chronic ulcer of other part of unspecified foot with unspecified severity: Secondary | ICD-10-CM | POA: Diagnosis present

## 2020-09-19 DIAGNOSIS — Z794 Long term (current) use of insulin: Secondary | ICD-10-CM

## 2020-09-19 DIAGNOSIS — I420 Dilated cardiomyopathy: Secondary | ICD-10-CM | POA: Diagnosis present

## 2020-09-19 DIAGNOSIS — G4733 Obstructive sleep apnea (adult) (pediatric): Secondary | ICD-10-CM | POA: Diagnosis present

## 2020-09-19 DIAGNOSIS — Z79899 Other long term (current) drug therapy: Secondary | ICD-10-CM

## 2020-09-19 LAB — COMPREHENSIVE METABOLIC PANEL
ALT: 27 U/L (ref 0–44)
AST: 32 U/L (ref 15–41)
Albumin: 3.3 g/dL — ABNORMAL LOW (ref 3.5–5.0)
Alkaline Phosphatase: 102 U/L (ref 38–126)
Anion gap: 13 (ref 5–15)
BUN: 25 mg/dL — ABNORMAL HIGH (ref 6–20)
CO2: 21 mmol/L — ABNORMAL LOW (ref 22–32)
Calcium: 8.6 mg/dL — ABNORMAL LOW (ref 8.9–10.3)
Chloride: 101 mmol/L (ref 98–111)
Creatinine, Ser: 1.14 mg/dL (ref 0.61–1.24)
GFR, Estimated: >60 mL/min (ref >60)
Glucose, Bld: 288 mg/dL — ABNORMAL HIGH (ref 70–99)
Potassium: 4.3 mmol/L (ref 3.5–5.1)
Sodium: 135 mmol/L (ref 135–145)
Total Bilirubin: 1 mg/dL (ref 0.3–1.2)
Total Protein: 7.3 g/dL (ref 6.5–8.1)

## 2020-09-19 LAB — RESP PANEL BY RT-PCR (FLU A&B, COVID) ARPGX2
Influenza A by PCR: NEGATIVE
Influenza B by PCR: NEGATIVE
SARS Coronavirus 2 by RT PCR: POSITIVE — AB

## 2020-09-19 LAB — CBC
HCT: 44.2 % (ref 39.0–52.0)
Hemoglobin: 15.7 g/dL (ref 13.0–17.0)
MCH: 31.1 pg (ref 26.0–34.0)
MCHC: 35.5 g/dL (ref 30.0–36.0)
MCV: 87.5 fL (ref 80.0–100.0)
Platelets: 146 10*3/uL — ABNORMAL LOW (ref 150–400)
RBC: 5.05 MIL/uL (ref 4.22–5.81)
RDW: 12.3 % (ref 11.5–15.5)
WBC: 5.2 10*3/uL (ref 4.0–10.5)
nRBC: 0 % (ref 0.0–0.2)

## 2020-09-19 LAB — URINALYSIS, COMPLETE (UACMP) WITH MICROSCOPIC
Bilirubin Urine: NEGATIVE
Glucose, UA: >=500 mg/dL — AB
Ketones, ur: 80 mg/dL — AB
Leukocytes,Ua: NEGATIVE
Nitrite: NEGATIVE
Protein, ur: >=300 mg/dL — AB
Specific Gravity, Urine: 1.031 — ABNORMAL HIGH (ref 1.005–1.030)
pH: 5 (ref 5.0–8.0)

## 2020-09-19 LAB — BETA-HYDROXYBUTYRIC ACID: Beta-Hydroxybutyric Acid: 2.07 mmol/L — ABNORMAL HIGH (ref 0.05–0.27)

## 2020-09-19 LAB — CBG MONITORING, ED: Glucose-Capillary: 296 mg/dL — ABNORMAL HIGH (ref 70–99)

## 2020-09-19 LAB — HEMOGLOBIN A1C
Hgb A1c MFr Bld: 9.5 % — ABNORMAL HIGH (ref 4.8–5.6)
Mean Plasma Glucose: 225.95 mg/dL

## 2020-09-19 LAB — GLUCOSE, CAPILLARY: Glucose-Capillary: 266 mg/dL — ABNORMAL HIGH (ref 70–99)

## 2020-09-19 LAB — LIPASE, BLOOD: Lipase: 18 U/L (ref 11–51)

## 2020-09-19 LAB — PROCALCITONIN: Procalcitonin: 0.12 ng/mL

## 2020-09-19 MED ORDER — SODIUM CHLORIDE 0.9 % IV SOLN
500.0000 mg | INTRAVENOUS | Status: DC
  Administered 2020-09-19 – 2020-09-20 (×2): 500 mg via INTRAVENOUS
  Filled 2020-09-19 (×3): qty 500

## 2020-09-19 MED ORDER — ONDANSETRON HCL 4 MG/2ML IJ SOLN
4.0000 mg | Freq: Once | INTRAMUSCULAR | Status: AC
  Administered 2020-09-19: 4 mg via INTRAVENOUS
  Filled 2020-09-19: qty 2

## 2020-09-19 MED ORDER — ENOXAPARIN SODIUM 40 MG/0.4ML ~~LOC~~ SOLN
40.0000 mg | SUBCUTANEOUS | Status: DC
  Administered 2020-09-19 – 2020-09-23 (×5): 40 mg via SUBCUTANEOUS
  Filled 2020-09-19 (×5): qty 0.4

## 2020-09-19 MED ORDER — GUAIFENESIN-DM 100-10 MG/5ML PO SYRP
10.0000 mL | ORAL_SOLUTION | ORAL | Status: DC | PRN
  Administered 2020-09-20 – 2020-09-24 (×6): 10 mL via ORAL
  Filled 2020-09-19 (×5): qty 10

## 2020-09-19 MED ORDER — ONDANSETRON HCL 4 MG/2ML IJ SOLN
4.0000 mg | Freq: Four times a day (QID) | INTRAMUSCULAR | Status: DC | PRN
  Administered 2020-09-21 – 2020-09-22 (×2): 4 mg via INTRAVENOUS
  Filled 2020-09-19 (×2): qty 2

## 2020-09-19 MED ORDER — SODIUM CHLORIDE 0.9 % IV SOLN: Freq: Once | INTRAVENOUS | Status: DC

## 2020-09-19 MED ORDER — SODIUM CHLORIDE 0.9% FLUSH
3.0000 mL | INTRAVENOUS | Status: DC | PRN
  Administered 2020-09-21: 09:00:00 3 mL via INTRAVENOUS

## 2020-09-19 MED ORDER — ASPIRIN EC 81 MG PO TBEC
81.0000 mg | DELAYED_RELEASE_TABLET | ORAL | Status: DC
  Administered 2020-09-20 – 2020-09-24 (×5): 81 mg via ORAL
  Filled 2020-09-19 (×5): qty 1

## 2020-09-19 MED ORDER — ONDANSETRON HCL 4 MG PO TABS
4.0000 mg | ORAL_TABLET | Freq: Four times a day (QID) | ORAL | Status: DC | PRN
  Administered 2020-09-23: 4 mg via ORAL
  Filled 2020-09-19: qty 1

## 2020-09-19 MED ORDER — FLUTICASONE PROPIONATE 50 MCG/ACT NA SUSP
2.0000 | Freq: Every day | NASAL | Status: DC | PRN
  Filled 2020-09-19: qty 16

## 2020-09-19 MED ORDER — VITAMIN D 25 MCG (1000 UNIT) PO TABS
1000.0000 [IU] | ORAL_TABLET | Freq: Every day | ORAL | Status: DC
  Administered 2020-09-20 – 2020-09-24 (×5): 1000 [IU] via ORAL
  Filled 2020-09-19 (×6): qty 1

## 2020-09-19 MED ORDER — FUROSEMIDE 20 MG PO TABS
20.0000 mg | ORAL_TABLET | Freq: Two times a day (BID) | ORAL | Status: DC
  Administered 2020-09-19 – 2020-09-23 (×8): 20 mg via ORAL
  Filled 2020-09-19 (×8): qty 1

## 2020-09-19 MED ORDER — DIPHENHYDRAMINE HCL 50 MG/ML IJ SOLN: 50.0000 mg | Freq: Once | INTRAMUSCULAR | Status: DC | PRN

## 2020-09-19 MED ORDER — ALBUTEROL SULFATE HFA 108 (90 BASE) MCG/ACT IN AERS
2.0000 | INHALATION_SPRAY | Freq: Four times a day (QID) | RESPIRATORY_TRACT | Status: DC
  Administered 2020-09-19 – 2020-09-24 (×19): 2 via RESPIRATORY_TRACT
  Filled 2020-09-19 (×2): qty 6.7

## 2020-09-19 MED ORDER — CARVEDILOL 6.25 MG PO TABS
12.5000 mg | ORAL_TABLET | Freq: Every day | ORAL | Status: DC
  Administered 2020-09-20 – 2020-09-24 (×5): 12.5 mg via ORAL
  Filled 2020-09-19: qty 2
  Filled 2020-09-19: qty 1
  Filled 2020-09-19 (×3): qty 2

## 2020-09-19 MED ORDER — FAMOTIDINE IN NACL 20-0.9 MG/50ML-% IV SOLN
20.0000 mg | Freq: Once | INTRAVENOUS | Status: AC | PRN
  Administered 2020-09-19: 20 mg via INTRAVENOUS
  Filled 2020-09-19: qty 50

## 2020-09-19 MED ORDER — SODIUM CHLORIDE 0.9% FLUSH
3.0000 mL | Freq: Two times a day (BID) | INTRAVENOUS | Status: DC
  Administered 2020-09-19 – 2020-09-24 (×11): 3 mL via INTRAVENOUS

## 2020-09-19 MED ORDER — INSULIN ASPART 100 UNIT/ML ~~LOC~~ SOLN: 0.0000 [IU] | Freq: Three times a day (TID) | SUBCUTANEOUS | Status: DC

## 2020-09-19 MED ORDER — SODIUM CHLORIDE 0.9 % IV SOLN: INTRAVENOUS | Status: DC | PRN

## 2020-09-19 MED ORDER — ATORVASTATIN CALCIUM 20 MG PO TABS
40.0000 mg | ORAL_TABLET | Freq: Every evening | ORAL | Status: DC
  Administered 2020-09-19 – 2020-09-23 (×5): 40 mg via ORAL
  Filled 2020-09-19 (×5): qty 2

## 2020-09-19 MED ORDER — DICYCLOMINE HCL 10 MG PO CAPS
20.0000 mg | ORAL_CAPSULE | Freq: Four times a day (QID) | ORAL | Status: DC | PRN
  Administered 2020-09-19 – 2020-09-21 (×2): 20 mg via ORAL
  Filled 2020-09-19 (×3): qty 2

## 2020-09-19 MED ORDER — INSULIN DETEMIR 100 UNIT/ML ~~LOC~~ SOLN
0.1000 [IU]/kg | Freq: Two times a day (BID) | SUBCUTANEOUS | Status: DC
  Filled 2020-09-19 (×2): qty 0.09

## 2020-09-19 MED ORDER — SODIUM CHLORIDE 0.9 % IV BOLUS
1000.0000 mL | Freq: Once | INTRAVENOUS | Status: AC
  Administered 2020-09-19: 1000 mL via INTRAVENOUS

## 2020-09-19 MED ORDER — ALBUTEROL SULFATE HFA 108 (90 BASE) MCG/ACT IN AERS
2.0000 | INHALATION_SPRAY | RESPIRATORY_TRACT | Status: DC | PRN
  Filled 2020-09-19: qty 6.7

## 2020-09-19 MED ORDER — DEXAMETHASONE SODIUM PHOSPHATE 10 MG/ML IJ SOLN: 6.0000 mg | INTRAMUSCULAR | Status: DC

## 2020-09-19 MED ORDER — SODIUM CHLORIDE 0.9 % IV SOLN
200.0000 mg | Freq: Once | INTRAVENOUS | Status: DC
  Filled 2020-09-19: qty 40

## 2020-09-19 MED ORDER — INSULIN ASPART 100 UNIT/ML ~~LOC~~ SOLN
0.0000 [IU] | Freq: Three times a day (TID) | SUBCUTANEOUS | Status: DC
  Administered 2020-09-19 – 2020-09-20 (×3): 8 [IU] via SUBCUTANEOUS
  Filled 2020-09-19 (×3): qty 1

## 2020-09-19 MED ORDER — ASCORBIC ACID 500 MG PO TABS
500.0000 mg | ORAL_TABLET | Freq: Every day | ORAL | Status: DC
  Administered 2020-09-19 – 2020-09-24 (×6): 500 mg via ORAL
  Filled 2020-09-19 (×7): qty 1

## 2020-09-19 MED ORDER — SODIUM CHLORIDE 0.9 % IV SOLN
1200.0000 mg | Freq: Once | INTRAVENOUS | Status: DC
  Filled 2020-09-19: qty 10

## 2020-09-19 MED ORDER — INSULIN DETEMIR 100 UNIT/ML ~~LOC~~ SOLN
15.0000 [IU] | Freq: Every day | SUBCUTANEOUS | Status: DC
  Administered 2020-09-19: 15 [IU] via SUBCUTANEOUS
  Filled 2020-09-19 (×2): qty 0.15

## 2020-09-19 MED ORDER — SODIUM CHLORIDE 0.9 % IV SOLN: 250.0000 mL | INTRAVENOUS | Status: DC | PRN

## 2020-09-19 MED ORDER — ZINC SULFATE 220 (50 ZN) MG PO CAPS
220.0000 mg | ORAL_CAPSULE | Freq: Every day | ORAL | Status: DC
  Administered 2020-09-19 – 2020-09-24 (×6): 220 mg via ORAL
  Filled 2020-09-19 (×6): qty 1

## 2020-09-19 MED ORDER — ACETAMINOPHEN 325 MG PO TABS
650.0000 mg | ORAL_TABLET | Freq: Four times a day (QID) | ORAL | Status: DC | PRN
  Administered 2020-09-20 – 2020-09-22 (×4): 650 mg via ORAL
  Filled 2020-09-19 (×4): qty 2

## 2020-09-19 MED ORDER — METHYLPREDNISOLONE SODIUM SUCC 125 MG IJ SOLR: 125.0000 mg | Freq: Once | INTRAMUSCULAR | Status: DC | PRN

## 2020-09-19 MED ORDER — DIGOXIN 125 MCG PO TABS
125.0000 ug | ORAL_TABLET | Freq: Every day | ORAL | Status: DC
  Administered 2020-09-20 – 2020-09-24 (×5): 125 ug via ORAL
  Filled 2020-09-19 (×6): qty 1

## 2020-09-19 MED ORDER — SPIRONOLACTONE 25 MG PO TABS
12.5000 mg | ORAL_TABLET | Freq: Every day | ORAL | Status: DC
  Administered 2020-09-20 – 2020-09-23 (×4): 12.5 mg via ORAL
  Filled 2020-09-19: qty 1
  Filled 2020-09-19 (×2): qty 0.5
  Filled 2020-09-19: qty 1
  Filled 2020-09-19: qty 0.5
  Filled 2020-09-19 (×2): qty 1
  Filled 2020-09-19: qty 0.5

## 2020-09-19 MED ORDER — MORPHINE SULFATE (PF) 4 MG/ML IV SOLN
4.0000 mg | Freq: Once | INTRAVENOUS | Status: AC
  Administered 2020-09-19: 4 mg via INTRAVENOUS
  Filled 2020-09-19: qty 1

## 2020-09-19 MED ORDER — HYDROMORPHONE HCL 1 MG/ML IJ SOLN
1.0000 mg | INTRAMUSCULAR | Status: AC
  Administered 2020-09-19: 1 mg via INTRAVENOUS
  Filled 2020-09-19: qty 1

## 2020-09-19 MED ORDER — EPINEPHRINE 0.3 MG/0.3ML IJ SOAJ
0.3000 mg | Freq: Once | INTRAMUSCULAR | Status: DC | PRN
  Filled 2020-09-19: qty 0.3

## 2020-09-19 MED ORDER — ACETAMINOPHEN 500 MG PO TABS: 500.0000 mg | ORAL_TABLET | Freq: Four times a day (QID) | ORAL | Status: DC | PRN

## 2020-09-19 MED ORDER — LORATADINE 10 MG PO TABS
10.0000 mg | ORAL_TABLET | Freq: Every day | ORAL | Status: DC
  Administered 2020-09-20 – 2020-09-24 (×5): 10 mg via ORAL
  Filled 2020-09-19 (×6): qty 1

## 2020-09-19 MED ORDER — POTASSIUM CHLORIDE CRYS ER 10 MEQ PO TBCR
10.0000 meq | EXTENDED_RELEASE_TABLET | Freq: Every day | ORAL | Status: DC
  Administered 2020-09-20 – 2020-09-24 (×5): 10 meq via ORAL
  Filled 2020-09-19 (×5): qty 1

## 2020-09-19 MED ORDER — LOSARTAN POTASSIUM 25 MG PO TABS
100.0000 mg | ORAL_TABLET | Freq: Every day | ORAL | Status: DC
  Administered 2020-09-19 – 2020-09-23 (×5): 100 mg via ORAL
  Filled 2020-09-19 (×6): qty 4

## 2020-09-19 MED ORDER — SODIUM CHLORIDE 0.9 % IV SOLN
100.0000 mg | Freq: Every day | INTRAVENOUS | Status: DC
  Filled 2020-09-19: qty 20

## 2020-09-19 MED ORDER — SODIUM CHLORIDE 0.9 % IV SOLN
2.0000 g | INTRAVENOUS | Status: DC
  Administered 2020-09-19 – 2020-09-23 (×5): 2 g via INTRAVENOUS
  Filled 2020-09-19 (×2): qty 20
  Filled 2020-09-19: qty 2
  Filled 2020-09-19: qty 20
  Filled 2020-09-19: qty 2
  Filled 2020-09-19: qty 20
  Filled 2020-09-19: qty 2
  Filled 2020-09-19: qty 20

## 2020-09-19 NOTE — ED Triage Notes (Signed)
Pt to ED via POV with c/o nausea/diarrhea/cough/body aches/chills x 4 days. Pt states did note some streaks of blood in phlegm, otherwise yellow/green phlegm.   Pt states waiting for results of Covid test from [facility] in [place].

## 2020-09-19 NOTE — H&P (Addendum)
History and Physical    RICK CARRUTHERS DJS:[id number] DOB: 1970-10-19 DOA: 09/19/2020  PCP: [facility]   Patient coming from: Home  I have personally briefly reviewed patient's old medical records in [place]  Chief Complaint: Cough                                Diarrhea  HPI: Joseph [name] is a 49 y.o. male with medical history significant for insulin-dependent diabetes mellitus, seizure disorder, hypertension, chronic systolic heart failure with a last known LVEF of 25 to 30%, ischemic cardiomyopathy status post AICD insertion who presents to the ER for evaluation of a 5 to 6-day history of myalgias, fatigue, nausea, decreased oral intake, abdominal pain, nonproductive cough, fever, chills and diarrhea.  Patient states that his symptoms have continued to worsen and that over the last couple of days he has been unable to tolerate any oral intake. Patient received 2 doses of the Moderna vaccine but has not received his booster shot. He denies having any chest pain, no urinary symptoms, no dizziness or lightheadedness. Labs show sodium 135, potassium 4.3, chloride 101, bicarb 21, glucose 288, BUN 25, creatinine 1.14, calcium 8.6, alkaline phosphatase 102, albumin 3.3, lipase 18, AST 32, ALT 27, total protein 7.3, procalcitonin 0.12, white count 5.2, hemoglobin 15.7, hematocrit 44.2, MCV 87.5, RDW 12.3, platelet count 146, beta hydroxybutyric acid 2.07 His SARS coronavirus 2 PCR test is positive Chest x-ray shows acute perihilar bronchopneumonia in the right upper lobe   ED Course: Patient is a 49 year old African-American male with a history of insulin-dependent diabetes mellitus, ischemic dilated cardiomyopathy status post AICD insertion, chronic systolic heart failure who presents to the ER for evaluation of multiple symptoms.  His SARS coronavirus 2 PCR test is positive.  Chest x-ray shows findings suggestive of bronchopneumonia in the right upper lobe.  Patient  received a dose of Rocephin and azithromycin in the emergency room and will be admitted to the hospital for further evaluation.  Review of Systems: As per HPI otherwise 10 point review of systems negative.    Past Medical History:  Diagnosis Date  . AICD (automatic cardioverter/defibrillator) present 2011  . Asthma   . Cancer [facility])    prostate  . CHF (congestive heart failure) ([place])   . Coronary artery disease   . Diabetes mellitus without complication ([place])   . Dyspnea   . ED (erectile dysfunction)   . GERD (gastroesophageal reflux disease)   . Hyperlipidemia   . Hypertension   . Ischemic cardiomyopathy   . LADA (latent autoimmune diabetes in adults), managed as type 1 ([place])   . Myocardial infarction [facility]) 2011   anterior MI at [place] s/p BMS to ostial/proximal LAD  . Neuromuscular disorder ([place])    problems in arms. finger tips have been numb/tingling. voltaren works  . Obstructive sleep apnea 2018   cpap. has not used lately. uses a breathe-right strip on nose  . Seizures ([place])    diabetes seizures (low blood sugar); last one 08/2018  . Systolic heart failure ([place])   . Vitamin D deficiency     Past Surgical History:  Procedure Laterality Date  . CARDIAC DEFIBRILLATOR PLACEMENT  08/30/2010  . CHOLECYSTECTOMY N/A 11/07/2017   Procedure: LAPAROSCOPIC CHOLECYSTECTOMY;  Surgeon: Herbert Pun, MD;  Location: [facility] ORS;  Service: General;  Laterality: N/A;  . CORONARY ANGIOPLASTY  2011   stent placed x 1  .  CYSTOSCOPY N/A 01/03/2020   Procedure: CYSTOSCOPY FLEXIBLE;  Surgeon: Hollice Espy, MD;  Location: [facility] ORS;  Service: Urology;  Laterality: N/A;  . ESOPHAGOGASTRODUODENOSCOPY (EGD) WITH PROPOFOL N/A 03/20/2018   Procedure: ESOPHAGOGASTRODUODENOSCOPY (EGD) WITH PROPOFOL;  Surgeon: Manya Silvas, MD;  Location: [facility] ENDOSCOPY;  Service: Endoscopy;  Laterality: N/A;  . LEFT HEART CATH AND CORONARY ANGIOGRAPHY N/A 11/23/2016   Procedure: Left Heart Cath and Coronary  Angiography;  Surgeon: Nelva Bush, MD;  Location: [facility] CV LAB;  Service: Cardiovascular;  Laterality: N/A;  . PROSTATE BIOPSY  2019  . PROSTATE BIOPSY N/A 10/23/2019   Procedure: PROSTATE BIOPSY;  Surgeon: Abbie Sons, MD;  Location: [facility] ORS;  Service: Urology;  Laterality: N/A;  . ROBOT ASSISTED LAPAROSCOPIC RADICAL PROSTATECTOMY N/A 01/03/2020   Procedure: XI ROBOTIC ASSISTED LAPAROSCOPIC RADICAL PROSTATECTOMY;  Surgeon: Hollice Espy, MD;  Location: [facility] ORS;  Service: Urology;  Laterality: N/A;  . TRANSRECTAL ULTRASOUND N/A 10/23/2019   Procedure: TRANSRECTAL ULTRASOUND;  Surgeon: Abbie Sons, MD;  Location: [facility] ORS;  Service: Urology;  Laterality: N/A;  . WRIST SURGERY Right 2008     reports that he has never smoked. He has never used smokeless tobacco. He reports that he does not drink alcohol and does not use drugs.  Allergies  Allergen Reactions  . Vancomycin Shortness Of Breath  . Lisinopril Rash    Family History  Problem Relation Age of Onset  . Hypertension Mother   . Heart attack Paternal Grandmother   . Heart attack Father   . Bladder Cancer Neg Hx   . Kidney cancer Neg Hx   . Prostate cancer Neg Hx      Prior to Admission medications   Medication Sig Start Date End Date Taking? Authorizing Provider  acetaminophen (TYLENOL) 500 MG tablet Take 500 mg by mouth every 6 (six) hours as needed for fever.    [provider]  albuterol (PROVENTIL HFA) 108 (90 Base) MCG/ACT inhaler Inhale 2 puffs into the lungs every 4 (four) hours as needed for wheezing or shortness of breath. 10/08/18   Carrie Mew, MD  aspirin EC 81 MG tablet Take 81 mg by mouth every morning.    [provider]  atorvastatin (LIPITOR) 40 MG tablet Take 40 mg by mouth daily. 06/08/20   [provider]  carvedilol (COREG) 12.5 MG tablet Take 12.5 mg by mouth daily. 05/28/20   [provider]  Cholecalciferol (D3-1000) 1000 units capsule Take  1,000 Units by mouth daily.    [provider]  Continuous Blood Gluc Receiver (FREESTYLE LIBRE 14 DAY READER) DEVI Use 1 Device continuously as needed 02/07/18   [provider]  Continuous Blood Gluc Sensor (FREESTYLE LIBRE 14 DAY SENSOR) MISC Use 1 Device every 14 (fourteen) days 02/07/18   [provider]  digoxin (LANOXIN) 0.125 MG tablet Take 1 tablet (125 mcg total) by mouth daily. 01/15/20   Lorella Nimrod, MD  fluticasone (FLONASE) 50 MCG/ACT nasal spray Place 2 sprays into both nostrils daily as needed for allergies.  01/26/16   [provider]  furosemide (LASIX) 20 MG tablet Take 1 tablet (20 mg total) by mouth 2 (two) times daily. 12/01/19   Carrie Mew, MD  glucagon 1 MG injection Inject into the muscle. 02/07/18   [provider]  glucagon, human recombinant, (GLUCAGEN DIAGNOSTIC) 1 MG injection Inject into the muscle. 02/07/18   [provider]  insulin aspart (NOVOLOG) 100 UNIT/ML FlexPen Inject 18-20-22 u TID AC plus sliding  scale for up to 75 units TDD Patient not taking: Reported on 07/15/2020 01/10/20   [provider]  insulin degludec (TRESIBA FLEXTOUCH) 200 UNIT/ML FlexTouch Pen Inject 50 Units into the skin daily.  01/29/20   [provider]  Insulin Pen Needle (B-D ULTRAFINE III SHORT PEN) 31G X 8 MM MISC USE 5 TIMES DAILY AS  DIRECTED 12/01/19   Carrie Mew, MD  KLOR-CON M10 10 MEQ tablet Take 10 mEq by mouth daily. 04/29/20   [provider]  loratadine (CLARITIN) 10 MG tablet Take 10 mg by mouth daily as needed.     [provider]  losartan (COZAAR) 100 MG tablet Take 1 tablet (100 mg total) by mouth at bedtime. 12/01/19   Carrie Mew, MD  naproxen sodium (ALEVE) 220 MG tablet Take 220 mg by mouth daily as needed.    [provider]  spironolactone (ALDACTONE) 25 MG tablet Take 0.5 tablets (12.5 mg total) by mouth daily. 01/15/20   Lorella Nimrod, MD  SYMBICORT 160-4.5  MCG/ACT inhaler Inhale 2 puffs into the lungs 2 (two) times daily. 05/27/20   [provider]    Physical Exam: Vitals:   09/19/20 1330 09/19/20 1417 09/19/20 1500 09/19/20 1530  BP: (!) 159/86 (!) 146/83 (!) 144/80 134/75  Pulse: (!) 102 96 99 95  Resp: 16 18 12  (!) 9  Temp:      TempSrc:      SpO2: 94% 97% 94% 95%  Weight:      Height:         Vitals:   09/19/20 1330 09/19/20 1417 09/19/20 1500 09/19/20 1530  BP: (!) 159/86 (!) 146/83 (!) 144/80 134/75  Pulse: (!) 102 96 99 95  Resp: 16 18 12  (!) 9  Temp:      TempSrc:      SpO2: 94% 97% 94% 95%  Weight:      Height:        Constitutional: NAD, alert and oriented x 3.  Acutely ill-appearing Eyes: PERRL, lids and conjunctivae normal ENMT: Mucous membranes are dry.  Neck: normal, supple, no masses, no thyromegaly Respiratory: Bilateral air entry, no wheezing, no crackles. Normal respiratory effort. No accessory muscle use.  Cardiovascular: Regular rate and rhythm, no murmurs / rubs / gallops. No extremity edema. 2+ pedal pulses. No carotid bruits.  Abdomen: no tenderness, no masses palpated. No hepatosplenomegaly. Bowel sounds positive.  Musculoskeletal: no clubbing / cyanosis. No joint deformity upper and lower extremities.  Skin: no rashes, lesions, ulcers.  Neurologic: No gross focal neurologic deficit.  Generalized weakness Psychiatric: Normal mood and affect.   Labs on Admission: I have personally reviewed following labs and imaging studies  CBC: Recent Labs  Lab 09/19/20 1017  WBC 5.2  HGB 15.7  HCT 44.2  MCV 87.5  PLT 308*   Basic Metabolic Panel: Recent Labs  Lab 09/19/20 1017  NA 135  K 4.3  CL 101  CO2 21*  GLUCOSE 288*  BUN 25*  CREATININE 1.14  CALCIUM 8.6*   GFR: Estimated Creatinine Clearance: 88.6 mL/min (by C-G formula based on SCr of 1.14 mg/dL). Liver Function Tests: Recent Labs  Lab 09/19/20 1017  AST 32  ALT 27  ALKPHOS 102  BILITOT 1.0  PROT 7.3  ALBUMIN 3.3*    Recent Labs  Lab 09/19/20 1017  LIPASE 18   No results for input(s): AMMONIA in the last 168 hours. Coagulation Profile: No results for input(s): INR, PROTIME in the last 168 hours. Cardiac Enzymes:  No results for input(s): CKTOTAL, CKMB, CKMBINDEX, TROPONINI in the last 168 hours. BNP (last 3 results) No results for input(s): PROBNP in the last 8760 hours. HbA1C: No results for input(s): HGBA1C in the last 72 hours. CBG: No results for input(s): GLUCAP in the last 168 hours. Lipid Profile: No results for input(s): CHOL, HDL, LDLCALC, TRIG, CHOLHDL, LDLDIRECT in the last 72 hours. Thyroid Function Tests: No results for input(s): TSH, T4TOTAL, FREET4, T3FREE, THYROIDAB in the last 72 hours. Anemia Panel: No results for input(s): VITAMINB12, FOLATE, FERRITIN, TIBC, IRON, RETICCTPCT in the last 72 hours. Urine analysis:    Component Value Date/Time   COLORURINE AMBER (A) 09/19/2020 1012   APPEARANCEUR HAZY (A) 09/19/2020 1012   APPEARANCEUR Cloudy (A) 02/01/2020 1429   LABSPEC 1.031 (H) 09/19/2020 1012   PHURINE 5.0 09/19/2020 1012   GLUCOSEU >=500 (A) 09/19/2020 1012   HGBUR SMALL (A) 09/19/2020 1012   BILIRUBINUR NEGATIVE 09/19/2020 1012   BILIRUBINUR Negative 02/01/2020 1429   KETONESUR 80 (A) 09/19/2020 1012   PROTEINUR >=300 (A) 09/19/2020 1012   NITRITE NEGATIVE 09/19/2020 1012   LEUKOCYTESUR NEGATIVE 09/19/2020 1012    Radiological Exams on Admission: DG Chest 2 View  Result Date: 09/19/2020 CLINICAL DATA:  Cough EXAM: CHEST - 2 VIEW COMPARISON:  07/14/2020 FINDINGS: Pacemaker/AICD appears unchanged. There is acute perihilar bronchopneumonia in the right upper lobe. Left lung is clear except for mild scarring or atelectasis at the base. No effusion. No significant bone finding. IMPRESSION: Acute perihilar bronchopneumonia in the right upper lobe. Electronically Signed   By: Nelson Chimes M.D.   On: 09/19/2020 10:48    EKG: Independently reviewed.    Assessment/Plan Principal Problem:   Pneumonia due to COVID-19 virus Active Problems:   Chronic systolic CHF (congestive heart failure) (HCC)   Hypertension   Implantable cardioverter-defibrillator (ICD) in situ   Ischemic cardiomyopathy   Diabetes mellitus without complication (HCC)    Bronchopneumonia In a patient with a positive COVID-19 PCR test Discussed with infectious disease physician who recommends to administer monoclonal antibody Discussed with patient the need for treatment with monoclonal antibody and he consents to receiving the monoclonal antibody Patient already received a dose of Rocephin and azithromycin in the emergency room  Procalcitonin levels are within normal limits Patient is not hypoxic at this time and at rest has room air pulse oximetry of 95% and with exertion room air pulse oximetry is remained in the upper 90s. We'll hold off on administering systemic steroids at this time    Chronic systolic CHF Last known LVEF of 25 to 30% Continue furosemide, spironolactone, carvedilol and losartan Maintain low-sodium diet   Diabetes mellitus Place patient on low-dose long-acting insulin with sliding scale coverage Accu-Cheks before meals and at bedtime Patient has had poor oral intake and has not been taking his insulin as prescribed He is at high risk of going into DKA    Ischemic cardiomyopathy Status post AICD insertion Continue carvedilol and losartan     DVT prophylaxis: Lovenox Code Status: Full code Family Communication: Greater than 50% of time was spent discussing plan of care with patient at the bedside.  All questions and concerns have been addressed.  He verbalizes understanding and agrees with the plan. Disposition Plan: Back to previous home environment Consults called: Infectious disease    Jantz Main MD [facility]     09/19/2020, 3:52 PM

## 2020-09-19 NOTE — ED Notes (Signed)
Tried to call report for the second time - was placed on hold. Purple man not in progress. Charge nurse alerted

## 2020-09-19 NOTE — ED Notes (Signed)
Pt ambulatory in room with pulse oximetry, pt maintained oxygen sats of 94-96% on room air. Md [place] notified.

## 2020-09-19 NOTE — Plan of Care (Signed)

## 2020-09-19 NOTE — Progress Notes (Signed)
Remdesivir - Pharmacy Brief Note   O:  ALT: 27 CXR: Acute perihilar bronchopneumonia in the right upper lobe. SpO2: 94-95% on RA   A/P:  Remdesivir 200 mg IVPB once followed by 100 mg IVPB daily x 4 days.   Pernell Dupre, PharmD, BCPS Clinical Pharmacist 09/19/2020 3:47 PM

## 2020-09-19 NOTE — ED Provider Notes (Signed)
[facility] Emergency Department Provider Note  ____________________________________________  Time seen: Approximately 1:08 PM  I have reviewed the triage vital signs and the nursing notes.   HISTORY  Chief Complaint Cough, Diarrhea (/), and Nausea    HPI Joseph [name] is a 49 y.o. male with a history of CHF status post AICD, diabetes on insulin, hypertension, seizure disorder who comes the ED complaining of multiple symptoms for the past 5 or 6 days including body aches, fatigue, nausea, decreased oral intake, diarrhea, chills.  The symptoms have worsened in the last 3 or 4 days he has not been able to eat at all.  He is also noticed development of cough productive of thick sputum.  Symptoms are constant, no aggravating or alleviating factors.  Also associated with 10/10 generalized abdominal pain which is crampy and nonradiating.  [facility] and had [facility] Cov[facility] test done, results pending.      Past Medical History:  Diagnosis Date  . AICD (automatic cardioverter/defibrillator) present 2011  . Asthma   . Cancer [facility])    prostate  . CHF (congestive heart failure) ([place])   . Coronary artery disease   . Diabetes mellitus without complication ([place])   . Dyspnea   . ED (erectile dysfunction)   . GERD (gastroesophageal reflux disease)   . Hyperlipidemia   . Hypertension   . Ischemic cardiomyopathy   . LADA (latent autoimmune diabetes in adults), managed as type 1 ([place])   . Myocardial infarction [facility]) 2011   anterior MI at [place] s/p BMS to ostial/proximal LAD  . Neuromuscular disorder ([place])    problems in arms. finger tips have been numb/tingling. voltaren works  . Obstructive sleep apnea 2018   cpap. has not used lately. uses a breathe-right strip on nose  . Seizures ([place])    diabetes seizures (low blood sugar); last one 08/2018  . Systolic heart failure ([place])   . Vitamin D deficiency      Patient Active Problem List   Diagnosis Date Noted  .  Hyperglycemia due to type 1 diabetes mellitus ([place]) 07/17/2020  . Diabetic foot ulcer associated with type 1 diabetes mellitus ([place]) 07/17/2020  . Cellulitis 07/14/2020  . Sepsis ([place]) 07/14/2020  . Cystitis   . Scrotal pain   . CAD (coronary artery disease) 01/12/2020  . S/P prostatectomy 01/12/2020  . Severe asthma without complication 62/12/5595  . UTI (urinary tract infection) 01/12/2020  . Possible Postprocedural intraabdominal abscess 01/12/2020  . Prostate cancer ([place]) 01/03/2020  . Preop cardiovascular exam 10/18/2019  . Abdominal bloating 04/12/2019  . Chronic diarrhea 04/12/2019  . Gastritis and duodenitis 04/12/2019  . 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 Elevated PSA 07/25/2017  . Erectile dysfunction associated with type 2 diabetes mellitus ([place]) 07/25/2017  . Hypertension 03/01/2017  . Obstructive sleep apnea on CPAP 03/01/2017  . Chronic systolic CHF (congestive heart failure) ([place]) 11/24/2016  . Shortness of breath   . Abnormal EKG   . DKA (diabetic ketoacidoses) 04/18/2016  . CAP (community acquired pneumonia) 04/18/2016  . Accelerated hypertension 04/18/2016  . Abdominal pain 04/18/2016  . 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[place]) 08/19/2015  . Poorly controlled type 1 diabetes mellitus ([place]) 08/07/2015  . Required emergent intubation 03/07/2014  . LADA (latent autoimmune diabetes in adults), managed as type 1 ([place]) 09/15/2011  . GERD without esophagitis 08/18/2011  . Hyperlipemia 08/18/2011  . Implantable cardioverter-defibrillator (ICD) in situ 08/18/2011  . Ischemic cardiomyopathy 08/18/2011  . MI (myocardial infarction) ([place]) 03/11/2010  Past Surgical History:  Procedure Laterality Date  . CARDIAC DEFIBRILLATOR PLACEMENT  08/30/2010  . CHOLECYSTECTOMY N/A 11/07/2017   Procedure: LAPAROSCOPIC CHOLECYSTECTOMY;  Surgeon: Herbert Pun, MD;  Location: [facility] ORS;  Service: General;  Laterality: N/A;  . CORONARY ANGIOPLASTY  2011   stent placed x 1  .  CYSTOSCOPY N/A 01/03/2020   Procedure: CYSTOSCOPY FLEXIBLE;  Surgeon: Hollice Espy, MD;  Location: [facility] ORS;  Service: Urology;  Laterality: N/A;  . ESOPHAGOGASTRODUODENOSCOPY (EGD) WITH PROPOFOL N/A 03/20/2018   Procedure: ESOPHAGOGASTRODUODENOSCOPY (EGD) WITH PROPOFOL;  Surgeon: Manya Silvas, MD;  Location: [facility] ENDOSCOPY;  Service: Endoscopy;  Laterality: N/A;  . LEFT HEART CATH AND CORONARY ANGIOGRAPHY N/A 11/23/2016   Procedure: Left Heart Cath and Coronary Angiography;  Surgeon: Nelva Bush, MD;  Location: [place] CV LAB;  Service: Cardiovascular;  Laterality: N/A;  . PROSTATE BIOPSY  2019  . PROSTATE BIOPSY N/A 10/23/2019   Procedure: PROSTATE BIOPSY;  Surgeon: Abbie Sons, MD;  Location: [facility] ORS;  Service: Urology;  Laterality: N/A;  . ROBOT ASSISTED LAPAROSCOPIC RADICAL PROSTATECTOMY N/A 01/03/2020   Procedure: XI ROBOTIC ASSISTED LAPAROSCOPIC RADICAL PROSTATECTOMY;  Surgeon: Hollice Espy, MD;  Location: [facility] ORS;  Service: Urology;  Laterality: N/A;  . TRANSRECTAL ULTRASOUND N/A 10/23/2019   Procedure: TRANSRECTAL ULTRASOUND;  Surgeon: Abbie Sons, MD;  Location: [facility] ORS;  Service: Urology;  Laterality: N/A;  . WRIST SURGERY Right 2008     Prior to Admission medications   Medication Sig Start Date End Date Taking? Authorizing Provider  acetaminophen (TYLENOL) 500 MG tablet Take 500 mg by mouth every 6 (six) hours as needed for fever.    [provider]  albuterol (PROVENTIL HFA) 108 (90 Base) MCG/ACT inhaler Inhale 2 puffs into the lungs every 4 (four) hours as needed for wheezing or shortness of breath. 10/08/18   Carrie Mew, MD  aspirin EC 81 MG tablet Take 81 mg by mouth every morning.    [provider]  atorvastatin (LIPITOR) 40 MG tablet Take 40 mg by mouth daily. 06/08/20   [provider]  carvedilol (COREG) 12.5 MG tablet Take 12.5 mg by mouth daily. 05/28/20   [provider]  Cholecalciferol (D3-1000)  1000 units capsule Take 1,000 Units by mouth daily.    [provider]  Continuous Blood Gluc Receiver (FREESTYLE LIBRE 14 DAY READER) DEVI Use 1 Device continuously as needed 02/07/18   [provider]  Continuous Blood Gluc Sensor (FREESTYLE LIBRE 14 DAY SENSOR) MISC Use 1 Device every 14 (fourteen) days 02/07/18   [provider]  digoxin (LANOXIN) 0.125 MG tablet Take 1 tablet (125 mcg total) by mouth daily. 01/15/20   Lorella Nimrod, MD  fluticasone (FLONASE) 50 MCG/ACT nasal spray Place 2 sprays into both nostrils daily as needed for allergies.  01/26/16   [provider]  furosemide (LASIX) 20 MG tablet Take 1 tablet (20 mg total) by mouth 2 (two) times daily. 12/01/19   Carrie Mew, MD  glucagon 1 MG injection Inject into the muscle. 02/07/18   [provider]  glucagon, human recombinant, (GLUCAGEN DIAGNOSTIC) 1 MG injection Inject into the muscle. 02/07/18   [provider]  insulin aspart (NOVOLOG) 100 UNIT/ML FlexPen Inject 18-20-22 u TID AC plus sliding scale for up to 75 units TDD Patient not taking: Reported on 07/15/2020 01/10/20   [provider]  insulin degludec (TRESIBA FLEXTOUCH) 200 UNIT/ML FlexTouch Pen Inject 50 Units into the skin daily.  01/29/20   [provider]  Insulin Pen Needle (B-D ULTRAFINE III SHORT PEN) 31G X 8 MM MISC USE 5 TIMES DAILY AS  DIRECTED 12/01/19   Carrie Mew, MD  KLOR-CON M10 10 MEQ tablet Take 10 mEq by mouth daily. 04/29/20   [provider]  loratadine (CLARITIN) 10 MG tablet Take 10 mg by mouth daily as needed.     [provider]  losartan (COZAAR) 100 MG tablet Take 1 tablet (100 mg total) by mouth at bedtime. 12/01/19   Carrie Mew, MD  naproxen sodium (ALEVE) 220 MG tablet Take 220 mg by mouth daily as needed.    [provider]  spironolactone (ALDACTONE) 25 MG tablet Take 0.5 tablets (12.5 mg total) by mouth daily. 01/15/20   Lorella Nimrod, MD   SYMBICORT 160-4.5 MCG/ACT inhaler Inhale 2 puffs into the lungs 2 (two) times daily. 05/27/20   [provider]     Allergies Vancomycin and Lisinopril   Family History  Problem Relation Age of Onset  . Hypertension Mother   . Heart attack Paternal Grandmother   . Heart attack Father   . Bladder Cancer Neg Hx   . Kidney cancer Neg Hx   . Prostate cancer Neg Hx     Social History Social History   Tobacco Use  . Smoking status: Never Smoker  . Smokeless tobacco: Never Used  Vaping Use  . Vaping Use: Never used  Substance Use Topics  . Alcohol use: No  . Drug use: No    Review of Systems  Constitutional:   No fever positive chills.  Positive fatigue and body aches. ENT:   No sore throat. No rhinorrhea. Cardiovascular:   No chest pain or syncope. Respiratory:   No dyspnea or cough. Gastrointestinal:   Positive as above for abdominal pain and diarrhea. Musculoskeletal:   Negative for focal pain or swelling All other systems reviewed and are negative except as documented above in ROS and HPI.  ____________________________________________   PHYSICAL EXAM:  VITAL SIGNS: ED Triage Vitals  Enc Vitals Group     BP 09/19/20 1009 (!) 142/77     Pulse Rate 09/19/20 1009 100     Resp 09/19/20 1009 (!) 24     Temp 09/19/20 1009 99.8 F (37.7 C)     Temp Source 09/19/20 1009 Oral     SpO2 09/19/20 1009 96 %     Weight 09/19/20 1009 200 lb 13.4 oz (91.1 kg)     Height 09/19/20 1009 6\' 1"  (1.854 m)     Head Circumference --      Peak Flow --      Pain Score 09/19/20 1015 10     Pain Loc --      Pain Edu? --      Excl. in [place]? --     Vital signs reviewed, nursing assessments reviewed.   Constitutional:   Alert and oriented. Non-toxic appearance. Eyes:   Conjunctivae are normal. EOMI. PERRL. ENT      Head:   Normocephalic and atraumatic.      Nose: Normal      Mouth/Throat: Dry mucous membranes      Neck:   No meningismus. Full  ROM. Hematological/Lymphatic/Immunilogical:   No cervical lymphadenopathy. Cardiovascular:   RRR. Symmetric bilateral radial and DP pulses.  No murmurs. Cap refill less than 2 seconds. Respiratory:   Normal respiratory effort without tachypnea/retractions. Breath sounds are clear and equal bilaterally. No wheezes/rales/rhonchi. Gastrointestinal:   Soft and nontender. Non distended. There is  no CVA tenderness.  No rebound, rigidity, or guarding. Musculoskeletal:   Normal range of motion in all extremities. No joint effusions.  No lower extremity tenderness.  No edema. Neurologic:   Normal speech and language.  Motor grossly intact. No acute focal neurologic deficits are appreciated.  Skin:    Skin is warm, dry and intact. No rash noted.  No petechiae, purpura, or bullae.  ____________________________________________    LABS (pertinent positives/negatives) (all labs ordered are listed, but only abnormal results are displayed) Labs Reviewed  COMPREHENSIVE METABOLIC PANEL - Abnormal; Notable for the following components:      Result Value   CO2 21 (*)    Glucose, Bld 288 (*)    BUN 25 (*)    Calcium 8.6 (*)    Albumin 3.3 (*)    All other components within normal limits  CBC - Abnormal; Notable for the following components:   Platelets 146 (*)    All other components within normal limits  URINALYSIS, COMPLETE (UACMP) WITH MICROSCOPIC - Abnormal; Notable for the following components:   Color, Urine AMBER (*)    APPearance HAZY (*)    Specific Gravity, Urine 1.031 (*)    Glucose, UA >=500 (*)    Hgb urine dipstick SMALL (*)    Ketones, ur 80 (*)    Protein, ur >=300 (*)    Bacteria, UA RARE (*)    All other components within normal limits  BETA-HYDROXYBUTYRIC ACID - Abnormal; Notable for the following components:   Beta-Hydroxybutyric Acid 2.07 (*)    All other components within normal limits  RESP PANEL BY RT-PCR (FLU A&B, COVID) ARPGX2  LIPASE, BLOOD  PROCALCITONIN    ____________________________________________   EKG    ____________________________________________    RADIOLOGY  DG Chest 2 View  Result Date: 09/19/2020 CLINICAL DATA:  Cough EXAM: CHEST - 2 VIEW COMPARISON:  07/14/2020 FINDINGS: Pacemaker/AICD appears unchanged. There is acute perihilar bronchopneumonia in the right upper lobe. Left lung is clear except for mild scarring or atelectasis at the base. No effusion. No significant bone finding. IMPRESSION: Acute perihilar bronchopneumonia in the right upper lobe. Electronically Signed   By: Nelson Chimes M.D.   On: 09/19/2020 10:48    ____________________________________________   PROCEDURES Procedures  ____________________________________________  DIFFERENTIAL DIAGNOSIS   Viral syndrome, COVID-19, pneumonia, dehydration, DKA, electrolyte abnormality  CLINICAL IMPRESSION / ASSESSMENT AND PLAN / ED COURSE  Medications ordered in the ED: Medications  cefTRIAXone (ROCEPHIN) 2 g in sodium chloride 0.9 % 100 mL IVPB (has no administration in time range)  azithromycin (ZITHROMAX) 500 mg in sodium chloride 0.9 % 250 mL IVPB (has no administration in time range)  ondansetron (ZOFRAN) injection 4 mg (4 mg Intravenous Given 09/19/20 1256)  sodium chloride 0.9 % bolus 1,000 mL (1,000 mLs Intravenous New Bag/Given 09/19/20 1255)  morphine 4 MG/ML injection 4 mg (4 mg Intravenous Given 09/19/20 1256)    Pertinent labs & imaging results that were available during my care of the patient were reviewed by me and considered in my medical decision making (see chart for details).  Joseph [name] was evaluated in Emergency Department on 09/19/2020 for the symptoms described in the history of present illness. He was evaluated in the context of the global COVID-19 pandemic, which necessitated consideration that the patient might be at risk for infection with the SARS-CoV-2 virus that causes COVID-19. Institutional protocols and algorithms that  pertain to the evaluation of patients at risk for COVID-19 are in a state of  rapid change based on information released by regulatory bodies including the CDC and federal and state organizations. These policies and algorithms were followed during the patient's care in the ED.     Clinical Course as of 09/19/20 1412  Fri Sep 19, 2020  1235 Pt p/w body aches, diarrhea, sob, inability to eat x3 days. Found to be dehydrated and cxr reveals bronchopneumonia.  I viewed and interpreted the chest xray image personally, and reviewed radiology report to confirm.  Possible underlying viral syndrome including Covid19. Lab panel not c/w DKA, will additional labs. Start abx for CAP, IVF, morphine 4mg  IV and zofran. Will need to admit due to severity of sx, pneumonia, and comorbidities. [PS]  1411 Case d/w hospitalist who will eval.  [PS]    Clinical Course User Index [PS] Carrie Mew, MD     ____________________________________________   FINAL CLINICAL IMPRESSION(S) / ED DIAGNOSES    Final diagnoses:  Community acquired pneumonia of right upper lobe of lung  Type 2 diabetes mellitus with hyperglycemia, with long-term current use of insulin ([place])  Chronic congestive heart failure, unspecified heart failure type [facility])     ED Discharge Orders    None      Portions of this note were generated with dragon dictation software. Dictation errors may occur despite best attempts at proofreading.   Carrie Mew, MD 09/19/20 1312

## 2020-09-20 DIAGNOSIS — J1282 Pneumonia due to coronavirus disease 2019: Secondary | ICD-10-CM | POA: Diagnosis not present

## 2020-09-20 DIAGNOSIS — U071 COVID-19: Secondary | ICD-10-CM | POA: Diagnosis not present

## 2020-09-20 LAB — CBC WITH DIFFERENTIAL/PLATELET
Abs Immature Granulocytes: 0.01 10*3/uL (ref 0.00–0.07)
Basophils Absolute: 0 10*3/uL (ref 0.0–0.1)
Basophils Relative: 0 %
Eosinophils Absolute: 0 10*3/uL (ref 0.0–0.5)
Eosinophils Relative: 0 %
HCT: 40.6 % (ref 39.0–52.0)
Hemoglobin: 13.8 g/dL (ref 13.0–17.0)
Immature Granulocytes: 0 %
Lymphocytes Relative: 30 %
Lymphs Abs: 1.3 10*3/uL (ref 0.7–4.0)
MCH: 30.2 pg (ref 26.0–34.0)
MCHC: 34 g/dL (ref 30.0–36.0)
MCV: 88.8 fL (ref 80.0–100.0)
Monocytes Absolute: 0.7 10*3/uL (ref 0.1–1.0)
Monocytes Relative: 17 %
Neutro Abs: 2.3 10*3/uL (ref 1.7–7.7)
Neutrophils Relative %: 53 %
Platelets: 115 10*3/uL — ABNORMAL LOW (ref 150–400)
RBC: 4.57 MIL/uL (ref 4.22–5.81)
RDW: 12.6 % (ref 11.5–15.5)
WBC: 4.3 10*3/uL (ref 4.0–10.5)
nRBC: 0 % (ref 0.0–0.2)

## 2020-09-20 LAB — PHOSPHORUS: Phosphorus: 3 mg/dL (ref 2.5–4.6)

## 2020-09-20 LAB — PROCALCITONIN: Procalcitonin: 0.1 ng/mL

## 2020-09-20 LAB — MAGNESIUM: Magnesium: 2.1 mg/dL (ref 1.7–2.4)

## 2020-09-20 LAB — COMPREHENSIVE METABOLIC PANEL
ALT: 21 U/L (ref 0–44)
AST: 26 U/L (ref 15–41)
Albumin: 2.8 g/dL — ABNORMAL LOW (ref 3.5–5.0)
Alkaline Phosphatase: 86 U/L (ref 38–126)
Anion gap: 11 (ref 5–15)
BUN: 25 mg/dL — ABNORMAL HIGH (ref 6–20)
CO2: 19 mmol/L — ABNORMAL LOW (ref 22–32)
Calcium: 7.9 mg/dL — ABNORMAL LOW (ref 8.9–10.3)
Chloride: 106 mmol/L (ref 98–111)
Creatinine, Ser: 1.22 mg/dL (ref 0.61–1.24)
GFR, Estimated: >60 mL/min (ref >60)
Glucose, Bld: 311 mg/dL — ABNORMAL HIGH (ref 70–99)
Potassium: 4.1 mmol/L (ref 3.5–5.1)
Sodium: 136 mmol/L (ref 135–145)
Total Bilirubin: 1.1 mg/dL (ref 0.3–1.2)
Total Protein: 6.3 g/dL — ABNORMAL LOW (ref 6.5–8.1)

## 2020-09-20 LAB — GLUCOSE, CAPILLARY
Glucose-Capillary: 287 mg/dL — ABNORMAL HIGH (ref 70–99)
Glucose-Capillary: 275 mg/dL — ABNORMAL HIGH (ref 70–99)
Glucose-Capillary: 153 mg/dL — ABNORMAL HIGH (ref 70–99)
Glucose-Capillary: 262 mg/dL — ABNORMAL HIGH (ref 70–99)

## 2020-09-20 LAB — C-REACTIVE PROTEIN: CRP: 10.5 mg/dL — ABNORMAL HIGH (ref <1.0)

## 2020-09-20 LAB — FERRITIN: Ferritin: 294 ng/mL (ref 24–336)

## 2020-09-20 LAB — FIBRIN DERIVATIVES D-DIMER (ARMC ONLY): Fibrin derivatives D-dimer (ARMC): 772.32 ng/mL (FEU) — ABNORMAL HIGH (ref 0.00–499.00)

## 2020-09-20 MED ORDER — KETOROLAC TROMETHAMINE 15 MG/ML IJ SOLN
15.0000 mg | Freq: Three times a day (TID) | INTRAMUSCULAR | Status: AC | PRN
  Filled 2020-09-20: qty 1

## 2020-09-20 MED ORDER — INSULIN ASPART 100 UNIT/ML ~~LOC~~ SOLN
0.0000 [IU] | Freq: Every day | SUBCUTANEOUS | Status: DC
  Administered 2020-09-20: 3 [IU] via SUBCUTANEOUS
  Filled 2020-09-20: qty 1

## 2020-09-20 MED ORDER — INSULIN DETEMIR 100 UNIT/ML ~~LOC~~ SOLN
18.0000 [IU] | Freq: Every day | SUBCUTANEOUS | Status: DC
  Administered 2020-09-21 – 2020-09-22 (×3): 18 [IU] via SUBCUTANEOUS
  Filled 2020-09-20 (×6): qty 0.18

## 2020-09-20 MED ORDER — FAMOTIDINE 20 MG PO TABS
20.0000 mg | ORAL_TABLET | Freq: Once | ORAL | Status: AC
  Administered 2020-09-20: 20 mg via ORAL
  Filled 2020-09-20: qty 1

## 2020-09-20 MED ORDER — INSULIN ASPART 100 UNIT/ML ~~LOC~~ SOLN
0.0000 [IU] | Freq: Three times a day (TID) | SUBCUTANEOUS | Status: DC
  Administered 2020-09-20: 3 [IU] via SUBCUTANEOUS
  Administered 2020-09-21: 8 [IU] via SUBCUTANEOUS
  Administered 2020-09-21: 17:00:00 3 [IU] via SUBCUTANEOUS
  Administered 2020-09-21: 8 [IU] via SUBCUTANEOUS
  Administered 2020-09-22: 18:00:00 5 [IU] via SUBCUTANEOUS
  Administered 2020-09-22: 10:00:00 11 [IU] via SUBCUTANEOUS
  Administered 2020-09-22: 13:00:00 8 [IU] via SUBCUTANEOUS
  Administered 2020-09-23: 12:00:00 11 [IU] via SUBCUTANEOUS
  Administered 2020-09-23: 8 [IU] via SUBCUTANEOUS
  Administered 2020-09-23 – 2020-09-24 (×2): 11 [IU] via SUBCUTANEOUS
  Filled 2020-09-20 (×11): qty 1

## 2020-09-20 MED ORDER — LINAGLIPTIN 5 MG PO TABS
5.0000 mg | ORAL_TABLET | Freq: Every day | ORAL | Status: DC
  Administered 2020-09-20 – 2020-09-23 (×4): 5 mg via ORAL
  Filled 2020-09-20 (×5): qty 1

## 2020-09-20 MED ORDER — AZITHROMYCIN 500 MG PO TABS
500.0000 mg | ORAL_TABLET | Freq: Once | ORAL | Status: AC
  Administered 2020-09-21: 500 mg via ORAL
  Filled 2020-09-20: qty 1

## 2020-09-20 NOTE — Progress Notes (Signed)
PROGRESS NOTE    HONG MORING  BWI:[id number] DOB: Jul 09, 1971 DOA: 09/19/2020 [facility]CP: [facility]    Assessment & Plan:   Principal Problem:   Pneumonia due to COVID-19 virus Active Problems:   Chronic systolic CHF (congestive heart failure) ([place])   Hypertension   Implantable cardioverter-defibrillator (ICD) in situ   Ischemic cardiomyopathy   Diabetes mellitus without complication (HCC)   ANEUDY CHAMPLAIN is a 49 y.o. male with medical history significant for insulin-dependent diabetes mellitus, seizure disorder, hypertension, chronic systolic heart failure with a last known LVEF of 25 to 30%, ischemic cardiomyopathy status post AICD insertion who presents to the ER for evaluation of a 5 to 6-day history of myalgias, fatigue, nausea, decreased oral intake, abdominal pain, nonproductive cough, fever, chills and diarrhea.  Patient states that his symptoms have continued to worsen and that over the last couple of days he has been unable to tolerate any oral intake. Patient received 2 doses of the Moderna vaccine but has not received his booster shot.   COVID-19 infection --provider discussed with infectious disease physician who recommended to administer monoclonal antibody --Procalcitonin levels are within normal limits Patient is not hypoxic at this time and at rest has room air pulse oximetry of 95% and with exertion room air pulse oximetry is remained in the upper 90s. Plan: --monitor O2 requriement --monitor CRP  Gastroenteritis 2/2 COVID-19  --supportive treatment  Bronchopneumonia --started on ceftriaxone and azithro on presentation. --procal neg Plan: --cont ceftriaxone and azithro for now  Chronic systolic CHF Last known LVEF of 25 to 30%.  Appeared stable. --on home furosemide, spironolactone, carvedilol and losartan Plan: --cont home regimen  Diabetes mellitus --increase Levemir to 18u nightly --cont SSI --cont Tradjenta  Ischemic  cardiomyopathy Status post AICD insertion --cont home coreg and losartan   DVT prophylaxis: Lovenox SQ Code Status: Full code  Family Communication:  Status is: inpatient Dispo:   The patient is from: home Anticipated d/c is to: home Anticipated d/c date is: 1-2 days Patient currently is not medically stable to d/c due to: covid with GI symptoms, being treated for CAP   Subjective and Interval History:  Pt continued to have fever.  Complained of body aches.  N/V improved, and no more diarrhea since presentation.  Eating better now.   Objective: Vitals:   09/21/20 0609 09/21/20 0744 09/21/20 1140 09/21/20 1548  BP: 140/81 132/75 (!) 141/82 129/79  Pulse: 80 96 93 93  Resp: 17 18 18 18   Temp: 98.3 F (36.8 C) 98.6 F (37 C) 98.3 F (36.8 C) 98.3 F (36.8 C)  TempSrc:      SpO2: 98% 96% 95% 96%  Weight:      Height:       No intake or output data in the 24 hours ending 09/21/20 1820 Filed Weights   09/19/20 1009 09/19/20 2218 09/20/20 0344  Weight: 91.1 kg 91.8 kg 90.5 kg    Examination:   Constitutional: NAD, AAOx3 HEENT: conjunctivae and lids normal, EOMI CV: No cyanosis.   RESP: normal respiratory effort, on RA Extremities: No effusions, edema in BLE SKIN: warm, dry and intact Neuro: II - XII grossly intact.   Psych: Normal mood and affect.  Appropriate judgement and reason   Data Reviewed: I have personally reviewed following labs and imaging studies  CBC: Recent Labs  Lab 09/19/20 1017 09/20/20 0526 09/21/20 0505  WBC 5.2 4.3 3.6*  NEUTROABS  --  2.3  --   HGB 15.7 13.8  13.7  HCT 44.2 40.6 38.4*  MCV 87.5 88.8 86.7  PLT 146* 115* 417*   Basic Metabolic Panel: Recent Labs  Lab 09/19/20 1017 09/20/20 0526 09/21/20 0505  NA 135 136 135  K 4.3 4.1 3.7  CL 101 106 103  CO2 21* 19* 24  GLUCOSE 288* 311* 344*  BUN 25* 25* 17  CREATININE 1.14 1.22 1.05  CALCIUM 8.6* 7.9* 7.9*  MG  --  2.1 2.0  PHOS  --  3.0  --    GFR: Estimated  Creatinine Clearance: 96.2 mL/min (by C-G formula based on SCr of 1.05 mg/dL). Liver Function Tests: Recent Labs  Lab 09/19/20 1017 09/20/20 0526  AST 32 26  ALT 27 21  ALKPHOS 102 86  BILITOT 1.0 1.1  PROT 7.3 6.3*  ALBUMIN 3.3* 2.8*   Recent Labs  Lab 09/19/20 1017  LIPASE 18   No results for input(s): AMMONIA in the last 168 hours. Coagulation Profile: No results for input(s): INR, PROTIME in the last 168 hours. Cardiac Enzymes: No results for input(s): CKTOTAL, CKMB, CKMBINDEX, TROPONINI in the last 168 hours. BNP (last 3 results) No results for input(s): PROBNP in the last 8760 hours. HbA1C: Recent Labs    09/19/20 1959  HGBA1C 9.5*   CBG: Recent Labs  Lab 09/20/20 1657 09/20/20 1953 09/21/20 0743 09/21/20 1140 09/21/20 1547  GLUCAP 153* 262* 275* 281* 185*   Lipid Profile: No results for input(s): CHOL, HDL, LDLCALC, TRIG, CHOLHDL, LDLDIRECT in the last 72 hours. Thyroid Function Tests: No results for input(s): TSH, T4TOTAL, FREET4, T3FREE, THYROIDAB in the last 72 hours. Anemia Panel: Recent Labs    09/20/20 0526  FERRITIN 294   Sepsis Labs: Recent Labs  Lab 09/19/20 1017 09/20/20 0526 09/21/20 0505  PROCALCITON 0.12 0.10 <0.10    Recent Results (from the past 240 hour(s))  Resp Panel by RT-PCR (Flu A&B, Covid) Nasopharyngeal Swab     Status: Abnormal   Collection Time: 09/19/20 12:54 PM   Specimen: Nasopharyngeal Swab; Nasopharyngeal(NP) swabs in vial transport medium  Result Value Ref Range Status   SARS Coronavirus 2 by RT PCR POSITIVE (A) NEGATIVE Final    Comment: RESULT CALLED TO, READ BACK BY AND VERIFIED WITH: JANE MARTIN 09/19/20 1442 KLW (NOTE) SARS-CoV-2 target nucleic acids are DETECTED.  The SARS-CoV-2 RNA is generally detectable in upper respiratory specimens during the acute phase of infection. Positive results are indicative of the presence of the identified virus, but do not rule out bacterial infection or co-infection  with other pathogens not detected by the test. Clinical correlation with patient history and other diagnostic information is necessary to determine patient infection status. The expected result is Negative.  Fact Sheet for Patients: EntrepreneurPulse.com.au  Fact Sheet for Healthcare Providers: IncredibleEmployment.be  This test is not yet approved or cleared by the [place] FDA and  has been authorized for detection and/or diagnosis of SARS-CoV-2 by FDA under an Emergency Use Authorization (EUA).  This EUA will remain in effect (meaning this test can be use d) for the duration of  the COVID-19 declaration under Section 564(b)(1) of the Act, 21 U.S.C. section 360bbb-3(b)(1), unless the authorization is terminated or revoked sooner.     Influenza A by PCR NEGATIVE NEGATIVE Final   Influenza B by PCR NEGATIVE NEGATIVE Final    Comment: (NOTE) The Xpert Xpress SARS-CoV-2/FLU/RSV plus assay is intended as an aid in the diagnosis of influenza from Nasopharyngeal swab specimens and should not be used as a sole  basis for treatment. Nasal washings and aspirates are unacceptable for Xpert Xpress SARS-CoV-2/FLU/RSV testing.  Fact Sheet for Patients: EntrepreneurPulse.com.au  Fact Sheet for Healthcare Providers: IncredibleEmployment.be  This test is not yet approved or cleared by the [place] FDA and has been authorized for detection and/or diagnosis of SARS-CoV-2 by FDA under an Emergency Use Authorization (EUA). This EUA will remain in effect (meaning this test can be used) for the duration of the COVID-19 declaration under Section 564(b)(1) of the Act, 21 U.S.C. section 360bbb-3(b)(1), unless the authorization is terminated or revoked.  Performed at [facility], [address], [place], [place] [address]       Radiology Studies: No results found.   Scheduled Meds:  albuterol  2  puff Inhalation Q6H   vitamin C  500 mg Oral Daily   aspirin EC  81 mg Oral [id number]   atorvastatin  40 mg Oral QPM   carvedilol  12.5 mg Oral Daily   cholecalciferol  1,000 Units Oral Daily   digoxin  125 mcg Oral Daily   enoxaparin (LOVENOX) injection  40 mg Subcutaneous Q24H   furosemide  20 mg Oral BID   insulin aspart  0-15 Units Subcutaneous TID WC   insulin aspart  0-5 Units Subcutaneous QHS   insulin detemir  18 Units Subcutaneous QHS   linagliptin  5 mg Oral Daily   loratadine  10 mg Oral Daily   losartan  100 mg Oral QHS   potassium chloride  10 mEq Oral Daily   sodium chloride flush  3 mL Intravenous Q12H   spironolactone  12.5 mg Oral Daily   zinc sulfate  220 mg Oral Daily   Continuous Infusions:  sodium chloride     sodium chloride     casirivimab-imdevimab (REGEN-COV) IVPB     cefTRIAXone (ROCEPHIN)  IV 2 g (09/20/20 1159)     LOS: 2 days     Enzo Bi, MD [facility] If 7PM-7AM, please contact night-coverage 09/21/2020, 6:20 PM

## 2020-09-20 NOTE — Progress Notes (Signed)
No acute events over night. No complaints of SOB or c/p. Did complain of heartburn x2. Given IV pepcid x1 and oral pepcid x1. Will continue to monitor.

## 2020-09-21 DIAGNOSIS — U071 COVID-19: Secondary | ICD-10-CM | POA: Diagnosis not present

## 2020-09-21 DIAGNOSIS — J1282 Pneumonia due to coronavirus disease 2019: Secondary | ICD-10-CM | POA: Diagnosis not present

## 2020-09-21 LAB — GLUCOSE, CAPILLARY
Glucose-Capillary: 275 mg/dL — ABNORMAL HIGH (ref 70–99)
Glucose-Capillary: 281 mg/dL — ABNORMAL HIGH (ref 70–99)
Glucose-Capillary: 185 mg/dL — ABNORMAL HIGH (ref 70–99)
Glucose-Capillary: 177 mg/dL — ABNORMAL HIGH (ref 70–99)

## 2020-09-21 LAB — MAGNESIUM: Magnesium: 2 mg/dL (ref 1.7–2.4)

## 2020-09-21 LAB — CBC
HCT: 38.4 % — ABNORMAL LOW (ref 39.0–52.0)
Hemoglobin: 13.7 g/dL (ref 13.0–17.0)
MCH: 30.9 pg (ref 26.0–34.0)
MCHC: 35.7 g/dL (ref 30.0–36.0)
MCV: 86.7 fL (ref 80.0–100.0)
Platelets: 114 10*3/uL — ABNORMAL LOW (ref 150–400)
RBC: 4.43 MIL/uL (ref 4.22–5.81)
RDW: 12.2 % (ref 11.5–15.5)
WBC: 3.6 10*3/uL — ABNORMAL LOW (ref 4.0–10.5)
nRBC: 0 % (ref 0.0–0.2)

## 2020-09-21 LAB — BASIC METABOLIC PANEL
Anion gap: 8 (ref 5–15)
BUN: 17 mg/dL (ref 6–20)
CO2: 24 mmol/L (ref 22–32)
Calcium: 7.9 mg/dL — ABNORMAL LOW (ref 8.9–10.3)
Chloride: 103 mmol/L (ref 98–111)
Creatinine, Ser: 1.05 mg/dL (ref 0.61–1.24)
GFR, Estimated: >60 mL/min (ref >60)
Glucose, Bld: 344 mg/dL — ABNORMAL HIGH (ref 70–99)
Potassium: 3.7 mmol/L (ref 3.5–5.1)
Sodium: 135 mmol/L (ref 135–145)

## 2020-09-21 LAB — C-REACTIVE PROTEIN: CRP: 9.7 mg/dL — ABNORMAL HIGH (ref <1.0)

## 2020-09-21 LAB — PROCALCITONIN: Procalcitonin: <0.1 ng/mL

## 2020-09-21 MED ORDER — SODIUM CHLORIDE 0.9 % IV BOLUS
500.0000 mL | Freq: Once | INTRAVENOUS | Status: AC
  Administered 2020-09-21: 22:00:00 500 mL via INTRAVENOUS

## 2020-09-21 MED ORDER — LOPERAMIDE HCL 2 MG PO CAPS: 2.0000 mg | ORAL_CAPSULE | Freq: Two times a day (BID) | ORAL | Status: DC | PRN

## 2020-09-21 NOTE — Progress Notes (Signed)
PROGRESS NOTE    KAMRAN COKER  GNF:[id number] DOB: Aug 17, 1971 DOA: 09/19/2020 PCP: [facility], [facility]    Assessment & Plan:   Principal Problem:   Pneumonia due to COVID-19 virus Active Problems:   Chronic systolic CHF (congestive heart failure) ([place])   Hypertension   Implantable cardioverter-defibrillator (ICD) in situ   Ischemic cardiomyopathy   Diabetes mellitus without complication (HCC)   AXAVIER PRESSLEY is a 49 y.o. male with medical history significant for insulin-dependent diabetes mellitus, seizure disorder, hypertension, chronic systolic heart failure with a last known LVEF of 25 to 30%, ischemic cardiomyopathy status post AICD insertion who presents to the ER for evaluation of a 5 to 6-day history of myalgias, fatigue, nausea, decreased oral intake, abdominal pain, nonproductive cough, fever, chills and diarrhea.  Patient states that his symptoms have continued to worsen and that over the last couple of days he has been unable to tolerate any oral intake. Patient received 2 doses of the Moderna vaccine but has not received his booster shot.   COVID-19 infection --admitting provider discussed with infectious disease physician who recommended to administer monoclonal antibody, however, it was not given for some reason. --Procalcitonin levels are within normal limits Patient is not hypoxic at this time and at rest has room air pulse oximetry of 95% and with exertion room air pulse oximetry is remained in the upper 90s. Plan: --monitor O2 requriement --monitor CRP  N/V/D --likely due to COVID gastroenteritis, however, due to persistent fever, will check C diff. --supportive care  Bronchopneumonia --started on ceftriaxone and azithro on presentation. --procal neg Plan: --cont ceftriaxone and azithro for now since pt is having persistent fever  Chronic systolic CHF Last known LVEF of 25 to 30%.  Appeared stable. --on home furosemide, spironolactone,  carvedilol and losartan Plan: --cont home regimen  Diabetes mellitus --cont Levemir 18u nightly --cont SSI --cont Tradjenta  Ischemic cardiomyopathy Status post AICD insertion --cont home coreg and losartan   DVT prophylaxis: Lovenox SQ Code Status: Full code  Family Communication:  Status is: inpatient Dispo:   The patient is from: home Anticipated d/c is to: home Anticipated d/c date is: 1-2 days Patient currently is not medically stable to d/c due to: covid with GI symptoms, being treated for CAP, can't tolerate oral intake due to N/V.   Subjective and Interval History:  Pt reported vomiting after eating and having diarrhea.  Also had cough with sputum production.  Fortunately still on room air.   Objective: Vitals:   09/22/20 0804 09/22/20 1205 09/22/20 1420 09/22/20 1621  BP: 126/72 (!) 148/85 139/80 125/72  Pulse: 90 96 94 91  Resp:  20 20 18   Temp: 98.9 F (37.2 C) (!) 101.6 F (38.7 C) 100.3 F (37.9 C) (!) 102.9 F (39.4 C)  TempSrc: Oral Oral Oral Oral  SpO2: 96% 98% 94% 94%  Weight:      Height:       No intake or output data in the 24 hours ending 09/22/20 1737 Filed Weights   09/19/20 1009 09/19/20 2218 09/20/20 0344  Weight: 91.1 kg 91.8 kg 90.5 kg    Examination:   Constitutional: NAD, AAOx3 HEENT: conjunctivae and lids normal, EOMI CV: No cyanosis.   RESP: reduced lung sounds, on RA Extremities: No effusions, edema in BLE SKIN: warm, dry and intact Neuro: II - XII grossly intact.   Psych: Normal mood and affect.  Appropriate judgement and reason   Data Reviewed: I have personally reviewed following labs and  imaging studies  CBC: Recent Labs  Lab 09/19/20 1017 09/20/20 0526 09/21/20 0505 09/22/20 0448  WBC 5.2 4.3 3.6* 5.3  NEUTROABS  --  2.3  --   --   HGB 15.7 13.8 13.7 13.7  HCT 44.2 40.6 38.4* 38.9*  MCV 87.5 88.8 86.7 88.4  PLT 146* 115* 114* 893*   Basic Metabolic Panel: Recent Labs  Lab 09/19/20 1017  09/20/20 0526 09/21/20 0505 09/22/20 0448  NA 135 136 135 135  K 4.3 4.1 3.7 3.7  CL 101 106 103 103  CO2 21* 19* 24 24  GLUCOSE 288* 311* 344* 234*  BUN 25* 25* 17 17  CREATININE 1.14 1.22 1.05 1.23  CALCIUM 8.6* 7.9* 7.9* 7.9*  MG  --  2.1 2.0 1.9  PHOS  --  3.0  --   --    GFR: Estimated Creatinine Clearance: 82.1 mL/min (by C-G formula based on SCr of 1.23 mg/dL). Liver Function Tests: Recent Labs  Lab 09/19/20 1017 09/20/20 0526  AST 32 26  ALT 27 21  ALKPHOS 102 86  BILITOT 1.0 1.1  PROT 7.3 6.3*  ALBUMIN 3.3* 2.8*   Recent Labs  Lab 09/19/20 1017  LIPASE 18   No results for input(s): AMMONIA in the last 168 hours. Coagulation Profile: No results for input(s): INR, PROTIME in the last 168 hours. Cardiac Enzymes: No results for input(s): CKTOTAL, CKMB, CKMBINDEX, TROPONINI in the last 168 hours. BNP (last 3 results) No results for input(s): PROBNP in the last 8760 hours. HbA1C: Recent Labs    09/19/20 1959  HGBA1C 9.5*   CBG: Recent Labs  Lab 09/21/20 1547 09/21/20 2105 09/22/20 0803 09/22/20 1204 09/22/20 1619  GLUCAP 185* 177* 301* 299* 205*   Lipid Profile: No results for input(s): CHOL, HDL, LDLCALC, TRIG, CHOLHDL, LDLDIRECT in the last 72 hours. Thyroid Function Tests: No results for input(s): TSH, T4TOTAL, FREET4, T3FREE, THYROIDAB in the last 72 hours. Anemia Panel: Recent Labs    09/20/20 0526  FERRITIN 294   Sepsis Labs: Recent Labs  Lab 09/19/20 1017 09/20/20 0526 09/21/20 0505  PROCALCITON 0.12 0.10 <0.10    Recent Results (from the past 240 hour(s))  Resp Panel by RT-PCR (Flu A&B, Covid) Nasopharyngeal Swab     Status: Abnormal   Collection Time: 09/19/20 12:54 PM   Specimen: Nasopharyngeal Swab; Nasopharyngeal(NP) swabs in vial transport medium  Result Value Ref Range Status   SARS Coronavirus 2 by RT PCR POSITIVE (A) NEGATIVE Final    Comment: RESULT CALLED TO, READ BACK BY AND VERIFIED WITH: JANE MARTIN 09/19/20  1442 KLW (NOTE) SARS-CoV-2 target nucleic acids are DETECTED.  The SARS-CoV-2 RNA is generally detectable in upper respiratory specimens during the acute phase of infection. Positive results are indicative of the presence of the identified virus, but do not rule out bacterial infection or co-infection with other pathogens not detected by the test. Clinical correlation with patient history and other diagnostic information is necessary to determine patient infection status. The expected result is Negative.  Fact Sheet for Patients: EntrepreneurPulse.com.au  Fact Sheet for Healthcare Providers: IncredibleEmployment.be  This test is not yet approved or cleared by the [place] FDA and  has been authorized for detection and/or diagnosis of SARS-CoV-2 by FDA under an Emergency Use Authorization (EUA).  This EUA will remain in effect (meaning this test can be use d) for the duration of  the COVID-19 declaration under Section 564(b)(1) of the Act, 21 U.S.C. section 360bbb-3(b)(1), unless the authorization is terminated  or revoked sooner.     Influenza A by PCR NEGATIVE NEGATIVE Final   Influenza B by PCR NEGATIVE NEGATIVE Final    Comment: (NOTE) The Xpert Xpress SARS-CoV-2/FLU/RSV plus assay is intended as an aid in the diagnosis of influenza from Nasopharyngeal swab specimens and should not be used as a sole basis for treatment. Nasal washings and aspirates are unacceptable for Xpert Xpress SARS-CoV-2/FLU/RSV testing.  Fact Sheet for Patients: EntrepreneurPulse.com.au  Fact Sheet for Healthcare Providers: IncredibleEmployment.be  This test is not yet approved or cleared by the [place] FDA and has been authorized for detection and/or diagnosis of SARS-CoV-2 by FDA under an Emergency Use Authorization (EUA). This EUA will remain in effect (meaning this test can be used) for the duration of  the COVID-19 declaration under Section 564(b)(1) of the Act, 21 U.S.C. section 360bbb-3(b)(1), unless the authorization is terminated or revoked.  Performed at [facility], [place], [place] [address]   C Difficile Quick Screen w PCR reflex     Status: None   Collection Time: 09/22/20  9:48 AM   Specimen: STOOL  Result Value Ref Range Status   C Diff antigen NEGATIVE NEGATIVE Final   C Diff toxin NEGATIVE NEGATIVE Final   C Diff interpretation No C. difficile detected.  Final    Comment: Performed at [facility], [address] [place], [place] [address]      Radiology Studies: No results found.   Scheduled Meds: . albuterol  2 puff Inhalation Q6H  . vitamin C  500 mg Oral Daily  . aspirin EC  81 mg Oral BH-q7a  . atorvastatin  40 mg Oral QPM  . carvedilol  12.5 mg Oral Daily  . cholecalciferol  1,000 Units Oral Daily  . dexamethasone  6 mg Oral Daily  . digoxin  125 mcg Oral Daily  . enoxaparin (LOVENOX) injection  40 mg Subcutaneous Q24H  . furosemide  20 mg Oral BID  . insulin aspart  0-15 Units Subcutaneous TID WC  . insulin aspart  0-5 Units Subcutaneous QHS  . insulin detemir  18 Units Subcutaneous QHS  . linagliptin  5 mg Oral Daily  . loratadine  10 mg Oral Daily  . losartan  100 mg Oral QHS  . potassium chloride  10 mEq Oral Daily  . sodium chloride flush  3 mL Intravenous Q12H  . spironolactone  12.5 mg Oral Daily  . zinc sulfate  220 mg Oral Daily   Continuous Infusions: . sodium chloride    . sodium chloride    . casirivimab-imdevimab (REGEN-COV) IVPB    . cefTRIAXone (ROCEPHIN)  IV Stopped (09/22/20 5625)     LOS: 3 days     Enzo Bi, MD [facility] If 7PM-7AM, please contact night-coverage 09/22/2020, 5:37 PM

## 2020-09-21 NOTE — Progress Notes (Signed)
Patient temp 102.3, tylenol given with no improvement, hospitalist notified.

## 2020-09-21 NOTE — Plan of Care (Signed)

## 2020-09-22 DIAGNOSIS — J1282 Pneumonia due to coronavirus disease 2019: Secondary | ICD-10-CM | POA: Diagnosis not present

## 2020-09-22 DIAGNOSIS — U071 COVID-19: Secondary | ICD-10-CM | POA: Diagnosis not present

## 2020-09-22 LAB — RESPIRATORY PANEL BY PCR

## 2020-09-22 LAB — C-REACTIVE PROTEIN: CRP: 12.9 mg/dL — ABNORMAL HIGH (ref <1.0)

## 2020-09-22 LAB — BASIC METABOLIC PANEL
Anion gap: 8 (ref 5–15)
BUN: 17 mg/dL (ref 6–20)
CO2: 24 mmol/L (ref 22–32)
Calcium: 7.9 mg/dL — ABNORMAL LOW (ref 8.9–10.3)
Chloride: 103 mmol/L (ref 98–111)
Creatinine, Ser: 1.23 mg/dL (ref 0.61–1.24)
GFR, Estimated: >60 mL/min (ref >60)
Glucose, Bld: 234 mg/dL — ABNORMAL HIGH (ref 70–99)
Potassium: 3.7 mmol/L (ref 3.5–5.1)
Sodium: 135 mmol/L (ref 135–145)

## 2020-09-22 LAB — C DIFFICILE QUICK SCREEN W PCR REFLEX
C Diff antigen: NEGATIVE
C Diff interpretation: NOT DETECTED
C Diff toxin: NEGATIVE

## 2020-09-22 LAB — CBC
HCT: 38.9 % — ABNORMAL LOW (ref 39.0–52.0)
Hemoglobin: 13.7 g/dL (ref 13.0–17.0)
MCH: 31.1 pg (ref 26.0–34.0)
MCHC: 35.2 g/dL (ref 30.0–36.0)
MCV: 88.4 fL (ref 80.0–100.0)
Platelets: 108 10*3/uL — ABNORMAL LOW (ref 150–400)
RBC: 4.4 MIL/uL (ref 4.22–5.81)
RDW: 12.3 % (ref 11.5–15.5)
WBC: 5.3 10*3/uL (ref 4.0–10.5)
nRBC: 0 % (ref 0.0–0.2)

## 2020-09-22 LAB — GLUCOSE, CAPILLARY
Glucose-Capillary: 301 mg/dL — ABNORMAL HIGH (ref 70–99)
Glucose-Capillary: 299 mg/dL — ABNORMAL HIGH (ref 70–99)
Glucose-Capillary: 205 mg/dL — ABNORMAL HIGH (ref 70–99)
Glucose-Capillary: 145 mg/dL — ABNORMAL HIGH (ref 70–99)
Glucose-Capillary: 157 mg/dL — ABNORMAL HIGH (ref 70–99)

## 2020-09-22 LAB — MAGNESIUM: Magnesium: 1.9 mg/dL (ref 1.7–2.4)

## 2020-09-22 MED ORDER — ACETAMINOPHEN 500 MG PO TABS
1000.0000 mg | ORAL_TABLET | Freq: Three times a day (TID) | ORAL | Status: DC | PRN
  Administered 2020-09-22: 1000 mg via ORAL
  Filled 2020-09-22: qty 2

## 2020-09-22 MED ORDER — DEXAMETHASONE 4 MG PO TABS
6.0000 mg | ORAL_TABLET | Freq: Every day | ORAL | Status: DC
  Administered 2020-09-22 – 2020-09-23 (×2): 6 mg via ORAL
  Filled 2020-09-22: qty 2

## 2020-09-22 MED ORDER — IBUPROFEN 400 MG PO TABS
400.0000 mg | ORAL_TABLET | Freq: Four times a day (QID) | ORAL | Status: DC | PRN
  Administered 2020-09-22: 400 mg via ORAL
  Filled 2020-09-22: qty 1

## 2020-09-22 MED ORDER — ACETAMINOPHEN 500 MG PO TABS: 1000.0000 mg | ORAL_TABLET | Freq: Four times a day (QID) | ORAL | Status: DC | PRN

## 2020-09-22 NOTE — Progress Notes (Signed)
PROGRESS NOTE    Joseph [name]  IWP:[id number] DOB: July 13, 1971 DOA: 09/19/2020 PCP: [facility], [facility]    Assessment & Plan:   Principal Problem:   Pneumonia due to COVID-19 virus Active Problems:   Chronic systolic CHF (congestive heart failure) ([place])   Hypertension   Implantable cardioverter-defibrillator (ICD) in situ   Ischemic cardiomyopathy   Diabetes mellitus without complication (HCC)   Joseph [name] is a 49 y.o. male with medical history significant for insulin-dependent diabetes mellitus, seizure disorder, hypertension, chronic systolic heart failure with a last known LVEF of 25 to 30%, ischemic cardiomyopathy status post AICD insertion who presents to the ER for evaluation of a 5 to 6-day history of myalgias, fatigue, nausea, decreased oral intake, abdominal pain, nonproductive cough, fever, chills and diarrhea.  Patient states that his symptoms have continued to worsen and that over the last couple of days he has been unable to tolerate any oral intake. Patient received 2 doses of the Moderna vaccine but has not received his booster shot.  Persistent high fevers --presumed due to COVID infection. --procal has been neg, D-dimer not very elevated. Plan: --blood cx today --obtain RVP --tylenol and Advil PRN   COVID-19 infection --admitting provider discussed with infectious disease physician who recommended to administer monoclonal antibody, however, it was not given for some reason. --Procalcitonin levels are within normal limits Patient is not hypoxic at this time and at rest has room air pulse oximetry of 95% and with exertion room air pulse oximetry is remained in the upper 90s. --CRP 10.5 on presentation, trending up to 12.9 today Plan: --monitor O2 requriement --start oral decadron --trend CRP  N/V/D --due to COVID gastroenteritis --C diff neg --supportive care --Imodium PRN  Bronchopneumonia --started on ceftriaxone and azithro on  presentation. --procal neg Plan: --cont ceftriaxone and azithro for now since pt is having persistent fever  Chronic systolic CHF Last known LVEF of 25 to 30%.  Appeared stable. --on home furosemide, spironolactone, carvedilol and losartan Plan: --cont home regimen  Diabetes mellitus --cont Levemir 18u nightly --cont SSI --cont Tradjenta  Ischemic cardiomyopathy Status post AICD insertion --cont home coreg and losartan   DVT prophylaxis: Lovenox SQ Code Status: Full code  Family Communication:  Status is: inpatient Dispo:   The patient is from: home Anticipated d/c is to: home Anticipated d/c date is: 1-2 days Patient currently is not medically stable to d/c due to: covid with GI symptoms, being treated for CAP, can't tolerate oral intake due to N/V.   Subjective and Interval History:  Pt continued to have vomiting after consuming a meal, sometimes after coughing.  Also still had diarrhea.  C diff neg.  Pt continued to have persistent high fevers.  Still on room air.     Objective: Vitals:   09/22/20 1420 09/22/20 1621 09/22/20 1828 09/22/20 2030  BP: 139/80 125/72  130/74  Pulse: 94 91  92  Resp: 20 18  18   Temp: 100.3 F (37.9 C) (!) 102.9 F (39.4 C) (!) 103.6 F (39.8 C) (!) 101.3 F (38.5 C)  TempSrc: Oral Oral Oral   SpO2: 94% 94%  98%  Weight:      Height:       No intake or output data in the 24 hours ending 09/22/20 2128 Filed Weights   09/19/20 1009 09/19/20 2218 09/20/20 0344  Weight: 91.1 kg 91.8 kg 90.5 kg    Examination:   Constitutional: NAD, AAOx3 HEENT: conjunctivae and lids normal, EOMI CV: No  cyanosis.   RESP: normal respiratory effort, on RA, frequent coughs Extremities: No effusions, edema in BLE SKIN: warm, dry and intact Neuro: II - XII grossly intact.   Psych: Normal mood and affect.  Appropriate judgement and reason    Data Reviewed: I have personally reviewed following labs and imaging studies  CBC: Recent Labs   Lab 09/19/20 1017 09/20/20 0526 09/21/20 0505 09/22/20 0448  WBC 5.2 4.3 3.6* 5.3  NEUTROABS  --  2.3  --   --   HGB 15.7 13.8 13.7 13.7  HCT 44.2 40.6 38.4* 38.9*  MCV 87.5 88.8 86.7 88.4  PLT 146* 115* 114* 448*   Basic Metabolic Panel: Recent Labs  Lab 09/19/20 1017 09/20/20 0526 09/21/20 0505 09/22/20 0448  NA 135 136 135 135  K 4.3 4.1 3.7 3.7  CL 101 106 103 103  CO2 21* 19* 24 24  GLUCOSE 288* 311* 344* 234*  BUN 25* 25* 17 17  CREATININE 1.14 1.22 1.05 1.23  CALCIUM 8.6* 7.9* 7.9* 7.9*  MG  --  2.1 2.0 1.9  PHOS  --  3.0  --   --    GFR: Estimated Creatinine Clearance: 82.1 mL/min (by C-G formula based on SCr of 1.23 mg/dL). Liver Function Tests: Recent Labs  Lab 09/19/20 1017 09/20/20 0526  AST 32 26  ALT 27 21  ALKPHOS 102 86  BILITOT 1.0 1.1  PROT 7.3 6.3*  ALBUMIN 3.3* 2.8*   Recent Labs  Lab 09/19/20 1017  LIPASE 18   No results for input(s): AMMONIA in the last 168 hours. Coagulation Profile: No results for input(s): INR, PROTIME in the last 168 hours. Cardiac Enzymes: No results for input(s): CKTOTAL, CKMB, CKMBINDEX, TROPONINI in the last 168 hours. BNP (last 3 results) No results for input(s): PROBNP in the last 8760 hours. HbA1C: No results for input(s): HGBA1C in the last 72 hours. CBG: Recent Labs  Lab 09/21/20 2105 09/22/20 0803 09/22/20 1204 09/22/20 1619 09/22/20 2034  GLUCAP 177* 301* 299* 205* 145*   Lipid Profile: No results for input(s): CHOL, HDL, LDLCALC, TRIG, CHOLHDL, LDLDIRECT in the last 72 hours. Thyroid Function Tests: No results for input(s): TSH, T4TOTAL, FREET4, T3FREE, THYROIDAB in the last 72 hours. Anemia Panel: Recent Labs    09/20/20 0526  FERRITIN 294   Sepsis Labs: Recent Labs  Lab 09/19/20 1017 09/20/20 0526 09/21/20 0505  PROCALCITON 0.12 0.10 <0.10    Recent Results (from the past 240 hour(s))  Resp Panel by RT-PCR (Flu A&B, Covid) Nasopharyngeal Swab     Status: Abnormal    Collection Time: 09/19/20 12:54 PM   Specimen: Nasopharyngeal Swab; Nasopharyngeal(NP) swabs in vial transport medium  Result Value Ref Range Status   SARS Coronavirus 2 by RT PCR POSITIVE (A) NEGATIVE Final    Comment: RESULT CALLED TO, READ BACK BY AND VERIFIED WITH: JANE MARTIN 09/19/20 1442 KLW (NOTE) SARS-CoV-2 target nucleic acids are DETECTED.  The SARS-CoV-2 RNA is generally detectable in upper respiratory specimens during the acute phase of infection. Positive results are indicative of the presence of the identified virus, but do not rule out bacterial infection or co-infection with other pathogens not detected by the test. Clinical correlation with patient history and other diagnostic information is necessary to determine patient infection status. The expected result is Negative.  Fact Sheet for Patients: EntrepreneurPulse.com.au  Fact Sheet for Healthcare Providers: IncredibleEmployment.be  This test is not yet approved or cleared by the [place] and  has been authorized for  detection and/or diagnosis of SARS-CoV-2 by FDA under an Emergency Use Authorization (EUA).  This EUA will remain in effect (meaning this test can be use d) for the duration of  the COVID-19 declaration under Section 564(b)(1) of the Act, 21 U.S.C. section 360bbb-3(b)(1), unless the authorization is terminated or revoked sooner.     Influenza A by PCR NEGATIVE NEGATIVE Final   Influenza B by PCR NEGATIVE NEGATIVE Final    Comment: (NOTE) The Xpert Xpress SARS-CoV-2/FLU/RSV plus assay is intended as an aid in the diagnosis of influenza from Nasopharyngeal swab specimens and should not be used as a sole basis for treatment. Nasal washings and aspirates are unacceptable for Xpert Xpress SARS-CoV-2/FLU/RSV testing.  Fact Sheet for Patients: EntrepreneurPulse.com.au  Fact Sheet for Healthcare  Providers: IncredibleEmployment.be  This test is not yet approved or cleared by the [place] FDA and has been authorized for detection and/or diagnosis of SARS-CoV-2 by FDA under an Emergency Use Authorization (EUA). This EUA will remain in effect (meaning this test can be used) for the duration of the COVID-19 declaration under Section 564(b)(1) of the Act, 21 U.S.C. section 360bbb-3(b)(1), unless the authorization is terminated or revoked.  Performed at [facility], [place], [place] [address]   C Difficile Quick Screen w PCR reflex     Status: None   Collection Time: 09/22/20  9:48 AM   Specimen: STOOL  Result Value Ref Range Status   C Diff antigen NEGATIVE NEGATIVE Final   C Diff toxin NEGATIVE NEGATIVE Final   C Diff interpretation No C. difficile detected.  Final    Comment: Performed at [facility], [address] [place], [place] [address]  Respiratory Panel by PCR     Status: None   Collection Time: 09/22/20 10:28 AM   Specimen: Nasopharyngeal Swab; Respiratory  Result Value Ref Range Status   Adenovirus NOT DETECTED NOT DETECTED Final   Coronavirus 229E NOT DETECTED NOT DETECTED Final    Comment: (NOTE) The Coronavirus on the Respiratory Panel, DOES NOT test for the novel  Coronavirus (2019 nCoV)    Coronavirus HKU1 NOT DETECTED NOT DETECTED Final   Coronavirus NL63 NOT DETECTED NOT DETECTED Final   Coronavirus OC43 NOT DETECTED NOT DETECTED Final   Metapneumovirus NOT DETECTED NOT DETECTED Final   Rhinovirus / Enterovirus NOT DETECTED NOT DETECTED Final   Influenza A NOT DETECTED NOT DETECTED Final   Influenza B NOT DETECTED NOT DETECTED Final   Parainfluenza Virus 1 NOT DETECTED NOT DETECTED Final   Parainfluenza Virus 2 NOT DETECTED NOT DETECTED Final   Parainfluenza Virus 3 NOT DETECTED NOT DETECTED Final   Parainfluenza Virus 4 NOT DETECTED NOT DETECTED Final   Respiratory Syncytial Virus NOT  DETECTED NOT DETECTED Final   Bordetella pertussis NOT DETECTED NOT DETECTED Final   Bordetella Parapertussis NOT DETECTED NOT DETECTED Final   Chlamydophila pneumoniae NOT DETECTED NOT DETECTED Final   Mycoplasma pneumoniae NOT DETECTED NOT DETECTED Final    Comment: Performed at [facility], [place]. [address] [place], [place] [address]      Radiology Studies: No results found.   Scheduled Meds: . albuterol  2 puff Inhalation Q6H  . vitamin C  500 mg Oral Daily  . aspirin EC  81 mg Oral BH-q7a  . atorvastatin  40 mg Oral QPM  . carvedilol  12.5 mg Oral Daily  . cholecalciferol  1,000 Units Oral Daily  . dexamethasone  6 mg Oral Daily  .  digoxin  125 mcg Oral Daily  . enoxaparin (LOVENOX) injection  40 mg Subcutaneous Q24H  . furosemide  20 mg Oral BID  . insulin aspart  0-15 Units Subcutaneous TID WC  . insulin aspart  0-5 Units Subcutaneous QHS  . insulin detemir  18 Units Subcutaneous QHS  . linagliptin  5 mg Oral Daily  . loratadine  10 mg Oral Daily  . losartan  100 mg Oral QHS  . potassium chloride  10 mEq Oral Daily  . sodium chloride flush  3 mL Intravenous Q12H  . spironolactone  12.5 mg Oral Daily  . zinc sulfate  220 mg Oral Daily   Continuous Infusions: . sodium chloride    . sodium chloride    . casirivimab-imdevimab (REGEN-COV) IVPB    . cefTRIAXone (ROCEPHIN)  IV 2 g (09/22/20 2043)     LOS: 3 days     Enzo Bi, MD [facility] If 7PM-7AM, please contact night-coverage 09/22/2020, 9:28 PM

## 2020-09-22 NOTE — Progress Notes (Signed)
Inpatient Diabetes Program Recommendations  AACE/ADA: New Consensus Statement on Inpatient Glycemic Control   Target Ranges:  Prepandial:   less than 140 mg/dL      Peak postprandial:   less than 180 mg/dL (1-2 hours)      Critically ill patients:  140 - 180 mg/dL   Results for HUAN, POLLOK (MRN [id number]) as of 09/22/2020 09:27  Ref. Range 09/21/2020 07:43 09/21/2020 11:40 09/21/2020 15:47 09/21/2020 21:05 09/22/2020 08:03  Glucose-Capillary Latest Ref Range: 70 - 99 mg/dL 275 (H) 281 (H) 185 (H) 177 (H) 301 (H)   Review of Glycemic Control   Current orders for Inpatient glycemic control: Levemir 18 units QHS, Novolog 0-15 units TID with meals, Novolog 0-5 units QHS, Tradjenta 5 mg daily  Inpatient Diabetes Program Recommendations:    Insulin: Please consider increasing Levemir to 22 units QHS and ordering Novolog 4 units TID with meals for meal coverage if patient eats at least 50% of meals.  Thanks, Barnie Alderman, RN, MSN, CDE Diabetes Coordinator Inpatient Diabetes Program [phone] (Team Pager from 8am to 5pm)

## 2020-09-22 NOTE — Progress Notes (Signed)
Patient alert and oriented, 512ml bolus given for temperature with improvement, will continue to monitor.

## 2020-09-22 NOTE — TOC Initial Note (Signed)
Transition of Care [facility]) - Initial/Assessment Note    Patient Details  Name: Joseph [name] MRN: [id number] Date of Birth: 05-Nov-1970  Transition of Care [facility]) CM/SW Contact:    Shelbie Hutching, RN Phone Number: 09/22/2020, 3:18 PM  Clinical Narrative:                 Patient admitted to the hospital with [place].  RNCM attempted to reach patient via phone due to isolation precautions but no answer on room or cell number.  Per record patient is current at the [facility] and gets his prescriptions there as well.  RNCM will attempt to speak with patient again at a later time to complete assessment.   Expected Discharge Plan: Home/Self Care Barriers to Discharge: Continued Medical Work up   Patient Goals and CMS Choice        Expected Discharge Plan and Services Expected Discharge Plan: Home/Self Care       Living arrangements for the past 2 months: Single Family Home                                      Prior Living Arrangements/Services Living arrangements for the past 2 months: Single Family Home   Patient language and need for interpreter reviewed:: Yes              Criminal Activity/Legal Involvement Pertinent to Current Situation/Hospitalization: No - Comment as needed  Activities of Daily Living Home Assistive Devices/Equipment: None ADL Screening (condition at time of admission) Patient's cognitive ability adequate to safely complete daily activities?: Yes Is the patient deaf or have difficulty hearing?: No Does the patient have difficulty seeing, even when wearing glasses/contacts?: No Does the patient have difficulty concentrating, remembering, or making decisions?: No Patient able to express need for assistance with ADLs?: Yes Does the patient have difficulty dressing or bathing?: No Independently performs ADLs?: Yes (appropriate for developmental age) Does the patient have difficulty walking or climbing stairs?: No Weakness of  Legs: None Weakness of Arms/Hands: None  Permission Sought/Granted                  Emotional Assessment       Orientation: : Oriented to Self,Oriented to Place,Oriented to  Time,Oriented to Situation Alcohol / Substance Use: Not Applicable Psych Involvement: No (comment)  Admission diagnosis:  CAP (community acquired pneumonia) [J18.9] Type 2 diabetes mellitus with hyperglycemia, with long-term current use of insulin ([place]) [E11.65, Z79.4] Community acquired pneumonia of right upper lobe of lung [J18.9] Chronic congestive heart failure, unspecified heart failure type ([place]) [I50.9] Pneumonia due to COVID-19 virus [U07.1, J12.82] Patient Active Problem List   Diagnosis Date Noted  . Pneumonia due to COVID-19 virus 09/19/2020  . Diabetes mellitus without complication ([place])   . Hyperglycemia due to type 1 diabetes mellitus ([place]) 07/17/2020  . Diabetic foot ulcer associated with type 1 diabetes mellitus ([place]) 07/17/2020  . Cellulitis 07/14/2020  . Sepsis ([place]) 07/14/2020  . Cystitis   . Scrotal pain   . CAD (coronary artery disease) 01/12/2020  . S/P prostatectomy 01/12/2020  . Severe asthma without complication 43/15/4008  . UTI (urinary tract infection) 01/12/2020  . Possible Postprocedural intraabdominal abscess 01/12/2020  . Prostate cancer ([place]) 01/03/2020  . Preop cardiovascular exam 10/18/2019  . Abdominal bloating 04/12/2019  . Chronic diarrhea 04/12/2019  . Gastritis and duodenitis 04/12/2019  . 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  Elevated PSA 07/25/2017  . Erectile dysfunction associated with type 2 diabetes mellitus ([place]) 07/25/2017  . Hypertension 03/01/2017  . Obstructive sleep apnea on CPAP 03/01/2017  . Chronic systolic CHF (congestive heart failure) ([place]) 11/24/2016  . Shortness of breath   . Abnormal EKG   . DKA (diabetic ketoacidoses) 04/18/2016  . CAP (community acquired pneumonia) 04/18/2016  . Accelerated hypertension 04/18/2016  . Abdominal  pain 04/18/2016  . 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[place]) 08/19/2015  . Poorly controlled type 1 diabetes mellitus ([place]) 08/07/2015  . Required emergent intubation 03/07/2014  . LADA (latent autoimmune diabetes in adults), managed as type 1 ([place]) 09/15/2011  . GERD without esophagitis 08/18/2011  . Hyperlipemia 08/18/2011  . Implantable cardioverter-defibrillator (ICD) in situ 08/18/2011  . Ischemic cardiomyopathy 08/18/2011  . MI (myocardial infarction) ([place]) 03/11/2010   PCP:  [facility], [place]:   [place], [place] [address] [place] [address] Phone: [phone] Fax: [phone]  [id number] [place], [place] - [address] [place] [address] Phone: [phone] Fax: [phone]     Social Determinants of Health (SDOH) Interventions    Readmission Risk Interventions Readmission Risk Prevention Plan 09/22/2020  PCP or Specialist Appt within 3-5 Days Complete  HRI or Home Care Consult Complete  Social Work Consult for [place] Planning/Counseling Complete  Palliative Care Screening Not Applicable  Medication Review Press photographer) Complete  Some recent data might be hidden

## 2020-09-22 NOTE — Progress Notes (Signed)
°   09/22/20 1205  Assess: MEWS Score  Temp (!) 101.6 F (38.7 C)  BP (!) 148/85  Pulse Rate 96  Resp 20  SpO2 98 %  O2 Device Room Air  Assess: MEWS Score  MEWS Temp 2  MEWS Systolic 0  MEWS Pulse 0  MEWS RR 0  MEWS LOC 0  MEWS Score 2  MEWS Score Color Yellow  Assess: if the MEWS score is Yellow or Red  Were vital signs taken at a resting state? Yes  Focused Assessment No change from prior assessment  Early Detection of Sepsis Score *See Row Information* Low  MEWS guidelines implemented *See Row Information* Yes  Treat  MEWS Interventions Administered prn meds/treatments  Take Vital Signs  Increase Vital Sign Frequency  Yellow: Q 2hr X 2 then Q 4hr X 2, if remains yellow, continue Q 4hrs  Escalate  MEWS: Escalate Yellow: discuss with charge nurse/RN and consider discussing with provider and RRT  Notify: Charge Nurse/RN  Name of Charge Nurse/RN Notified Doloris Hall, RN  Date Charge Nurse/RN Notified 09/22/20  Time Charge Nurse/RN Notified 1210  Document  Patient Outcome Other (Comment) (continue to assess)  Progress note created (see row info) Yes

## 2020-09-22 NOTE — Plan of Care (Signed)
  Problem: Education: Goal: Knowledge of General Education information will improve Description: Including pain rating scale, medication(s)/side effects and non-pharmacologic comfort measures Outcome: Progressing   Problem: Clinical Measurements: Goal: Ability to maintain clinical measurements within normal limits will improve Outcome: Progressing Goal: Will remain free from infection Outcome: Progressing Goal: Diagnostic test results will improve Outcome: Progressing Goal: Respiratory complications will improve Outcome: Progressing Goal: Cardiovascular complication will be avoided Outcome: Progressing   Problem: Activity: Goal: Risk for activity intolerance will decrease Outcome: Progressing   Problem: Elimination: Goal: Will not experience complications related to bowel motility Outcome: Progressing Goal: Will not experience complications related to urinary retention Outcome: Progressing

## 2020-09-23 DIAGNOSIS — J1282 Pneumonia due to coronavirus disease 2019: Secondary | ICD-10-CM | POA: Diagnosis not present

## 2020-09-23 DIAGNOSIS — U071 COVID-19: Secondary | ICD-10-CM | POA: Diagnosis not present

## 2020-09-23 LAB — BASIC METABOLIC PANEL
Anion gap: 12 (ref 5–15)
BUN: 26 mg/dL — ABNORMAL HIGH (ref 6–20)
CO2: 20 mmol/L — ABNORMAL LOW (ref 22–32)
Calcium: 8 mg/dL — ABNORMAL LOW (ref 8.9–10.3)
Chloride: 103 mmol/L (ref 98–111)
Creatinine, Ser: 1.52 mg/dL — ABNORMAL HIGH (ref 0.61–1.24)
GFR, Estimated: 56 mL/min — ABNORMAL LOW (ref >60)
Glucose, Bld: 262 mg/dL — ABNORMAL HIGH (ref 70–99)
Potassium: 3.7 mmol/L (ref 3.5–5.1)
Sodium: 135 mmol/L (ref 135–145)

## 2020-09-23 LAB — GLUCOSE, CAPILLARY
Glucose-Capillary: 262 mg/dL — ABNORMAL HIGH (ref 70–99)
Glucose-Capillary: 304 mg/dL — ABNORMAL HIGH (ref 70–99)
Glucose-Capillary: 349 mg/dL — ABNORMAL HIGH (ref 70–99)
Glucose-Capillary: 333 mg/dL — ABNORMAL HIGH (ref 70–99)

## 2020-09-23 LAB — CBC
HCT: 40.9 % (ref 39.0–52.0)
Hemoglobin: 13.9 g/dL (ref 13.0–17.0)
MCH: 30.2 pg (ref 26.0–34.0)
MCHC: 34 g/dL (ref 30.0–36.0)
MCV: 88.7 fL (ref 80.0–100.0)
Platelets: 124 10*3/uL — ABNORMAL LOW (ref 150–400)
RBC: 4.61 MIL/uL (ref 4.22–5.81)
RDW: 12.6 % (ref 11.5–15.5)
WBC: 8 10*3/uL (ref 4.0–10.5)
nRBC: 0 % (ref 0.0–0.2)

## 2020-09-23 LAB — C-REACTIVE PROTEIN: CRP: 20.9 mg/dL — ABNORMAL HIGH (ref <1.0)

## 2020-09-23 LAB — MAGNESIUM: Magnesium: 2.1 mg/dL (ref 1.7–2.4)

## 2020-09-23 MED ORDER — METHYLPREDNISOLONE SODIUM SUCC 40 MG IJ SOLR
40.0000 mg | Freq: Three times a day (TID) | INTRAMUSCULAR | Status: DC
  Administered 2020-09-23 – 2020-09-24 (×2): 40 mg via INTRAVENOUS
  Filled 2020-09-23 (×2): qty 1

## 2020-09-23 MED ORDER — AZITHROMYCIN 500 MG PO TABS
500.0000 mg | ORAL_TABLET | Freq: Every day | ORAL | Status: DC
  Administered 2020-09-23 – 2020-09-24 (×2): 500 mg via ORAL
  Filled 2020-09-23 (×2): qty 1

## 2020-09-23 MED ORDER — INSULIN ASPART 100 UNIT/ML ~~LOC~~ SOLN
5.0000 [IU] | Freq: Three times a day (TID) | SUBCUTANEOUS | Status: DC
  Administered 2020-09-24 (×2): 5 [IU] via SUBCUTANEOUS
  Filled 2020-09-23 (×2): qty 1

## 2020-09-23 MED ORDER — INSULIN ASPART 100 UNIT/ML ~~LOC~~ SOLN: 3.0000 [IU] | Freq: Three times a day (TID) | SUBCUTANEOUS | Status: DC

## 2020-09-23 MED ORDER — SODIUM CHLORIDE 0.9 % IV SOLN: INTRAVENOUS | Status: AC

## 2020-09-23 MED ORDER — HYDROCOD POLST-CPM POLST ER 10-8 MG/5ML PO SUER
5.0000 mL | Freq: Two times a day (BID) | ORAL | Status: DC | PRN
  Administered 2020-09-23: 5 mL via ORAL
  Filled 2020-09-23: qty 5

## 2020-09-23 MED ORDER — INSULIN DETEMIR 100 UNIT/ML ~~LOC~~ SOLN
22.0000 [IU] | Freq: Every day | SUBCUTANEOUS | Status: DC
  Administered 2020-09-23: 23:00:00 22 [IU] via SUBCUTANEOUS
  Filled 2020-09-23 (×2): qty 0.22

## 2020-09-23 MED ORDER — SODIUM CHLORIDE 0.9 % IV SOLN: INTRAVENOUS | Status: DC

## 2020-09-23 NOTE — Progress Notes (Signed)
Inpatient Diabetes Program Recommendations  AACE/ADA: New Consensus Statement on Inpatient Glycemic Control   Target Ranges:  Prepandial:   less than 140 mg/dL      Peak postprandial:   less than 180 mg/dL (1-2 hours)      Critically ill patients:  140 - 180 mg/dL  Results for LESSIE, MANIGO (MRN [id number]) as of 09/23/2020 07:52  Ref. Range 09/23/2020 06:37  Glucose Latest Ref Range: 70 - 99 mg/dL 262 (H)   Results for ZIYAN, SCHOON (MRN [id number]) as of 09/23/2020 07:52  Ref. Range 09/22/2020 08:03 09/22/2020 12:04 09/22/2020 16:19 09/22/2020 20:34 09/22/2020 21:55  Glucose-Capillary Latest Ref Range: 70 - 99 mg/dL 301 (H)  Novolog 11 units 299 (H)  Novolog 8 units 205 (H)  Novolog 5 units 145 (H) 157 (H)    Levemir 18 units   Review of Glycemic Control  Current orders for Inpatient glycemic control: Levemir 18 units QHS, Novolog 0-15 units TID with meals, Novolog 0-5 units QHS, Tradjenta 5 mg daily; Decadron 6 mg daily  Inpatient Diabetes Program Recommendations:    Insulin: If steroids are continued, please consider increasing Levemir to 22 units QHS and ordering Novolog 3 units TID with meals for meal coverage if patient eats at least 50% of meals.  Thanks, Barnie Alderman, RN, MSN, CDE Diabetes Coordinator Inpatient Diabetes Program [phone] (Team Pager from 8am to 5pm)

## 2020-09-23 NOTE — Progress Notes (Signed)
PROGRESS NOTE    Joseph [name]  XTG:[id number] DOB: 05-07-1971 DOA: 09/19/2020 PCP: [facility]    Assessment & Plan:   Principal Problem:   Pneumonia due to COVID-19 virus Active Problems:   Chronic systolic CHF (congestive heart failure) ([place])   Hypertension   Implantable cardioverter-defibrillator (ICD) in situ   Ischemic cardiomyopathy   Diabetes mellitus without complication (HCC)   Joseph [name] is a 49 y.o. male with medical history significant for insulin-dependent diabetes mellitus, seizure disorder, hypertension, chronic systolic heart failure with a last known LVEF of 25 to 30%, ischemic cardiomyopathy status post AICD insertion who presents to the ER for evaluation of a 5 to 6-day history of myalgias, fatigue, nausea, decreased oral intake, abdominal pain, nonproductive cough, fever, chills and diarrhea.  Patient states that his symptoms have continued to worsen and that over the last couple of days he has been unable to tolerate any oral intake. Patient received 2 doses of the Moderna vaccine but has not received his booster shot.  Persistent high fevers --presumed due to COVID infection. --procal has been neg, D-dimer not very elevated.  RVP neg. --blood cx x2 obtained. Plan: --tylenol and Advil PRN   COVID-19 infection --admitting provider discussed with infectious disease physician who recommended to administer monoclonal antibody, however, it was not given for some reason.  Remdesivir was not started on admission. --Procalcitonin levels are within normal limits Patient is not hypoxic at this time and at rest has room air pulse oximetry of 95% and with exertion room air pulse oximetry is remained in the upper 90s. --CRP 10.5 on presentation, trending up to 20.9 today despite being on oral decadron Plan: --monitor O2 requriement --increase steroid to solumedrol 40 mg q8h  --monitor and ensure CRP down-trending  N/V/D, improved --due to COVID  gastroenteritis --C diff neg --Cr bumped this morning likely due to dehydration caused by vomiting and diarrhea yesterday. Plan: --NS@100  for 10 hours --Imodium PRN  Bronchopneumonia --started on ceftriaxone and azithro on presentation. --procal neg Plan: --cont ceftriaxone and azithromycin since pt has persistent fever, will finish 5 days.  Chronic systolic CHF Last known LVEF of 25 to 30%.  Appeared stable. --on home furosemide, spironolactone, carvedilol and losartan Plan: --Hold home lasix and spironolactone today due to Cr bump caused by dehydration --cont home coreg and losartan --NS@100  for 10 hours  Diabetes mellitus poorly controlled Hyperglycemia exacerbated by steroid --A1c 9.5 --increase Levemir to 22u nightly --add mealtime 5u TID --cont SSI --cont Tradjenta  Ischemic cardiomyopathy Status post AICD insertion --cont home coreg and losartan   DVT prophylaxis: Lovenox SQ Code Status: Full code  Family Communication:  Status is: inpatient Dispo:   The patient is from: home Anticipated d/c is to: home Anticipated d/c date is: 1-2 days Patient currently is not medically stable to d/c due to: covid with GI symptoms, can't tolerate oral intake due to N/V.  Also CRP trending up which confers risk of decline.   Subjective and Interval History:  No vomiting or diarrhea today.  Ate some food, but felt full easily.  Still on room air.  Cr bumped from likely dehydration from yesterday's vomiting and diarrhea.  Ordered IVF.   Objective: Vitals:   09/23/20 0651 09/23/20 0828 09/23/20 1116 09/23/20 1531  BP:  131/78 125/71 119/77  Pulse:  88 86 90  Resp:  18 16 18   Temp: 98.6 F (37 C) 98.8 F (37.1 C) 98.7 F (37.1 C) 98.4 F (36.9 C)  TempSrc: Oral Oral Oral   SpO2:  96% 94% 96%  Weight:      Height:        Intake/Output Summary (Last 24 hours) at 09/23/2020 1715 Last data filed at 09/23/2020 1536 Gross per 24 hour  Intake 380.38 ml  Output  --  Net 380.38 ml   Filed Weights   09/19/20 1009 09/19/20 2218 09/20/20 0344  Weight: 91.1 kg 91.8 kg 90.5 kg    Examination:   Constitutional: NAD, AAOx3 HEENT: conjunctivae and lids normal, EOMI CV: No cyanosis.   RESP: normal respiratory effort, on RA Extremities: No effusions, edema in BLE SKIN: warm, dry and intact Neuro: II - XII grossly intact.   Psych: Normal mood and affect.  Appropriate judgement and reason   Data Reviewed: I have personally reviewed following labs and imaging studies  CBC: Recent Labs  Lab 09/19/20 1017 09/20/20 0526 09/21/20 0505 09/22/20 0448 09/23/20 0637  WBC 5.2 4.3 3.6* 5.3 8.0  NEUTROABS  --  2.3  --   --   --   HGB 15.7 13.8 13.7 13.7 13.9  HCT 44.2 40.6 38.4* 38.9* 40.9  MCV 87.5 88.8 86.7 88.4 88.7  PLT 146* 115* 114* 108* 027*   Basic Metabolic Panel: Recent Labs  Lab 09/19/20 1017 09/20/20 0526 09/21/20 0505 09/22/20 0448 09/23/20 0637  NA 135 136 135 135 135  K 4.3 4.1 3.7 3.7 3.7  CL 101 106 103 103 103  CO2 21* 19* 24 24 20*  GLUCOSE 288* 311* 344* 234* 262*  BUN 25* 25* 17 17 26*  CREATININE 1.14 1.22 1.05 1.23 1.52*  CALCIUM 8.6* 7.9* 7.9* 7.9* 8.0*  MG  --  2.1 2.0 1.9 2.1  PHOS  --  3.0  --   --   --    GFR: Estimated Creatinine Clearance: 66.4 mL/min (A) (by C-G formula based on SCr of 1.52 mg/dL (H)). Liver Function Tests: Recent Labs  Lab 09/19/20 1017 09/20/20 0526  AST 32 26  ALT 27 21  ALKPHOS 102 86  BILITOT 1.0 1.1  PROT 7.3 6.3*  ALBUMIN 3.3* 2.8*   Recent Labs  Lab 09/19/20 1017  LIPASE 18   No results for input(s): AMMONIA in the last 168 hours. Coagulation Profile: No results for input(s): INR, PROTIME in the last 168 hours. Cardiac Enzymes: No results for input(s): CKTOTAL, CKMB, CKMBINDEX, TROPONINI in the last 168 hours. BNP (last 3 results) No results for input(s): PROBNP in the last 8760 hours. HbA1C: No results for input(s): HGBA1C in the last 72 hours. CBG: Recent  Labs  Lab 09/22/20 2034 09/22/20 2155 09/23/20 0806 09/23/20 1130 09/23/20 1630  GLUCAP 145* 157* 262* 304* 349*   Lipid Profile: No results for input(s): CHOL, HDL, LDLCALC, TRIG, CHOLHDL, LDLDIRECT in the last 72 hours. Thyroid Function Tests: No results for input(s): TSH, T4TOTAL, FREET4, T3FREE, THYROIDAB in the last 72 hours. Anemia Panel: No results for input(s): VITAMINB12, FOLATE, FERRITIN, TIBC, IRON, RETICCTPCT in the last 72 hours. Sepsis Labs: Recent Labs  Lab 09/19/20 1017 09/20/20 0526 09/21/20 0505  PROCALCITON 0.12 0.10 <0.10    Recent Results (from the past 240 hour(s))  Resp Panel by RT-PCR (Flu A&B, Covid) Nasopharyngeal Swab     Status: Abnormal   Collection Time: 09/19/20 12:54 PM   Specimen: Nasopharyngeal Swab; Nasopharyngeal(NP) swabs in vial transport medium  Result Value Ref Range Status   SARS Coronavirus 2 by RT PCR POSITIVE (A) NEGATIVE Final    Comment: RESULT CALLED  TO, READ BACK BY AND VERIFIED WITH: JANE MARTIN 09/19/20 1442 KLW (NOTE) SARS-CoV-2 target nucleic acids are DETECTED.  The SARS-CoV-2 RNA is generally detectable in upper respiratory specimens during the acute phase of infection. Positive results are indicative of the presence of the identified virus, but do not rule out bacterial infection or co-infection with other pathogens not detected by the test. Clinical correlation with patient history and other diagnostic information is necessary to determine patient infection status. The expected result is Negative.  Fact Sheet for Patients: EntrepreneurPulse.com.au  Fact Sheet for Healthcare Providers: IncredibleEmployment.be  This test is not yet approved or cleared by the [place] FDA and  has been authorized for detection and/or diagnosis of SARS-CoV-2 by FDA under an Emergency Use Authorization (EUA).  This EUA will remain in effect (meaning this test can be use d) for the duration of   the COVID-19 declaration under Section 564(b)(1) of the Act, 21 U.S.C. section 360bbb-3(b)(1), unless the authorization is terminated or revoked sooner.     Influenza A by PCR NEGATIVE NEGATIVE Final   Influenza B by PCR NEGATIVE NEGATIVE Final    Comment: (NOTE) The Xpert Xpress SARS-CoV-2/FLU/RSV plus assay is intended as an aid in the diagnosis of influenza from Nasopharyngeal swab specimens and should not be used as a sole basis for treatment. Nasal washings and aspirates are unacceptable for Xpert Xpress SARS-CoV-2/FLU/RSV testing.  Fact Sheet for Patients: EntrepreneurPulse.com.au  Fact Sheet for Healthcare Providers: IncredibleEmployment.be  This test is not yet approved or cleared by the [place] FDA and has been authorized for detection and/or diagnosis of SARS-CoV-2 by FDA under an Emergency Use Authorization (EUA). This EUA will remain in effect (meaning this test can be used) for the duration of the COVID-19 declaration under Section 564(b)(1) of the Act, 21 U.S.C. section 360bbb-3(b)(1), unless the authorization is terminated or revoked.  Performed at [facility], [place], [place] [address]   C Difficile Quick Screen w PCR reflex     Status: None   Collection Time: 09/22/20  9:48 AM   Specimen: STOOL  Result Value Ref Range Status   C Diff antigen NEGATIVE NEGATIVE Final   C Diff toxin NEGATIVE NEGATIVE Final   C Diff interpretation No C. difficile detected.  Final    Comment: Performed at [facility], [address] [place], [place] [address]  Respiratory Panel by PCR     Status: None   Collection Time: 09/22/20 10:28 AM   Specimen: Nasopharyngeal Swab; Respiratory  Result Value Ref Range Status   Adenovirus NOT DETECTED NOT DETECTED Final   Coronavirus 229E NOT DETECTED NOT DETECTED Final    Comment: (NOTE) The Coronavirus on the Respiratory Panel, DOES NOT test for the novel   Coronavirus (2019 nCoV)    Coronavirus HKU1 NOT DETECTED NOT DETECTED Final   Coronavirus NL63 NOT DETECTED NOT DETECTED Final   Coronavirus OC43 NOT DETECTED NOT DETECTED Final   Metapneumovirus NOT DETECTED NOT DETECTED Final   Rhinovirus / Enterovirus NOT DETECTED NOT DETECTED Final   Influenza A NOT DETECTED NOT DETECTED Final   Influenza B NOT DETECTED NOT DETECTED Final   Parainfluenza Virus 1 NOT DETECTED NOT DETECTED Final   Parainfluenza Virus 2 NOT DETECTED NOT DETECTED Final   Parainfluenza Virus 3 NOT DETECTED NOT DETECTED Final   Parainfluenza Virus 4 NOT DETECTED NOT DETECTED Final   Respiratory Syncytial Virus NOT DETECTED NOT DETECTED Final   Bordetella pertussis NOT DETECTED NOT DETECTED  Final   Bordetella Parapertussis NOT DETECTED NOT DETECTED Final   Chlamydophila pneumoniae NOT DETECTED NOT DETECTED Final   Mycoplasma pneumoniae NOT DETECTED NOT DETECTED Final    Comment: Performed at [facility], [place] [address], [place], [place] [address]  Culture, blood (Routine X 2) w Reflex to ID Panel     Status: None (Preliminary result)   Collection Time: 09/22/20  5:29 PM   Specimen: BLOOD  Result Value Ref Range Status   Specimen Description BLOOD LEFT ANTECUBITAL  Final   Special Requests   Final    BOTTLES DRAWN AEROBIC AND ANAEROBIC Blood Culture adequate volume   Culture   Final    NO GROWTH < 12 HOURS Performed at [facility], [address] [place], [place] [address]    Report Status PENDING  Incomplete  Culture, blood (Routine X 2) w Reflex to ID Panel     Status: None (Preliminary result)   Collection Time: 09/22/20  5:40 PM   Specimen: BLOOD  Result Value Ref Range Status   Specimen Description BLOOD BLOOD LEFT HAND  Final   Special Requests   Final    BOTTLES DRAWN AEROBIC AND ANAEROBIC Blood Culture adequate volume   Culture   Final    NO GROWTH < 12 HOURS Performed at [facility], [address] [place], [place]  [address]    Report Status PENDING  Incomplete      Radiology Studies: No results found.   Scheduled Meds: . albuterol  2 puff Inhalation Q6H  . vitamin C  500 mg Oral Daily  . aspirin EC  81 mg Oral BH-q7a  . atorvastatin  40 mg Oral QPM  . azithromycin  500 mg Oral Daily  . carvedilol  12.5 mg Oral Daily  . cholecalciferol  1,000 Units Oral Daily  . dexamethasone  6 mg Oral Daily  . digoxin  125 mcg Oral Daily  . enoxaparin (LOVENOX) injection  40 mg Subcutaneous Q24H  . insulin aspart  0-15 Units Subcutaneous TID WC  . insulin aspart  0-5 Units Subcutaneous QHS  . insulin detemir  18 Units Subcutaneous QHS  . linagliptin  5 mg Oral Daily  . loratadine  10 mg Oral Daily  . losartan  100 mg Oral QHS  . potassium chloride  10 mEq Oral Daily  . sodium chloride flush  3 mL Intravenous Q12H  . zinc sulfate  220 mg Oral Daily   Continuous Infusions: . sodium chloride    . sodium chloride    . sodium chloride 100 mL/hr at 09/23/20 1147  . casirivimab-imdevimab (REGEN-COV) IVPB    . cefTRIAXone (ROCEPHIN)  IV Stopped (09/23/20 0706)     LOS: 4 days     Enzo Bi, MD [facility] If 7PM-7AM, please contact night-coverage 09/23/2020, 5:15 PM

## 2020-09-24 DIAGNOSIS — A0839 Other viral enteritis: Secondary | ICD-10-CM

## 2020-09-24 DIAGNOSIS — I255 Ischemic cardiomyopathy: Secondary | ICD-10-CM

## 2020-09-24 DIAGNOSIS — I5022 Chronic systolic (congestive) heart failure: Secondary | ICD-10-CM | POA: Diagnosis not present

## 2020-09-24 DIAGNOSIS — U071 COVID-19: Secondary | ICD-10-CM | POA: Diagnosis not present

## 2020-09-24 DIAGNOSIS — J1282 Pneumonia due to coronavirus disease 2019: Secondary | ICD-10-CM | POA: Diagnosis not present

## 2020-09-24 LAB — CBC
HCT: 39.1 % (ref 39.0–52.0)
Hemoglobin: 13.8 g/dL (ref 13.0–17.0)
MCH: 30.8 pg (ref 26.0–34.0)
MCHC: 35.3 g/dL (ref 30.0–36.0)
MCV: 87.3 fL (ref 80.0–100.0)
Platelets: 158 10*3/uL (ref 150–400)
RBC: 4.48 MIL/uL (ref 4.22–5.81)
RDW: 12.3 % (ref 11.5–15.5)
WBC: 6 10*3/uL (ref 4.0–10.5)
nRBC: 0 % (ref 0.0–0.2)

## 2020-09-24 LAB — BASIC METABOLIC PANEL
Anion gap: 9 (ref 5–15)
BUN: 29 mg/dL — ABNORMAL HIGH (ref 6–20)
CO2: 23 mmol/L (ref 22–32)
Calcium: 8.5 mg/dL — ABNORMAL LOW (ref 8.9–10.3)
Chloride: 103 mmol/L (ref 98–111)
Creatinine, Ser: 1.14 mg/dL (ref 0.61–1.24)
GFR, Estimated: >60 mL/min (ref >60)
Glucose, Bld: 360 mg/dL — ABNORMAL HIGH (ref 70–99)
Potassium: 5.1 mmol/L (ref 3.5–5.1)
Sodium: 135 mmol/L (ref 135–145)

## 2020-09-24 LAB — GLUCOSE, CAPILLARY
Glucose-Capillary: 331 mg/dL — ABNORMAL HIGH (ref 70–99)
Glucose-Capillary: 443 mg/dL — ABNORMAL HIGH (ref 70–99)

## 2020-09-24 LAB — MAGNESIUM: Magnesium: 2.4 mg/dL (ref 1.7–2.4)

## 2020-09-24 LAB — C-REACTIVE PROTEIN
CRP: 18.6 mg/dL — ABNORMAL HIGH (ref <1.0)
CRP: 19.4 mg/dL — ABNORMAL HIGH (ref <1.0)

## 2020-09-24 MED ORDER — CEFDINIR 300 MG PO CAPS: 300.0000 mg | ORAL_CAPSULE | Freq: Two times a day (BID) | ORAL | 0 refills | Status: AC

## 2020-09-24 MED ORDER — DEXAMETHASONE 6 MG PO TABS: 6.0000 mg | ORAL_TABLET | Freq: Every day | ORAL | 0 refills | Status: AC

## 2020-09-24 MED ORDER — METHYLPREDNISOLONE SODIUM SUCC 40 MG IJ SOLR: 40.0000 mg | Freq: Two times a day (BID) | INTRAMUSCULAR | Status: DC

## 2020-09-24 MED ORDER — INSULIN ASPART 100 UNIT/ML ~~LOC~~ SOLN
20.0000 [IU] | Freq: Once | SUBCUTANEOUS | Status: AC
  Administered 2020-09-24: 12:00:00 20 [IU] via SUBCUTANEOUS
  Filled 2020-09-24: qty 1

## 2020-09-24 MED ORDER — ASCORBIC ACID 500 MG PO TABS: 500.0000 mg | ORAL_TABLET | Freq: Every day | ORAL | 0 refills | Status: AC

## 2020-09-24 MED ORDER — BARICITINIB 2 MG PO TABS
4.0000 mg | ORAL_TABLET | Freq: Once | ORAL | Status: AC
  Administered 2020-09-24: 4 mg via ORAL
  Filled 2020-09-24: qty 2

## 2020-09-24 MED ORDER — ZINC SULFATE 220 (50 ZN) MG PO CAPS: 220.0000 mg | ORAL_CAPSULE | Freq: Every day | ORAL | 0 refills | Status: AC

## 2020-09-24 NOTE — Progress Notes (Signed)
MD aware BS [phone]

## 2020-09-24 NOTE — TOC Progression Note (Signed)
Transition of Care [facility]) - Progression Note    Patient Details  Name: BASTIAN ANDREOLI MRN: [id number] Date of Birth: 1971/05/24  [facility]) CM/SW Contact  Shelbie Hutching, RN Phone Number: 09/24/2020, 10:24 AM  Clinical Narrative:     Attempted to speak with patient again via phone with no answer.  Per chart patient is current with PCP at [facility] and also gets prescriptions there.   Expected Discharge Plan: Home/Self Care Barriers to Discharge: Continued Medical Work up  Expected Discharge Plan and Services Expected Discharge Plan: Home/Self Care       Living arrangements for the past 2 months: Single Family Home                                       Social Determinants of Health (SDOH) Interventions    Readmission Risk Interventions Readmission Risk Prevention Plan 09/22/2020  PCP or Specialist Appt within 3-5 Days Complete  HRI or [place] Complete  Social Work Consult for [place] Planning/Counseling Complete  Palliative Care Screening Not Applicable  Medication Review Press photographer) Complete  Some recent data might be hidden

## 2020-09-24 NOTE — Discharge Summary (Signed)
Physician Discharge Summary  Patient ID: Joseph [name] MRN: [id number] DOB/AGE: 1971-03-05 49 y.o.  Admit date: 09/19/2020 Discharge date: 09/24/2020  Admission Diagnoses:  Discharge Diagnoses:  Principal Problem:   Pneumonia due to COVID-19 virus Active Problems:   Chronic systolic CHF (congestive heart failure) (HCC)   Hypertension   Implantable cardioverter-defibrillator (ICD) in situ   Ischemic cardiomyopathy   Diabetes mellitus without complication [facility])   Discharged Condition: good  Hospital Course:  Joseph Mad Sladeis a 49 y.o.malewith medical history significant forinsulin-dependent diabetes mellitus,seizure disorder, hypertension, chronic systolic heart failure with a last known LVEF of 25 to 30%, ischemic cardiomyopathy status post AICD insertion who presents to the ER for evaluation of a 5 to 6-day history of myalgias, fatigue, nausea, decreased oral intake, abdominal pain, nonproductive cough, fever, chills and diarrhea. Patient states that his symptoms have continued to worsen and that over the last couple of days he has been unable to tolerate any oral intake. Patient received 2 doses ofthe Moderna vaccinebut has not received his boostershot.  #1.  COVID-19 infection. Persistent fever. Fever was presumed to be secondary to Covid. Patient did not receive remdesivir or neb due to patient does not have any hypoxemia. He was a found to have significant elevation in CRP, he is started on IV steroids.  I will continue a few days of Decadron. Patient was also given a dose of baricitinib for elevation of CRP. Point, patient is symptomatically improved, he refused to stay in the hospital.  He will be followed by his PCP in the near future.  2.  Gastroenteritis secondary to Covid. Negative C. difficile.  Condition had improved.  3.  Bronchopneumonia. Patient was treated with Rocephin and Zithromax. Continue cefdinir for next 2 days per  4.  Uncontrolled type 2  diabetes with hyperglycemia. Hemoglobin A1c 9.5.  Worsening glucose due to steroids. We will resume home dose insulin which is much higher than hospital dose.  #5 ischemic cardiomyopathy with chronic systolic congestive heart failure. Continue home medicines.  Follow-up with PCP as outpatient.  6.  Diabetes foot ulcer. Refer to podiatry as outpatient.    Consults: None  Significant Diagnostic Studies:   CHEST - 2 VIEW  COMPARISON:  07/14/2020  FINDINGS: Pacemaker/AICD appears unchanged. There is acute perihilar bronchopneumonia in the right upper lobe. Left lung is clear except for mild scarring or atelectasis at the base. No effusion. No significant bone finding.  IMPRESSION: Acute perihilar bronchopneumonia in the right upper lobe.   Electronically Signed   By: Nelson Chimes M.D.   On: 09/19/2020 10:48   Treatments: IV steroids and antibiotics  Discharge Exam: Blood pressure 128/83, pulse 100, temperature 97.8 F (36.6 C), temperature source Oral, resp. rate 18, height 6\' 1"  (1.854 m), weight 90.5 kg, SpO2 95 %. General appearance: alert and cooperative Resp: clear to auscultation bilaterally Cardio: regular rate and rhythm, S1, S2 normal, no murmur, click, rub or gallop GI: soft, non-tender; bowel sounds normal; no masses,  no organomegaly Extremities: extremities normal, atraumatic, no cyanosis or edema  Disposition: Discharge disposition: 01-Home or Self Care       Discharge Instructions    Diet - low sodium heart healthy   Complete by: As directed    Discharge wound care:   Complete by: As directed    Refer to poddiatry   Increase activity slowly   Complete by: As directed      Allergies as of 09/24/2020      Reactions   Vancomycin  Shortness Of Breath   Lisinopril Rash      Medication List    STOP taking these medications   Klor-Con M10 10 MEQ tablet Generic drug: potassium chloride     TAKE these medications   acetaminophen 500  MG tablet Commonly known as: TYLENOL Take 500 mg by mouth every 6 (six) hours as needed for fever.   albuterol 108 (90 Base) MCG/ACT inhaler Commonly known as: Proventil HFA Inhale 2 puffs into the lungs every 4 (four) hours as needed for wheezing or shortness of breath.   ascorbic acid 500 MG tablet Commonly known as: VITAMIN C Take 1 tablet (500 mg total) by mouth daily for 14 days. Start taking on: September 25, 2020   aspirin EC 81 MG tablet Take 81 mg by mouth every morning.   atorvastatin 40 MG tablet Commonly known as: LIPITOR Take 40 mg by mouth daily.   B-D ULTRAFINE III SHORT PEN 31G X 8 MM Misc Generic drug: Insulin Pen Needle USE 5 TIMES DAILY AS  DIRECTED   carvedilol 12.5 MG tablet Commonly known as: COREG Take 12.5 mg by mouth daily.   cefdinir 300 MG capsule Commonly known as: OMNICEF Take 1 capsule (300 mg total) by mouth 2 (two) times daily for 2 days.   D3-1000 25 MCG (1000 UT) capsule Generic drug: Cholecalciferol Take 1,000 Units by mouth daily.   dexamethasone 6 MG tablet Commonly known as: DECADRON Take 1 tablet (6 mg total) by mouth daily for 5 days.   digoxin 0.125 MG tablet Commonly known as: LANOXIN Take 1 tablet (125 mcg total) by mouth daily.   fluticasone 50 MCG/ACT nasal spray Commonly known as: FLONASE Place 2 sprays into both nostrils daily as needed for allergies.   furosemide 20 MG tablet Commonly known as: LASIX Take 1 tablet (20 mg total) by mouth 2 (two) times daily.   GlucaGen Diagnostic 1 MG injection Generic drug: glucagon (human recombinant) Inject into the muscle.   glucagon 1 MG injection Inject into the muscle.   insulin aspart 100 UNIT/ML FlexPen Commonly known as: NOVOLOG Inject 18-20-22 u TID AC plus sliding scale for up to 75 units TDD   loratadine 10 MG tablet Commonly known as: CLARITIN Take 10 mg by mouth daily as needed.   losartan 100 MG tablet Commonly known as: COZAAR Take 1 tablet (100 mg  total) by mouth at bedtime.   naproxen sodium 220 MG tablet Commonly known as: ALEVE Take 220 mg by mouth daily as needed.   spironolactone 25 MG tablet Commonly known as: ALDACTONE Take 0.5 tablets (12.5 mg total) by mouth daily.   Symbicort 160-4.5 MCG/ACT inhaler Generic drug: budesonide-formoterol Inhale 2 puffs into the lungs 2 (two) times daily.   Tyler Aas FlexTouch 200 UNIT/ML FlexTouch Pen Generic drug: insulin degludec Inject 50 Units into the skin daily.   zinc sulfate 220 (50 Zn) MG capsule Take 1 capsule (220 mg total) by mouth daily for 14 days. Start taking on: September 25, 2020            Discharge Care Instructions  (From admission, onward)         Start     Ordered   09/24/20 0000  Discharge wound care:       Comments: Refer to poddiatry   09/24/20 [place], [facility] Follow up in 1 week(s).   Specialty: General Practice Contact information: [phone]  [place] [address] [phone]        Sharlotte Alamo, DPM Follow up in 2 week(s).   Specialty: Podiatry Contact information: [place] [address] [phone]               Signed: Sharen Hones 09/24/2020, 1:01 PM

## 2020-09-24 NOTE — Progress Notes (Signed)
Inpatient Diabetes Program Recommendations  AACE/ADA: New Consensus Statement on Inpatient Glycemic Control   Target Ranges:  Prepandial:   less than 140 mg/dL      Peak postprandial:   less than 180 mg/dL (1-2 hours)      Critically ill patients:  140 - 180 mg/dL   Results for Joseph [name], Joseph [name] (MRN [id number]) as of 09/24/2020 08:05  Ref. Range 09/23/2020 08:06 09/23/2020 11:30 09/23/2020 16:30 09/23/2020 20:34 09/24/2020 08:00  Glucose-Capillary Latest Ref Range: 70 - 99 mg/dL 262 (H) 304 (H) 349 (H) 333 (H) 331 (H)   Review of Glycemic Control  Current orders for Inpatient glycemic control:Levemir 22 units QHS, Novolog 0-15 units TID with meals, Novolog 5 units TID with meals, Tradjenta 5 mg daily; Solumedrol 40 mg Q12H  Inpatient Diabetes Program Recommendations:  Insulin: If steroids are continued, please consider increasing Levemir to 30 units QHS and meal coverage to Novolog 10 units TID with meals if patient eats at least 50% of meals.  Also, please order Novolog 0-5 units QHS for bedtime correction.  Thanks, Barnie Alderman, RN, MSN, CDE Diabetes Coordinator Inpatient Diabetes Program [phone] (Team Pager from 8am to 5pm)

## 2020-09-24 NOTE — Progress Notes (Signed)
Pt being discharged, discharge instructions reviewed with pt, states understanding, pt with no complaints, awaiting ride for transport home

## 2020-09-24 NOTE — Discharge Instructions (Signed)
Self quarantine for 14 days. Follow-up with PCP in 1 week.

## 2020-09-24 NOTE — Progress Notes (Signed)
Per MD give total 25 units novolog for BS 443 at lunch

## 2020-09-27 LAB — CULTURE, BLOOD (ROUTINE X 2)
Culture: NO GROWTH
Culture: NO GROWTH
Special Requests: ADEQUATE
Special Requests: ADEQUATE

## 2020-12-26 ENCOUNTER — Ambulatory Visit (INDEPENDENT_AMBULATORY_CARE_PROVIDER_SITE_OTHER): Payer: 59

## 2020-12-26 ENCOUNTER — Other Ambulatory Visit: Payer: Self-pay

## 2020-12-26 ENCOUNTER — Encounter: Payer: Self-pay | Admitting: Podiatry

## 2020-12-26 ENCOUNTER — Ambulatory Visit (INDEPENDENT_AMBULATORY_CARE_PROVIDER_SITE_OTHER): Payer: 59 | Admitting: Podiatry

## 2020-12-26 DIAGNOSIS — E0843 Diabetes mellitus due to underlying condition with diabetic autonomic (poly)neuropathy: Secondary | ICD-10-CM

## 2020-12-26 DIAGNOSIS — L97512 Non-pressure chronic ulcer of other part of right foot with fat layer exposed: Secondary | ICD-10-CM

## 2020-12-26 DIAGNOSIS — L98491 Non-pressure chronic ulcer of skin of other sites limited to breakdown of skin: Secondary | ICD-10-CM

## 2020-12-26 MED ORDER — GENTAMICIN SULFATE 0.1 % EX CREA: 1.0000 "application " | TOPICAL_CREAM | Freq: Two times a day (BID) | CUTANEOUS | 1 refills | Status: DC

## 2020-12-26 NOTE — Progress Notes (Signed)
Subjective:  50 y.o. male with PMHx of diabetes mellitus presenting to the office today for evaluation of a callus that developed over the past year to the right foot.  Patient states that over the past year he is been in between jobs and has not had insurance.  He recently now has insurance and he presents to have his callus evaluated.  He has seen podiatry at [facility] clinic approximately 1 year ago for the same issue.   Past Medical History:  Diagnosis Date  . AICD (automatic cardioverter/defibrillator) present 2011  . Asthma   . Cancer [facility])    prostate  . CHF (congestive heart failure) ([place])   . Coronary artery disease   . Diabetes mellitus without complication ([place])   . Dyspnea   . ED (erectile dysfunction)   . GERD (gastroesophageal reflux disease)   . Hyperlipidemia   . Hypertension   . Ischemic cardiomyopathy   . LADA (latent autoimmune diabetes in adults), managed as type 1 ([place])   . Myocardial infarction [facility]) 2011   anterior MI at [place] s/p BMS to ostial/proximal LAD  . Neuromuscular disorder ([place])    problems in arms. finger tips have been numb/tingling. voltaren works  . Obstructive sleep apnea 2018   cpap. has not used lately. uses a breathe-right strip on nose  . Seizures ([place])    diabetes seizures (low blood sugar); last one 08/2018  . Systolic heart failure ([place])   . Vitamin D deficiency         Objective/Physical Exam General: The patient is alert and oriented x3 in no acute distress.  Dermatology:  Wound #1 noted to the subfirst MTPJ right foot measuring approximately 3.0 x 3.0 x 0.2 cm (LxWxD).   To the noted ulceration(s), there is no eschar. There is a moderate amount of slough, fibrin, and necrotic tissue noted. Granulation tissue and wound base is red. There is a minimal amount of serosanguineous drainage noted. There is no exposed bone muscle-tendon ligament or joint. There is no malodor. Periwound integrity is intact. Skin is warm, dry and  supple bilateral lower extremities.  Vascular: Palpable pedal pulses bilaterally. No edema or erythema noted. Capillary refill within normal limits.  Neurological: Epicritic and protective threshold diminished bilaterally.   Musculoskeletal Exam: Range of motion within normal limits to all pedal and ankle joints bilateral. Muscle strength 5/5 in all groups bilateral.   Radiographic exam: Normal osseous mineralization.  No fractures identified.  No erosion of the cortices.  No evidence of OM.  Joint spaces preserved.  Assessment: 1.  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 diabetes mellitus w/ peripheral neuropathy   Plan of Care:  1. Patient was evaluated. 2. medically necessary excisional debridement including subcutaneous tissue was performed using a tissue nipper and a chisel blade. Excisional debridement of all the necrotic nonviable tissue down to healthy bleeding viable tissue was performed with post-debridement measurements same as pre-. 3. the wound was cleansed and dry sterile dressing applied. 4.  Prescription for gentamicin cream applied daily  5.  Postsurgical shoe dispensed.  Wear daily  6.  Patient states that his last A1c levels were approximately 10 and that was about 1 year ago.  He has not been able to follow-up with the PCP since he has not had insurance.  Stressed the importance of maintaining blood sugar and reducing his glucose levels.  Stressed the importance of him following up with his PCP soon as possible  7.  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 He used to work for [employer].    Edrick Kins, DPM [facility]  Dr. Edrick Kins, DPM    [address], [place] [address]                Office [phone]  Fax [phone]

## 2021-01-16 ENCOUNTER — Other Ambulatory Visit: Payer: Self-pay

## 2021-01-16 ENCOUNTER — Ambulatory Visit (INDEPENDENT_AMBULATORY_CARE_PROVIDER_SITE_OTHER): Payer: 59 | Admitting: Podiatry

## 2021-01-16 ENCOUNTER — Encounter: Payer: Self-pay | Admitting: Podiatry

## 2021-01-16 DIAGNOSIS — L97512 Non-pressure chronic ulcer of other part of right foot with fat layer exposed: Secondary | ICD-10-CM | POA: Diagnosis not present

## 2021-01-16 DIAGNOSIS — E0843 Diabetes mellitus due to underlying condition with diabetic autonomic (poly)neuropathy: Secondary | ICD-10-CM

## 2021-01-16 NOTE — Progress Notes (Signed)
Subjective:  50 y.o. male with PMHx of diabetes mellitus presenting to the office today for follow-up evaluation of an ulcer to the subfirst MTPJ of the right foot.  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  He wears composite toe with Dr. Felicie Morn insoles to help alleviate pressure from the ulcer.  He has been applying Betadine and hydrogen peroxide to the wound since last visit.  He did not get the prescription for the gentamicin cream   Past Medical History:  Diagnosis Date  . AICD (automatic cardioverter/defibrillator) present 2011  . Asthma   . Cancer [facility])    prostate  . CHF (congestive heart failure) ([place])   . Coronary artery disease   . Diabetes mellitus without complication ([place])   . Dyspnea   . ED (erectile dysfunction)   . GERD (gastroesophageal reflux disease)   . Hyperlipidemia   . Hypertension   . Ischemic cardiomyopathy   . LADA (latent autoimmune diabetes in adults), managed as type 1 ([place])   . Myocardial infarction [facility]) 2011   anterior MI at [place] s/p BMS to ostial/proximal LAD  . Neuromuscular disorder ([place])    problems in arms. finger tips have been numb/tingling. voltaren works  . Obstructive sleep apnea 2018   cpap. has not used lately. uses a breathe-right strip on nose  . Seizures ([place])    diabetes seizures (low blood sugar); last one 08/2018  . Systolic heart failure ([place])   . Vitamin D deficiency    Objective/Physical Exam General: The patient is alert and oriented x3 in no acute distress.  Dermatology:  Wound #1 noted to the subfirst MTPJ right foot measuring approximately 1.5 x 1.5 x 0.2 cm (LxWxD).   To the noted ulceration(s), there is no eschar. There is a moderate amount of slough, fibrin, and necrotic tissue noted. Granulation tissue and wound base is red. There is a minimal amount of serosanguineous drainage noted. There  is no exposed bone muscle-tendon ligament or joint. There is no malodor. Periwound integrity is intact. Skin is warm, dry and supple bilateral lower extremities.  Vascular: Palpable pedal pulses bilaterally. No edema or erythema noted. Capillary refill within normal limits.  Neurological: Epicritic and protective threshold diminished bilaterally.   Musculoskeletal Exam: Range of motion within normal limits to all pedal and ankle joints bilateral. Muscle strength 5/5 in all groups bilateral.   Assessment: 1.  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 diabetes mellitus w/ peripheral neuropathy   Plan of Care:  1. Patient was evaluated. 2. medically necessary excisional debridement including subcutaneous tissue was performed using a tissue nipper and a chisel blade. Excisional debridement of all the necrotic nonviable tissue down to healthy bleeding viable tissue was performed with post-debridement measurements same as pre-. 3. the wound was cleansed and dry sterile dressing applied. 4.  Betadine ointment was provided for the patient to apply daily.   5.  Offloading dancers felt pads were applied to the insoles of the patient's work boots.  This will help alleviate pressure from the first MTPJ  6.  Patient has not followed up with his PCP to have his diabetes check.  Patient states that his last A1c levels were approximately 10 and that was about 1 year ago.  He has not been able to follow-up with the PCP since he has not had  insurance.  Stressed the importance of maintaining blood sugar and reducing his glucose levels.  Stressed the importance of him following up with his PCP soon as possible  7.  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 He used to work for [employer].  Currently working as a Scientist, forensic   Edrick Kins, DPM [facility]  Dr. Edrick Kins, DPM    [address], [place] [address]                Office [phone]  Fax [phone]

## 2021-02-06 ENCOUNTER — Other Ambulatory Visit: Payer: Self-pay

## 2021-02-06 ENCOUNTER — Encounter: Payer: Self-pay | Admitting: Podiatry

## 2021-02-06 ENCOUNTER — Ambulatory Visit (INDEPENDENT_AMBULATORY_CARE_PROVIDER_SITE_OTHER): Payer: 59 | Admitting: Podiatry

## 2021-02-06 VITALS — Temp 98.9°F

## 2021-02-06 DIAGNOSIS — L97512 Non-pressure chronic ulcer of other part of right foot with fat layer exposed: Secondary | ICD-10-CM | POA: Diagnosis not present

## 2021-02-06 DIAGNOSIS — E0843 Diabetes mellitus due to underlying condition with diabetic autonomic (poly)neuropathy: Secondary | ICD-10-CM | POA: Diagnosis not present

## 2021-02-06 NOTE — Progress Notes (Signed)
 Subjective:  50 y.o. male with PMHx of diabetes mellitus presenting to the office today for follow-up evaluation of an ulcer to the subfirst MTPJ of the right foot.  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  He wears composite toe with Dr. Scholl's insoles to help alleviate pressure from the ulcer.  He has been applying Betadine and hydrogen peroxide to the wound since last visit.  He did not get the prescription for the gentamicin cream   Past Medical History:  Diagnosis Date  . AICD (automatic cardioverter/defibrillator) present 2011  . Asthma   . Cancer (HCC)    prostate  . CHF (congestive heart failure) (HCC)   . Coronary artery disease   . Diabetes mellitus without complication (HCC)   . Dyspnea   . ED (erectile dysfunction)   . GERD (gastroesophageal reflux disease)   . Hyperlipidemia   . Hypertension   . Ischemic cardiomyopathy   . LADA (latent autoimmune diabetes in adults), managed as type 1 (HCC)   . Myocardial infarction (HCC) 2011   anterior MI at [facility] s/p BMS to ostial/proximal LAD  . Neuromuscular disorder (HCC)    problems in arms. finger tips have been numb/tingling. voltaren works  . Obstructive sleep apnea 2018   cpap. has not used lately. uses a breathe-right strip on nose  . Seizures (HCC)    diabetes seizures (low blood sugar); last one 08/2018  . Systolic heart failure (HCC)   . Vitamin D deficiency    Objective/Physical Exam General: The patient is alert and oriented x3 in no acute distress.  Dermatology:  Wound #1 noted to the subfirst MTPJ right foot measuring approximately 1.5 x 1.5 x 0.2 cm (LxWxD).   To the noted ulceration(s), there is no eschar. There is a moderate amount of slough, fibrin, and necrotic tissue noted. Granulation tissue and wound base is red. There is a minimal amount of serosanguineous drainage noted. There  is no exposed bone muscle-tendon ligament or joint. There is no malodor. Periwound integrity is intact. Skin is warm, dry and supple bilateral lower extremities.  Vascular: Palpable pedal pulses bilaterally. No edema or erythema noted. Capillary refill within normal limits.  Neurological: Epicritic and protective threshold diminished bilaterally.   Musculoskeletal Exam: Range of motion within normal limits to all pedal and ankle joints bilateral. Muscle strength 5/5 in all groups bilateral.   Assessment: 1.  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 diabetes mellitus w/ peripheral neuropathy   Plan of Care:  1. Patient was evaluated. 2. medically necessary excisional debridement including subcutaneous tissue was performed using a tissue nipper and a chisel blade. Excisional debridement of all the necrotic nonviable tissue down to healthy bleeding viable tissue was performed with post-debridement measurements same as pre-. 3. the wound was cleansed and dry sterile dressing applied. 4.  Continue Betadine ointment daily 5.  Continue offloading dancers felt pads to the insoles of the patient's work boots.  This will help alleviate pressure from the first MTPJ  6.  Patient recently had an appointment with his PCP.  He is working with his PCP to improve his blood glucose levels and manage his diabetes  * Currently working as a stand-up forklift operator   Brallan Denio M. Rourke Mcquitty, DPM [facility]  Dr. Kourtney Montesinos M. Coulton Schlink, DPM    [address]   [place], [place] [address]                Office [phone]  Fax [phone]

## 2021-02-27 ENCOUNTER — Ambulatory Visit (INDEPENDENT_AMBULATORY_CARE_PROVIDER_SITE_OTHER): Payer: 59 | Admitting: Podiatry

## 2021-02-27 ENCOUNTER — Other Ambulatory Visit: Payer: Self-pay

## 2021-02-27 DIAGNOSIS — E0843 Diabetes mellitus due to underlying condition with diabetic autonomic (poly)neuropathy: Secondary | ICD-10-CM

## 2021-02-27 DIAGNOSIS — L97512 Non-pressure chronic ulcer of other part of right foot with fat layer exposed: Secondary | ICD-10-CM | POA: Diagnosis not present

## 2021-02-27 NOTE — Progress Notes (Signed)
Subjective:  50 y.o. male with PMHx of diabetes mellitus presenting to the office today for follow-up evaluation of an ulcer to the subfirst MTPJ of the right foot.  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  He wears composite toe with Dr. Felicie Morn insoles to help alleviate pressure from the ulcer.  He has been applying Betadine and hydrogen peroxide to the wound since last visit.  He did not get the prescription for the gentamicin cream   Past Medical History:  Diagnosis Date  . AICD (automatic cardioverter/defibrillator) present 2011  . Asthma   . Cancer [facility])    prostate  . CHF (congestive heart failure) ([place])   . Coronary artery disease   . Diabetes mellitus without complication ([place])   . Dyspnea   . ED (erectile dysfunction)   . GERD (gastroesophageal reflux disease)   . Hyperlipidemia   . Hypertension   . Ischemic cardiomyopathy   . LADA (latent autoimmune diabetes in adults), managed as type 1 ([place])   . Myocardial infarction [facility]) 2011   anterior MI at [place] s/p BMS to ostial/proximal LAD  . Neuromuscular disorder ([place])    problems in arms. finger tips have been numb/tingling. voltaren works  . Obstructive sleep apnea 2018   cpap. has not used lately. uses a breathe-right strip on nose  . Seizures ([place])    diabetes seizures (low blood sugar); last one 08/2018  . Systolic heart failure ([place])   . Vitamin D deficiency    Objective/Physical Exam General: The patient is alert and oriented x3 in no acute distress.  Dermatology:  Wound #1 noted to the subfirst MTPJ right foot measuring approximately 1.5 x 1.5 x 0.2 cm (LxWxD).   To the noted ulceration(s), there is no eschar. There is a moderate amount of slough, fibrin, and necrotic tissue noted. Granulation tissue and wound base is red. There is a minimal amount of serosanguineous drainage noted. There  is no exposed bone muscle-tendon ligament or joint. There is no malodor. Periwound integrity is intact. Skin is warm, dry and supple bilateral lower extremities.  Vascular: Palpable pedal pulses bilaterally. No edema or erythema noted. Capillary refill within normal limits.  Neurological: Epicritic and protective threshold diminished bilaterally.   Musculoskeletal Exam: Range of motion within normal limits to all pedal and ankle joints bilateral. Muscle strength 5/5 in all groups bilateral.   Assessment: 1.  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 diabetes mellitus w/ peripheral neuropathy   Plan of Care:  1. Patient was evaluated. 2. medically necessary excisional debridement including subcutaneous tissue was performed using a tissue nipper and a chisel blade. Excisional debridement of all the necrotic nonviable tissue down to healthy bleeding viable tissue was performed with post-debridement measurements same as pre-. 3. the wound was cleansed and dry sterile dressing applied. 4.  Continue Betadine ointment daily 5.  Continue offloading dancers felt pads to the insoles of the patient's work boots.  This will help alleviate pressure from the first MTPJ  6.  Patient recently had an appointment with his PCP.  He is working with his PCP to improve his blood glucose levels and manage his diabetes  * Currently working as a Scientist, forensic   Edrick Kins, DPM [facility]  Dr. Edrick Kins, DPM    [address]  [place], [place] [address]                Office [phone]  Fax [phone]

## 2021-03-20 ENCOUNTER — Other Ambulatory Visit: Payer: Self-pay

## 2021-03-20 ENCOUNTER — Ambulatory Visit (INDEPENDENT_AMBULATORY_CARE_PROVIDER_SITE_OTHER): Payer: 59 | Admitting: Podiatry

## 2021-03-20 ENCOUNTER — Encounter: Payer: Self-pay | Admitting: Podiatry

## 2021-03-20 DIAGNOSIS — L97512 Non-pressure chronic ulcer of other part of right foot with fat layer exposed: Secondary | ICD-10-CM

## 2021-03-20 DIAGNOSIS — E0843 Diabetes mellitus due to underlying condition with diabetic autonomic (poly)neuropathy: Secondary | ICD-10-CM

## 2021-03-20 NOTE — Progress Notes (Signed)
Subjective:  50 y.o. male with PMHx of diabetes mellitus presenting to the office today for follow-up evaluation of an ulcer to the subfirst MTPJ of the right foot.  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  He wears composite toe with Dr. Felicie Morn insoles to help alleviate pressure from the ulcer.    He has been applying the antibiotic gentamicin cream to the wound.  He also has been wearing the offloading felt pads to the insoles of the shoes.  Past Medical History:  Diagnosis Date   AICD (automatic cardioverter/defibrillator) present 2011   Asthma    Cancer ([facility])    prostate   CHF (congestive heart failure) ([facility])    Coronary artery disease    Diabetes mellitus without complication ([facility])    Dyspnea    ED (erectile dysfunction)    GERD (gastroesophageal reflux disease)    Hyperlipidemia    Hypertension    Ischemic cardiomyopathy    LADA (latent autoimmune diabetes in adults), managed as type 1 ([place])    Myocardial infarction ([facility]) 2011   anterior MI at [place] s/p BMS to ostial/proximal LAD   Neuromuscular disorder ([facility])    problems in arms. finger tips have been numb/tingling. voltaren works   Obstructive sleep apnea 2018   cpap. has not used lately. uses a breathe-right strip on nose   Seizures ([facility])    diabetes seizures (low blood sugar); last one 55/7322   Systolic heart failure ([facility])    Vitamin D deficiency       Objective/Physical Exam General: The patient is alert and oriented x3 in no acute distress.  Dermatology:  Wound #1 noted to the subfirst MTPJ right foot measuring approximately 2.5 x 2.5 x 0.3 cm (LxWxD).   To the noted ulceration(s), there is no eschar. There is a moderate amount of slough, fibrin, and necrotic tissue noted. Granulation tissue and wound base is red. There is a minimal amount of serosanguineous drainage noted. There is no exposed bone muscle-tendon  ligament or joint. There is no malodor. Periwound integrity is intact. Skin is warm, dry and supple bilateral lower extremities.  Vascular: Palpable pedal pulses bilaterally. No edema or erythema noted. Capillary refill within normal limits.  Neurological: Epicritic and protective threshold diminished bilaterally.   Musculoskeletal Exam: Range of motion within normal limits to all pedal and ankle joints bilateral. Muscle strength 5/5 in all groups bilateral.   Assessment: 1.  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 diabetes mellitus w/ peripheral neuropathy   Plan of Care:  1. Patient was evaluated. 2. medically necessary excisional debridement including subcutaneous tissue was performed using a tissue nipper and a chisel blade. Excisional debridement of all the necrotic nonviable tissue down to healthy bleeding viable tissue was performed with post-debridement measurements same as pre-. 3. the wound was cleansed and dry sterile dressing applied. 4.  Puracol collagen wound dressing was provided for the patient to apply daily to the wound with a light dressing 5.  Continue offloading dancers felt pads to the insoles of the patient's work boots.  This will help alleviate pressure from the first MTPJ  6.  Patient recently had an appointment with his PCP.  He is working with his PCP to improve his blood glucose levels and manage his diabetes  * Currently working as a Scientist, forensic   Edrick Kins, DPM [facility]  Dr. Edrick Kins, DPM    [address], [place] [address]                Office [phone]  Fax [phone]

## 2021-04-10 ENCOUNTER — Ambulatory Visit: Payer: 59 | Admitting: Podiatry

## 2021-04-24 ENCOUNTER — Ambulatory Visit (INDEPENDENT_AMBULATORY_CARE_PROVIDER_SITE_OTHER): Payer: 59 | Admitting: Podiatry

## 2021-04-24 ENCOUNTER — Other Ambulatory Visit: Payer: Self-pay

## 2021-04-24 DIAGNOSIS — L97512 Non-pressure chronic ulcer of other part of right foot with fat layer exposed: Secondary | ICD-10-CM | POA: Diagnosis not present

## 2021-04-24 DIAGNOSIS — E0843 Diabetes mellitus due to underlying condition with diabetic autonomic (poly)neuropathy: Secondary | ICD-10-CM

## 2021-04-24 NOTE — Progress Notes (Signed)
Subjective:  50 y.o. male with PMHx of diabetes mellitus presenting to the office today for follow-up evaluation of an ulcer to the subfirst MTPJ of the right foot.  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  He wears composite toe with Dr. Felicie Morn insoles to help alleviate pressure from the ulcer.    He has been applying the antibiotic gentamicin cream to the wound.  He also has been wearing the offloading felt pads to the insoles of the shoes.  Past Medical History:  Diagnosis Date   AICD (automatic cardioverter/defibrillator) present 2011   Asthma    Cancer ([place])    prostate   CHF (congestive heart failure) ([facility] [facility])    Coronary artery disease    Diabetes mellitus without complication ([place])    Dyspnea    ED (erectile dysfunction)    GERD (gastroesophageal reflux disease)    Hyperlipidemia    Hypertension    Ischemic cardiomyopathy    LADA (latent autoimmune diabetes in adults), managed as type 1 ([place])    Myocardial infarction ([facility]) 2011   anterior MI at [place] s/p BMS to ostial/proximal LAD   Neuromuscular disorder ([place])    problems in arms. finger tips have been numb/tingling. voltaren works   Obstructive sleep apnea 2018   cpap. has not used lately. uses a breathe-right strip on nose   Seizures ([facility])    diabetes seizures (low blood sugar); last one 74/9449   Systolic heart failure ([facility])    Vitamin D deficiency       Objective/Physical Exam General: The patient is alert and oriented x3 in no acute distress.  Dermatology:  Wound #1 noted to the subfirst MTPJ right foot measuring approximately 2.5 x 2.5 x 0.3 cm (LxWxD).   To the noted ulceration(s), there is no eschar. There is a moderate amount of slough, fibrin, and necrotic tissue noted. Granulation tissue and wound base is red. There is a minimal amount of serosanguineous drainage noted. There is no exposed bone muscle-tendon  ligament or joint. There is no malodor. Periwound integrity is intact. Skin is warm, dry and supple bilateral lower extremities.  Vascular: Palpable pedal pulses bilaterally. No edema or erythema noted. Capillary refill within normal limits.  Neurological: Epicritic and protective threshold diminished bilaterally.   Musculoskeletal Exam: Range of motion within normal limits to all pedal and ankle joints bilateral. Muscle strength 5/5 in all groups bilateral.   Assessment: 1.  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 diabetes mellitus w/ peripheral neuropathy   Plan of Care:  1. Patient was evaluated. 2. medically necessary excisional debridement including subcutaneous tissue was performed using a tissue nipper and a chisel blade. Excisional debridement of all the necrotic nonviable tissue down to healthy bleeding viable tissue was performed with post-debridement measurements same as pre-. 3. the wound was cleansed and dry sterile dressing applied. 4.  Continue offloading dancers felt pads to the insoles of the patient's work boots.  This will help alleviate pressure from the first MTPJ  5.  Continue gentamicin cream daily 6.  Return to clinic 4 weeks  * Currently working as a Scientist, forensic   Edrick Kins, DPM [facility]  Dr. Edrick Kins, DPM    [address]  [place], [place] [address]                Office [phone]  Fax [phone]

## 2021-05-08 ENCOUNTER — Emergency Department
Admission: EM | Admit: 2021-05-08 | Discharge: 2021-05-08 | Disposition: A | Discharge status: Home / Self Care | Payer: 59 | Attending: Emergency Medicine | Admitting: Emergency Medicine

## 2021-05-08 ENCOUNTER — Emergency Department: Payer: 59

## 2021-05-08 ENCOUNTER — Encounter: Payer: Self-pay | Admitting: Emergency Medicine

## 2021-05-08 ENCOUNTER — Other Ambulatory Visit: Payer: Self-pay

## 2021-05-08 DIAGNOSIS — R0602 Shortness of breath: Secondary | ICD-10-CM | POA: Insufficient documentation

## 2021-05-08 DIAGNOSIS — Z8616 Personal history of COVID-19: Secondary | ICD-10-CM | POA: Insufficient documentation

## 2021-05-08 DIAGNOSIS — I5022 Chronic systolic (congestive) heart failure: Secondary | ICD-10-CM | POA: Diagnosis not present

## 2021-05-08 DIAGNOSIS — E1110 Type 2 diabetes mellitus with ketoacidosis without coma: Secondary | ICD-10-CM | POA: Insufficient documentation

## 2021-05-08 DIAGNOSIS — Z79899 Other long term (current) drug therapy: Secondary | ICD-10-CM | POA: Insufficient documentation

## 2021-05-08 DIAGNOSIS — I2510 Atherosclerotic heart disease of native coronary artery without angina pectoris: Secondary | ICD-10-CM | POA: Insufficient documentation

## 2021-05-08 DIAGNOSIS — Z7951 Long term (current) use of inhaled steroids: Secondary | ICD-10-CM | POA: Diagnosis not present

## 2021-05-08 DIAGNOSIS — Z794 Long term (current) use of insulin: Secondary | ICD-10-CM | POA: Insufficient documentation

## 2021-05-08 DIAGNOSIS — I110 Hypertensive heart disease with heart failure: Secondary | ICD-10-CM | POA: Insufficient documentation

## 2021-05-08 DIAGNOSIS — R600 Localized edema: Secondary | ICD-10-CM | POA: Insufficient documentation

## 2021-05-08 DIAGNOSIS — Z8546 Personal history of malignant neoplasm of prostate: Secondary | ICD-10-CM | POA: Insufficient documentation

## 2021-05-08 DIAGNOSIS — R609 Edema, unspecified: Secondary | ICD-10-CM

## 2021-05-08 DIAGNOSIS — J45909 Unspecified asthma, uncomplicated: Secondary | ICD-10-CM | POA: Insufficient documentation

## 2021-05-08 DIAGNOSIS — Z7982 Long term (current) use of aspirin: Secondary | ICD-10-CM | POA: Insufficient documentation

## 2021-05-08 LAB — CBC
HCT: 39.5 % (ref 39.0–52.0)
Hemoglobin: 13.5 g/dL (ref 13.0–17.0)
MCH: 30.3 pg (ref 26.0–34.0)
MCHC: 34.2 g/dL (ref 30.0–36.0)
MCV: 88.6 fL (ref 80.0–100.0)
Platelets: 186 10*3/uL (ref 150–400)
RBC: 4.46 MIL/uL (ref 4.22–5.81)
RDW: 12.9 % (ref 11.5–15.5)
WBC: 5.7 10*3/uL (ref 4.0–10.5)
nRBC: 0 % (ref 0.0–0.2)

## 2021-05-08 LAB — BASIC METABOLIC PANEL
Anion gap: 3 — ABNORMAL LOW (ref 5–15)
BUN: 13 mg/dL (ref 6–20)
CO2: 27 mmol/L (ref 22–32)
Calcium: 8.4 mg/dL — ABNORMAL LOW (ref 8.9–10.3)
Chloride: 110 mmol/L (ref 98–111)
Creatinine, Ser: 0.71 mg/dL (ref 0.61–1.24)
GFR, Estimated: >60 mL/min (ref >60)
Glucose, Bld: 144 mg/dL — ABNORMAL HIGH (ref 70–99)
Potassium: 4 mmol/L (ref 3.5–5.1)
Sodium: 140 mmol/L (ref 135–145)

## 2021-05-08 LAB — TROPONIN I (HIGH SENSITIVITY)
Troponin I (High Sensitivity): 14 ng/L (ref <18)
Troponin I (High Sensitivity): 12 ng/L (ref <18)

## 2021-05-08 LAB — D-DIMER, QUANTITATIVE: D-Dimer, Quant: 0.68 ug/mL-FEU — ABNORMAL HIGH (ref 0.00–0.50)

## 2021-05-08 LAB — BRAIN NATRIURETIC PEPTIDE: B Natriuretic Peptide: 111.8 pg/mL — ABNORMAL HIGH (ref 0.0–100.0)

## 2021-05-08 MED ORDER — FUROSEMIDE 10 MG/ML IJ SOLN
40.0000 mg | Freq: Once | INTRAMUSCULAR | Status: AC
  Administered 2021-05-08: 40 mg via INTRAVENOUS
  Filled 2021-05-08: qty 4

## 2021-05-08 MED ORDER — IOHEXOL 350 MG/ML SOLN
100.0000 mL | Freq: Once | INTRAVENOUS | Status: AC | PRN
  Administered 2021-05-08: 100 mL via INTRAVENOUS

## 2021-05-08 NOTE — ED Notes (Signed)
Pt ambulated without difficulty. Oxygen level stayed between 96-100% on room air.

## 2021-05-08 NOTE — ED Triage Notes (Signed)
Pt in w/sob x few wks. Reports lying flat makes it worse, could not sleep last night. No recent illness, does have asthma and CHF. Some BLE swelling present. Denies any cp or fevers. Sats 97% on RA. Takes Lasix and has been compliant. Reports cough w/phlegm

## 2021-05-08 NOTE — Discharge Instructions (Addendum)
Please continue all of your medications as prescribed I recommend over the next 5 days you increase your Lasix and take 40 mg in the morning and 20 mg at night.  Your work-up today showed no sign of a heart attack, fluid on your lungs, pneumonia or a blood clot.  I am concerned that your progressive worsening shortness of breath over time may be related to your cardiac function and recommend that she follow-up closely with your cardiologist.  Please call your cardiologist this morning to schedule close outpatient follow-up.

## 2021-05-08 NOTE — ED Provider Notes (Addendum)
[facility] Emergency Department Provider Note  ____________________________________________   Event Date/Time   First MD Initiated Contact with Patient 05/08/21 0413     (approximate)  I have reviewed the triage vital signs and the nursing notes.   HISTORY  Chief Complaint Shortness of Breath    HPI Joseph [name] is a 50 y.o. male with history of hypertension, diabetes, hyperlipidemia, CAD status post LAD stent, systolic heart failure status post AICD/pacemaker, asthma who presents to the emergency department several days of worsening shortness of breath.  States he feels more short of breath with exertion, lying flat.  He states he was unable to sleep tonight due to shortness of breath.  States he feels like he is swollen all over.  He states however his dry weight has not changed that he is aware of.  He appears to be on Lasix 20 mg twice daily reports compliance.  Denies any diet changes.  No fever.  Has had nonproductive cough.  No history of PE or DVT.  Not having any chest discomfort.        Past Medical History:  Diagnosis Date   AICD (automatic cardioverter/defibrillator) present 2011   Asthma    Cancer ([place])    prostate   CHF (congestive heart failure) ([place])    Coronary artery disease    Diabetes mellitus without complication ([place])    Dyspnea    ED (erectile dysfunction)    GERD (gastroesophageal reflux disease)    Hyperlipidemia    Hypertension    Ischemic cardiomyopathy    LADA (latent autoimmune diabetes in adults), managed as type 1 ([place])    Myocardial infarction ([place]) 2011   anterior MI at [place] s/p BMS to ostial/proximal LAD   Neuromuscular disorder ([place])    problems in arms. finger tips have been numb/tingling. voltaren works   Obstructive sleep apnea 2018   cpap. has not used lately. uses a breathe-right strip on nose   Seizures ([place])    diabetes seizures (low blood sugar); last one [id number]   Systolic heart failure ([place])     Vitamin D deficiency     Patient Active Problem List   Diagnosis Date Noted   Pneumonia due to COVID-19 virus 09/19/2020   Diabetes mellitus without complication ([place])    Hyperglycemia due to type 1 diabetes mellitus ([place]) 07/17/2020   Diabetic foot ulcer associated with type 1 diabetes mellitus ([place]) 07/17/2020   Cellulitis 07/14/2020   Sepsis ([place]) 07/14/2020   Cystitis    Scrotal pain    CAD (coronary artery disease) 01/12/2020   S/P prostatectomy 01/12/2020   Severe asthma without complication [id number]   UTI (urinary tract infection) 01/12/2020   Possible Postprocedural intraabdominal abscess 01/12/2020   Prostate cancer ([place]) 01/03/2020   Preop cardiovascular exam 10/18/2019   Abdominal bloating 04/12/2019   Chronic diarrhea 04/12/2019   Gastritis and duodenitis 04/12/2019   Dyskinesia of gallbladder 11/07/2017   Elevated PSA 07/25/2017   Erectile dysfunction associated with type 2 diabetes mellitus ([place]) 07/25/2017   Hypertension 03/01/2017   Obstructive sleep apnea on CPAP [id number]   Chronic systolic CHF (congestive heart failure) ([place]) 11/24/2016   Shortness of breath    Abnormal EKG    DKA (diabetic ketoacidoses) 04/18/2016   CAP (community acquired pneumonia) 04/18/2016   Accelerated hypertension 04/18/2016   Abdominal pain 04/18/2016   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[place]) 08/19/2015   Poorly controlled type 1 diabetes mellitus ([place]) 08/07/2015   Required emergent intubation 03/07/2014   LADA (  latent autoimmune diabetes in adults), managed as type 1 ([place]) 09/15/2011   GERD without esophagitis 08/18/2011   Hyperlipemia 08/18/2011   Implantable cardioverter-defibrillator (ICD) in situ 08/18/2011   Ischemic cardiomyopathy 08/18/2011   MI (myocardial infarction) ([place]) 03/11/2010    Past Surgical History:  Procedure Laterality Date   CARDIAC DEFIBRILLATOR PLACEMENT  08/30/2010   CHOLECYSTECTOMY N/A 11/07/2017   Procedure: LAPAROSCOPIC CHOLECYSTECTOMY;  Surgeon:  Herbert Pun, MD;  Location: [facility] ORS;  Service: General;  Laterality: N/A;   CORONARY ANGIOPLASTY  2011   stent placed x 1   CYSTOSCOPY N/A 01/03/2020   Procedure: CYSTOSCOPY FLEXIBLE;  Surgeon: Hollice Espy, MD;  Location: [facility] ORS;  Service: Urology;  Laterality: N/A;   ESOPHAGOGASTRODUODENOSCOPY (EGD) WITH PROPOFOL N/A 03/20/2018   Procedure: ESOPHAGOGASTRODUODENOSCOPY (EGD) WITH PROPOFOL;  Surgeon: Manya Silvas, MD;  Location: [facility] END[facility]COPY;  Service: Endoscopy;  Laterality: N/A;   LEFT HEART CATH AND CORONARY ANGIOGRAPHY N/A 11/23/2016   Procedure: Left Heart Cath and Coronary Angiography;  Surgeon: Nelva Bush, MD;  Location: [facility] CV LAB;  Service: Cardiovascular;  Laterality: N/A;   PROSTATE BIOPSY  2019   PROSTATE BIOPSY N/A 10/23/2019   Procedure: PROSTATE BIOPSY;  Surgeon: Abbie Sons, MD;  Location: [facility] ORS;  Service: Urology;  Laterality: N/A;   ROBOT ASSISTED LAPAROSCOPIC RADICAL PROSTATECTOMY N/A 01/03/2020   Procedure: XI ROBOTIC ASSISTED LAPAROSCOPIC RADICAL PROSTATECTOMY;  Surgeon: Hollice Espy, MD;  Location: [facility] ORS;  Service: Urology;  Laterality: N/A;   TRANSRECTAL ULTRASOUND N/A 10/23/2019   Procedure: TRANSRECTAL ULTRASOUND;  Surgeon: Abbie Sons, MD;  Location: [facility] ORS;  Service: Urology;  Laterality: N/A;   WRIST SURGERY Right 2008    Prior to Admission medications   Medication Sig Start Date End Date Taking? Authorizing Provider  acetaminophen (TYLENOL) 500 MG tablet Take 500 mg by mouth every 6 (six) hours as needed for fever.    [provider]  albuterol (PROVENTIL HFA) 108 (90 Base) MCG/ACT inhaler Inhale 2 puffs into the lungs every 4 (four) hours as needed for wheezing or shortness of breath. 10/08/18   Carrie Mew, MD  aspirin EC 81 MG tablet Take 81 mg by mouth every morning.    [provider]  atorvastatin (LIPITOR) 40 MG tablet Take 40 mg by mouth daily. 06/08/20   [provider]   carvedilol (COREG) 12.5 MG tablet Take 12.5 mg by mouth daily. 05/28/20   [provider]  Cholecalciferol (D3-1000) 1000 units capsule Take 1,000 Units by mouth daily.    [provider]  digoxin (LANOXIN) 0.125 MG tablet Take 1 tablet (125 mcg total) by mouth daily. 01/15/20   Lorella Nimrod, MD  fluticasone (FLONASE) 50 MCG/ACT nasal spray Place 2 sprays into both nostrils daily as needed for allergies.  01/26/16   [provider]  furosemide (LASIX) 20 MG tablet Take 1 tablet (20 mg total) by mouth 2 (two) times daily. 12/01/19   Carrie Mew, MD  gentamicin cream (GARAMYCIN) 0.1 % Apply 1 application topically 2 (two) times daily. 12/26/20   Edrick Kins, DPM  glucagon 1 MG injection Inject into the muscle. 02/07/18   [provider]  glucagon, human recombinant, (GLUCAGEN DIAGNOSTIC) 1 MG injection Inject into the muscle. 02/07/18   [provider]  insulin aspart (NOVOLOG) 100 UNIT/ML FlexPen Inject 18-20-22 u TID AC plus sliding scale for up to 75 units TDD Patient not taking: Reported on 07/15/2020 01/10/20   [provider]  insulin degludec (TRESIBA FLEXTOUCH)  200 UNIT/ML FlexTouch Pen Inject 50 Units into the skin daily.  01/29/20   [provider]  Insulin Pen Needle (B-D ULTRAFINE III SHORT PEN) 31G X 8 MM MISC USE 5 TIMES DAILY AS  DIRECTED 12/01/19   Carrie Mew, MD  loratadine (CLARITIN) 10 MG tablet Take 10 mg by mouth daily as needed.     [provider]  losartan (COZAAR) 100 MG tablet Take 1 tablet (100 mg total) by mouth at bedtime. 12/01/19   Carrie Mew, MD  naproxen sodium (ALEVE) 220 MG tablet Take 220 mg by mouth daily as needed.    [provider]  potassium chloride (KLOR-CON) 10 MEQ tablet Take by mouth. 04/01/20   [provider]  spironolactone (ALDACTONE) 25 MG tablet Take 0.5 tablets (12.5 mg total) by mouth daily. 01/15/20   Lorella Nimrod, MD  SYMBICORT 160-4.5 MCG/ACT  inhaler Inhale 2 puffs into the lungs 2 (two) times daily. 05/27/20   [provider]    Allergies Vancomycin and Lisinopril  Family History  Problem Relation Age of Onset   Hypertension Mother    Heart attack Paternal Grandmother    Heart attack Father    Bladder Cancer Neg Hx    Kidney cancer Neg Hx    Prostate cancer Neg Hx     Social History Social History   Tobacco Use   Smoking status: Never   Smokeless tobacco: Never  Vaping Use   Vaping Use: Never used  Substance Use Topics   Alcohol use: No   Drug use: No    Review of Systems Constitutional: No fever. Eyes: No visual changes. ENT: No sore throat. Cardiovascular: Denies chest pain. Respiratory: + shortness of breath. Gastrointestinal: No nausea, vomiting, diarrhea. Genitourinary: Negative for dysuria. Musculoskeletal: Negative for back pain. Skin: Negative for rash. Neurological: Negative for focal weakness or numbness.  ____________________________________________   PHYSICAL EXAM:  VITAL SIGNS: ED Triage Vitals  Enc Vitals Group     BP 05/08/21 0342 (!) 152/94     Pulse Rate 05/08/21 0342 77     Resp 05/08/21 0342 18     Temp 05/08/21 0342 97.8 F (36.6 C)     Temp src --      SpO2 05/08/21 0342 98 %     Weight 05/08/21 0343 219 lb (99.3 kg)     Height --      Head Circumference --      Peak Flow --      Pain Score --      Pain Loc --      Pain Edu? --      Excl. in [place]? --    CONSTITUTIONAL: Alert and oriented and responds appropriately to questions. Well-appearing; well-nourished HEAD: Normocephalic EYES: Conjunctivae clear, pupils appear equal, EOM appear intact ENT: normal nose; moist mucous membranes NECK: Supple, normal ROM CARD: RRR; S1 and S2 appreciated; no murmurs, no clicks, no rubs, no gallops RESP: Normal chest excursion without splinting or tachypnea; breath sounds clear and equal bilaterally; no wheezes, no rhonchi, no rales, no hypoxia or respiratory distress,  appears slightly dyspneic ABD/GI: Normal bowel sounds; non-distended; soft, non-tender, no rebound, no guarding, no peritoneal signs, no hepatosplenomegaly BACK: The back appears normal EXT: Normal ROM in all joints; no deformity noted, +1 pitting edema in bilateral lower extremities and into the lower abdomen SKIN: Normal color for age and race; warm; no rash on exposed skin NEURO: Moves all extremities equally PSYCH: The patient's mood and manner are appropriate.  ____________________________________________   LABS (all labs ordered are listed, but only abnormal results are displayed)  Labs Reviewed  BASIC METABOLIC PANEL - Abnormal; Notable for the following components:      Result Value   Glucose, Bld 144 (*)    Calcium 8.4 (*)    Anion gap 3 (*)    All other components within normal limits  BRAIN NATRIURETIC PEPTIDE - Abnormal; Notable for the following components:   B Natriuretic Peptide 111.8 (*)    All other components within normal limits  D-DIMER, QUANTITATIVE - Abnormal; Notable for the following components:   D-Dimer, Quant 0.68 (*)    All other components within normal limits  CBC  TROPONIN I (HIGH SENSITIVITY)  TROPONIN I (HIGH SENSITIVITY)   ____________________________________________  EKG   EKG Interpretation  Date/Time:  Friday May 08 2021 03:50:18 EDT Ventricular Rate:  75 PR Interval:  144 QRS Duration: 132 QT Interval:  404 QTC Calculation: 451 R Axis:   -86 Text Interpretation: Normal sinus rhythm Possible Left atrial enlargement Left axis deviation Right bundle branch block Possible Lateral infarct , age undetermined Abnormal ECG No significant change since last tracing Confirmed by Pryor Curia [phone]) on 05/08/2021 3:53:02 AM        ____________________________________________  RADIOLOGY Jessie Foot Thaison Kolodziejski, personally viewed and evaluated these images (plain radiographs) as part of my medical decision making, as well as reviewing the written  report by the radiologist.  ED MD interpretation: Chest x-ray shows mild/developing edema.  Official radiology report(s): DG Chest 2 View  Result Date: 05/08/2021 CLINICAL DATA:  51 year old male with shortness of breath, unable to lie flat. Some lower extremity swelling. EXAM: CHEST - 2 VIEW COMPARISON:  Chest radiographs 09/19/2020 and earlier. FINDINGS: Stable left chest AICD. Lung volumes and mediastinal contours are stable and within normal limits. Visualized tracheal air column is within normal limits. No pneumothorax. No pleural effusion or confluent pulmonary opacity. Pulmonary vascularity appears mildly increased since 2021. But no alveolar edema identified at this time. No acute osseous abnormality identified. Negative visible bowel gas pattern. IMPRESSION: Mildly increased pulmonary vascularity since last year might indicate mild or developing interstitial edema. No other acute cardiopulmonary abnormality. Electronically Signed   By: Genevie Ann M.D.   On: 05/08/2021 04:52   CT Angio Chest PE W and/or Wo Contrast  Result Date: 05/08/2021 CLINICAL DATA:  Shortness of breath, positive D-dimer, low/intermediate prob PE EXAM: CT ANGIOGRAPHY CHEST WITH CONTRAST TECHNIQUE: Multidetector CT imaging of the chest was performed using the standard protocol during bolus administration of intravenous contrast. Multiplanar CT image reconstructions and MIPs were obtained to evaluate the vascular anatomy. CONTRAST:  139m OMNIPAQUE IOHEXOL 350 MG/ML SOLN COMPARISON:  CT abdomen 02/20/2020 FINDINGS: Cardiovascular: Heart size normal. No pericardial effusion. Left subclavian AICD to the RV apex. The RV is nondilated. Satisfactory opacification of pulmonary arteries noted, and there is no evidence of pulmonary emboli. Coronary stent. Fair contrast opacification of the thoracic aorta with no suggestion of dissection, aneurysm, or stenosis. There is bovine variant brachiocephalic arch anatomy without proximal stenosis.  Mediastinum/Nodes: No mass or adenopathy. Lungs/Pleura: No pleural effusion.  Lungs are clear. Upper Abdomen: No acute findings.  Cholecystectomy clips. Musculoskeletal: No chest wall abnormality. No acute or significant osseous findings. Review of the MIP images confirms the above findings. IMPRESSION: No evidence of acute PE or thoracic aortic dissection. Electronically Signed   By: DLucrezia EuropeM.D.   On: 05/08/2021 07:10    ____________________________________________   PROCEDURES  Procedure(s)  performed (including Critical Care):  Procedures   ____________________________________________   INITIAL IMPRESSION / ASSESSMENT AND PLAN / ED COURSE  As part of my medical decision making, I reviewed the following data within the [place] notes reviewed and incorporated, Labs reviewed , EKG interpreted , Old EKG reviewed, Radiograph reviewed , and Notes from prior ED visits         Chest x-ray with increasing shortness of breath.  Differential includes CHF exacerbation, ACS, PE, dissection, pneumonia, pneumothorax, anemia.  Will obtain labs, chest x-ray.  EKG shows no new ischemic change.  We will interrogate his pacemaker.  Lungs clear to auscultation here.  This does not appear to be an asthma exacerbation.  ED PROGRESS  Labs show that patient has an elevated D-dimer.  Will obtain CTA of the chest.  Chest x-ray does show some possible mild pulmonary edema but otherwise is not significantly remarkable.  BNP mildly elevated at 111.  First troponin negative.  EKG shows no new ischemic change compared to previous.  Received interrogation report from [employer].  It looks like the last episodes of nonsustained ventricular arrhythmia were July 2 and 3.  Patient has normal battery life.  7:10 AM  Pt's CT scan shows no PE, infiltrate or edema.  He has been able to ambulate here without hypoxia or increased work of breathing.  I have talked to him further and he  actually states now that he has had worsening shortness of breath with exertion ongoing for months.  I recommended that he follow-up with his cardiologist.  He has not seen them since January 2021.  States he just got a new job and is awaiting his insurance card but will call in the morning to schedule an appointment.  Troponin x2 negative.  EKG shows no new ischemic change.  He reports he is diuresing after IV Lasix and is feeling better.  Given he does have peripheral edema on exam I have recommended that he increase his Lasix from 20 mg twice daily to 40 mg in the morning and 20 mg at night for the next 5 days.  Discussed return precautions.  He is comfortable with this plan.   At this time, I do not feel there is any life-threatening condition present. I have reviewed, interpreted and discussed all results (EKG, imaging, lab, urine as appropriate) and exam findings with patient/family. I have reviewed nursing notes and appropriate previous records.  I feel the patient is safe to be discharged home without further emergent workup and can continue workup as an outpatient as needed. Discussed usual and customary return precautions. Patient/family verbalize understanding and are comfortable with this plan.  Outpatient follow-up has been provided as needed. All questions have been answered.  ____________________________________________   FINAL CLINICAL IMPRESSION(S) / ED DIAGNOSES  Final diagnoses:  SOB (shortness of breath)     ED Discharge Orders     None       *Please note:  Joseph [name] was evaluated in Emergency Department on 05/08/2021 for the symptoms described in the history of present illness. He was evaluated in the context of the global COVID-19 pandemic, which necessitated consideration that the patient might be at risk for infection with the SARS-CoV-2 virus that causes COVID-19. Institutional protocols and algorithms that pertain to the evaluation of patients at risk for COVID-19  are in a state of rapid change based on information released by regulatory bodies including the CDC and federal and state organizations. These policies  and algorithms were followed during the patient's care in the ED.  Some ED evaluations and interventions may be delayed as a result of limited staffing during and the pandemic.*   Note:  This document was prepared using Dragon voice recognition software and may include unintentional dictation errors.    Kateena Degroote, Delice Bison, DO 05/08/21 Chance, Delice Bison, DO 05/08/21 [phone]

## 2021-05-09 ENCOUNTER — Emergency Department
Admission: EM | Admit: 2021-05-09 | Discharge: 2021-05-09 | Disposition: A | Discharge status: Home / Self Care | Payer: 59 | Attending: Emergency Medicine | Admitting: Emergency Medicine

## 2021-05-09 ENCOUNTER — Emergency Department: Payer: 59

## 2021-05-09 ENCOUNTER — Encounter: Payer: Self-pay | Admitting: Emergency Medicine

## 2021-05-09 ENCOUNTER — Other Ambulatory Visit: Payer: Self-pay

## 2021-05-09 DIAGNOSIS — Z950 Presence of cardiac pacemaker: Secondary | ICD-10-CM | POA: Diagnosis not present

## 2021-05-09 DIAGNOSIS — J019 Acute sinusitis, unspecified: Secondary | ICD-10-CM | POA: Insufficient documentation

## 2021-05-09 DIAGNOSIS — E10621 Type 1 diabetes mellitus with foot ulcer: Secondary | ICD-10-CM | POA: Diagnosis not present

## 2021-05-09 DIAGNOSIS — E1010 Type 1 diabetes mellitus with ketoacidosis without coma: Secondary | ICD-10-CM | POA: Insufficient documentation

## 2021-05-09 DIAGNOSIS — I2510 Atherosclerotic heart disease of native coronary artery without angina pectoris: Secondary | ICD-10-CM | POA: Insufficient documentation

## 2021-05-09 DIAGNOSIS — Z20822 Contact with and (suspected) exposure to covid-19: Secondary | ICD-10-CM | POA: Diagnosis not present

## 2021-05-09 DIAGNOSIS — L97409 Non-pressure chronic ulcer of unspecified heel and midfoot with unspecified severity: Secondary | ICD-10-CM | POA: Insufficient documentation

## 2021-05-09 DIAGNOSIS — R519 Headache, unspecified: Secondary | ICD-10-CM | POA: Insufficient documentation

## 2021-05-09 DIAGNOSIS — Z79899 Other long term (current) drug therapy: Secondary | ICD-10-CM | POA: Insufficient documentation

## 2021-05-09 DIAGNOSIS — Z8546 Personal history of malignant neoplasm of prostate: Secondary | ICD-10-CM | POA: Insufficient documentation

## 2021-05-09 DIAGNOSIS — Z8616 Personal history of COVID-19: Secondary | ICD-10-CM | POA: Insufficient documentation

## 2021-05-09 DIAGNOSIS — J45909 Unspecified asthma, uncomplicated: Secondary | ICD-10-CM | POA: Diagnosis not present

## 2021-05-09 DIAGNOSIS — Z7982 Long term (current) use of aspirin: Secondary | ICD-10-CM | POA: Diagnosis not present

## 2021-05-09 DIAGNOSIS — I509 Heart failure, unspecified: Secondary | ICD-10-CM

## 2021-05-09 DIAGNOSIS — I110 Hypertensive heart disease with heart failure: Secondary | ICD-10-CM | POA: Diagnosis not present

## 2021-05-09 DIAGNOSIS — R0602 Shortness of breath: Secondary | ICD-10-CM | POA: Diagnosis present

## 2021-05-09 DIAGNOSIS — I5022 Chronic systolic (congestive) heart failure: Secondary | ICD-10-CM | POA: Insufficient documentation

## 2021-05-09 DIAGNOSIS — Z7951 Long term (current) use of inhaled steroids: Secondary | ICD-10-CM | POA: Diagnosis not present

## 2021-05-09 LAB — COMPREHENSIVE METABOLIC PANEL
ALT: 32 U/L (ref 0–44)
AST: 27 U/L (ref 15–41)
Albumin: 3.4 g/dL — ABNORMAL LOW (ref 3.5–5.0)
Alkaline Phosphatase: 111 U/L (ref 38–126)
Anion gap: 9 (ref 5–15)
BUN: 15 mg/dL (ref 6–20)
CO2: 26 mmol/L (ref 22–32)
Calcium: 9.2 mg/dL (ref 8.9–10.3)
Chloride: 102 mmol/L (ref 98–111)
Creatinine, Ser: 0.86 mg/dL (ref 0.61–1.24)
GFR, Estimated: >60 mL/min (ref >60)
Glucose, Bld: 164 mg/dL — ABNORMAL HIGH (ref 70–99)
Potassium: 3.9 mmol/L (ref 3.5–5.1)
Sodium: 137 mmol/L (ref 135–145)
Total Bilirubin: 0.8 mg/dL (ref 0.3–1.2)
Total Protein: 7 g/dL (ref 6.5–8.1)

## 2021-05-09 LAB — CBC
HCT: 38.6 % — ABNORMAL LOW (ref 39.0–52.0)
Hemoglobin: 13.2 g/dL (ref 13.0–17.0)
MCH: 30.5 pg (ref 26.0–34.0)
MCHC: 34.2 g/dL (ref 30.0–36.0)
MCV: 89.1 fL (ref 80.0–100.0)
Platelets: 186 10*3/uL (ref 150–400)
RBC: 4.33 MIL/uL (ref 4.22–5.81)
RDW: 12.7 % (ref 11.5–15.5)
WBC: 5.8 10*3/uL (ref 4.0–10.5)
nRBC: 0 % (ref 0.0–0.2)

## 2021-05-09 LAB — RESP PANEL BY RT-PCR (FLU A&B, COVID) ARPGX2
Influenza A by PCR: NEGATIVE
Influenza B by PCR: NEGATIVE
SARS Coronavirus 2 by RT PCR: NEGATIVE

## 2021-05-09 LAB — TROPONIN I (HIGH SENSITIVITY)
Troponin I (High Sensitivity): 12 ng/L (ref <18)
Troponin I (High Sensitivity): 7 ng/L (ref <18)

## 2021-05-09 LAB — BRAIN NATRIURETIC PEPTIDE: B Natriuretic Peptide: 105.7 pg/mL — ABNORMAL HIGH (ref 0.0–100.0)

## 2021-05-09 MED ORDER — FUROSEMIDE 10 MG/ML IJ SOLN
40.0000 mg | Freq: Once | INTRAMUSCULAR | Status: AC
  Administered 2021-05-09: 40 mg via INTRAVENOUS
  Filled 2021-05-09: qty 4

## 2021-05-09 MED ORDER — FUROSEMIDE 20 MG PO TABS: 20.0000 mg | ORAL_TABLET | Freq: Two times a day (BID) | ORAL | 0 refills | Status: DC

## 2021-05-09 MED ORDER — AMOXICILLIN-POT CLAVULANATE 875-125 MG PO TABS: 1.0000 | ORAL_TABLET | Freq: Two times a day (BID) | ORAL | 0 refills | Status: AC

## 2021-05-09 MED ORDER — METOCLOPRAMIDE HCL 5 MG/ML IJ SOLN
10.0000 mg | Freq: Once | INTRAMUSCULAR | Status: AC
  Administered 2021-05-09: 10 mg via INTRAVENOUS
  Filled 2021-05-09: qty 2

## 2021-05-09 MED ORDER — DIPHENHYDRAMINE HCL 50 MG/ML IJ SOLN
12.5000 mg | Freq: Once | INTRAMUSCULAR | Status: AC
  Administered 2021-05-09: 12.5 mg via INTRAVENOUS
  Filled 2021-05-09: qty 1

## 2021-05-09 MED ORDER — ACETAMINOPHEN 500 MG PO TABS
1000.0000 mg | ORAL_TABLET | Freq: Once | ORAL | Status: AC
  Administered 2021-05-09: 1000 mg via ORAL
  Filled 2021-05-09: qty 2

## 2021-05-09 NOTE — ED Notes (Signed)
Ambulatory SaO2 >93% with HR 79-85. MD notified.

## 2021-05-09 NOTE — ED Notes (Signed)
Pt transported to CT via stretcher at this time.  

## 2021-05-09 NOTE — ED Notes (Signed)
Pt provided with urinal at this time

## 2021-05-09 NOTE — ED Triage Notes (Signed)
Pt reports SOB and HA for awhile now. Pt states was seen here yesterday and d/c but went back to work but still SOB and severe HA. Pt reports cardiac history.

## 2021-05-09 NOTE — Discharge Instructions (Addendum)
Take the Lasix 40 mg starting tomorrow in the morning and 20 mg around 2 or 3 PM.  Do this for the next 3 days.  Follow-up with the heart failure clinic.  They will call you to schedule an appointment.  Take the antibiotics to help with sinusitis seen on CT imaging.  Return the ER for worsening shortness of breath fever or any other concern

## 2021-05-09 NOTE — ED Notes (Addendum)
Pt transported to Xray via stretcher at this time.  ?

## 2021-05-09 NOTE — ED Provider Notes (Addendum)
[facility] Emergency Department Provider Note  ____________________________________________   Event Date/Time   First MD Initiated Contact with Patient 05/09/21 [phone]     (approximate)  I have reviewed the triage vital signs and the nursing notes.   HISTORY  Chief Complaint Shortness of Breath and Headache    HPI Joseph [name] is a 50 y.o. male with hypertension, diabetes, hyperlipidemia, coronary disease status post LAD stent, systolic heart failure status post AICD pacemaker, asthma who comes in for shortness of breath.  Patient reports shortness of breath for a long time but states it is gotten worse over the past 1 to 2 weeks.  He reports some chronic cough and congestion but nothing has been changing.  He reports being seen yesterday and being told he needs to go up on his Lasix but he states that he does not have enough pills to do that.  He states that he tried to get a cardiology appointment but he is unable to get one until August 9.  He states that he was at work today and he has a gradually worsening headache and just did not feel well with the shortness of breath so he decided to come in to be evaluated again.  The headache is currently moderate, constant, nothing makes it better or worse.  Patient reports is gradually worsening.  Denies being sudden or severe in onset.  Denies any chest pain associate with these episodes.  States that shortness of breath can occur at rest and with exertion.  States that the shortness of breath is associated with some leg edema.   On review of records patient was seen yesterday for similar.  Patient is on Lasix 20 mg twice daily.  Yesterday patient had a CT scan done that showed no evidence of PE.  Patient had his pacemaker reviewed that showed the last episode of nonsustained ventricular arrhythmia was in July 2 and 3.  Given that this is been going on for so long patient was recommended to follow-up with cardiology since  he has not seen them since January 2021.  Patient was given dose of IV Lasix and reported feeling better.  They increase his Lasix up to 40 mg in the morning and 20 mg at night.          Past Medical History:  Diagnosis Date   AICD (automatic cardioverter/defibrillator) present 2011   Asthma    Cancer ([place])    prostate   CHF (congestive heart failure) ([place])    Coronary artery disease    Diabetes mellitus without complication ([place])    Dyspnea    ED (erectile dysfunction)    GERD (gastroesophageal reflux disease)    Hyperlipidemia    Hypertension    Ischemic cardiomyopathy    LADA (latent autoimmune diabetes in adults), managed as type 1 ([place])    Myocardial infarction ([place]) 2011   anterior MI at [place] s/p BMS to ostial/proximal LAD   Neuromuscular disorder ([place])    problems in arms. finger tips have been numb/tingling. voltaren works   Obstructive sleep apnea 2018   cpap. has not used lately. uses a breathe-right strip on nose   Seizures ([place])    diabetes seizures (low blood sugar); last one [id number]   Systolic heart failure ([place])    Vitamin D deficiency     Patient Active Problem List   Diagnosis Date Noted   Pneumonia due to COVID-19 virus 09/19/2020   Diabetes mellitus without complication ([place])  Hyperglycemia due to type 1 diabetes mellitus ([place]) 07/17/2020   Diabetic foot ulcer associated with type 1 diabetes mellitus ([place]) 07/17/2020   Cellulitis 07/14/2020   Sepsis ([place]) 07/14/2020   Cystitis    Scrotal pain    CAD (coronary artery disease) 01/12/2020   S/P prostatectomy 01/12/2020   Severe asthma without complication [id number]   UTI (urinary tract infection) 01/12/2020   Possible Postprocedural intraabdominal abscess 01/12/2020   Prostate cancer ([place]) 01/03/2020   Preop cardiovascular exam 10/18/2019   Abdominal bloating 04/12/2019   Chronic diarrhea 04/12/2019   Gastritis and duodenitis 04/12/2019   Dyskinesia of gallbladder 11/07/2017   Elevated PSA  07/25/2017   Erectile dysfunction associated with type 2 diabetes mellitus ([place]) 07/25/2017   Hypertension 03/01/2017   Obstructive sleep apnea on CPAP [id number]   Chronic systolic CHF (congestive heart failure) ([place]) 11/24/2016   Shortness of breath    Abnormal EKG    DKA (diabetic ketoacidoses) 04/18/2016   CAP (community acquired pneumonia) 04/18/2016   Accelerated hypertension 04/18/2016   Abdominal pain 04/18/2016   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[place]) 08/19/2015   Poorly controlled type 1 diabetes mellitus ([place]) 08/07/2015   Required emergent intubation 03/07/2014   LADA (latent autoimmune diabetes in adults), managed as type 1 ([place]) 09/15/2011   GERD without esophagitis 08/18/2011   Hyperlipemia 08/18/2011   Implantable cardioverter-defibrillator (ICD) in situ 08/18/2011   Ischemic cardiomyopathy 08/18/2011   MI (myocardial infarction) ([place]) 03/11/2010    Past Surgical History:  Procedure Laterality Date   CARDIAC DEFIBRILLATOR PLACEMENT  08/30/2010   CHOLECYSTECTOMY N/A 11/07/2017   Procedure: LAPAROSCOPIC CHOLECYSTECTOMY;  Surgeon: Herbert Pun, MD;  Location: [facility] ORS;  Service: General;  Laterality: N/A;   CORONARY ANGIOPLASTY  2011   stent placed x 1   CYSTOSCOPY N/A 01/03/2020   Procedure: CYSTOSCOPY FLEXIBLE;  Surgeon: Hollice Espy, MD;  Location: [facility] ORS;  Service: Urology;  Laterality: N/A;   ESOPHAGOGASTRODUODENOSCOPY (EGD) WITH PROPOFOL N/A 03/20/2018   Procedure: ESOPHAGOGASTRODUODENOSCOPY (EGD) WITH PROPOFOL;  Surgeon: Manya Silvas, MD;  Location: [facility] ENDOSCOPY;  Service: Endoscopy;  Laterality: N/A;   LEFT HEART CATH AND CORONARY ANGIOGRAPHY N/A 11/23/2016   Procedure: Left Heart Cath and Coronary Angiography;  Surgeon: Nelva Bush, MD;  Location: [facility] LAB;  Service: Cardiovascular;  Laterality: N/A;   PROSTATE BIOPSY  2019   PROSTATE BIOPSY N/A 10/23/2019   Procedure: PROSTATE BIOPSY;  Surgeon: Abbie Sons, MD;  Location: [facility] ORS;   Service: Urology;  Laterality: N/A;   ROBOT ASSISTED LAPAROSCOPIC RADICAL PROSTATECTOMY N/A 01/03/2020   Procedure: XI ROBOTIC ASSISTED LAPAROSCOPIC RADICAL PROSTATECTOMY;  Surgeon: Hollice Espy, MD;  Location: [facility] ORS;  Service: Urology;  Laterality: N/A;   TRANSRECTAL ULTRASOUND N/A 10/23/2019   Procedure: TRANSRECTAL ULTRASOUND;  Surgeon: Abbie Sons, MD;  Location: [facility] ORS;  Service: Urology;  Laterality: N/A;   WRIST SURGERY Right 2008    Prior to Admission medications   Medication Sig Start Date End Date Taking? Authorizing Provider  acetaminophen (TYLENOL) 500 MG tablet Take 500 mg by mouth every 6 (six) hours as needed for fever.    [provider]  albuterol (PROVENTIL HFA) 108 (90 Base) MCG/ACT inhaler Inhale 2 puffs into the lungs every 4 (four) hours as needed for wheezing or shortness of breath. 10/08/18   Carrie Mew, MD  aspirin EC 81 MG tablet Take 81 mg by mouth every morning.    [provider]  atorvastatin (LIPITOR) 40 MG tablet Take 40 mg by mouth daily.  06/08/20   [provider]  carvedilol (COREG) 12.5 MG tablet Take 12.5 mg by mouth daily. 05/28/20   [provider]  Cholecalciferol (D3-1000) 1000 units capsule Take 1,000 Units by mouth daily.    [provider]  digoxin (LANOXIN) 0.125 MG tablet Take 1 tablet (125 mcg total) by mouth daily. 01/15/20   Lorella Nimrod, MD  fluticasone (FLONASE) 50 MCG/ACT nasal spray Place 2 sprays into both nostrils daily as needed for allergies.  01/26/16   [provider]  furosemide (LASIX) 20 MG tablet Take 1 tablet (20 mg total) by mouth 2 (two) times daily. 12/01/19   Carrie Mew, MD  gentamicin cream (GARAMYCIN) 0.1 % Apply 1 application topically 2 (two) times daily. 12/26/20   Edrick Kins, DPM  glucagon 1 MG injection Inject into the muscle. 02/07/18   [provider]  glucagon, human recombinant, (GLUCAGEN DIAGNOSTIC) 1 MG injection Inject into the  muscle. 02/07/18   [provider]  insulin aspart (NOVOLOG) 100 UNIT/ML FlexPen Inject 18-20-22 u TID AC plus sliding scale for up to 75 units TDD Patient not taking: Reported on 07/15/2020 01/10/20   [provider]  insulin degludec (TRESIBA FLEXTOUCH) 200 UNIT/ML FlexTouch Pen Inject 50 Units into the skin daily.  01/29/20   [provider]  Insulin Pen Needle (B-D ULTRAFINE III SHORT PEN) 31G X 8 MM MISC USE 5 TIMES DAILY AS  DIRECTED 12/01/19   Carrie Mew, MD  loratadine (CLARITIN) 10 MG tablet Take 10 mg by mouth daily as needed.     [provider]  losartan (COZAAR) 100 MG tablet Take 1 tablet (100 mg total) by mouth at bedtime. 12/01/19   Carrie Mew, MD  naproxen sodium (ALEVE) 220 MG tablet Take 220 mg by mouth daily as needed.    [provider]  potassium chloride (KLOR-CON) 10 MEQ tablet Take by mouth. 04/01/20   [provider]  spironolactone (ALDACTONE) 25 MG tablet Take 0.5 tablets (12.5 mg total) by mouth daily. 01/15/20   Lorella Nimrod, MD  SYMBICORT 160-4.5 MCG/ACT inhaler Inhale 2 puffs into the lungs 2 (two) times daily. 05/27/20   [provider]    Allergies Vancomycin and Lisinopril  Family History  Problem Relation Age of Onset   Hypertension Mother    Heart attack Paternal Grandmother    Heart attack Father    Bladder Cancer Neg Hx    Kidney cancer Neg Hx    Prostate cancer Neg Hx     Social History Social History   Tobacco Use   Smoking status: Never   Smokeless tobacco: Never  Vaping Use   Vaping Use: Never used  Substance Use Topics   Alcohol use: No   Drug use: No      Review of Systems Constitutional: No fever/chills Eyes: No visual changes. ENT: No sore throat. Cardiovascular: No chest pain Respiratory: Positive for SOB, cough, congestion Gastrointestinal: No abdominal pain.  No nausea, no vomiting.  No diarrhea.  No constipation. Genitourinary: Negative for  dysuria. Musculoskeletal: Negative for back pain. Skin: Negative for rash. Neurological: Positive headache, no focal weakness or numbness. All other ROS negative ____________________________________________   PHYSICAL EXAM:  VITAL SIGNS: ED Triage Vitals  Enc Vitals Group     BP 05/09/21 0948 121/71     Pulse Rate 05/09/21 0948 90     Resp 05/09/21 0948 20     Temp 05/09/21 0948 98.4 F (36.9 C)     Temp Source 05/09/21  [id number] Oral     SpO2 05/09/21 0948 98 %     Weight 05/09/21 0934 219 lb (99.3 kg)     Height 05/09/21 0934 '6\' 1"'$  (1.854 m)     Head Circumference --      Peak Flow --      Pain Score 05/09/21 0934 10     Pain Loc --      Pain Edu? --      Excl. in [place]? --     Constitutional: Alert and oriented. Well appearing and in no acute distress. Eyes: Conjunctivae are normal. EOMI. pupils are equal and reactive. Head: Atraumatic. Nose: No congestion/rhinnorhea. Mouth/Throat: Mucous membranes are moist.   Neck: No stridor. Trachea Midline. FROM Cardiovascular: Normal rate, regular rhythm. Grossly normal heart sounds.  Good peripheral circulation. Respiratory: Clear lungs, no increased work of breathing Gastrointestinal: Soft and nontender. No distention. No abdominal bruits.  Musculoskeletal: 1+ edema bilaterally.  Compression socks noted no joint effusions. Neurologic:  Normal speech and language. No gross focal neurologic deficits are appreciated.  Equal strength in arms and legs.  Cranial nerves appear intact. Skin:  Skin is warm, dry and intact. No rash noted. Psychiatric: Mood and affect are normal. Speech and behavior are normal. GU: Deferred   ____________________________________________   LABS (all labs ordered are listed, but only abnormal results are displayed)  Labs Reviewed  CBC - Abnormal; Notable for the following components:      Result Value   HCT 38.6 (*)    All other components within normal limits  BRAIN NATRIURETIC PEPTIDE - Abnormal;  Notable for the following components:   B Natriuretic Peptide 105.7 (*)    All other components within normal limits  COMPREHENSIVE METABOLIC PANEL - Abnormal; Notable for the following components:   Glucose, Bld 164 (*)    Albumin 3.4 (*)    All other components within normal limits  RESP PANEL BY RT-PCR (FLU A&B, COVID) [id number]  TROPONIN I (HIGH SENSITIVITY)  TROPONIN I (HIGH SENSITIVITY)   ____________________________________________   ED ECG REPORT I, Vanessa [place], the attending physician, personally viewed and interpreted this ECG.  Normal sinus rate of 86, right bundle branch block, no ST elevation, T wave version aVL  Repeat EKG looks similar to prior ____________________________________________  RADIOLOGY I, Vanessa [place], personally viewed and evaluated these images (plain radiographs) as part of my medical decision making, as well as reviewing the written report by the radiologist.  ED MD interpretation: No pulmonary edema  Official radiology report(s): DG Chest 2 View  Result Date: 05/09/2021 CLINICAL DATA:  50 year old male with persistent shortness of breath. Lower extremity swelling. EXAM: CHEST - 2 VIEW COMPARISON:  Chest CTA yesterday, and earlier. FINDINGS: Stable left chest AICD. Lung volumes and mediastinal contours are stable since last year, within normal limits. Pulmonary vascularity appears stable. No pneumothorax, pulmonary edema, pleural effusion or acute pulmonary opacity. No acute osseous abnormality identified. Negative visible bowel gas pattern. IMPRESSION: No acute cardiopulmonary abnormality. Electronically Signed   By: Genevie Ann M.D.   On: 05/09/2021 10:56   CT Head Wo Contrast  Result Date: 05/09/2021 CLINICAL DATA:  50 year old male with persistent shortness of breath. Dizziness. Severe headache. EXAM: CT HEAD WITHOUT CONTRAST TECHNIQUE: Contiguous axial images were obtained from the base of the skull through the vertex without intravenous contrast.  COMPARISON:  Head CT 04/20/2010. FINDINGS: Brain: No midline shift, ventriculomegaly, mass effect, evidence of mass lesion, intracranial hemorrhage or evidence of cortically based acute infarction.  Gray-white matter differentiation is within normal limits throughout the brain. Cerebral volume remains normal. Vascular: No suspicious intracranial vascular hyperdensity. Skull: Negative. Sinuses/Orbits: Scattered mild mucosal thickening in the bilateral paranasal sinuses. Improved left maxillary and sphenoid sinus aeration compared to 2011. tympanic cavities and mastoids are clear. Other: No acute orbit or scalp soft tissue finding. IMPRESSION: 1. Normal noncontrast CT appearance of the brain. 2. Mild bilateral paranasal sinus inflammation. Electronically Signed   By: Genevie Ann M.D.   On: 05/09/2021 10:58    ____________________________________________   PROCEDURES  Procedure(s) performed (including Critical Care):  .1-3 Lead EKG Interpretation  Date/Time: 05/09/2021 1:10 PM Performed by: Vanessa [place], MD Authorized by: Vanessa , MD     Interpretation: normal     ECG rate:  70s   ECG rate assessment: normal     Rhythm: sinus rhythm     Ectopy: none     Conduction: normal     ____________________________________________   INITIAL IMPRESSION / ASSESSMENT AND PLAN / ED COURSE   RIP SCHOENBAUER was evaluated in Emergency Department on 05/09/2021 for the symptoms described in the history of present illness. He was evaluated in the context of the global COVID-19 pandemic, which necessitated consideration that the patient might be at risk for infection with the SARS-CoV-2 virus that causes COVID-19. Institutional protocols and algorithms that pertain to the evaluation of patients at risk for COVID-19 are in a state of rapid change based on information released by regulatory bodies including the CDC and federal and state organizations. These policies and algorithms were followed during the patient's  care in the ED.     Pt presents with SOB.  Patient had very thorough work-up over a day ago that was reassuring thought to be just secondary to CHF.  We will give a dose of IV Lasix.  Differential includes: PNA-will get xray to evaluation Anemia-CBC to evaluate ACS- will get trops Arrhythmia-Will get EKG and keep on monitor.  COVID- will get testing per algorithm. PE-lower suspicion given no risk factors and other cause more likely, recent CT was negative     For headache seems more likely just a migraine but given he does not have a history of this we will get CT head to make sure no evidence of mass, bleed given it started 1 hour ago.  However does not sound like it was sudden or severe in onset to suggest subarachnoid to warrant needing LP especially given CT was scan was done so soon from onset   Cardiac markers are negative.  No evidence of anemia.  BNP is slightly improved from yesterday.  Patient's COVID test was negative and CT head which shows some sinus disease.  Discussed with patient he has had a lot of cough and congestion for the past few days.  We will treat patient for sinusitis with a course of antibiotics.  We will provide patient a new prescription for Lasix.  We will ambulate patient to make sure no desaturation anticipate patient be discharged home.  Patient feels comfortable going home and will continue to follow-up with cardiology.  In the meantime I can place a heart failure referral for patient   Patient reports headache is now resolved  Patient is on digoxin but denies any symptoms to suggest toxicity from this.  He states that he has been taking his normal dose has not had any changes to it and has been taking it as prescribed so this is unlikely to be causing her  shortness of breath.    Reevaluated patient and feels comfortable with discharge home with his new prescription        ____________________________________________   FINAL CLINICAL IMPRESSION(S) /  ED DIAGNOSES   Final diagnoses:  Acute sinusitis, recurrence not specified, unspecified location  Acute on chronic heart failure, unspecified heart failure type [facility])     MEDICATIONS GIVEN DURING THIS VISIT:  Medications  acetaminophen (TYLENOL) tablet 1,000 mg (1,000 mg Oral Given 05/09/21 1035)  metoCLOPramide (REGLAN) injection 10 mg (10 mg Intravenous Given 05/09/21 1035)  diphenhydrAMINE (BENADRYL) injection 12.5 mg (12.5 mg Intravenous Given 05/09/21 1035)  furosemide (LASIX) injection 40 mg (40 mg Intravenous Given 05/09/21 1034)     ED Discharge Orders          Ordered    amoxicillin-clavulanate (AUGMENTIN) 875-125 MG tablet  2 times daily        05/09/21 1317    furosemide (LASIX) 20 MG tablet  2 times daily        05/09/21 1317    AMB referral to CHF clinic        05/09/21 1317             Note:  This document was prepared using Dragon voice recognition software and may include unintentional dictation errors.   Vanessa [place], MD 05/09/21 1318    Vanessa [place], MD 05/09/21 [phone]

## 2021-05-09 NOTE — ED Notes (Signed)
Pt hooked and up admitted to bedside cardiac monitor. Pt provided with warm blanket for comfort. Lights dimmed per pt request. Bed in lowest position and locked with bed rails up X 2. Call light within reach. No S/S of distress noted.

## 2021-05-12 NOTE — Progress Notes (Signed)
Patient ID: Joseph [name], male    DOB: 25-Feb-1971, 50 y.o.   MRN: [id number]  HPI  Joseph [name] is a 50 y/o male with a history of asthma, CAD, DM, GERD, hyperlipidemia, HTN, obstructive sleep apnea, seizures & chronic heart failure.   Echo report from 11/24/16 reviewed and showed an EF of 25-30% along with mild/moderate Joseph.   LHC on 11/23/16 showed: No high-grade coronary artery lesion to explain EKG changes and acute respiratory distress. There is moderate in-stent restenosis of the ostial/proximal LAD stent, as well as mild to moderate disease involving small jailed D1 and mid LAD. Severely reduced LV dysfunction with LVEF of approximately 25-30%. Mildly elevated left ventricular filling pressure, though evaluation is limited by significant respiratory variation.  Was in the ED 05/09/21 due to shortness of breath and headache. Given IV lasix with improvement of symptoms and he was released. Was in the ED 05/08/21 due to shortness of breath. Chest CTA done due to elevated d-dimer. Given IV lasix with improvement and subsequent increase of oral   She presents today for his initial visit with a chief complaint of minimal shortness of breath upon moderate exertion. He describes this as chronic in nature having been present for several weeks. He does feel like his breathing has improved since he was last here in the ED. He has associated fatigue, cough, pedal edema and palpitations. He denies any difficulty sleeping, dizziness, chest pain, abdominal distention or weight gain.   He does wear compression socks daily although didn't wear them today since he was coming to the clinic. Has recently started a new job and now has insurance.   Past Medical History:  Diagnosis Date   AICD (automatic cardioverter/defibrillator) present 2011   Asthma    Cancer ([place])    prostate   CHF (congestive heart failure) ([place])    Coronary artery disease    Diabetes mellitus without complication ([place])    Dyspnea    ED  (erectile dysfunction)    GERD (gastroesophageal reflux disease)    Hyperlipidemia    Hypertension    Ischemic cardiomyopathy    LADA (latent autoimmune diabetes in adults), managed as type 1 ([place])    Myocardial infarction ([place]) 2011   anterior MI at [place] s/p BMS to ostial/proximal LAD   Neuromuscular disorder ([place])    problems in arms. finger tips have been numb/tingling. voltaren works   Obstructive sleep apnea 2018   cpap. has not used lately. uses a breathe-right strip on nose   Seizures ([place])    diabetes seizures (low blood sugar); last one [id number]   Systolic heart failure ([place])    Vitamin D deficiency    Past Surgical History:  Procedure Laterality Date   CARDIAC DEFIBRILLATOR PLACEMENT  08/30/2010   CHOLECYSTECTOMY N/A 11/07/2017   Procedure: LAPAROSCOPIC CHOLECYSTECTOMY;  Surgeon: Herbert Pun, MD;  Location: [facility] ORS;  Service: General;  Laterality: N/A;   CORONARY ANGIOPLASTY  2011   stent placed x 1   CYSTOSCOPY N/A 01/03/2020   Procedure: CYSTOSCOPY FLEXIBLE;  Surgeon: Hollice Espy, MD;  Location: [facility] ORS;  Service: Urology;  Laterality: N/A;   ESOPHAGOGASTRODUODENOSCOPY (EGD) WITH PROPOFOL N/A 03/20/2018   Procedure: ESOPHAGOGASTRODUODENOSCOPY (EGD) WITH PROPOFOL;  Surgeon: Manya Silvas, MD;  Location: [facility] ENDOSCOPY;  Service: Endoscopy;  Laterality: N/A;   LEFT HEART CATH AND CORONARY ANGIOGRAPHY N/A 11/23/2016   Procedure: Left Heart Cath and Coronary Angiography;  Surgeon: Nelva Bush, MD;  Location: [place] CV LAB;  Service: Cardiovascular;  Laterality: N/A;   PROSTATE BIOPSY  2019   PROSTATE BIOPSY N/A 10/23/2019   Procedure: PROSTATE BIOPSY;  Surgeon: Abbie Sons, MD;  Location: [facility] ORS;  Service: Urology;  Laterality: N/A;   ROBOT ASSISTED LAPAROSCOPIC RADICAL PROSTATECTOMY N/A 01/03/2020   Procedure: XI ROBOTIC ASSISTED LAPAROSCOPIC RADICAL PROSTATECTOMY;  Surgeon: Hollice Espy, MD;  Location: [facility] ORS;  Service: Urology;   Laterality: N/A;   TRANSRECTAL ULTRASOUND N/A 10/23/2019   Procedure: TRANSRECTAL ULTRASOUND;  Surgeon: Abbie Sons, MD;  Location: [facility] ORS;  Service: Urology;  Laterality: N/A;   WRIST SURGERY Right 2008   Family History  Problem Relation Age of Onset   Hypertension Mother    Heart attack Paternal Grandmother    Heart attack Father    Bladder Cancer Neg Hx    Kidney cancer Neg Hx    Prostate cancer Neg Hx    Social History   Tobacco Use   Smoking status: Never   Smokeless tobacco: Never  Substance Use Topics   Alcohol use: No   Allergies  Allergen Reactions   Vancomycin Shortness Of Breath   Lisinopril Rash   Prior to Admission medications   Medication Sig Start Date End Date Taking? Authorizing Provider  acetaminophen (TYLENOL) 500 MG tablet Take 500 mg by mouth every 6 (six) hours as needed for fever.   Yes [provider]  albuterol (PROVENTIL HFA) 108 (90 Base) MCG/ACT inhaler Inhale 2 puffs into the lungs every 4 (four) hours as needed for wheezing or shortness of breath. 10/08/18  Yes Carrie Mew, MD  amoxicillin-clavulanate (AUGMENTIN) 875-125 MG tablet Take 1 tablet by mouth 2 (two) times daily for 7 days. 05/09/21 05/16/21 Yes Vanessa [place], MD  aspirin EC 81 MG tablet Take 81 mg by mouth every morning.   Yes [provider]  atorvastatin (LIPITOR) 40 MG tablet Take 40 mg by mouth daily. 06/08/20  Yes [provider]  carvedilol (COREG) 12.5 MG tablet Take 12.5 mg by mouth daily. 05/28/20  Yes [provider]  Cholecalciferol 25 MCG (1000 UT) capsule Take 1,000 Units by mouth daily.   Yes [provider]  digoxin (LANOXIN) 0.125 MG tablet Take 1 tablet (125 mcg total) by mouth daily. 01/15/20  Yes Lorella Nimrod, MD  fluticasone (FLONASE) 50 MCG/ACT nasal spray Place 2 sprays into both nostrils daily as needed for allergies.  01/26/16  Yes [provider]  furosemide (LASIX) 20 MG tablet Take 1 tablet (20 mg  total) by mouth 2 (two) times daily. 05/09/21 06/08/21 Yes Vanessa [place], MD  gentamicin cream (GARAMYCIN) 0.1 % Apply 1 application topically 2 (two) times daily. 12/26/20  Yes Edrick Kins, DPM  glucagon 1 MG injection Inject into the muscle. 02/07/18  Yes [provider]  glucagon, human recombinant, (GLUCAGEN) 1 MG injection Inject into the muscle. 02/07/18  Yes [provider]  insulin aspart (NOVOLOG) 100 UNIT/ML FlexPen Inject 18-20-22 u TID AC plus sliding scale for up to 75 units TDD 01/10/20  Yes [provider]  insulin degludec (TRESIBA FLEXTOUCH) 200 UNIT/ML FlexTouch Pen Inject 50 Units into the skin daily.  01/29/20  Yes [provider]  Insulin Pen Needle (B-D ULTRAFINE III SHORT PEN) 31G X 8 MM MISC USE 5 TIMES DAILY AS  DIRECTED 12/01/19  Yes Carrie Mew, MD  loratadine (CLARITIN) 10 MG tablet Take 10 mg by mouth daily as needed.    Yes [provider]  losartan (COZAAR) 100 MG tablet Take 1  tablet (100 mg total) by mouth at bedtime. 12/01/19  Yes Carrie Mew, MD  naproxen sodium (ALEVE) 220 MG tablet Take 220 mg by mouth daily as needed.   Yes [provider]  potassium chloride (KLOR-CON) 10 MEQ tablet Take by mouth. 04/01/20  Yes [provider]  spironolactone (ALDACTONE) 25 MG tablet Take 0.5 tablets (12.5 mg total) by mouth daily. 01/15/20  Yes Lorella Nimrod, MD  SYMBICORT 160-4.5 MCG/ACT inhaler Inhale 2 puffs into the lungs 2 (two) times daily. 05/27/20  Yes [provider]     Review of Systems  Constitutional:  Positive for fatigue. Negative for appetite change.  HENT:  Negative for congestion, postnasal drip and sore throat.   Eyes: Negative.   Respiratory:  Positive for cough (productive) and shortness of breath. Negative for chest tightness.   Cardiovascular:  Positive for palpitations and leg swelling (improving). Negative for chest pain.  Gastrointestinal:  Negative for abdominal distention  and abdominal pain.  Endocrine: Negative.   Genitourinary: Negative.   Musculoskeletal:  Negative for back pain and neck pain.  Skin: Negative.   Allergic/Immunologic: Negative.   Neurological:  Negative for dizziness and light-headedness.  Hematological:  Negative for adenopathy. Does not bruise/bleed easily.  Psychiatric/Behavioral:  Negative for dysphoric mood and sleep disturbance (sleeping on 2 pillows). The patient is not nervous/anxious.    Vitals:   05/13/21 1408  BP: 122/75  Pulse: 93  Resp: 18  SpO2: 98%  Weight: 222 lb 8 oz (100.9 kg)  Height: '6\' 1"'$  (1.854 m)   Wt Readings from Last 3 Encounters:  05/13/21 222 lb 8 oz (100.9 kg)  05/09/21 219 lb (99.3 kg)  05/08/21 219 lb (99.3 kg)   Lab Results  Component Value Date   CREATININE 0.86 05/09/2021   CREATININE 0.71 05/08/2021   CREATININE 1.14 09/24/2020    Physical Exam Vitals and nursing note reviewed.  Constitutional:      Appearance: Normal appearance.  HENT:     Head: Normocephalic and atraumatic.  Cardiovascular:     Rate and Rhythm: Normal rate and regular rhythm.  Pulmonary:     Effort: Pulmonary effort is normal. No respiratory distress.     Breath sounds: No wheezing or rales.  Abdominal:     General: There is no distension.     Palpations: Abdomen is soft.  Musculoskeletal:        General: No tenderness.     Cervical back: Normal range of motion and neck supple.     Right lower leg: Edema (1+ pitting) present.     Left lower leg: Edema (trace pitting) present.  Skin:    General: Skin is warm and dry.  Neurological:     General: No focal deficit present.     Mental Status: He is alert and oriented to person, place, and time.  Psychiatric:        Mood and Affect: Mood normal.        Behavior: Behavior normal.        Thought Content: Thought content normal.   Assessment & Plan:  1: Chronic heart failure with reduced ejection fraction- - NYHA class II - euvolemic today - weighing daily;  reminded to call for an overnight weight gain of > 2 pounds or a weekly weight gain of > 5 pounds - not adding salt to his food and has been trying to read food labels for sodium content - has AICD present - saw cardiology ([facility]) 10/18/19; returns 05/28/21 - on  GDMT of carvedilol, losartan and spironolactone; type 1 DM so not a candidate for SGLT2 - he is to finish out his losartan & then begin entresto 97/'103mg'$  BID - 30 day entresto voucher given so he gets the first 30 days free and then the commercial $10 copay card given to him as well - he says that he would like his next appt after 07/28/21 if possible because that will be after his 90 days of work and he will then start accruing time off - order given for BMP ~ 1 month after he starts entresto - saw pulmonology Lanney Gins) 02/06/20 - BNP 05/09/21 was 105.7  2: HTN- - BP looks good today - sees PCP at [place] 05/09/21 reviewed and showed sodium 137, potassium 3.9, creatinine 0.86 and GFR >60  3: Type 1 DM- - A1c 09/29/20 was 9.5%   Patient did not bring his medications nor a list. Each medication was verbally reviewed with the patient and he was encouraged to bring the bottles to every visit to confirm accuracy of list.   Return in 2 months after October 18th or sooner for any questions/problems before then.

## 2021-05-13 ENCOUNTER — Encounter: Payer: Self-pay | Admitting: Family

## 2021-05-13 ENCOUNTER — Ambulatory Visit: Payer: 59 | Attending: Family | Admitting: Family

## 2021-05-13 ENCOUNTER — Other Ambulatory Visit: Payer: Self-pay

## 2021-05-13 VITALS — BP 122/75 | HR 93 | Resp 18 | Ht 73.0 in | Wt 222.5 lb

## 2021-05-13 DIAGNOSIS — I5022 Chronic systolic (congestive) heart failure: Secondary | ICD-10-CM | POA: Diagnosis not present

## 2021-05-13 DIAGNOSIS — Z881 Allergy status to other antibiotic agents status: Secondary | ICD-10-CM | POA: Insufficient documentation

## 2021-05-13 DIAGNOSIS — E109 Type 1 diabetes mellitus without complications: Secondary | ICD-10-CM | POA: Insufficient documentation

## 2021-05-13 DIAGNOSIS — K219 Gastro-esophageal reflux disease without esophagitis: Secondary | ICD-10-CM | POA: Insufficient documentation

## 2021-05-13 DIAGNOSIS — Z955 Presence of coronary angioplasty implant and graft: Secondary | ICD-10-CM | POA: Diagnosis not present

## 2021-05-13 DIAGNOSIS — I110 Hypertensive heart disease with heart failure: Secondary | ICD-10-CM | POA: Insufficient documentation

## 2021-05-13 DIAGNOSIS — Z888 Allergy status to other drugs, medicaments and biological substances status: Secondary | ICD-10-CM | POA: Diagnosis not present

## 2021-05-13 DIAGNOSIS — E785 Hyperlipidemia, unspecified: Secondary | ICD-10-CM | POA: Diagnosis not present

## 2021-05-13 DIAGNOSIS — Y838 Other surgical procedures as the cause of abnormal reaction of the patient, or of later complication, without mention of misadventure at the time of the procedure: Secondary | ICD-10-CM | POA: Diagnosis not present

## 2021-05-13 DIAGNOSIS — Z7951 Long term (current) use of inhaled steroids: Secondary | ICD-10-CM | POA: Diagnosis not present

## 2021-05-13 DIAGNOSIS — I340 Nonrheumatic mitral (valve) insufficiency: Secondary | ICD-10-CM | POA: Insufficient documentation

## 2021-05-13 DIAGNOSIS — G4733 Obstructive sleep apnea (adult) (pediatric): Secondary | ICD-10-CM | POA: Insufficient documentation

## 2021-05-13 DIAGNOSIS — Z9079 Acquired absence of other genital organ(s): Secondary | ICD-10-CM | POA: Diagnosis not present

## 2021-05-13 DIAGNOSIS — R569 Unspecified convulsions: Secondary | ICD-10-CM | POA: Diagnosis not present

## 2021-05-13 DIAGNOSIS — I2510 Atherosclerotic heart disease of native coronary artery without angina pectoris: Secondary | ICD-10-CM | POA: Diagnosis not present

## 2021-05-13 DIAGNOSIS — Z79899 Other long term (current) drug therapy: Secondary | ICD-10-CM | POA: Diagnosis not present

## 2021-05-13 DIAGNOSIS — Z7984 Long term (current) use of oral hypoglycemic drugs: Secondary | ICD-10-CM | POA: Diagnosis not present

## 2021-05-13 DIAGNOSIS — Z791 Long term (current) use of non-steroidal anti-inflammatories (NSAID): Secondary | ICD-10-CM | POA: Diagnosis not present

## 2021-05-13 DIAGNOSIS — T82855D Stenosis of coronary artery stent, subsequent encounter: Secondary | ICD-10-CM | POA: Diagnosis not present

## 2021-05-13 DIAGNOSIS — Z95810 Presence of automatic (implantable) cardiac defibrillator: Secondary | ICD-10-CM | POA: Diagnosis not present

## 2021-05-13 DIAGNOSIS — I10 Essential (primary) hypertension: Secondary | ICD-10-CM

## 2021-05-13 DIAGNOSIS — Z8249 Family history of ischemic heart disease and other diseases of the circulatory system: Secondary | ICD-10-CM | POA: Insufficient documentation

## 2021-05-13 DIAGNOSIS — Z794 Long term (current) use of insulin: Secondary | ICD-10-CM | POA: Insufficient documentation

## 2021-05-13 DIAGNOSIS — E119 Type 2 diabetes mellitus without complications: Secondary | ICD-10-CM

## 2021-05-13 MED ORDER — SACUBITRIL-VALSARTAN 97-103 MG PO TABS: 1.0000 | ORAL_TABLET | Freq: Two times a day (BID) | ORAL | 5 refills | Status: DC

## 2021-05-13 NOTE — Patient Instructions (Addendum)
Continue weighing daily and call for an overnight weight gain of > 2 pounds or a weekly weight gain of >5 pounds.    Finish taking your losartan. Once you finish your current bottle, you will not take losartan anymore and will begin entresto 97/'103mg'$  as 1 tablet in the morning and 1 tablet in the evening.    1 month after beginning entresto, you will need to get lab work drawn.

## 2021-05-19 DIAGNOSIS — I255 Ischemic cardiomyopathy: Secondary | ICD-10-CM | POA: Diagnosis not present

## 2021-05-19 DIAGNOSIS — I10 Essential (primary) hypertension: Secondary | ICD-10-CM | POA: Diagnosis not present

## 2021-05-19 DIAGNOSIS — Z95810 Presence of automatic (implantable) cardiac defibrillator: Secondary | ICD-10-CM | POA: Diagnosis not present

## 2021-05-19 DIAGNOSIS — I2510 Atherosclerotic heart disease of native coronary artery without angina pectoris: Secondary | ICD-10-CM | POA: Diagnosis not present

## 2021-05-25 DIAGNOSIS — I255 Ischemic cardiomyopathy: Secondary | ICD-10-CM | POA: Diagnosis not present

## 2021-05-25 DIAGNOSIS — I10 Essential (primary) hypertension: Secondary | ICD-10-CM | POA: Diagnosis not present

## 2021-05-25 DIAGNOSIS — I2510 Atherosclerotic heart disease of native coronary artery without angina pectoris: Secondary | ICD-10-CM | POA: Diagnosis not present

## 2021-05-25 DIAGNOSIS — Z95810 Presence of automatic (implantable) cardiac defibrillator: Secondary | ICD-10-CM | POA: Diagnosis not present

## 2021-05-29 ENCOUNTER — Ambulatory Visit: Payer: 59 | Admitting: Podiatry

## 2021-07-31 ENCOUNTER — Ambulatory Visit: Payer: 59 | Admitting: Family

## 2021-08-04 NOTE — Progress Notes (Signed)
Patient ID: Joseph [name], male    DOB: [birth year], 50 y.o.   MRN: [id number]  HPI  Joseph [name] is a 50 y/o male with a history of asthma, CAD, DM, GERD, hyperlipidemia, HTN, obstructive sleep apnea, seizures & chronic heart failure.   Echo report from 11/24/16 reviewed and showed an EF of 25-30% along with mild/moderate Joseph.   LHC on 11/23/16 showed: No high-grade coronary artery lesion to explain EKG changes and acute respiratory distress. There is moderate in-stent restenosis of the ostial/proximal LAD stent, as well as mild to moderate disease involving small jailed D1 and mid LAD. Severely reduced LV dysfunction with LVEF of approximately 25-30%. Mildly elevated left ventricular filling pressure, though evaluation is limited by significant respiratory variation.  Was in the ED 05/09/21 due to shortness of breath and headache. Given IV lasix with improvement of symptoms and he was released. Was in the ED 05/08/21 due to shortness of breath. Chest CTA done due to elevated d-dimer. Given IV lasix with improvement and subsequent increase of oral   He presents today for a follow-up visit with a chief complaint of minimal shortness of breath with moderate exertion. He describes this as chronic in nature having been present for several years although does notice that it's worse when he bends over to put on his compression socks/ shoes then it previously was. He has associated fatigue, productive cough, wheezing, pedal edema, palpitations, head congestion, sneezing and dizziness (when coughing) along with this. He denies any difficulty sleeping, abdominal distention or chest pain.   Hasn't been weighing consistently but did notice when he put some pants on the other day that they were too tight around the waist.   He's unsure of his current shortness of breath, cough & wheezing are due to allergies or possible fluid.   Past Medical History:  Diagnosis Date   AICD (automatic cardioverter/defibrillator)  present 2011   Asthma    Cancer ([place])    prostate   CHF (congestive heart failure) ([place])    Coronary artery disease    Diabetes mellitus without complication ([place])    Dyspnea    ED (erectile dysfunction)    GERD (gastroesophageal reflux disease)    Hyperlipidemia    Hypertension    Ischemic cardiomyopathy    LADA (latent autoimmune diabetes in adults), managed as type 1 ([place])    Myocardial infarction ([place]) 2011   anterior MI at [place] s/p BMS to ostial/proximal LAD   Neuromuscular disorder ([place])    problems in arms. finger tips have been numb/tingling. voltaren works   Obstructive sleep apnea 2018   cpap. has not used lately. uses a breathe-right strip on nose   Seizures ([place])    diabetes seizures (low blood sugar); last one [id number]   Systolic heart failure ([place])    Vitamin D deficiency    Past Surgical History:  Procedure Laterality Date   CARDIAC DEFIBRILLATOR PLACEMENT  08/30/2010   CHOLECYSTECTOMY N/A 11/07/2017   Procedure: LAPAROSCOPIC CHOLECYSTECTOMY;  Surgeon: Herbert Pun, MD;  Location: [facility] ORS;  Service: General;  Laterality: N/A;   CORONARY ANGIOPLASTY  2011   stent placed x 1   CYSTOSCOPY N/A 01/03/2020   Procedure: CYSTOSCOPY FLEXIBLE;  Surgeon: Hollice Espy, MD;  Location: [facility] ORS;  Service: Urology;  Laterality: N/A;   ESOPHAGOGASTRODUODENOSCOPY (EGD) WITH PROPOFOL N/A 03/20/2018   Procedure: ESOPHAGOGASTRODUODENOSCOPY (EGD) WITH PROPOFOL;  Surgeon: Manya Silvas, MD;  Location: [facility] ENDOSCOPY;  Service: Endoscopy;  Laterality: N/A;   LEFT HEART  CATH AND CORONARY ANGIOGRAPHY N/A 11/23/2016   Procedure: Left Heart Cath and Coronary Angiography;  Surgeon: Nelva Bush, MD;  Location: [place] CV LAB;  Service: Cardiovascular;  Laterality: N/A;   PROSTATE BIOPSY  2019   PROSTATE BIOPSY N/A 10/23/2019   Procedure: PROSTATE BIOPSY;  Surgeon: Abbie Sons, MD;  Location: [facility] ORS;  Service: Urology;  Laterality: N/A;   ROBOT ASSISTED  LAPAROSCOPIC RADICAL PROSTATECTOMY N/A 01/03/2020   Procedure: XI ROBOTIC ASSISTED LAPAROSCOPIC RADICAL PROSTATECTOMY;  Surgeon: Hollice Espy, MD;  Location: [facility] ORS;  Service: Urology;  Laterality: N/A;   TRANSRECTAL ULTRASOUND N/A 10/23/2019   Procedure: TRANSRECTAL ULTRASOUND;  Surgeon: Abbie Sons, MD;  Location: [facility] ORS;  Service: Urology;  Laterality: N/A;   WRIST SURGERY Right 2008   Family History  Problem Relation Age of Onset   Hypertension Mother    Heart attack Paternal Grandmother    Heart attack Father    Bladder Cancer Neg Hx    Kidney cancer Neg Hx    Prostate cancer Neg Hx    Social History   Tobacco Use   Smoking status: Never   Smokeless tobacco: Never  Substance Use Topics   Alcohol use: No   Allergies  Allergen Reactions   Vancomycin Shortness Of Breath   Lisinopril Rash   Prior to Admission medications   Medication Sig Start Date End Date Taking? Authorizing Provider  acetaminophen (TYLENOL) 500 MG tablet Take 500 mg by mouth every 6 (six) hours as needed for fever.   Yes [provider]  albuterol (PROVENTIL HFA) 108 (90 Base) MCG/ACT inhaler Inhale 2 puffs into the lungs every 4 (four) hours as needed for wheezing or shortness of breath. 10/08/18  Yes Carrie Mew, MD  aspirin EC 81 MG tablet Take 81 mg by mouth every morning.   Yes [provider]  atorvastatin (LIPITOR) 40 MG tablet Take 40 mg by mouth daily. 06/08/20  Yes [provider]  carvedilol (COREG) 12.5 MG tablet Take 12.5 mg by mouth daily. 05/28/20  Yes [provider]  Cholecalciferol 25 MCG (1000 UT) capsule Take 1,000 Units by mouth daily.   Yes [provider]  digoxin (LANOXIN) 0.125 MG tablet Take 1 tablet (125 mcg total) by mouth daily. 01/15/20  Yes Lorella Nimrod, MD  fluticasone (FLONASE) 50 MCG/ACT nasal spray Place 2 sprays into both nostrils daily as needed for allergies.  01/26/16  Yes [provider]  furosemide  (LASIX) 20 MG tablet Take 1 tablet (20 mg total) by mouth 2 (two) times daily. 05/09/21  Yes Vanessa [place], MD  gentamicin cream (GARAMYCIN) 0.1 % Apply 1 application topically 2 (two) times daily. 12/26/20  Yes Edrick Kins, DPM  glucagon 1 MG injection Inject into the muscle. 02/07/18  Yes [provider]  glucagon, human recombinant, (GLUCAGEN) 1 MG injection Inject into the muscle. 02/07/18  Yes [provider]  insulin aspart (NOVOLOG) 100 UNIT/ML FlexPen Inject 18-20-22 u TID AC plus sliding scale for up to 75 units TDD 01/10/20  Yes [provider]  insulin degludec (TRESIBA FLEXTOUCH) 200 UNIT/ML FlexTouch Pen Inject 50 Units into the skin daily.  01/29/20  Yes [provider]  Insulin Pen Needle (B-D ULTRAFINE III SHORT PEN) 31G X 8 MM MISC USE 5 TIMES DAILY AS  DIRECTED 12/01/19  Yes Carrie Mew, MD  loratadine (CLARITIN) 10 MG tablet Take 10 mg by mouth daily as needed.    Yes [provider]  naproxen sodium (  ALEVE) 220 MG tablet Take 220 mg by mouth daily as needed.   Yes [provider]  potassium chloride (KLOR-CON) 10 MEQ tablet Take by mouth. 04/01/20  Yes [provider]  sacubitril-valsartan (ENTRESTO) 97-103 MG Take 1 tablet by mouth 2 (two) times daily. 05/13/21  Yes Darylene Price A, FNP  spironolactone (ALDACTONE) 25 MG tablet Take 0.5 tablets (12.5 mg total) by mouth daily. 01/15/20  Yes Lorella Nimrod, MD  SYMBICORT 160-4.5 MCG/ACT inhaler Inhale 2 puffs into the lungs 2 (two) times daily. 05/27/20  Yes [provider]    Review of Systems  Constitutional:  Positive for fatigue. Negative for appetite change.  HENT:  Positive for congestion and sneezing. Negative for postnasal drip and sore throat.   Eyes: Negative.   Respiratory:  Positive for cough (productive), shortness of breath and wheezing. Negative for chest tightness.   Cardiovascular:  Positive for palpitations and leg swelling (R>L). Negative for  chest pain.  Gastrointestinal:  Negative for abdominal distention and abdominal pain.  Endocrine: Negative.   Genitourinary: Negative.   Musculoskeletal:  Negative for back pain and neck pain.  Skin: Negative.   Allergic/Immunologic: Negative.   Neurological:  Positive for dizziness (when coughing). Negative for light-headedness.  Hematological:  Negative for adenopathy. Does not bruise/bleed easily.  Psychiatric/Behavioral:  Negative for dysphoric mood and sleep disturbance (sleeping on 2 pillows). The patient is not nervous/anxious.    Vitals:   08/06/21 0802  BP: (!) 142/72  Pulse: 94  Resp: 18  SpO2: 96%  Weight: 225 lb 2 oz (102.1 kg)  Height: 6\' 1"  (1.854 m)   Wt Readings from Last 3 Encounters:  08/06/21 225 lb 2 oz (102.1 kg)  05/13/21 222 lb 8 oz (100.9 kg)  05/09/21 219 lb (99.3 kg)   Lab Results  Component Value Date   CREATININE 0.86 05/09/2021   CREATININE 0.71 05/08/2021   CREATININE 1.14 09/24/2020    Physical Exam Vitals and nursing note reviewed.  Constitutional:      Appearance: Normal appearance.  HENT:     Head: Normocephalic and atraumatic.  Cardiovascular:     Rate and Rhythm: Normal rate and regular rhythm.  Pulmonary:     Effort: Pulmonary effort is normal. No respiratory distress.     Breath sounds: No wheezing or rales.  Abdominal:     General: There is distension.  Musculoskeletal:        General: No tenderness.     Cervical back: Normal range of motion and neck supple.     Right lower leg: Edema (1+ pitting) present.     Left lower leg: Edema (trace pitting) present.  Skin:    General: Skin is warm and dry.  Neurological:     General: No focal deficit present.     Mental Status: He is alert and oriented to person, place, and time.  Psychiatric:        Mood and Affect: Mood normal.        Behavior: Behavior normal.        Thought Content: Thought content normal.   Assessment & Plan:  1: Chronic heart failure with reduced  ejection fraction- - NYHA class II - euvolemic today - weighing daily; reminded to call for an overnight weight gain of > 2 pounds or a weekly weight gain of > 5 pounds - weight up 3 pounds from last visit here 11 weeks ago - not adding salt to his food and has been trying to read food  labels for sodium content - has AICD present - saw cardiology ([facility]) 05/25/21; returns Nov 2022 - have messaged cardiology about ordering echo as last one was done 2018 - on GDMT of carvedilol, entresto and spironolactone; type 1 DM so not a candidate for SGLT2 - will change his diuretic to torsemide 40mg  daily; advised him to start it tomorrow and when he starts it, he will stop taking the torsemide - will check BMP next visit - saw pulmonology Lanney Gins) 02/06/20 - wearing compression socks daily - may want to try a different allergy medication - BNP 05/09/21 was 105.7  2: HTN- - BP mildly elevated (142/72); changing diuretic per above - sees PCP at [place] 05/09/21 reviewed and showed sodium 137, potassium 3.9, creatinine 0.86 and GFR >60  3: Type 1 DM- - A1c 09/29/20 was 9.5% - home glucose levels fluctuating   Patient did not bring his medications nor a list. Each medication was verbally reviewed with the patient and he was encouraged to bring the bottles to every visit to confirm accuracy of list.   Return in 2 weeks or sooner for any questions/problems before then.

## 2021-08-06 ENCOUNTER — Other Ambulatory Visit: Payer: Self-pay

## 2021-08-06 ENCOUNTER — Ambulatory Visit: Payer: BC Managed Care – PPO | Attending: Family | Admitting: Family

## 2021-08-06 ENCOUNTER — Encounter: Payer: Self-pay | Admitting: Family

## 2021-08-06 VITALS — BP 142/72 | HR 94 | Resp 18 | Ht 73.0 in | Wt 225.1 lb

## 2021-08-06 DIAGNOSIS — K219 Gastro-esophageal reflux disease without esophagitis: Secondary | ICD-10-CM | POA: Insufficient documentation

## 2021-08-06 DIAGNOSIS — R002 Palpitations: Secondary | ICD-10-CM | POA: Diagnosis not present

## 2021-08-06 DIAGNOSIS — Z7951 Long term (current) use of inhaled steroids: Secondary | ICD-10-CM | POA: Diagnosis not present

## 2021-08-06 DIAGNOSIS — I2510 Atherosclerotic heart disease of native coronary artery without angina pectoris: Secondary | ICD-10-CM | POA: Insufficient documentation

## 2021-08-06 DIAGNOSIS — E119 Type 2 diabetes mellitus without complications: Secondary | ICD-10-CM | POA: Diagnosis not present

## 2021-08-06 DIAGNOSIS — I10 Essential (primary) hypertension: Secondary | ICD-10-CM

## 2021-08-06 DIAGNOSIS — R42 Dizziness and giddiness: Secondary | ICD-10-CM | POA: Insufficient documentation

## 2021-08-06 DIAGNOSIS — G4733 Obstructive sleep apnea (adult) (pediatric): Secondary | ICD-10-CM | POA: Insufficient documentation

## 2021-08-06 DIAGNOSIS — E785 Hyperlipidemia, unspecified: Secondary | ICD-10-CM | POA: Insufficient documentation

## 2021-08-06 DIAGNOSIS — Z79899 Other long term (current) drug therapy: Secondary | ICD-10-CM | POA: Diagnosis not present

## 2021-08-06 DIAGNOSIS — Z7901 Long term (current) use of anticoagulants: Secondary | ICD-10-CM | POA: Insufficient documentation

## 2021-08-06 DIAGNOSIS — Z7982 Long term (current) use of aspirin: Secondary | ICD-10-CM | POA: Diagnosis not present

## 2021-08-06 DIAGNOSIS — Z794 Long term (current) use of insulin: Secondary | ICD-10-CM | POA: Insufficient documentation

## 2021-08-06 DIAGNOSIS — R058 Other specified cough: Secondary | ICD-10-CM | POA: Diagnosis not present

## 2021-08-06 DIAGNOSIS — E109 Type 1 diabetes mellitus without complications: Secondary | ICD-10-CM | POA: Insufficient documentation

## 2021-08-06 DIAGNOSIS — J45909 Unspecified asthma, uncomplicated: Secondary | ICD-10-CM | POA: Insufficient documentation

## 2021-08-06 DIAGNOSIS — R0602 Shortness of breath: Secondary | ICD-10-CM | POA: Diagnosis not present

## 2021-08-06 DIAGNOSIS — Z95810 Presence of automatic (implantable) cardiac defibrillator: Secondary | ICD-10-CM | POA: Diagnosis not present

## 2021-08-06 DIAGNOSIS — Z888 Allergy status to other drugs, medicaments and biological substances status: Secondary | ICD-10-CM | POA: Insufficient documentation

## 2021-08-06 DIAGNOSIS — I110 Hypertensive heart disease with heart failure: Secondary | ICD-10-CM | POA: Diagnosis not present

## 2021-08-06 DIAGNOSIS — R067 Sneezing: Secondary | ICD-10-CM | POA: Diagnosis not present

## 2021-08-06 DIAGNOSIS — Z8249 Family history of ischemic heart disease and other diseases of the circulatory system: Secondary | ICD-10-CM | POA: Diagnosis not present

## 2021-08-06 DIAGNOSIS — Z955 Presence of coronary angioplasty implant and graft: Secondary | ICD-10-CM | POA: Diagnosis not present

## 2021-08-06 DIAGNOSIS — I5022 Chronic systolic (congestive) heart failure: Secondary | ICD-10-CM

## 2021-08-06 MED ORDER — TORSEMIDE 20 MG PO TABS: 40.0000 mg | ORAL_TABLET | Freq: Every day | ORAL | 5 refills | Status: DC

## 2021-08-06 NOTE — Patient Instructions (Addendum)
Resume weighing daily and call for an overnight weight gain of > 2 pounds or a weekly weight gain of >5 pounds.    Begin taking torsemide 2 tablets daily starting tomorrow (Friday). When you start this, you will stop taking furosemide

## 2021-08-19 DIAGNOSIS — I255 Ischemic cardiomyopathy: Secondary | ICD-10-CM | POA: Diagnosis not present

## 2021-08-19 DIAGNOSIS — I2510 Atherosclerotic heart disease of native coronary artery without angina pectoris: Secondary | ICD-10-CM | POA: Diagnosis not present

## 2021-08-19 DIAGNOSIS — I10 Essential (primary) hypertension: Secondary | ICD-10-CM | POA: Diagnosis not present

## 2021-08-19 DIAGNOSIS — Z95810 Presence of automatic (implantable) cardiac defibrillator: Secondary | ICD-10-CM | POA: Diagnosis not present

## 2021-08-22 NOTE — Progress Notes (Deleted)
Patient ID: Joseph [name], male    DOB: 1970-10-12, 50 y.o.   MRN: [id number]  HPI  Joseph [name] is a 50 y/o male with a history of asthma, CAD, DM, GERD, hyperlipidemia, HTN, obstructive sleep apnea, seizures & chronic heart failure.   Echo report from 11/24/16 reviewed and showed an EF of 25-30% along with mild/moderate Joseph.   LHC on 11/23/16 showed: No high-grade coronary artery lesion to explain EKG changes and acute respiratory distress. There is moderate in-stent restenosis of the ostial/proximal LAD stent, as well as mild to moderate disease involving small jailed D1 and mid LAD. Severely reduced LV dysfunction with LVEF of approximately 25-30%. Mildly elevated left ventricular filling pressure, though evaluation is limited by significant respiratory variation.  Was in the ED 05/09/21 due to shortness of breath and headache. Given IV lasix with improvement of symptoms and he was released. Was in the ED 05/08/21 due to shortness of breath. Chest CTA done due to elevated d-dimer. Given IV lasix with improvement and subsequent increase of oral   He presents today for a follow-up visit with a chief complaint of   Past Medical History:  Diagnosis Date   AICD (automatic cardioverter/defibrillator) present 2011   Asthma    Cancer ([place])    prostate   CHF (congestive heart failure) ([place])    Coronary artery disease    Diabetes mellitus without complication ([place])    Dyspnea    ED (erectile dysfunction)    GERD (gastroesophageal reflux disease)    Hyperlipidemia    Hypertension    Ischemic cardiomyopathy    LADA (latent autoimmune diabetes in adults), managed as type 1 ([place])    Myocardial infarction ([place]) 2011   anterior MI at [place] s/p BMS to ostial/proximal LAD   Neuromuscular disorder ([place])    problems in arms. finger tips have been numb/tingling. voltaren works   Obstructive sleep apnea 2018   cpap. has not used lately. uses a breathe-right strip on nose   Seizures ([place])    diabetes  seizures (low blood sugar); last one 53/0051   Systolic heart failure ([place])    Vitamin D deficiency    Past Surgical History:  Procedure Laterality Date   CARDIAC DEFIBRILLATOR PLACEMENT  08/30/2010   CHOLECYSTECTOMY N/A 11/07/2017   Procedure: LAPAROSCOPIC CHOLECYSTECTOMY;  Surgeon: Herbert Pun, MD;  Location: [facility] ORS;  Service: General;  Laterality: N/A;   CORONARY ANGIOPLASTY  2011   stent placed x 1   CYSTOSCOPY N/A 01/03/2020   Procedure: CYSTOSCOPY FLEXIBLE;  Surgeon: Hollice Espy, MD;  Location: [facility] ORS;  Service: Urology;  Laterality: N/A;   ESOPHAGOGASTRODUODENOSCOPY (EGD) WITH PROPOFOL N/A 03/20/2018   Procedure: ESOPHAGOGASTRODUODENOSCOPY (EGD) WITH PROPOFOL;  Surgeon: Manya Silvas, MD;  Location: [facility] ENDOSCOPY;  Service: Endoscopy;  Laterality: N/A;   LEFT HEART CATH AND CORONARY ANGIOGRAPHY N/A 11/23/2016   Procedure: Left Heart Cath and Coronary Angiography;  Surgeon: Nelva Bush, MD;  Location: [facility] CV LAB;  Service: Cardiovascular;  Laterality: N/A;   PROSTATE BIOPSY  2019   PROSTATE BIOPSY N/A 10/23/2019   Procedure: PROSTATE BIOPSY;  Surgeon: Abbie Sons, MD;  Location: [facility] ORS;  Service: Urology;  Laterality: N/A;   ROBOT ASSISTED LAPAROSCOPIC RADICAL PROSTATECTOMY N/A 01/03/2020   Procedure: XI ROBOTIC ASSISTED LAPAROSCOPIC RADICAL PROSTATECTOMY;  Surgeon: Hollice Espy, MD;  Location: [facility] ORS;  Service: Urology;  Laterality: N/A;   TRANSRECTAL ULTRASOUND N/A 10/23/2019   Procedure: TRANSRECTAL ULTRASOUND;  Surgeon: Abbie Sons, MD;  Location:  [facility] ORS;  Service: Urology;  Laterality: N/A;   WRIST SURGERY Right 2008   Family History  Problem Relation Age of Onset   Hypertension Mother    Heart attack Paternal Grandmother    Heart attack Father    Bladder Cancer Neg Hx    Kidney cancer Neg Hx    Prostate cancer Neg Hx    Social History   Tobacco Use   Smoking status: Never   Smokeless tobacco: Never  Substance Use  Topics   Alcohol use: No   Allergies  Allergen Reactions   Vancomycin Shortness Of Breath   Lisinopril Rash     Review of Systems  Constitutional:  Positive for fatigue. Negative for appetite change.  HENT:  Positive for congestion and sneezing. Negative for postnasal drip and sore throat.   Eyes: Negative.   Respiratory:  Positive for cough (productive), shortness of breath and wheezing. Negative for chest tightness.   Cardiovascular:  Positive for palpitations and leg swelling (R>L). Negative for chest pain.  Gastrointestinal:  Negative for abdominal distention and abdominal pain.  Endocrine: Negative.   Genitourinary: Negative.   Musculoskeletal:  Negative for back pain and neck pain.  Skin: Negative.   Allergic/Immunologic: Negative.   Neurological:  Positive for dizziness (when coughing). Negative for light-headedness.  Hematological:  Negative for adenopathy. Does not bruise/bleed easily.  Psychiatric/Behavioral:  Negative for dysphoric mood and sleep disturbance (sleeping on 2 pillows). The patient is not nervous/anxious.       Physical Exam Vitals and nursing note reviewed.  Constitutional:      Appearance: Normal appearance.  HENT:     Head: Normocephalic and atraumatic.  Cardiovascular:     Rate and Rhythm: Normal rate and regular rhythm.  Pulmonary:     Effort: Pulmonary effort is normal. No respiratory distress.     Breath sounds: No wheezing or rales.  Abdominal:     General: There is distension.  Musculoskeletal:        General: No tenderness.     Cervical back: Normal range of motion and neck supple.     Right lower leg: Edema (1+ pitting) present.     Left lower leg: Edema (trace pitting) present.  Skin:    General: Skin is warm and dry.  Neurological:     General: No focal deficit present.     Mental Status: He is alert and oriented to person, place, and time.  Psychiatric:        Mood and Affect: Mood normal.        Behavior: Behavior normal.         Thought Content: Thought content normal.   Assessment & Plan:  1: Chronic heart failure with reduced ejection fraction- - NYHA class II - euvolemic today - weighing daily; reminded to call for an overnight weight gain of > 2 pounds or a weekly weight gain of > 5 pounds - weight 225.2 pounds from last visit here 2 weeks ago - not adding salt to his food and has been trying to read food labels for sodium content - has AICD present - saw cardiology ([place]) 08/19/21 - have scheduled echo for  - on GDMT of carvedilol, entresto and spironolactone; type 1 DM so not a candidate for SGLT2 - torsemide started at last visit - will check BMP today - saw pulmonology Lanney Gins) 02/06/20 - wearing compression socks daily - BNP 05/09/21 was 105.7  2: HTN- - BP  - sees PCP at [place]  05/09/21 reviewed and showed sodium 137, potassium 3.9, creatinine 0.86 and GFR >60  3: Type 1 DM- - A1c 09/29/20 was 9.5% - home glucose levels fluctuating   Patient did not bring his medications nor a list. Each medication was verbally reviewed with the patient and he was encouraged to bring the bottles to every visit to confirm accuracy of list.

## 2021-08-24 ENCOUNTER — Telehealth: Payer: Self-pay | Admitting: Family

## 2021-08-24 ENCOUNTER — Ambulatory Visit: Payer: 59 | Admitting: Family

## 2021-08-24 NOTE — Telephone Encounter (Signed)
Patient did not show for his [facility] appointment on 08/24/21. Will attempt to reschedule.

## 2021-09-02 ENCOUNTER — Other Ambulatory Visit: Payer: Self-pay

## 2021-09-02 DIAGNOSIS — C61 Malignant neoplasm of prostate: Secondary | ICD-10-CM

## 2021-09-05 ENCOUNTER — Emergency Department
Admission: EM | Admit: 2021-09-05 | Discharge: 2021-09-05 | Disposition: A | Discharge status: Home / Self Care | Payer: BC Managed Care – PPO | Attending: Emergency Medicine | Admitting: Emergency Medicine

## 2021-09-05 ENCOUNTER — Other Ambulatory Visit: Payer: Self-pay

## 2021-09-05 ENCOUNTER — Emergency Department: Payer: BC Managed Care – PPO

## 2021-09-05 ENCOUNTER — Encounter: Payer: Self-pay | Admitting: Emergency Medicine

## 2021-09-05 DIAGNOSIS — I110 Hypertensive heart disease with heart failure: Secondary | ICD-10-CM | POA: Insufficient documentation

## 2021-09-05 DIAGNOSIS — R1013 Epigastric pain: Secondary | ICD-10-CM | POA: Diagnosis not present

## 2021-09-05 DIAGNOSIS — E860 Dehydration: Secondary | ICD-10-CM | POA: Diagnosis not present

## 2021-09-05 DIAGNOSIS — Z8546 Personal history of malignant neoplasm of prostate: Secondary | ICD-10-CM | POA: Insufficient documentation

## 2021-09-05 DIAGNOSIS — Z79899 Other long term (current) drug therapy: Secondary | ICD-10-CM | POA: Diagnosis not present

## 2021-09-05 DIAGNOSIS — E119 Type 2 diabetes mellitus without complications: Secondary | ICD-10-CM | POA: Insufficient documentation

## 2021-09-05 DIAGNOSIS — B9789 Other viral agents as the cause of diseases classified elsewhere: Secondary | ICD-10-CM | POA: Diagnosis not present

## 2021-09-05 DIAGNOSIS — K529 Noninfective gastroenteritis and colitis, unspecified: Secondary | ICD-10-CM | POA: Diagnosis not present

## 2021-09-05 DIAGNOSIS — I2510 Atherosclerotic heart disease of native coronary artery without angina pectoris: Secondary | ICD-10-CM | POA: Insufficient documentation

## 2021-09-05 DIAGNOSIS — I5022 Chronic systolic (congestive) heart failure: Secondary | ICD-10-CM | POA: Insufficient documentation

## 2021-09-05 DIAGNOSIS — J45909 Unspecified asthma, uncomplicated: Secondary | ICD-10-CM | POA: Diagnosis not present

## 2021-09-05 DIAGNOSIS — Z20822 Contact with and (suspected) exposure to covid-19: Secondary | ICD-10-CM | POA: Insufficient documentation

## 2021-09-05 DIAGNOSIS — Z8616 Personal history of COVID-19: Secondary | ICD-10-CM | POA: Insufficient documentation

## 2021-09-05 DIAGNOSIS — K219 Gastro-esophageal reflux disease without esophagitis: Secondary | ICD-10-CM | POA: Insufficient documentation

## 2021-09-05 DIAGNOSIS — J069 Acute upper respiratory infection, unspecified: Secondary | ICD-10-CM | POA: Diagnosis not present

## 2021-09-05 DIAGNOSIS — J101 Influenza due to other identified influenza virus with other respiratory manifestations: Secondary | ICD-10-CM

## 2021-09-05 DIAGNOSIS — Z7982 Long term (current) use of aspirin: Secondary | ICD-10-CM | POA: Diagnosis not present

## 2021-09-05 DIAGNOSIS — Z794 Long term (current) use of insulin: Secondary | ICD-10-CM | POA: Diagnosis not present

## 2021-09-05 DIAGNOSIS — R0602 Shortness of breath: Secondary | ICD-10-CM | POA: Diagnosis not present

## 2021-09-05 DIAGNOSIS — I10 Essential (primary) hypertension: Secondary | ICD-10-CM | POA: Diagnosis not present

## 2021-09-05 LAB — COMPREHENSIVE METABOLIC PANEL
ALT: 28 U/L (ref 0–44)
AST: 29 U/L (ref 15–41)
Albumin: 3.7 g/dL (ref 3.5–5.0)
Alkaline Phosphatase: 125 U/L (ref 38–126)
Anion gap: 9 (ref 5–15)
BUN: 31 mg/dL — ABNORMAL HIGH (ref 6–20)
CO2: 24 mmol/L (ref 22–32)
Calcium: 8.5 mg/dL — ABNORMAL LOW (ref 8.9–10.3)
Chloride: 100 mmol/L (ref 98–111)
Creatinine, Ser: 1.38 mg/dL — ABNORMAL HIGH (ref 0.61–1.24)
GFR, Estimated: >60 mL/min (ref >60)
Glucose, Bld: 217 mg/dL — ABNORMAL HIGH (ref 70–99)
Potassium: 3.7 mmol/L (ref 3.5–5.1)
Sodium: 133 mmol/L — ABNORMAL LOW (ref 135–145)
Total Bilirubin: 1.1 mg/dL (ref 0.3–1.2)
Total Protein: 7.8 g/dL (ref 6.5–8.1)

## 2021-09-05 LAB — TROPONIN I (HIGH SENSITIVITY)
Troponin I (High Sensitivity): 21 ng/L — ABNORMAL HIGH (ref <18)
Troponin I (High Sensitivity): 20 ng/L — ABNORMAL HIGH (ref <18)

## 2021-09-05 LAB — RESP PANEL BY RT-PCR (FLU A&B, COVID) ARPGX2
Influenza A by PCR: POSITIVE — AB
Influenza B by PCR: NEGATIVE
SARS Coronavirus 2 by RT PCR: NEGATIVE

## 2021-09-05 LAB — CBC
HCT: 46.2 % (ref 39.0–52.0)
Hemoglobin: 15.8 g/dL (ref 13.0–17.0)
MCH: 30.6 pg (ref 26.0–34.0)
MCHC: 34.2 g/dL (ref 30.0–36.0)
MCV: 89.4 fL (ref 80.0–100.0)
Platelets: 174 10*3/uL (ref 150–400)
RBC: 5.17 MIL/uL (ref 4.22–5.81)
RDW: 12.5 % (ref 11.5–15.5)
WBC: 6.3 10*3/uL (ref 4.0–10.5)
nRBC: 0 % (ref 0.0–0.2)

## 2021-09-05 LAB — LIPASE, BLOOD: Lipase: 21 U/L (ref 11–51)

## 2021-09-05 LAB — BRAIN NATRIURETIC PEPTIDE: B Natriuretic Peptide: 42.2 pg/mL (ref 0.0–100.0)

## 2021-09-05 MED ORDER — ONDANSETRON 4 MG PO TBDP
4.0000 mg | ORAL_TABLET | Freq: Once | ORAL | Status: AC
  Administered 2021-09-05: 4 mg via ORAL
  Filled 2021-09-05: qty 1

## 2021-09-05 MED ORDER — ONDANSETRON HCL 4 MG PO TABS: 4.0000 mg | ORAL_TABLET | Freq: Three times a day (TID) | ORAL | 0 refills | Status: DC | PRN

## 2021-09-05 MED ORDER — DICYCLOMINE HCL 10 MG PO CAPS
10.0000 mg | ORAL_CAPSULE | Freq: Once | ORAL | Status: AC
  Administered 2021-09-05: 10 mg via ORAL
  Filled 2021-09-05: qty 1

## 2021-09-05 MED ORDER — ALUM & MAG HYDROXIDE-SIMETH 200-200-20 MG/5ML PO SUSP
15.0000 mL | Freq: Once | ORAL | Status: AC
  Administered 2021-09-05: 15 mL via ORAL
  Filled 2021-09-05: qty 30

## 2021-09-05 MED ORDER — DICYCLOMINE HCL 10 MG PO CAPS: 10.0000 mg | ORAL_CAPSULE | Freq: Three times a day (TID) | ORAL | 0 refills | Status: DC

## 2021-09-05 NOTE — ED Notes (Signed)
Light green tube sent to lab for rpepeat troponin

## 2021-09-05 NOTE — ED Triage Notes (Signed)
Pt reports mid epigastric pain, SOB, NVD since Wednesday. Pt reports pain in upper abd is sharp, shooting and pressure like. Pt also reports hx of CHF and has been getting more SOB.

## 2021-09-05 NOTE — ED Notes (Signed)
Dc ppw provided. PT denies any questions at this time. Pt provides verbal consent for DC. pt RX information given. PT assisted off unit on foot

## 2021-09-05 NOTE — Discharge Instructions (Addendum)
As discussed please follow-up your COVID influenza PCR results on MyChart.  In addition you must have your kidney function rechecked next week and ensure you are drinking adequate fluids in the meantime.  Please return if you experience any new or acute worsening of your symptoms.

## 2021-09-05 NOTE — ED Provider Notes (Addendum)
[facility] Emergency Department Provider Note  ____________________________________________   Event Date/Time   First MD Initiated Contact with Patient 09/05/21 1245     (approximate)  I have reviewed the triage vital signs and the nursing notes.   HISTORY  Chief Complaint Abdominal Pain, Nausea, Diarrhea, and Shortness of Breath   HPI Joseph [name] is a 50 y.o. male with past medical history of asthma, prostate cancer, CHF, HTN, HDL, OSA, and CAD with ischemic cardiomyopathy status post AICD who presents for assessment approximately 3 days of some nonbloody nonbilious vomiting, nonbloody diarrhea and epigastric pain.  Patient has also had some cough and congestion.  No chest pain, sore throat, fevers, back pain, burning with urination, lower abdominal pain, rash or extremity pain.  No falls, injuries, headache, earache, sore throat.  Patient denies tobacco abuse, EtOH use or illicit drug use.  No other acute concerns at this time.         Past Medical History:  Diagnosis Date   AICD (automatic cardioverter/defibrillator) present 2011   Asthma    Cancer ([place])    prostate   CHF (congestive heart failure) ([place])    Coronary artery disease    Diabetes mellitus without complication ([place])    Dyspnea    ED (erectile dysfunction)    GERD (gastroesophageal reflux disease)    Hyperlipidemia    Hypertension    Ischemic cardiomyopathy    LADA (latent autoimmune diabetes in adults), managed as type 1 ([place])    Myocardial infarction ([place]) 2011   anterior MI at [place] s/p BMS to ostial/proximal LAD   Neuromuscular disorder ([place])    problems in arms. finger tips have been numb/tingling. voltaren works   Obstructive sleep apnea 2018   cpap. has not used lately. uses a breathe-right strip on nose   Seizures ([place])    diabetes seizures (low blood sugar); last one 04/3709   Systolic heart failure ([place])    Vitamin D deficiency     Patient Active Problem List    Diagnosis Date Noted   Pneumonia due to COVID-19 virus 09/19/2020   Diabetes mellitus without complication ([place])    Hyperglycemia due to type 1 diabetes mellitus ([place]) 07/17/2020   Diabetic foot ulcer associated with type 1 diabetes mellitus ([place]) 07/17/2020   Cellulitis 07/14/2020   Sepsis ([place]) 07/14/2020   Cystitis    Scrotal pain    CAD (coronary artery disease) 01/12/2020   S/P prostatectomy 01/12/2020   Severe asthma without complication 62/69/4854   UTI (urinary tract infection) 01/12/2020   Possible Postprocedural intraabdominal abscess 01/12/2020   Prostate cancer ([place]) 01/03/2020   Preop cardiovascular exam 10/18/2019   Abdominal bloating 04/12/2019   Chronic diarrhea 04/12/2019   Gastritis and duodenitis 04/12/2019   Dyskinesia of gallbladder 11/07/2017   Elevated PSA 07/25/2017   Erectile dysfunction associated with type 2 diabetes mellitus ([place]) 07/25/2017   Hypertension 03/01/2017   Obstructive sleep apnea on CPAP 62/70/3500   Chronic systolic CHF (congestive heart failure) ([place]) 11/24/2016   Shortness of breath    Abnormal EKG    DKA (diabetic ketoacidoses) 04/18/2016   CAP (community acquired pneumonia) 04/18/2016   Accelerated hypertension 04/18/2016   Abdominal pain 04/18/2016   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[place]) 08/19/2015   Poorly controlled type 1 diabetes mellitus ([place]) 08/07/2015   Required emergent intubation 03/07/2014   LADA (latent autoimmune diabetes in adults), managed as type 1 ([place]) 09/15/2011   GERD without esophagitis 08/18/2011   Hyperlipemia 08/18/2011   Implantable  cardioverter-defibrillator (ICD) in situ 08/18/2011   Ischemic cardiomyopathy 08/18/2011   MI (myocardial infarction) ([place]) 03/11/2010    Past Surgical History:  Procedure Laterality Date   CARDIAC DEFIBRILLATOR PLACEMENT  08/30/2010   CHOLECYSTECTOMY N/A 11/07/2017   Procedure: LAPAROSCOPIC CHOLECYSTECTOMY;  Surgeon: Herbert Pun, MD;  Location: [facility] ORS;  Service: General;   Laterality: N/A;   CORONARY ANGIOPLASTY  2011   stent placed x 1   CYSTOSCOPY N/A 01/03/2020   Procedure: CYSTOSCOPY FLEXIBLE;  Surgeon: Hollice Espy, MD;  Location: [facility] ORS;  Service: Urology;  Laterality: N/A;   ESOPHAGOGASTRODUODENOSCOPY (EGD) WITH PROPOFOL N/A 03/20/2018   Procedure: ESOPHAGOGASTRODUODENOSCOPY (EGD) WITH PROPOFOL;  Surgeon: Manya Silvas, MD;  Location: [facility] ENDOSCOPY;  Service: Endoscopy;  Laterality: N/A;   LEFT HEART CATH AND CORONARY ANGIOGRAPHY N/A 11/23/2016   Procedure: Left Heart Cath and Coronary Angiography;  Surgeon: Nelva Bush, MD;  Location: [facility] CV LAB;  Service: Cardiovascular;  Laterality: N/A;   PROSTATE BIOPSY  2019   PROSTATE BIOPSY N/A 10/23/2019   Procedure: PROSTATE BIOPSY;  Surgeon: Abbie Sons, MD;  Location: [facility] ORS;  Service: Urology;  Laterality: N/A;   ROBOT ASSISTED LAPAROSCOPIC RADICAL PROSTATECTOMY N/A 01/03/2020   Procedure: XI ROBOTIC ASSISTED LAPAROSCOPIC RADICAL PROSTATECTOMY;  Surgeon: Hollice Espy, MD;  Location: [facility] ORS;  Service: Urology;  Laterality: N/A;   TRANSRECTAL ULTRASOUND N/A 10/23/2019   Procedure: TRANSRECTAL ULTRASOUND;  Surgeon: Abbie Sons, MD;  Location: [facility] ORS;  Service: Urology;  Laterality: N/A;   WRIST SURGERY Right 2008    Prior to Admission medications   Medication Sig Start Date End Date Taking? Authorizing Provider  dicyclomine (BENTYL) 10 MG capsule Take 1 capsule (10 mg total) by mouth 4 (four) times daily -  before meals and at bedtime for 5 days. 09/05/21 09/10/21 Yes Lucrezia Starch, MD  ondansetron (ZOFRAN) 4 MG tablet Take 1 tablet (4 mg total) by mouth every 8 (eight) hours as needed for up to 10 doses for nausea or vomiting. 09/05/21  Yes Lucrezia Starch, MD  acetaminophen (TYLENOL) 500 MG tablet Take 500 mg by mouth every 6 (six) hours as needed for fever.    [provider]  albuterol (PROVENTIL HFA) 108 (90 Base) MCG/ACT inhaler Inhale 2 puffs into the  lungs every 4 (four) hours as needed for wheezing or shortness of breath. 10/08/18   Carrie Mew, MD  aspirin EC 81 MG tablet Take 81 mg by mouth every morning.    [provider]  atorvastatin (LIPITOR) 40 MG tablet Take 40 mg by mouth daily. 06/08/20   [provider]  carvedilol (COREG) 12.5 MG tablet Take 12.5 mg by mouth daily. 05/28/20   [provider]  Cholecalciferol 25 MCG (1000 UT) capsule Take 1,000 Units by mouth daily.    [provider]  digoxin (LANOXIN) 0.125 MG tablet Take 1 tablet (125 mcg total) by mouth daily. 01/15/20   Lorella Nimrod, MD  fluticasone (FLONASE) 50 MCG/ACT nasal spray Place 2 sprays into both nostrils daily as needed for allergies.  01/26/16   [provider]  gentamicin cream (GARAMYCIN) 0.1 % Apply 1 application topically 2 (two) times daily. 12/26/20   Edrick Kins, DPM  glucagon 1 MG injection Inject into the muscle. 02/07/18   [provider]  glucagon, human recombinant, (GLUCAGEN) 1 MG injection Inject into the muscle. 02/07/18   [provider]  insulin aspart (NOVOLOG) 100 UNIT/ML FlexPen Inject 18-20-22 u TID AC plus sliding scale  for up to 75 units TDD 01/10/20   [provider]  insulin degludec (TRESIBA FLEXTOUCH) 200 UNIT/ML FlexTouch Pen Inject 50 Units into the skin daily.  01/29/20   [provider]  Insulin Pen Needle (B-D ULTRAFINE III SHORT PEN) 31G X 8 MM MISC USE 5 TIMES DAILY AS  DIRECTED 12/01/19   Carrie Mew, MD  loratadine (CLARITIN) 10 MG tablet Take 10 mg by mouth daily as needed.     [provider]  naproxen sodium (ALEVE) 220 MG tablet Take 220 mg by mouth daily as needed.    [provider]  potassium chloride (KLOR-CON) 10 MEQ tablet Take by mouth. 04/01/20   [provider]  sacubitril-valsartan (ENTRESTO) 97-103 MG Take 1 tablet by mouth 2 (two) times daily. 05/13/21   Alisa Graff, FNP  spironolactone (ALDACTONE) 25 MG  tablet Take 0.5 tablets (12.5 mg total) by mouth daily. 01/15/20   Lorella Nimrod, MD  SYMBICORT 160-4.5 MCG/ACT inhaler Inhale 2 puffs into the lungs 2 (two) times daily. 05/27/20   [provider]  torsemide (DEMADEX) 20 MG tablet Take 2 tablets (40 mg total) by mouth daily. 08/06/21 11/04/21  Alisa Graff, FNP    Allergies Vancomycin and Lisinopril  Family History  Problem Relation Age of Onset   Hypertension Mother    Heart attack Paternal Grandmother    Heart attack Father    Bladder Cancer Neg Hx    Kidney cancer Neg Hx    Prostate cancer Neg Hx     Social History Social History   Tobacco Use   Smoking status: Never   Smokeless tobacco: Never  Vaping Use   Vaping Use: Never used  Substance Use Topics   Alcohol use: No   Drug use: No    Review of Systems  Review of Systems  Constitutional:  Negative for chills and fever.  HENT:  Negative for sore throat.   Eyes:  Negative for pain.  Respiratory:  Positive for shortness of breath. Negative for cough and stridor.   Cardiovascular:  Negative for chest pain.  Gastrointestinal:  Positive for abdominal pain, nausea and vomiting.  Genitourinary:  Negative for dysuria.  Musculoskeletal:  Negative for myalgias.  Skin:  Negative for rash.  Neurological:  Negative for seizures, loss of consciousness and headaches.  Psychiatric/Behavioral:  Negative for suicidal ideas.   All other systems reviewed and are negative.    ____________________________________________   PHYSICAL EXAM:  VITAL SIGNS: ED Triage Vitals  Enc Vitals Group     BP 09/05/21 0712 121/76     Pulse Rate 09/05/21 0712 100     Resp 09/05/21 0712 20     Temp 09/05/21 0712 99.5 F (37.5 C)     Temp Source 09/05/21 0712 Oral     SpO2 09/05/21 0712 93 %     Weight 09/05/21 0708 225 lb (102.1 kg)     Height 09/05/21 0708 6\' 1"  (1.854 m)     Head Circumference --      Peak Flow --      Pain Score 09/05/21 0708 8     Pain Loc --      Pain  Edu? --      Excl. in [place]? --    Vitals:   09/05/21 0712 09/05/21 0857  BP: 121/76 108/75  Pulse: 100 100  Resp: 20 15  Temp: 99.5 F (37.5 C) 98.7 F (37.1 C)  SpO2: 93% 94%   Physical Exam Vitals and nursing  note reviewed.  Constitutional:      General: He is not in acute distress.    Appearance: He is well-developed.  HENT:     Head: Normocephalic and atraumatic.  Eyes:     Conjunctiva/sclera: Conjunctivae normal.  Cardiovascular:     Rate and Rhythm: Normal rate and regular rhythm.     Heart sounds: No murmur heard. Pulmonary:     Effort: Pulmonary effort is normal. No respiratory distress.     Breath sounds: Normal breath sounds.  Abdominal:     Palpations: Abdomen is soft.     Tenderness: There is abdominal tenderness in the epigastric area.  Musculoskeletal:        General: No swelling.     Cervical back: Neck supple.  Skin:    General: Skin is warm and dry.     Capillary Refill: Capillary refill takes less than 2 seconds.  Neurological:     Mental Status: He is alert.  Psychiatric:        Mood and Affect: Mood normal.     ____________________________________________   LABS (all labs ordered are listed, but only abnormal results are displayed)  Labs Reviewed  COMPREHENSIVE METABOLIC PANEL - Abnormal; Notable for the following components:      Result Value   Sodium 133 (*)    Glucose, Bld 217 (*)    BUN 31 (*)    Creatinine, Ser 1.38 (*)    Calcium 8.5 (*)    All other components within normal limits  TROPONIN I (HIGH SENSITIVITY) - Abnormal; Notable for the following components:   Troponin I (High Sensitivity) 21 (*)    All other components within normal limits  TROPONIN I (HIGH SENSITIVITY) - Abnormal; Notable for the following components:   Troponin I (High Sensitivity) 20 (*)    All other components within normal limits  RESP PANEL BY RT-PCR (FLU A&B, COVID) ARPGX2  CBC  BRAIN NATRIURETIC PEPTIDE  LIPASE, BLOOD    ____________________________________________  EKG  ECG remarkable for sinus rhythm with a rate of Eisbach and nonspecific changes and anterior lateral and inferior leads concerning for possible ischemia.  Otherwise unremarkable intervals. ____________________________________________  RADIOLOGY  ED MD interpretation:    Chest x-ray has no evidence of focal consolidation, effusion, edema, pneumothorax or any other clear acute thoracic process.   Official radiology report(s): DG Chest 2 View  Result Date: 09/05/2021 CLINICAL DATA:  50 year old male with history of shortness of breath. Epigastric pain. EXAM: CHEST - 2 VIEW COMPARISON:  Chest x-ray 05/09/2021. FINDINGS: Left-sided pacemaker/AICD with lead tip terminating in the right ventricular apex. Lung volumes are normal. No consolidative airspace disease. No pleural effusions. No pneumothorax. No pulmonary nodule or mass noted. Pulmonary vasculature and the cardiomediastinal silhouette are within normal limits. IMPRESSION: No radiographic evidence of acute cardiopulmonary disease. Electronically Signed   By: Vinnie Langton M.D.   On: 09/05/2021 07:51    ____________________________________________   PROCEDURES  Procedure(s) performed (including Critical Care):  Procedures   ____________________________________________   INITIAL IMPRESSION / ASSESSMENT AND PLAN / ED COURSE      Patient presents with above-stated history exam for assessment of consolation of symptoms including some congestion, cough, nausea, vomiting, diarrhea and some epigastric discomfort.  On arrival patient is afebrile and hemodynamically stable.  His lungs are clear bilaterally and his abdomen is soft nontender throughout.  He does not appear septic or meningitic.  Suspect likely acute viral gastroenteritis and a component of a viral URI.  His abdomen is  some very mild epigastric discomfort suspect is from some gastritis.  No significant right upper  quadrant tenderness or elevation of cholestatic process on CMP to suggest cholecystitis.  CMP otherwise with a creatinine of 1.38 compared to 0.862 months ago and mild hyperglycemia without evidence of acidosis or other significant electrolyte or metabolic derangements.  Lipase is not consistent with pancreatitis.  No right lower quadrant tenderness, fevers, leukocytosis or left lower quadrant tenderness to suggest appendicitis or diverticulitis.  ECG remarkable for sinus rhythm with a rate of Eisbach and nonspecific changes and anterior lateral and inferior leads concerning for possible ischemia.  Otherwise unremarkable intervals.  Opponents minimally elevated but stable at 21 and 20 overall not consistent with ACS or myocarditis and some very mild demand ischemia.  BNP is double digits and overall patient does not appear volume overloaded.  Chest x-ray has no evidence of focal consolidation, effusion, edema, pneumothorax or any other clear acute thoracic process.   Patient states he is feeling much better after some Zofran and Bentyl and Protonix.  He is able tolerate p.o.  Discussed that kidney function is a little down today and that he should not drink adequate fluids over the weekend have this rechecked by PCP next week.  Rx written for Zofran and Bentyl.  Flu is positive.  Influenza is negative.  I suspect influenza is likely the primary source of patient symptoms today.     ____________________________________________   FINAL CLINICAL IMPRESSION(S) / ED DIAGNOSES  Final diagnoses:  Gastroenteritis  Viral URI  Mild dehydration    Medications  ondansetron (ZOFRAN-ODT) disintegrating tablet 4 mg (4 mg Oral Given 09/05/21 1310)  dicyclomine (BENTYL) capsule 10 mg (10 mg Oral Given 09/05/21 1311)  alum & mag hydroxide-simeth (MAALOX/MYLANTA) 200-200-20 MG/5ML suspension 15 mL (15 mLs Oral Given 09/05/21 1311)     ED Discharge Orders          Ordered    ondansetron (ZOFRAN) 4 MG  tablet  Every 8 hours PRN        09/05/21 1331    dicyclomine (BENTYL) 10 MG capsule  3 times daily before meals & bedtime        09/05/21 1331             Note:  This document was prepared using Dragon voice recognition software and may include unintentional dictation errors.    Lucrezia Starch, MD 09/05/21 1353    Lucrezia Starch, MD 09/05/21 1356

## 2021-09-10 ENCOUNTER — Other Ambulatory Visit: Payer: PRIVATE HEALTH INSURANCE

## 2021-09-11 ENCOUNTER — Other Ambulatory Visit: Payer: Self-pay

## 2021-09-11 ENCOUNTER — Emergency Department: Payer: BC Managed Care – PPO

## 2021-09-11 ENCOUNTER — Inpatient Hospital Stay
Admission: EM | Admit: 2021-09-11 | Discharge: 2021-09-17 | DRG: 871 | Disposition: A | Discharge status: Home Health | Payer: BC Managed Care – PPO | Attending: Obstetrics and Gynecology | Admitting: Obstetrics and Gynecology

## 2021-09-11 ENCOUNTER — Encounter: Payer: Self-pay | Admitting: Emergency Medicine

## 2021-09-11 DIAGNOSIS — R7989 Other specified abnormal findings of blood chemistry: Secondary | ICD-10-CM

## 2021-09-11 DIAGNOSIS — E119 Type 2 diabetes mellitus without complications: Secondary | ICD-10-CM

## 2021-09-11 DIAGNOSIS — E1010 Type 1 diabetes mellitus with ketoacidosis without coma: Secondary | ICD-10-CM | POA: Diagnosis present

## 2021-09-11 DIAGNOSIS — Z8616 Personal history of COVID-19: Secondary | ICD-10-CM | POA: Diagnosis not present

## 2021-09-11 DIAGNOSIS — T502X5A Adverse effect of carbonic-anhydrase inhibitors, benzothiadiazides and other diuretics, initial encounter: Secondary | ICD-10-CM | POA: Diagnosis not present

## 2021-09-11 DIAGNOSIS — Y92239 Unspecified place in hospital as the place of occurrence of the external cause: Secondary | ICD-10-CM | POA: Diagnosis not present

## 2021-09-11 DIAGNOSIS — R531 Weakness: Secondary | ICD-10-CM | POA: Diagnosis not present

## 2021-09-11 DIAGNOSIS — I2510 Atherosclerotic heart disease of native coronary artery without angina pectoris: Secondary | ICD-10-CM | POA: Diagnosis present

## 2021-09-11 DIAGNOSIS — E1165 Type 2 diabetes mellitus with hyperglycemia: Secondary | ICD-10-CM

## 2021-09-11 DIAGNOSIS — Z7982 Long term (current) use of aspirin: Secondary | ICD-10-CM

## 2021-09-11 DIAGNOSIS — G4733 Obstructive sleep apnea (adult) (pediatric): Secondary | ICD-10-CM | POA: Diagnosis present

## 2021-09-11 DIAGNOSIS — Z888 Allergy status to other drugs, medicaments and biological substances status: Secondary | ICD-10-CM

## 2021-09-11 DIAGNOSIS — E876 Hypokalemia: Secondary | ICD-10-CM | POA: Diagnosis not present

## 2021-09-11 DIAGNOSIS — I252 Old myocardial infarction: Secondary | ICD-10-CM

## 2021-09-11 DIAGNOSIS — Z794 Long term (current) use of insulin: Secondary | ICD-10-CM | POA: Diagnosis not present

## 2021-09-11 DIAGNOSIS — K219 Gastro-esophageal reflux disease without esophagitis: Secondary | ICD-10-CM | POA: Diagnosis present

## 2021-09-11 DIAGNOSIS — N179 Acute kidney failure, unspecified: Secondary | ICD-10-CM | POA: Diagnosis not present

## 2021-09-11 DIAGNOSIS — I255 Ischemic cardiomyopathy: Secondary | ICD-10-CM | POA: Diagnosis present

## 2021-09-11 DIAGNOSIS — A4189 Other specified sepsis: Principal | ICD-10-CM | POA: Diagnosis present

## 2021-09-11 DIAGNOSIS — I5022 Chronic systolic (congestive) heart failure: Secondary | ICD-10-CM | POA: Diagnosis present

## 2021-09-11 DIAGNOSIS — A419 Sepsis, unspecified organism: Principal | ICD-10-CM

## 2021-09-11 DIAGNOSIS — J45909 Unspecified asthma, uncomplicated: Secondary | ICD-10-CM | POA: Diagnosis present

## 2021-09-11 DIAGNOSIS — E785 Hyperlipidemia, unspecified: Secondary | ICD-10-CM | POA: Diagnosis present

## 2021-09-11 DIAGNOSIS — J1000 Influenza due to other identified influenza virus with unspecified type of pneumonia: Secondary | ICD-10-CM | POA: Diagnosis not present

## 2021-09-11 DIAGNOSIS — J101 Influenza due to other identified influenza virus with other respiratory manifestations: Secondary | ICD-10-CM

## 2021-09-11 DIAGNOSIS — I110 Hypertensive heart disease with heart failure: Secondary | ICD-10-CM | POA: Diagnosis not present

## 2021-09-11 DIAGNOSIS — Z79899 Other long term (current) drug therapy: Secondary | ICD-10-CM | POA: Diagnosis not present

## 2021-09-11 DIAGNOSIS — Z8249 Family history of ischemic heart disease and other diseases of the circulatory system: Secondary | ICD-10-CM | POA: Diagnosis not present

## 2021-09-11 DIAGNOSIS — J189 Pneumonia, unspecified organism: Secondary | ICD-10-CM

## 2021-09-11 DIAGNOSIS — Z8546 Personal history of malignant neoplasm of prostate: Secondary | ICD-10-CM

## 2021-09-11 DIAGNOSIS — R6520 Severe sepsis without septic shock: Secondary | ICD-10-CM | POA: Diagnosis not present

## 2021-09-11 DIAGNOSIS — J9601 Acute respiratory failure with hypoxia: Secondary | ICD-10-CM | POA: Diagnosis present

## 2021-09-11 DIAGNOSIS — R000 Tachycardia, unspecified: Secondary | ICD-10-CM | POA: Diagnosis not present

## 2021-09-11 DIAGNOSIS — C61 Malignant neoplasm of prostate: Secondary | ICD-10-CM | POA: Diagnosis present

## 2021-09-11 DIAGNOSIS — Z95810 Presence of automatic (implantable) cardiac defibrillator: Secondary | ICD-10-CM | POA: Diagnosis not present

## 2021-09-11 DIAGNOSIS — Z20822 Contact with and (suspected) exposure to covid-19: Secondary | ICD-10-CM | POA: Diagnosis not present

## 2021-09-11 DIAGNOSIS — Z7951 Long term (current) use of inhaled steroids: Secondary | ICD-10-CM

## 2021-09-11 DIAGNOSIS — E1065 Type 1 diabetes mellitus with hyperglycemia: Secondary | ICD-10-CM | POA: Diagnosis present

## 2021-09-11 DIAGNOSIS — R778 Other specified abnormalities of plasma proteins: Secondary | ICD-10-CM

## 2021-09-11 DIAGNOSIS — R079 Chest pain, unspecified: Secondary | ICD-10-CM | POA: Diagnosis not present

## 2021-09-11 LAB — TROPONIN I (HIGH SENSITIVITY)
Troponin I (High Sensitivity): 26 ng/L — ABNORMAL HIGH (ref <18)
Troponin I (High Sensitivity): 22 ng/L — ABNORMAL HIGH (ref <18)

## 2021-09-11 LAB — CBC
HCT: 40.6 % (ref 39.0–52.0)
Hemoglobin: 14.2 g/dL (ref 13.0–17.0)
MCH: 30.7 pg (ref 26.0–34.0)
MCHC: 35 g/dL (ref 30.0–36.0)
MCV: 87.7 fL (ref 80.0–100.0)
Platelets: 269 10*3/uL (ref 150–400)
RBC: 4.63 MIL/uL (ref 4.22–5.81)
RDW: 12.6 % (ref 11.5–15.5)
WBC: 18.9 10*3/uL — ABNORMAL HIGH (ref 4.0–10.5)
nRBC: 0 % (ref 0.0–0.2)

## 2021-09-11 LAB — CBG MONITORING, ED: Glucose-Capillary: 544 mg/dL (ref 70–99)

## 2021-09-11 LAB — BASIC METABOLIC PANEL
Anion gap: 14 (ref 5–15)
BUN: 50 mg/dL — ABNORMAL HIGH (ref 6–20)
CO2: 22 mmol/L (ref 22–32)
Calcium: 8.2 mg/dL — ABNORMAL LOW (ref 8.9–10.3)
Chloride: 89 mmol/L — ABNORMAL LOW (ref 98–111)
Creatinine, Ser: 1.66 mg/dL — ABNORMAL HIGH (ref 0.61–1.24)
GFR, Estimated: 50 mL/min — ABNORMAL LOW (ref >60)
Glucose, Bld: 535 mg/dL (ref 70–99)
Potassium: 4.2 mmol/L (ref 3.5–5.1)
Sodium: 125 mmol/L — ABNORMAL LOW (ref 135–145)

## 2021-09-11 LAB — RESP PANEL BY RT-PCR (FLU A&B, COVID) ARPGX2
Influenza A by PCR: POSITIVE — AB
Influenza B by PCR: NEGATIVE
SARS Coronavirus 2 by RT PCR: NEGATIVE

## 2021-09-11 LAB — LACTIC ACID, PLASMA
Lactic Acid, Venous: 2.1 mmol/L (ref 0.5–1.9)
Lactic Acid, Venous: 1.7 mmol/L (ref 0.5–1.9)

## 2021-09-11 LAB — CREATININE, SERUM
Creatinine, Ser: 2.76 mg/dL — ABNORMAL HIGH (ref 0.61–1.24)
GFR, Estimated: 27 mL/min — ABNORMAL LOW (ref >60)

## 2021-09-11 LAB — PROCALCITONIN: Procalcitonin: 8.87 ng/mL

## 2021-09-11 MED ORDER — SODIUM CHLORIDE 0.9 % IV SOLN
500.0000 mg | INTRAVENOUS | Status: DC
  Administered 2021-09-12 – 2021-09-13 (×2): 500 mg via INTRAVENOUS
  Filled 2021-09-11 (×4): qty 500

## 2021-09-11 MED ORDER — GUAIFENESIN-DM 100-10 MG/5ML PO SYRP
5.0000 mL | ORAL_SOLUTION | ORAL | Status: DC | PRN
  Administered 2021-09-12 – 2021-09-16 (×10): 5 mL via ORAL
  Filled 2021-09-11 (×10): qty 5

## 2021-09-11 MED ORDER — INSULIN ASPART 100 UNIT/ML IJ SOLN
0.0000 [IU] | Freq: Every day | INTRAMUSCULAR | Status: DC
Start: 2021-09-11 — End: 2021-09-12

## 2021-09-11 MED ORDER — ONDANSETRON HCL 4 MG/2ML IJ SOLN
4.0000 mg | Freq: Once | INTRAMUSCULAR | Status: AC
  Administered 2021-09-11: 4 mg via INTRAVENOUS
  Filled 2021-09-11: qty 2

## 2021-09-11 MED ORDER — SODIUM CHLORIDE 0.9 % IV SOLN
2.0000 g | INTRAVENOUS | Status: AC
  Administered 2021-09-11 – 2021-09-15 (×5): 2 g via INTRAVENOUS
  Filled 2021-09-11: qty 2
  Filled 2021-09-11 (×2): qty 20
  Filled 2021-09-11: qty 2
  Filled 2021-09-11 (×2): qty 20

## 2021-09-11 MED ORDER — ACETAMINOPHEN 650 MG RE SUPP
650.0000 mg | Freq: Four times a day (QID) | RECTAL | Status: DC | PRN
  Filled 2021-09-11: qty 1

## 2021-09-11 MED ORDER — LACTATED RINGERS IV BOLUS
2000.0000 mL | Freq: Once | INTRAVENOUS | Status: AC
  Administered 2021-09-11: 2000 mL via INTRAVENOUS

## 2021-09-11 MED ORDER — ENOXAPARIN SODIUM 40 MG/0.4ML IJ SOSY
40.0000 mg | PREFILLED_SYRINGE | INTRAMUSCULAR | Status: DC
  Administered 2021-09-12 – 2021-09-16 (×6): 40 mg via SUBCUTANEOUS
  Filled 2021-09-11 (×6): qty 0.4

## 2021-09-11 MED ORDER — SODIUM CHLORIDE 0.9 % IV SOLN: 2.0000 g | INTRAVENOUS | Status: DC

## 2021-09-11 MED ORDER — ACETAMINOPHEN 325 MG PO TABS
650.0000 mg | ORAL_TABLET | Freq: Once | ORAL | Status: AC | PRN
  Administered 2021-09-11: 650 mg via ORAL
  Filled 2021-09-11: qty 2

## 2021-09-11 MED ORDER — IPRATROPIUM-ALBUTEROL 0.5-2.5 (3) MG/3ML IN SOLN: 3.0000 mL | Freq: Four times a day (QID) | RESPIRATORY_TRACT | Status: DC | PRN

## 2021-09-11 MED ORDER — ACETAMINOPHEN 325 MG PO TABS
650.0000 mg | ORAL_TABLET | Freq: Four times a day (QID) | ORAL | Status: DC | PRN
  Administered 2021-09-12 – 2021-09-16 (×5): 650 mg via ORAL
  Filled 2021-09-11 (×5): qty 2

## 2021-09-11 MED ORDER — ONDANSETRON HCL 4 MG/2ML IJ SOLN: 4.0000 mg | Freq: Four times a day (QID) | INTRAMUSCULAR | Status: DC | PRN

## 2021-09-11 MED ORDER — OSELTAMIVIR PHOSPHATE 75 MG PO CAPS
75.0000 mg | ORAL_CAPSULE | Freq: Two times a day (BID) | ORAL | Status: AC
  Administered 2021-09-12 – 2021-09-16 (×9): 75 mg via ORAL
  Filled 2021-09-11 (×11): qty 1

## 2021-09-11 MED ORDER — INSULIN REGULAR HUMAN 100 UNIT/ML IJ SOLN: 5.0000 [IU] | Freq: Once | INTRAMUSCULAR | Status: DC

## 2021-09-11 MED ORDER — INSULIN ASPART 100 UNIT/ML IJ SOLN: 0.0000 [IU] | Freq: Three times a day (TID) | INTRAMUSCULAR | Status: DC

## 2021-09-11 MED ORDER — KETOROLAC TROMETHAMINE 30 MG/ML IJ SOLN
15.0000 mg | INTRAMUSCULAR | Status: AC
  Administered 2021-09-11: 15 mg via INTRAVENOUS
  Filled 2021-09-11: qty 1

## 2021-09-11 MED ORDER — INSULIN ASPART 100 UNIT/ML IJ SOLN
5.0000 [IU] | Freq: Once | INTRAMUSCULAR | Status: AC
  Administered 2021-09-12: 5 [IU] via SUBCUTANEOUS
  Filled 2021-09-11: qty 1

## 2021-09-11 MED ORDER — LACTATED RINGERS IV SOLN: INTRAVENOUS | Status: DC

## 2021-09-11 MED ORDER — ONDANSETRON HCL 4 MG PO TABS: 4.0000 mg | ORAL_TABLET | Freq: Four times a day (QID) | ORAL | Status: DC | PRN

## 2021-09-11 MED ORDER — SODIUM CHLORIDE 0.9 % IV SOLN
500.0000 mg | INTRAVENOUS | Status: DC
  Administered 2021-09-11: 500 mg via INTRAVENOUS

## 2021-09-11 NOTE — ED Provider Notes (Signed)
Emergency Medicine Provider Triage Evaluation Note  Joseph [name] , a 50 y.o. male  was evaluated in triage.  Pt complains of chest pain and flu like symptoms.  Review of Systems  Positive: Chest pain with coughing Negative: syncope  Physical Exam  BP (!) 160/112   Pulse (!) 130   Temp (!) 103 F (39.4 C) (Oral)   Resp 20   Ht 6\' 1"  (1.854 m)   Wt 102.1 kg   SpO2 90%   BMI 29.69 kg/m  Gen:   Awake, no distress   Resp:  Normal effort  MSK:   Moves extremities without difficulty  Other:    Medical Decision Making  Medically screening exam initiated at 2:41 PM.  Appropriate orders placed.  [name] was informed that the remainder of the evaluation will be completed by another provider, this initial triage assessment does not replace that evaluation, and the importance of remaining in the ED until their evaluation is complete.     Vladimir Crofts, MD 09/11/21 [phone]

## 2021-09-11 NOTE — ED Provider Notes (Signed)
[facility] Emergency Department Provider Note  ____________________________________________  Time seen: Approximately 10:02 PM  I have reviewed the triage vital signs and the nursing notes.   HISTORY  Chief Complaint Influenza and Chest Pain    HPI [name] is a 50 y.o. male with a history of CHF diabetes hypertension who comes to the ED complaining of shortness of breath, productive cough, body aches, fatigue for the past week.  He has had no associated fever.  He has not been able to eat or drink anything today due to nausea.  Symptoms are constant, waxing and waning, no aggravating or alleviating factors    Past Medical History:  Diagnosis Date   AICD (automatic cardioverter/defibrillator) present 2011   Asthma    Cancer ([place])    prostate   CHF (congestive heart failure) ([place])    Coronary artery disease    Diabetes mellitus without complication ([place])    Dyspnea    ED (erectile dysfunction)    GERD (gastroesophageal reflux disease)    Hyperlipidemia    Hypertension    Ischemic cardiomyopathy    LADA (latent autoimmune diabetes in adults), managed as type 1 ([place])    Myocardial infarction ([place]) 2011   anterior MI at [place] s/p BMS to ostial/proximal LAD   Neuromuscular disorder ([place])    problems in arms. finger tips have been numb/tingling. voltaren works   Obstructive sleep apnea 2018   cpap. has not used lately. uses a breathe-right strip on nose   Seizures ([place])    diabetes seizures (low blood sugar); last one 32/5498   Systolic heart failure ([place])    Vitamin D deficiency      Patient Active Problem List   Diagnosis Date Noted   Acute respiratory failure with hypoxia ([place]) 09/11/2021   Influenza A 09/11/2021   AKI (acute kidney injury) ([place]) 09/11/2021   Elevated troponin 09/11/2021   Pneumonia due to COVID-19 virus 09/19/2020   Diabetes mellitus without complication ([place])    Hyperglycemia due to type 1 diabetes mellitus ([place])  07/17/2020   Diabetic foot ulcer associated with type 1 diabetes mellitus ([place]) 07/17/2020   Cellulitis 07/14/2020   Severe sepsis ([place]) 07/14/2020   Cystitis    Scrotal pain    CAD (coronary artery disease) 01/12/2020   S/P prostatectomy 01/12/2020   Severe asthma without complication 26/41/5830   UTI (urinary tract infection) 01/12/2020   Possible Postprocedural intraabdominal abscess 01/12/2020   Prostate cancer ([place]) 01/03/2020   Preop cardiovascular exam 10/18/2019   Abdominal bloating 04/12/2019   Chronic diarrhea 04/12/2019   Gastritis and duodenitis 04/12/2019   Dyskinesia of gallbladder 11/07/2017   Elevated PSA 07/25/2017   Erectile dysfunction associated with type 2 diabetes mellitus ([place]) 07/25/2017   Hypertension 03/01/2017   Obstructive sleep apnea on CPAP 94/04/6807   Chronic systolic CHF (congestive heart failure) ([place]) 11/24/2016   Shortness of breath    Abnormal EKG    DKA (diabetic ketoacidoses) 04/18/2016   Multifocal pneumonia 04/18/2016   Accelerated hypertension 04/18/2016   Abdominal pain 04/18/2016   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[place]) 08/19/2015   Poorly controlled type 1 diabetes mellitus ([place]) 08/07/2015   Required emergent intubation 03/07/2014   LADA (latent autoimmune diabetes in adults), managed as type 1 ([place]) 09/15/2011   GERD without esophagitis 08/18/2011   Hyperlipemia 08/18/2011   Implantable cardioverter-defibrillator (ICD) in situ 08/18/2011   Ischemic cardiomyopathy 08/18/2011   MI (myocardial infarction) ([place]) 03/11/2010     Past Surgical History:  Procedure Laterality  Date   CARDIAC DEFIBRILLATOR PLACEMENT  08/30/2010   CHOLECYSTECTOMY N/A 11/07/2017   Procedure: LAPAROSCOPIC CHOLECYSTECTOMY;  Surgeon: Herbert Pun, MD;  Location: [facility] ORS;  Service: General;  Laterality: N/A;   CORONARY ANGIOPLASTY  2011   stent placed x 1   CYSTOSCOPY N/A 01/03/2020   Procedure: CYSTOSCOPY FLEXIBLE;  Surgeon: Hollice Espy, MD;  Location: [facility]  ORS;  Service: Urology;  Laterality: N/A;   ESOPHAGOGASTRODUODENOSCOPY (EGD) WITH PROPOFOL N/A 03/20/2018   Procedure: ESOPHAGOGASTRODUODENOSCOPY (EGD) WITH PROPOFOL;  Surgeon: Manya Silvas, MD;  Location: [facility] ENDOSCOPY;  Service: Endoscopy;  Laterality: N/A;   LEFT HEART CATH AND CORONARY ANGIOGRAPHY N/A 11/23/2016   Procedure: Left Heart Cath and Coronary Angiography;  Surgeon: Nelva Bush, MD;  Location: [facility] CV LAB;  Service: Cardiovascular;  Laterality: N/A;   PROSTATE BIOPSY  2019   PROSTATE BIOPSY N/A 10/23/2019   Procedure: PROSTATE BIOPSY;  Surgeon: Abbie Sons, MD;  Location: [facility] ORS;  Service: Urology;  Laterality: N/A;   ROBOT ASSISTED LAPAROSCOPIC RADICAL PROSTATECTOMY N/A 01/03/2020   Procedure: XI ROBOTIC ASSISTED LAPAROSCOPIC RADICAL PROSTATECTOMY;  Surgeon: Hollice Espy, MD;  Location: [facility] ORS;  Service: Urology;  Laterality: N/A;   TRANSRECTAL ULTRASOUND N/A 10/23/2019   Procedure: TRANSRECTAL ULTRASOUND;  Surgeon: Abbie Sons, MD;  Location: [facility] ORS;  Service: Urology;  Laterality: N/A;   WRIST SURGERY Right 2008     Prior to Admission medications   Medication Sig Start Date End Date Taking? Authorizing Provider  acetaminophen (TYLENOL) 500 MG tablet Take 500 mg by mouth every 6 (six) hours as needed for fever.   Yes [provider]  albuterol (PROVENTIL HFA) 108 (90 Base) MCG/ACT inhaler Inhale 2 puffs into the lungs every 4 (four) hours as needed for wheezing or shortness of breath. 10/08/18  Yes Carrie Mew, MD  aspirin EC 81 MG tablet Take 81 mg by mouth every morning.   Yes [provider]  atorvastatin (LIPITOR) 40 MG tablet Take 40 mg by mouth daily. 06/08/20  Yes [provider]  carvedilol (COREG) 12.5 MG tablet Take 12.5 mg by mouth daily. 05/28/20  Yes [provider]  Cholecalciferol 25 MCG (1000 UT) capsule Take 1,000 Units by mouth daily.   Yes [provider]  dicyclomine (BENTYL)  10 MG capsule Take 1 capsule (10 mg total) by mouth 4 (four) times daily -  before meals and at bedtime for 5 days. 09/05/21 09/11/21 Yes Lucrezia Starch, MD  digoxin (LANOXIN) 0.125 MG tablet Take 1 tablet (125 mcg total) by mouth daily. 01/15/20  Yes Lorella Nimrod, MD  fluticasone (FLONASE) 50 MCG/ACT nasal spray Place 2 sprays into both nostrils daily as needed for allergies.  01/26/16  Yes [provider]  gentamicin cream (GARAMYCIN) 0.1 % Apply 1 application topically 2 (two) times daily. 12/26/20  Yes Edrick Kins, DPM  glucagon, human recombinant, (GLUCAGEN) 1 MG injection Inject into the muscle. 02/07/18  Yes [provider]  insulin aspart (NOVOLOG) 100 UNIT/ML FlexPen Inject 18-20-22 u TID AC plus sliding scale for up to 75 units TDD 01/10/20  Yes [provider]  insulin degludec (TRESIBA FLEXTOUCH) 200 UNIT/ML FlexTouch Pen Inject 50 Units into the skin daily.  01/29/20  Yes [provider]  Insulin Pen Needle (B-D ULTRAFINE III SHORT PEN) 31G X 8 MM MISC USE 5 TIMES DAILY AS  DIRECTED 12/01/19  Yes Carrie Mew, MD  loratadine (CLARITIN) 10 MG tablet Take 10 mg by mouth daily as needed.  Yes [provider]  naproxen sodium (ALEVE) 220 MG tablet Take 220 mg by mouth daily as needed.   Yes [provider]  ondansetron (ZOFRAN) 4 MG tablet Take 1 tablet (4 mg total) by mouth every 8 (eight) hours as needed for up to 10 doses for nausea or vomiting. 09/05/21  Yes Lucrezia Starch, MD  potassium chloride (KLOR-CON) 10 MEQ tablet Take 10 mEq by mouth daily. 04/01/20  Yes [provider]  sacubitril-valsartan (ENTRESTO) 97-103 MG Take 1 tablet by mouth 2 (two) times daily. 05/13/21  Yes Darylene Price A, FNP  spironolactone (ALDACTONE) 25 MG tablet Take 0.5 tablets (12.5 mg total) by mouth daily. 01/15/20  Yes Lorella Nimrod, MD  SYMBICORT 160-4.5 MCG/ACT inhaler Inhale 2 puffs into the lungs 2 (two) times daily. 05/27/20  Yes [provider]  torsemide (DEMADEX) 20 MG tablet Take 2 tablets (40 mg total) by mouth daily. 08/06/21 11/04/21 Yes Darylene Price A, FNP     Allergies Vancomycin and Lisinopril   Family History  Problem Relation Age of Onset   Hypertension Mother    Heart attack Paternal Grandmother    Heart attack Father    Bladder Cancer Neg Hx    Kidney cancer Neg Hx    Prostate cancer Neg Hx     Social History Social History   Tobacco Use   Smoking status: Never   Smokeless tobacco: Never  Vaping Use   Vaping Use: Never used  Substance Use Topics   Alcohol use: No   Drug use: No    Review of Systems  Constitutional:   Positive fever chills.  ENT:   No sore throat. No rhinorrhea. Cardiovascular:   No chest pain or syncope. Respiratory:   Positive shortness of breath and cough. Gastrointestinal:   Negative for abdominal pain, positive vomiting and diarrhea.  Musculoskeletal:   Negative for focal pain or swelling All other systems reviewed and are negative except as documented above in ROS and HPI.  ____________________________________________   PHYSICAL EXAM:  VITAL SIGNS: ED Triage Vitals  Enc Vitals Group     BP 09/11/21 1422 (!) 160/112     Pulse Rate 09/11/21 1422 (!) 130     Resp 09/11/21 1422 20     Temp 09/11/21 1422 (!) 103 F (39.4 C)     Temp Source 09/11/21 1422 Oral     SpO2 09/11/21 1422 90 %     Weight 09/11/21 1423 225 lb (102.1 kg)     Height 09/11/21 1423 6\' 1"  (1.854 m)     Head Circumference --      Peak Flow --      Pain Score 09/11/21 1423 9     Pain Loc --      Pain Edu? --      Excl. in [place]? --     Vital signs reviewed, nursing assessments reviewed.   Constitutional:   Alert and oriented.  Ill-appearing. Eyes:   Conjunctivae are normal. EOMI. PERRL. ENT      Head:   Normocephalic and atraumatic.      Nose:   Normal      Mouth/Throat:   Dry mucous membranes.      Neck:   No meningismus. Full ROM. Hematological/Lymphatic/Immunilogical:    No cervical lymphadenopathy. Cardiovascular:   Tachycardia heart rate 130. Symmetric bilateral radial and DP pulses.  No murmurs. Cap refill less than 2 seconds. Respiratory:   Tachypnea with respiratory rate of 22.  Multifocal crackles.  No wheezing.  Oxygen saturation 87% on room air Gastrointestinal:   Soft and nontender. Non distended. There is no CVA tenderness.  No rebound, rigidity, or guarding. Genitourinary:   deferred Musculoskeletal:   Normal range of motion in all extremities. No joint effusions.  No lower extremity tenderness.  No edema. Neurologic:   Normal speech and language.  Motor grossly intact. No acute focal neurologic deficits are appreciated.  Skin:    Skin is warm, dry and intact. No rash noted.  No petechiae, purpura, or bullae.  ____________________________________________    LABS (pertinent positives/negatives) (all labs ordered are listed, but only abnormal results are displayed) Labs Reviewed  RESP PANEL BY RT-PCR (FLU A&B, COVID) ARPGX2 - Abnormal; Notable for the following components:      Result Value   Influenza A by PCR POSITIVE (*)    All other components within normal limits  BASIC METABOLIC PANEL - Abnormal; Notable for the following components:   Sodium 125 (*)    Chloride 89 (*)    Glucose, Bld 535 (*)    BUN 50 (*)    Creatinine, Ser 1.66 (*)    Calcium 8.2 (*)    GFR, Estimated 50 (*)    All other components within normal limits  CBC - Abnormal; Notable for the following components:   WBC 18.9 (*)    All other components within normal limits  LACTIC ACID, PLASMA - Abnormal; Notable for the following components:   Lactic Acid, Venous 2.1 (*)    All other components within normal limits  CREATININE, SERUM - Abnormal; Notable for the following components:   Creatinine, Ser 2.76 (*)    GFR, Estimated 27 (*)    All other components within normal limits  CBG MONITORING, ED - Abnormal; Notable for the following components:    Glucose-Capillary 544 (*)    All other components within normal limits  TROPONIN I (HIGH SENSITIVITY) - Abnormal; Notable for the following components:   Troponin I (High Sensitivity) 26 (*)    All other components within normal limits  TROPONIN I (HIGH SENSITIVITY) - Abnormal; Notable for the following components:   Troponin I (High Sensitivity) 22 (*)    All other components within normal limits  CULTURE, BLOOD (ROUTINE X 2)  CULTURE, BLOOD (ROUTINE X 2)  LACTIC ACID, PLASMA  PROCALCITONIN  HEMOGLOBIN A1C  PROTIME-INR  CORTISOL-AM, BLOOD  PROCALCITONIN  HIV ANTIBODY (ROUTINE TESTING W REFLEX)   ____________________________________________   EKG Interpreted by me Sinus tachycardia rate 130.  Right axis, normal intervals.  Poor R wave progression.  Normal ST segments and T waves.   ____________________________________________    [id number]  DG Chest 2 View  Result Date: 09/11/2021 CLINICAL DATA:  Chest pain EXAM: CHEST - 2 VIEW COMPARISON:  Chest x-ray 09/05/2021 FINDINGS: Heart size is normal. Left-sided cardiac device appears unchanged. Prominent peribronchial opacities seen throughout both lungs, upper lobe predominant. Airspace consolidation in the left suprahilar region which is new since previous study. Smaller patchy airspace opacities in the left lower lung zone also appear new since previous. No pleural effusion or pneumothorax visualized. IMPRESSION: Findings suggestive of multifocal pneumonia and interstitial pneumonitis. Electronically Signed   By: Ofilia Neas M.D.   On: 09/11/2021 15:03    ____________________________________________   PROCEDURES .Critical Care Performed by: Carrie Mew, MD Authorized by: Carrie Mew, MD   Critical care provider statement:    Critical care time (minutes):  35   Critical care time was exclusive of:  Separately billable  procedures and treating other patients   Critical care was necessary to treat or prevent  imminent or life-threatening deterioration of the following conditions:  Sepsis, respiratory failure and dehydration   Critical care was time spent personally by me on the following activities:  Development of treatment plan with patient or surrogate, discussions with consultants, evaluation of patient's response to treatment, examination of patient, obtaining history from patient or surrogate, ordering and performing treatments and interventions, ordering and review of laboratory studies, ordering and review of radiographic studies, pulse oximetry, re-evaluation of patient's condition and review of old charts  ____________________________________________  DIFFERENTIAL DIAGNOSIS   Viral illness, pneumonia, pulmonary edema, pleural effusion  CLINICAL IMPRESSION / ASSESSMENT AND PLAN / ED COURSE  Medications ordered in the ED: Medications  cefTRIAXone (ROCEPHIN) 2 g in sodium chloride 0.9 % 100 mL IVPB (0 g Intravenous Stopped 09/11/21 2205)  azithromycin (ZITHROMAX) 500 mg in sodium chloride 0.9 % 250 mL IVPB (500 mg Intravenous Not Given 09/11/21 2203)  oseltamivir (TAMIFLU) capsule 75 mg (has no administration in time range)  insulin aspart (novoLOG) injection 0-15 Units (has no administration in time range)  insulin aspart (novoLOG) injection 0-5 Units (has no administration in time range)  enoxaparin (LOVENOX) injection 40 mg (has no administration in time range)  lactated ringers infusion (has no administration in time range)  acetaminophen (TYLENOL) tablet 650 mg (has no administration in time range)    Or  acetaminophen (TYLENOL) suppository 650 mg (has no administration in time range)  ondansetron (ZOFRAN) tablet 4 mg (has no administration in time range)    Or  ondansetron (ZOFRAN) injection 4 mg (has no administration in time range)  guaiFENesin-dextromethorphan (ROBITUSSIN DM) 100-10 MG/5ML syrup 5 mL (has no administration in time range)  ipratropium-albuterol (DUONEB) 0.5-2.5 (3)  MG/3ML nebulizer solution 3 mL (has no administration in time range)  insulin regular (NOVOLIN R) 100 units/mL injection 5 Units (has no administration in time range)  acetaminophen (TYLENOL) tablet 650 mg (650 mg Oral Given 09/11/21 1431)  lactated ringers bolus 2,000 mL (2,000 mLs Intravenous New Bag/Given 09/11/21 2118)  ondansetron (ZOFRAN) injection 4 mg (4 mg Intravenous Given 09/11/21 2119)  ketorolac (TORADOL) 30 MG/ML injection 15 mg (15 mg Intravenous Given 09/11/21 2119)    Pertinent labs & imaging results that were available during my care of the patient were reviewed by me and considered in my medical decision making (see chart for details).  Joseph [name] was evaluated in Emergency Department on 09/11/2021 for the symptoms described in the history of present illness. He was evaluated in the context of the global COVID-19 pandemic, which necessitated consideration that the patient might be at risk for infection with the SARS-CoV-2 virus that causes COVID-19. Institutional protocols and algorithms that pertain to the evaluation of patients at risk for COVID-19 are in a state of rapid change based on information released by regulatory bodies including the CDC and federal and state organizations. These policies and algorithms were followed during the patient's care in the ED.     Clinical Course as of 09/11/21 2355  Fri Sep 11, 2021  2201 Pt p/w flu sx x 1 week and hypoxic resp failure from likely superimposed pneumonia. Pt is septic without shock.  [PS]    Clinical Course User Index [PS] Carrie Mew, MD     ----------------------------------------- 11:57 PM on 09/11/2021 ----------------------------------------- Labs showed influenza A positive, procalcitonin significantly elevated and swelling.  Currently has hyperglycemia, glucose of 500, sodium 125, leukocytosis with white  count of 19,000, chest x-ray demonstrating multifocal pneumonia.  Presentation is consistent with  influenza over the past week with superimposed bacterial pneumonia.  Azithromycin and ceftriaxone ordered, IV fluids for hyperglycemia and sepsis.  On reassessment his tachycardia is improving.  Lactate and blood pressure are normal, no signs of septic shock.  ____________________________________________   FINAL CLINICAL IMPRESSION(S) / ED DIAGNOSES    Final diagnoses:  Sepsis with acute hypoxic respiratory failure without septic shock, due to unspecified organism ([place])  Influenza A  Type 2 diabetes mellitus with hyperglycemia, unspecified whether long term insulin use [facility])     ED Discharge Orders     None       Portions of this note were generated with dragon dictation software. Dictation errors may occur despite best attempts at proofreading.    Carrie Mew, MD 09/11/21 [phone]

## 2021-09-11 NOTE — H&P (Signed)
History and Physical    Joseph [name] ZDG:[id number] DOB: [birth year] DOA: 09/11/2021  PCP: [facility], [facility]   Patient coming from: home  I have personally briefly reviewed patient's relevant medical records in [place]  Chief Complaint: flu like symptoms, malaise  HPI: Joseph [name] is a 50 y.o. male with medical history significant for Type I DM, HTN, CAD with history of MI, ischemic cardiomyopathy, systolic CHF s/p AICD, HTN, prostate cancer s/p prostatectomy 12/2019 who presents to the ED with a 1 week history of flulike symptoms with cough, generalized malaise, muscle aches and fever with worsening symptoms over the past couple days with cough productive of thick yellow phlegm.  He denies chest pain.  Denies nausea, vomiting, abdominal pain or diarrhea  ED course: T-max 103, tachycardic to 130 and tachypneic to 20, BP 160/112.  Hypoxic to 87% in the ED requiring 2 L to maintain sats in the mid 90s Blood work with WBC 19,000, procalcitonin 8.87 creatinine 1.66 above baseline of 0.86.  Blood sugar 535 Troponin 26-22  Influenza A+  EKG: Sinus tachycardia at 130 with nonspecific ST-T wave changes  Imaging: Chest x-ray suggestive of multifocal pneumonia and interstitial pneumonitis  Patient started on azithromycin and ceftriaxone.  Hospitalist consulted for admission.   Review of Systems: As per HPI otherwise all other systems on review of systems negative.   Assessment/Plan    Influenza A with superimposed bacterial infection pneumonia    Severe sepsis (HCC) -Sepsis criteria includes fever, tachycardia, leukocytosis, lactic acid 2.1 hypoxia, AKI  - Tamiflu, Rocephin and azithromycin - Sepsis fluids with close monitoring for fluid overload given systolic heart failure - Symptomatic treatment - Supplemental oxygen to keep sats over 94  Acute respiratory failure with hypoxia - O2 sats 87% on room air with increased work of breathing - Supplemental oxygen to  keep sats over 59%  Chronic systolic CHF  Ischemic cardiomyopathy S/p AICD - Euvolemic but high risk of fluid overload with sepsis fluids - Daily weights - Continue CHF meds, carvedilol, torsemide, digoxin and Entresto pending verification    Hyperglycemia due to type 1 diabetes mellitus (HCC) - Blood sugar 539 - Sliding scale insulin coverage    AKI (acute kidney injury) (HCC) - Creatinine 1.66 about 0.86 - IV hydration as above and monitor  Elevated troponin CAD with history of MI - Troponin showing non-ACS trend and EKG nonacute - Continue to monitor - Continue carvedilol, atorvastatin and aspirin    Prostate cancer s/p prostatectomy - No acute issues    Obstructive sleep apnea on CPAP - CPAP if desired  DVT prophylaxis: Lovenox  Code Status: full code  Family Communication: Wife at bedside Disposition Plan: Back to previous home environment Consults called: none  Status:At the time of admission, it appears that the appropriate admission status for this patient is INPATIENT. This is judged to be reasonable and necessary in order to provide the required intensity of service to ensure the patient's safety given the presenting symptoms, physical exam findings, and initial radiographic and laboratory data in the context of their  Comorbid conditions.   Patient requires inpatient status due to high intensity of service, high risk for further deterioration and high frequency of surveillance required.   I certify that at the point of admission it is my clinical judgment that the patient will require inpatient hospital care spanning beyond 2 midnights     Physical Exam: Vitals:   09/11/21 1422 09/11/21 1423 09/11/21 2030 09/11/21 2100  BP: (!) 160/112  140/77 (!) 151/74  Pulse: (!) 130  (!) 119 (!) 118  Resp: 20   18  Temp: (!) 103 F (39.4 C)     TempSrc: Oral     SpO2: 90%  (!) 87% 98%  Weight:  102.1 kg    Height:  6\' 1"  (1.854 m)     Constitutional: Ill, toxic  appearing, oriented x 3 .  Mild respiratory distress HEENT:      Head: Normocephalic and atraumatic.         Eyes: PERLA, EOMI, Conjunctivae are normal. Sclera is non-icteric.       Mouth/Throat: Mucous membranes are moist.       Neck: Supple with no signs of meningismus. Cardiovascular: Regular rate and rhythm. No murmurs, gallops, or rubs. 2+ symmetrical distal pulses are present . No JVD. No  LE edema Respiratory: Respiratory effort increased, tachypneic with coarse breath sounds bilaterally.  Speaking in short sentences gastrointestinal: Soft, non tender, non distended. Positive bowel sounds.  Genitourinary: No CVA tenderness. Musculoskeletal: Nontender with normal range of motion in all extremities. No cyanosis, or erythema of extremities. Neurologic:  Face is symmetric. Moving all extremities. No gross focal neurologic deficits . Skin: Skin is warm, dry.  No rash or ulcers Psychiatric: Mood and affect are appropriate     Past Medical History:  Diagnosis Date   AICD (automatic cardioverter/defibrillator) present 2011   Asthma    Cancer ([place])    prostate   CHF (congestive heart failure) ([place])    Coronary artery disease    Diabetes mellitus without complication ([place])    Dyspnea    ED (erectile dysfunction)    GERD (gastroesophageal reflux disease)    Hyperlipidemia    Hypertension    Ischemic cardiomyopathy    LADA (latent autoimmune diabetes in adults), managed as type 1 ([place])    Myocardial infarction ([place]) 2011   anterior MI at [place] s/p BMS to ostial/proximal LAD   Neuromuscular disorder ([place])    problems in arms. finger tips have been numb/tingling. voltaren works   Obstructive sleep apnea 2018   cpap. has not used lately. uses a breathe-right strip on nose   Seizures ([place])    diabetes seizures (low blood sugar); last one 56/4332   Systolic heart failure ([place])    Vitamin D deficiency     Past Surgical History:  Procedure Laterality Date   CARDIAC DEFIBRILLATOR  PLACEMENT  08/30/2010   CHOLECYSTECTOMY N/A 11/07/2017   Procedure: LAPAROSCOPIC CHOLECYSTECTOMY;  Surgeon: Herbert Pun, MD;  Location: [facility] ORS;  Service: General;  Laterality: N/A;   CORONARY ANGIOPLASTY  2011   stent placed x 1   CYSTOSCOPY N/A 01/03/2020   Procedure: CYSTOSCOPY FLEXIBLE;  Surgeon: Hollice Espy, MD;  Location: [facility] ORS;  Service: Urology;  Laterality: N/A;   ESOPHAGOGASTRODUODENOSCOPY (EGD) WITH PROPOFOL N/A 03/20/2018   Procedure: ESOPHAGOGASTRODUODENOSCOPY (EGD) WITH PROPOFOL;  Surgeon: Manya Silvas, MD;  Location: [facility] ENDOSCOPY;  Service: Endoscopy;  Laterality: N/A;   LEFT HEART CATH AND CORONARY ANGIOGRAPHY N/A 11/23/2016   Procedure: Left Heart Cath and Coronary Angiography;  Surgeon: Nelva Bush, MD;  Location: [place] CV LAB;  Service: Cardiovascular;  Laterality: N/A;   PROSTATE BIOPSY  2019   PROSTATE BIOPSY N/A 10/23/2019   Procedure: PROSTATE BIOPSY;  Surgeon: Abbie Sons, MD;  Location: [facility] ORS;  Service: Urology;  Laterality: N/A;   ROBOT ASSISTED LAPAROSCOPIC RADICAL PROSTATECTOMY N/A 01/03/2020   Procedure: XI ROBOTIC ASSISTED LAPAROSCOPIC RADICAL PROSTATECTOMY;  Surgeon: Hollice Espy, MD;  Location: [facility] ORS;  Service: Urology;  Laterality: N/A;   TRANSRECTAL ULTRASOUND N/A 10/23/2019   Procedure: TRANSRECTAL ULTRASOUND;  Surgeon: Abbie Sons, MD;  Location: [facility] ORS;  Service: Urology;  Laterality: N/A;   WRIST SURGERY Right 2008     reports that he has never smoked. He has never used smokeless tobacco. He reports that he does not drink alcohol and does not use drugs.  Allergies  Allergen Reactions   Vancomycin Shortness Of Breath   Lisinopril Rash    Family History  Problem Relation Age of Onset   Hypertension Mother    Heart attack Paternal Grandmother    Heart attack Father    Bladder Cancer Neg Hx    Kidney cancer Neg Hx    Prostate cancer Neg Hx      Prior to Admission medications   Medication Sig  Start Date End Date Taking? Authorizing Provider  acetaminophen (TYLENOL) 500 MG tablet Take 500 mg by mouth every 6 (six) hours as needed for fever.   Yes [provider]  albuterol (PROVENTIL HFA) 108 (90 Base) MCG/ACT inhaler Inhale 2 puffs into the lungs every 4 (four) hours as needed for wheezing or shortness of breath. 10/08/18  Yes Carrie Mew, MD  aspirin EC 81 MG tablet Take 81 mg by mouth every morning.   Yes [provider]  atorvastatin (LIPITOR) 40 MG tablet Take 40 mg by mouth daily. 06/08/20  Yes [provider]  carvedilol (COREG) 12.5 MG tablet Take 12.5 mg by mouth daily. 05/28/20  Yes [provider]  Cholecalciferol 25 MCG (1000 UT) capsule Take 1,000 Units by mouth daily.   Yes [provider]  dicyclomine (BENTYL) 10 MG capsule Take 1 capsule (10 mg total) by mouth 4 (four) times daily -  before meals and at bedtime for 5 days. 09/05/21 09/11/21 Yes Lucrezia Starch, MD  digoxin (LANOXIN) 0.125 MG tablet Take 1 tablet (125 mcg total) by mouth daily. 01/15/20  Yes Lorella Nimrod, MD  fluticasone (FLONASE) 50 MCG/ACT nasal spray Place 2 sprays into both nostrils daily as needed for allergies.  01/26/16  Yes [provider]  gentamicin cream (GARAMYCIN) 0.1 % Apply 1 application topically 2 (two) times daily. 12/26/20  Yes Edrick Kins, DPM  glucagon, human recombinant, (GLUCAGEN) 1 MG injection Inject into the muscle. 02/07/18  Yes [provider]  insulin aspart (NOVOLOG) 100 UNIT/ML FlexPen Inject 18-20-22 u TID AC plus sliding scale for up to 75 units TDD 01/10/20  Yes [provider]  insulin degludec (TRESIBA FLEXTOUCH) 200 UNIT/ML FlexTouch Pen Inject 50 Units into the skin daily.  01/29/20  Yes [provider]  Insulin Pen Needle (B-D ULTRAFINE III SHORT PEN) 31G X 8 MM MISC USE 5 TIMES DAILY AS  DIRECTED 12/01/19  Yes Carrie Mew, MD  loratadine (CLARITIN) 10 MG tablet Take 10 mg by mouth  daily as needed.    Yes [provider]  naproxen sodium (ALEVE) 220 MG tablet Take 220 mg by mouth daily as needed.   Yes [provider]  ondansetron (ZOFRAN) 4 MG tablet Take 1 tablet (4 mg total) by mouth every 8 (eight) hours as needed for up to 10 doses for nausea or vomiting. 09/05/21  Yes Lucrezia Starch, MD  potassium chloride (KLOR-CON) 10 MEQ tablet Take 10 mEq by mouth daily. 04/01/20  Yes [provider]  sacubitril-valsartan (ENTRESTO) 97-103 MG Take 1 tablet by mouth 2 (two)  times daily. 05/13/21  Yes Darylene Price A, FNP  spironolactone (ALDACTONE) 25 MG tablet Take 0.5 tablets (12.5 mg total) by mouth daily. 01/15/20  Yes Lorella Nimrod, MD  SYMBICORT 160-4.5 MCG/ACT inhaler Inhale 2 puffs into the lungs 2 (two) times daily. 05/27/20  Yes [provider]  torsemide (DEMADEX) 20 MG tablet Take 2 tablets (40 mg total) by mouth daily. 08/06/21 11/04/21 Yes Alisa Graff, FNP      Labs on Admission: I have personally reviewed following labs and imaging studies  CBC: Recent Labs  Lab 09/05/21 0715 09/11/21 1432  WBC 6.3 18.9*  HGB 15.8 14.2  HCT 46.2 40.6  MCV 89.4 87.7  PLT 174 594   Basic Metabolic Panel: Recent Labs  Lab 09/05/21 0715 09/11/21 1432  NA 133* 125*  K 3.7 4.2  CL 100 89*  CO2 24 22  GLUCOSE 217* 535*  BUN 31* 50*  CREATININE 1.38* 1.66*  CALCIUM 8.5* 8.2*   GFR: Estimated Creatinine Clearance: 66.9 mL/min (A) (by C-G formula based on SCr of 1.66 mg/dL (H)). Liver Function Tests: Recent Labs  Lab 09/05/21 0715  AST 29  ALT 28  ALKPHOS 125  BILITOT 1.1  PROT 7.8  ALBUMIN 3.7   Recent Labs  Lab 09/05/21 0715  LIPASE 21   No results for input(s): AMMONIA in the last 168 hours. Coagulation Profile: No results for input(s): INR, PROTIME in the last 168 hours. Cardiac Enzymes: No results for input(s): CKTOTAL, CKMB, CKMBINDEX, TROPONINI in the last 168 hours. BNP (last 3 results) No results for  input(s): PROBNP in the last 8760 hours. HbA1C: No results for input(s): HGBA1C in the last 72 hours. CBG: No results for input(s): GLUCAP in the last 168 hours. Lipid Profile: No results for input(s): CHOL, HDL, LDLCALC, TRIG, CHOLHDL, LDLDIRECT in the last 72 hours. Thyroid Function Tests: No results for input(s): TSH, T4TOTAL, FREET4, T3FREE, THYROIDAB in the last 72 hours. Anemia Panel: No results for input(s): VITAMINB12, FOLATE, FERRITIN, TIBC, IRON, RETICCTPCT in the last 72 hours. Urine analysis:    Component Value Date/Time   COLORURINE AMBER (A) 09/19/2020 1012   APPEARANCEUR HAZY (A) 09/19/2020 1012   APPEARANCEUR Cloudy (A) 02/01/2020 1429   LABSPEC 1.031 (H) 09/19/2020 1012   PHURINE 5.0 09/19/2020 1012   GLUCOSEU >=500 (A) 09/19/2020 1012   HGBUR SMALL (A) 09/19/2020 1012   BILIRUBINUR NEGATIVE 09/19/2020 1012   BILIRUBINUR Negative 02/01/2020 1429   KETONESUR 80 (A) 09/19/2020 1012   PROTEINUR >=300 (A) 09/19/2020 1012   NITRITE NEGATIVE 09/19/2020 1012   LEUKOCYTESUR NEGATIVE 09/19/2020 1012    Radiological Exams on Admission: DG Chest 2 View  Result Date: 09/11/2021 CLINICAL DATA:  Chest pain EXAM: CHEST - 2 VIEW COMPARISON:  Chest x-ray 09/05/2021 FINDINGS: Heart size is normal. Left-sided cardiac device appears unchanged. Prominent peribronchial opacities seen throughout both lungs, upper lobe predominant. Airspace consolidation in the left suprahilar region which is new since previous study. Smaller patchy airspace opacities in the left lower lung zone also appear new since previous. No pleural effusion or pneumothorax visualized. IMPRESSION: Findings suggestive of multifocal pneumonia and interstitial pneumonitis. Electronically Signed   By: Ofilia Neas M.D.   On: 09/11/2021 15:03       Athena Masse MD [facility]   09/11/2021, 9:52 PM

## 2021-09-11 NOTE — ED Triage Notes (Signed)
Flu symptoms.  Also chest pain, especially with deep breath or cough

## 2021-09-12 LAB — BASIC METABOLIC PANEL
Anion gap: 14 (ref 5–15)
BUN: 69 mg/dL — ABNORMAL HIGH (ref 6–20)
CO2: 22 mmol/L (ref 22–32)
Calcium: 7.9 mg/dL — ABNORMAL LOW (ref 8.9–10.3)
Chloride: 91 mmol/L — ABNORMAL LOW (ref 98–111)
Creatinine, Ser: 2.63 mg/dL — ABNORMAL HIGH (ref 0.61–1.24)
GFR, Estimated: 29 mL/min — ABNORMAL LOW (ref >60)
Glucose, Bld: 532 mg/dL (ref 70–99)
Potassium: 4.3 mmol/L (ref 3.5–5.1)
Sodium: 127 mmol/L — ABNORMAL LOW (ref 135–145)

## 2021-09-12 LAB — CBG MONITORING, ED
Glucose-Capillary: 554 mg/dL (ref 70–99)
Glucose-Capillary: 501 mg/dL (ref 70–99)
Glucose-Capillary: 568 mg/dL (ref 70–99)
Glucose-Capillary: 516 mg/dL (ref 70–99)
Glucose-Capillary: 482 mg/dL — ABNORMAL HIGH (ref 70–99)
Glucose-Capillary: 485 mg/dL — ABNORMAL HIGH (ref 70–99)
Glucose-Capillary: 370 mg/dL — ABNORMAL HIGH (ref 70–99)
Glucose-Capillary: 368 mg/dL — ABNORMAL HIGH (ref 70–99)
Glucose-Capillary: 381 mg/dL — ABNORMAL HIGH (ref 70–99)
Glucose-Capillary: 274 mg/dL — ABNORMAL HIGH (ref 70–99)
Glucose-Capillary: 247 mg/dL — ABNORMAL HIGH (ref 70–99)
Glucose-Capillary: 267 mg/dL — ABNORMAL HIGH (ref 70–99)
Glucose-Capillary: 248 mg/dL — ABNORMAL HIGH (ref 70–99)
Glucose-Capillary: 230 mg/dL — ABNORMAL HIGH (ref 70–99)

## 2021-09-12 LAB — CBC WITH DIFFERENTIAL/PLATELET
Abs Immature Granulocytes: 0.28 10*3/uL — ABNORMAL HIGH (ref 0.00–0.07)
Basophils Absolute: 0.1 10*3/uL (ref 0.0–0.1)
Basophils Relative: 1 %
Eosinophils Absolute: 0.1 10*3/uL (ref 0.0–0.5)
Eosinophils Relative: 0 %
HCT: 37.9 % — ABNORMAL LOW (ref 39.0–52.0)
Hemoglobin: 13.3 g/dL (ref 13.0–17.0)
Immature Granulocytes: 1 %
Lymphocytes Relative: 4 %
Lymphs Abs: 0.8 10*3/uL (ref 0.7–4.0)
MCH: 30.3 pg (ref 26.0–34.0)
MCHC: 35.1 g/dL (ref 30.0–36.0)
MCV: 86.3 fL (ref 80.0–100.0)
Monocytes Absolute: 2.5 10*3/uL — ABNORMAL HIGH (ref 0.1–1.0)
Monocytes Relative: 13 %
Neutro Abs: 16.3 10*3/uL — ABNORMAL HIGH (ref 1.7–7.7)
Neutrophils Relative %: 81 %
Platelets: 272 10*3/uL (ref 150–400)
RBC: 4.39 MIL/uL (ref 4.22–5.81)
RDW: 13 % (ref 11.5–15.5)
Smear Review: NORMAL
WBC: 20.1 10*3/uL — ABNORMAL HIGH (ref 4.0–10.5)
nRBC: 0 % (ref 0.0–0.2)

## 2021-09-12 LAB — URINALYSIS, ROUTINE W REFLEX MICROSCOPIC
Glucose, UA: 100 mg/dL — AB
Leukocytes,Ua: NEGATIVE
Nitrite: NEGATIVE
Protein, ur: 100 mg/dL — AB
Specific Gravity, Urine: >1.03 — ABNORMAL HIGH (ref 1.005–1.030)
Squamous Epithelial / HPF: NONE SEEN (ref 0–5)
pH: 5 (ref 5.0–8.0)

## 2021-09-12 LAB — BETA-HYDROXYBUTYRIC ACID
Beta-Hydroxybutyric Acid: 2.18 mmol/L — ABNORMAL HIGH (ref 0.05–0.27)
Beta-Hydroxybutyric Acid: <0.05 mmol/L — ABNORMAL LOW (ref 0.05–0.27)

## 2021-09-12 LAB — PROCALCITONIN: Procalcitonin: 43.74 ng/mL

## 2021-09-12 LAB — OSMOLALITY: Osmolality: 308 mOsm/kg — ABNORMAL HIGH (ref 275–295)

## 2021-09-12 LAB — GLUCOSE, CAPILLARY
Glucose-Capillary: 198 mg/dL — ABNORMAL HIGH (ref 70–99)
Glucose-Capillary: 203 mg/dL — ABNORMAL HIGH (ref 70–99)

## 2021-09-12 LAB — CORTISOL-AM, BLOOD: Cortisol - AM: 38.7 ug/dL — ABNORMAL HIGH (ref 6.7–22.6)

## 2021-09-12 LAB — PROTIME-INR
INR: 1.4 — ABNORMAL HIGH (ref 0.8–1.2)
Prothrombin Time: 16.9 seconds — ABNORMAL HIGH (ref 11.4–15.2)

## 2021-09-12 MED ORDER — SODIUM CHLORIDE 0.9 % IV SOLN: INTRAVENOUS | Status: DC

## 2021-09-12 MED ORDER — INSULIN REGULAR(HUMAN) IN NACL 100-0.9 UT/100ML-% IV SOLN
INTRAVENOUS | Status: DC
  Administered 2021-09-12: 8 [IU]/h via INTRAVENOUS
  Filled 2021-09-12: qty 100

## 2021-09-12 MED ORDER — SODIUM CHLORIDE 0.9 % IV SOLN: INTRAVENOUS | Status: DC | PRN

## 2021-09-12 MED ORDER — INSULIN ASPART 100 UNIT/ML IJ SOLN: 4.0000 [IU] | INTRAMUSCULAR | Status: DC

## 2021-09-12 MED ORDER — DEXTROSE 10 % IV SOLN: INTRAVENOUS | Status: DC

## 2021-09-12 MED ORDER — LACTATED RINGERS IV SOLN: INTRAVENOUS | Status: DC

## 2021-09-12 MED ORDER — DEXTROSE 50 % IV SOLN: 0.0000 mL | INTRAVENOUS | Status: DC | PRN

## 2021-09-12 MED ORDER — INSULIN GLARGINE-YFGN 100 UNIT/ML ~~LOC~~ SOLN
15.0000 [IU] | Freq: Every day | SUBCUTANEOUS | Status: DC
  Filled 2021-09-12: qty 0.15

## 2021-09-12 MED ORDER — INSULIN GLARGINE-YFGN 100 UNIT/ML ~~LOC~~ SOLN
15.0000 [IU] | Freq: Every day | SUBCUTANEOUS | Status: DC
  Administered 2021-09-12: 15 [IU] via SUBCUTANEOUS
  Filled 2021-09-12 (×2): qty 0.15

## 2021-09-12 MED ORDER — POTASSIUM CHLORIDE 10 MEQ/100ML IV SOLN
10.0000 meq | INTRAVENOUS | Status: AC
  Administered 2021-09-12 (×2): 10 meq via INTRAVENOUS
  Filled 2021-09-12 (×2): qty 100

## 2021-09-12 MED ORDER — LACTATED RINGERS IV BOLUS: 20.0000 mL/kg | Freq: Once | INTRAVENOUS | Status: DC

## 2021-09-12 MED ORDER — DEXTROSE IN LACTATED RINGERS 5 % IV SOLN: INTRAVENOUS | Status: DC

## 2021-09-12 MED ORDER — INSULIN ASPART 100 UNIT/ML IJ SOLN
0.0000 [IU] | INTRAMUSCULAR | Status: DC
  Administered 2021-09-12: 3 [IU] via SUBCUTANEOUS
  Administered 2021-09-13: 06:00:00 2 [IU] via SUBCUTANEOUS
  Administered 2021-09-13: 02:00:00 3 [IU] via SUBCUTANEOUS
  Filled 2021-09-12 (×3): qty 1

## 2021-09-12 MED ORDER — SODIUM CHLORIDE 0.45 % IV SOLN
INTRAVENOUS | Status: DC
Start: 2021-09-12 — End: 2021-09-12

## 2021-09-12 NOTE — ED Notes (Signed)
Message from MD Swayze in regards to protocol for dc'ing insulin gtt: Please just start the long acting insulin now instead of waiting. Then discontinue drip 2 hours later. Then follow sliding scale.

## 2021-09-12 NOTE — ED Notes (Signed)
Secure chat sent to Dr Benny Lennert to change pt level of care

## 2021-09-12 NOTE — ED Notes (Signed)
Messaged attending about patient endotool prompt of patient CBG goal ect. Awaiting orders

## 2021-09-12 NOTE — ED Notes (Signed)
Pt stating they have a diabetic ulcer on R foot

## 2021-09-12 NOTE — ED Notes (Signed)
Messaged attending about LR, .45% NS and insulin orders placed on patient

## 2021-09-12 NOTE — ED Notes (Signed)
Via secure messaging md informed this rn to give long acting insulin at this time, and dc the IV gtt 2 hours post admin/ This rn asked the MD to reorder the long acting insulin for stat and not at bedtime. Md verbalized understanding "I will try"

## 2021-09-12 NOTE — ED Notes (Signed)
PT to ICU with Candace RN

## 2021-09-12 NOTE — Progress Notes (Addendum)
PROGRESS NOTE  Joseph [name] WPY:[id number] DOB: 1971/06/01 DOA: 09/11/2021 PCP: [facility] [place]  Brief History    Joseph [name] is a 50 y.o. male with medical history significant for Type I DM, HTN, CAD with history of MI, ischemic cardiomyopathy, systolic CHF s/p AICD, HTN, prostate cancer s/p prostatectomy 12/2019 who presents to the ED with a 1 week history of flulike symptoms with cough, generalized malaise, muscle aches and fever with worsening symptoms over the past couple days with cough productive of thick yellow phlegm.  He denies chest pain.  Denies nausea, vomiting, abdominal pain or diarrhea   ED course: T-max 103, tachycardic to 130 and tachypneic to 20, BP 160/112.  Hypoxic to 87% in the ED requiring 2 L to maintain sats in the mid 90s Blood work with WBC 19,000, procalcitonin 8.87 creatinine 1.66 above baseline of 0.86.  Blood sugar 535 Troponin 26-22   Influenza A+   EKG: Sinus tachycardia at 130 with nonspecific ST-T wave changes   Imaging: Chest x-ray suggestive of multifocal pneumonia and interstitial pneumonitis   Patient started on azithromycin and ceftriaxone and tamiflu.  Hospitalist consulted for admission.   Into the next morning the patient's glucoses remained extremely high to the 570's. Beta hydroxybutyric acid was positive. The patient has been admitted to the step down unit on an endotool.    He is saturating 95% on 4L. 87% on room air.  Consultants  None  Procedures  None  Antibiotics   Anti-infectives (From admission, onward)    Start     Dose/Rate Route Frequency Ordered Stop   09/11/21 2200  oseltamivir (TAMIFLU) capsule 75 mg        75 mg Oral 2 times daily 09/11/21 2153 09/16/21 2159   09/11/21 2200  cefTRIAXone (ROCEPHIN) 2 g in sodium chloride 0.9 % 100 mL IVPB  Status:  Discontinued        2 g 200 mL/hr over 30 Minutes Intravenous Every 24 hours 09/11/21 2156 09/11/21 2159   09/11/21 2200  azithromycin (ZITHROMAX) 500 mg in  sodium chloride 0.9 % 250 mL IVPB  Status:  Discontinued        500 mg 250 mL/hr over 60 Minutes Intravenous Every 24 hours 09/11/21 2156 09/11/21 2200   09/11/21 2100  cefTRIAXone (ROCEPHIN) 2 g in sodium chloride 0.9 % 100 mL IVPB        2 g 200 mL/hr over 30 Minutes Intravenous Every 24 hours 09/11/21 2050 09/16/21 2059   09/11/21 2100  azithromycin (ZITHROMAX) 500 mg in sodium chloride 0.9 % 250 mL IVPB        500 mg 250 mL/hr over 60 Minutes Intravenous Every 24 hours 09/11/21 2050 09/16/21 2059       Interval History/Subjective  The patient is lying quietly. No new complaints.  Objective   Vitals:  Vitals:   09/12/21 1400 09/12/21 1405  BP: 138/71   Pulse: (!) 117   Resp: (!) 24   Temp:  99.4 F (37.4 C)  SpO2: 94%     Exam:  Constitutional:  The patient is awake, alert, and oriented x 3. No acute distress. Respiratory:  Mildly increased work of breathing with dyspnea on conversation No wheezes or rhonchi Positive for coarse rales throughout. No tactile fremitus Cardiovascular:  Regular rate and rhythm No murmurs, ectopy, or gallups. No lateral PMI. No thrills. Abdomen:  Abdomen is soft, non-tender, non-distended No hernias, masses, or organomegaly Normoactive bowel sounds.  Musculoskeletal:  No cyanosis, clubbing,  or edema Skin:  No rashes, lesions, ulcers palpation of skin: no induration or nodules Neurologic:  CN 2-12 intact Sensation all 4 extremities intact Psychiatric:  Mental status Mood, affect appropriate Orientation to person, place, time  judgment and insight appear intact  I have personally reviewed the following:   Today's Data  Vitals  Lab Data  CBC, BMP, Glucoses, Beta hydroxy butyric acid  Micro Data  Blood cultures x 2 - no growth Positive for influenza A Negative for COVID-19  Imaging  CXR   Cardiology Data  EKG Troponins  Scheduled Meds:  enoxaparin (LOVENOX) injection  40 mg Subcutaneous Q24H   oseltamivir  75  mg Oral BID   Continuous Infusions:  azithromycin     cefTRIAXone (ROCEPHIN)  IV Stopped (09/11/21 2205)   dextrose 5% lactated ringers     insulin 8 Units/hr (09/12/21 1517)   lactated ringers Stopped (09/12/21 0801)   lactated ringers 125 mL/hr at 09/12/21 0904    Principal Problem:   Influenza A Active Problems:   Multifocal pneumonia   Chronic systolic CHF (congestive heart failure) (HCC)   Prostate cancer (HCC)   CAD (coronary artery disease)   Implantable cardioverter-defibrillator (ICD) in situ   Obstructive sleep apnea on CPAP   Severe sepsis (HCC)   Hyperglycemia due to type 1 diabetes mellitus (HCC)   Diabetes mellitus without complication (HCC)   Acute respiratory failure with hypoxia (HCC)   AKI (acute kidney injury) ([place])   Elevated troponin   LOS: 1 day    A & P  Influenza A with superimposed bacterial infection pneumonia    Severe sepsis (HCC) -Sepsis criteria includes fever, tachycardia, leukocytosis, lactic acid 2.1 hypoxia, AKI  - Tamiflu, Rocephin and azithromycin - Sepsis fluids with close monitoring for fluid overload given systolic heart failure - Symptomatic treatment - Supplemental oxygen to keep sats over 94  Acute respiratory failure with hypoxia - O2 sats 87% on room air with increased work of breathing - Supplemental oxygen to keep sats over 52%   Chronic systolic CHF  Ischemic cardiomyopathy S/p AICD - Euvolemic but high risk of fluid overload with sepsis fluids - Daily weights - Continue CHF meds, carvedilol, torsemide, digoxin and Entresto pending verification     DKA due to DM I - Blood sugar 577 - Patient placed on endotool - IV LR - admit to step down     AKI (acute kidney injury) ([place]) - Creatinine 1.66 about 0.86 - IV hydration as above and monitor   Elevated troponin CAD with history of MI - Troponin showing non-ACS trend and EKG nonacute - Continue to monitor - Continue carvedilol, atorvastatin and aspirin      Prostate cancer s/p prostatectomy - No acute issues     Obstructive sleep apnea on CPAP - CPAP if desired   DVT prophylaxis: Lovenox  Code Status: full code  Family Communication: Wife at bedside Disposition Plan: Back to previous home environment Consults called: none  Status:At the time of admission, it appears that the appropriate admission status for this patient is INPATIENT. This is judged to be reasonable and necessary in order to provide the required intensity of service to ensure the patient's safety given the presenting symptoms, physical exam findings, and initial radiographic and laboratory data in the context of their  Comorbid conditions.   Patient requires inpatient status due to high intensity of service, high risk for further deterioration and high frequency of surveillance required.   I certify that at the point  of admission it is my clinical judgment that the patient will require inpatient hospital care spanning beyond 2 [place], DO [facility] Direct contact: see [email or web address]  7PM-7AM contact night coverage as above 09/12/2021, 4:05 PM  LOS: 1 day

## 2021-09-12 NOTE — ED Notes (Signed)
Called ICU about pt room assignment and RN assigned

## 2021-09-12 NOTE — ED Notes (Signed)
Spoke with ICU RN Danae Chen and was told that "we will get you a nurse for that patient room, and will call you shortly."

## 2021-09-12 NOTE — ED Notes (Signed)
Lab at bedside to draw blood.

## 2021-09-12 NOTE — ED Notes (Signed)
Messaged DO Swayze for clarification on insulin gtt and orders to follow.

## 2021-09-13 LAB — CBC WITH DIFFERENTIAL/PLATELET
Abs Immature Granulocytes: 0.18 10*3/uL — ABNORMAL HIGH (ref 0.00–0.07)
Basophils Absolute: 0.1 10*3/uL (ref 0.0–0.1)
Basophils Relative: 0 %
Eosinophils Absolute: 0 10*3/uL (ref 0.0–0.5)
Eosinophils Relative: 0 %
HCT: 36.3 % — ABNORMAL LOW (ref 39.0–52.0)
Hemoglobin: 12.1 g/dL — ABNORMAL LOW (ref 13.0–17.0)
Immature Granulocytes: 1 %
Lymphocytes Relative: 7 %
Lymphs Abs: 1.3 10*3/uL (ref 0.7–4.0)
MCH: 29.6 pg (ref 26.0–34.0)
MCHC: 33.3 g/dL (ref 30.0–36.0)
MCV: 88.8 fL (ref 80.0–100.0)
Monocytes Absolute: 2.2 10*3/uL — ABNORMAL HIGH (ref 0.1–1.0)
Monocytes Relative: 12 %
Neutro Abs: 15.2 10*3/uL — ABNORMAL HIGH (ref 1.7–7.7)
Neutrophils Relative %: 80 %
Platelets: 248 10*3/uL (ref 150–400)
RBC: 4.09 MIL/uL — ABNORMAL LOW (ref 4.22–5.81)
RDW: 13.2 % (ref 11.5–15.5)
Smear Review: UNDETERMINED
WBC: 18.7 10*3/uL — ABNORMAL HIGH (ref 4.0–10.5)
nRBC: 0 % (ref 0.0–0.2)

## 2021-09-13 LAB — BASIC METABOLIC PANEL
Anion gap: 7 (ref 5–15)
BUN: 66 mg/dL — ABNORMAL HIGH (ref 6–20)
CO2: 25 mmol/L (ref 22–32)
Calcium: 7.6 mg/dL — ABNORMAL LOW (ref 8.9–10.3)
Chloride: 97 mmol/L — ABNORMAL LOW (ref 98–111)
Creatinine, Ser: 1.78 mg/dL — ABNORMAL HIGH (ref 0.61–1.24)
GFR, Estimated: 46 mL/min — ABNORMAL LOW (ref >60)
Glucose, Bld: 205 mg/dL — ABNORMAL HIGH (ref 70–99)
Potassium: 3.3 mmol/L — ABNORMAL LOW (ref 3.5–5.1)
Sodium: 129 mmol/L — ABNORMAL LOW (ref 135–145)

## 2021-09-13 LAB — GLUCOSE, CAPILLARY
Glucose-Capillary: 220 mg/dL — ABNORMAL HIGH (ref 70–99)
Glucose-Capillary: 185 mg/dL — ABNORMAL HIGH (ref 70–99)
Glucose-Capillary: 202 mg/dL — ABNORMAL HIGH (ref 70–99)
Glucose-Capillary: 292 mg/dL — ABNORMAL HIGH (ref 70–99)
Glucose-Capillary: 275 mg/dL — ABNORMAL HIGH (ref 70–99)

## 2021-09-13 LAB — C DIFFICILE QUICK SCREEN W PCR REFLEX
C Diff antigen: POSITIVE — AB
C Diff toxin: NEGATIVE

## 2021-09-13 LAB — BETA-HYDROXYBUTYRIC ACID: Beta-Hydroxybutyric Acid: 0.39 mmol/L — ABNORMAL HIGH (ref 0.05–0.27)

## 2021-09-13 LAB — MRSA NEXT GEN BY PCR, NASAL: MRSA by PCR Next Gen: NOT DETECTED

## 2021-09-13 LAB — CLOSTRIDIUM DIFFICILE BY PCR, REFLEXED: Toxigenic C. Difficile by PCR: NEGATIVE

## 2021-09-13 MED ORDER — INSULIN ASPART 100 UNIT/ML IJ SOLN
0.0000 [IU] | Freq: Three times a day (TID) | INTRAMUSCULAR | Status: DC
  Administered 2021-09-13 – 2021-09-14 (×3): 5 [IU] via SUBCUTANEOUS
  Administered 2021-09-14: 3 [IU] via SUBCUTANEOUS
  Administered 2021-09-14: 9 [IU] via SUBCUTANEOUS
  Administered 2021-09-14 (×2): 5 [IU] via SUBCUTANEOUS
  Administered 2021-09-15: 7 [IU] via SUBCUTANEOUS
  Administered 2021-09-15 (×2): 3 [IU] via SUBCUTANEOUS
  Administered 2021-09-15: 5 [IU] via SUBCUTANEOUS
  Administered 2021-09-16: 3 [IU] via SUBCUTANEOUS
  Administered 2021-09-16: 5 [IU] via SUBCUTANEOUS
  Administered 2021-09-16: 3 [IU] via SUBCUTANEOUS
  Administered 2021-09-16: 5 [IU] via SUBCUTANEOUS
  Administered 2021-09-17 (×3): 2 [IU] via SUBCUTANEOUS
  Filled 2021-09-13 (×16): qty 1

## 2021-09-13 MED ORDER — CHLORHEXIDINE GLUCONATE CLOTH 2 % EX PADS
6.0000 | MEDICATED_PAD | Freq: Every day | CUTANEOUS | Status: DC
  Administered 2021-09-13 – 2021-09-16 (×3): 6 via TOPICAL

## 2021-09-13 MED ORDER — INSULIN GLARGINE-YFGN 100 UNIT/ML ~~LOC~~ SOLN
20.0000 [IU] | Freq: Every day | SUBCUTANEOUS | Status: DC
  Administered 2021-09-14: 20 [IU] via SUBCUTANEOUS
  Filled 2021-09-13 (×2): qty 0.2

## 2021-09-13 MED ORDER — ORAL CARE MOUTH RINSE
15.0000 mL | Freq: Two times a day (BID) | OROMUCOSAL | Status: DC
  Administered 2021-09-13 – 2021-09-17 (×9): 15 mL via OROMUCOSAL

## 2021-09-13 MED ORDER — POTASSIUM CHLORIDE CRYS ER 20 MEQ PO TBCR
40.0000 meq | EXTENDED_RELEASE_TABLET | Freq: Once | ORAL | Status: AC
  Administered 2021-09-13: 10:00:00 40 meq via ORAL
  Filled 2021-09-13: qty 2

## 2021-09-13 NOTE — Progress Notes (Signed)
Notified Dr. Hal Hope of insulin orders needing to be clarified. New verbal order received to only give SSI q 4 hours.

## 2021-09-13 NOTE — Hospital Course (Signed)
d 

## 2021-09-13 NOTE — Progress Notes (Signed)
PROGRESS NOTE  Joseph [name] YOV:[id number] DOB: Jun 21, 1971 DOA: 09/11/2021 PCP: [facility] [place]  Brief History    Joseph [name] is a 50 y.o. male with medical history significant for Type I DM, HTN, CAD with history of MI, ischemic cardiomyopathy, systolic CHF s/p AICD, HTN, prostate cancer s/p prostatectomy 12/2019 who presents to the ED with a 1 week history of flulike symptoms with cough, generalized malaise, muscle aches and fever with worsening symptoms over the past couple days with cough productive of thick yellow phlegm.  He denies chest pain.  Denies nausea, vomiting, abdominal pain or diarrhea   ED course: T-max 103, tachycardic to 130 and tachypneic to 20, BP 160/112.  Hypoxic to 87% in the ED requiring 2 L to maintain sats in the mid 90s Blood work with WBC 19,000, procalcitonin 8.87 creatinine 1.66 above baseline of 0.86.  Blood sugar 535 Troponin 26-22   Influenza A+   EKG: Sinus tachycardia at 130 with nonspecific ST-T wave changes   Imaging: Chest x-ray suggestive of multifocal pneumonia and interstitial pneumonitis   Patient started on azithromycin and ceftriaxone and tamiflu.  Hospitalist consulted for admission.   Into the next morning the patient's glucoses remained extremely high to the 570's. Beta hydroxybutyric acid was positive. The patient has been admitted to the step down unit on an endotool for DKA.  The patient's DKA resolved during the day. IV insulin was discontinued, diet was started and here was placed on subcutaneous insulin.   He is saturating 97% on 3L this morning. He remains slightly tachycardic.  Consultants  None  Procedures  None  Antibiotics   Anti-infectives (From admission, onward)    Start     Dose/Rate Route Frequency Ordered Stop   09/11/21 2200  oseltamivir (TAMIFLU) capsule 75 mg        75 mg Oral 2 times daily 09/11/21 2153 09/16/21 2159   09/11/21 2200  cefTRIAXone (ROCEPHIN) 2 g in sodium chloride 0.9 % 100 mL  IVPB  Status:  Discontinued        2 g 200 mL/hr over 30 Minutes Intravenous Every 24 hours 09/11/21 2156 09/11/21 2159   09/11/21 2200  azithromycin (ZITHROMAX) 500 mg in sodium chloride 0.9 % 250 mL IVPB  Status:  Discontinued        500 mg 250 mL/hr over 60 Minutes Intravenous Every 24 hours 09/11/21 2156 09/11/21 2200   09/11/21 2100  cefTRIAXone (ROCEPHIN) 2 g in sodium chloride 0.9 % 100 mL IVPB        2 g 200 mL/hr over 30 Minutes Intravenous Every 24 hours 09/11/21 2050 09/16/21 2059   09/11/21 2100  azithromycin (ZITHROMAX) 500 mg in sodium chloride 0.9 % 250 mL IVPB        500 mg 250 mL/hr over 60 Minutes Intravenous Every 24 hours 09/11/21 2050 09/16/21 2059       Interval History/Subjective  The patient is lying quietly. No new complaints.  Objective   Vitals:  Vitals:   09/13/21 1200 09/13/21 1300  BP: (!) 146/81 131/69  Pulse: (!) 113   Resp: 19 (!) 23  Temp: 100.1 F (37.8 C)   SpO2: 97%     Exam:  Constitutional:  The patient is awake, alert, and oriented x 3. No acute distress. Respiratory:  No increased work of breathing No wheezes or rhonchi Positive for coarse rales throughout. No tactile fremitus Cardiovascular:  Regular rate and rhythm No murmurs, ectopy, or gallups. No lateral PMI. No  thrills. Abdomen:  Abdomen is soft, non-tender, non-distended No hernias, masses, or organomegaly Normoactive bowel sounds.  Musculoskeletal:  No cyanosis, clubbing, or edema Skin:  No rashes, lesions, ulcers palpation of skin: no induration or nodules Neurologic:  CN 2-12 intact Sensation all 4 extremities intact Psychiatric:  Mental status Mood, affect appropriate Orientation to person, place, time  judgment and insight appear intact  I have personally reviewed the following:   Today's Data  Vitals  Lab Data  CBC, BMP, Glucoses   Micro Data  Blood cultures x 2 - no growth Positive for influenza A Negative for COVID-19  Imaging  CXR    Cardiology Data  EKG Troponins  Scheduled Meds:  Chlorhexidine Gluconate Cloth  6 each Topical Daily   enoxaparin (LOVENOX) injection  40 mg Subcutaneous Q24H   insulin aspart  0-9 Units Subcutaneous TID AC & HS   insulin glargine-yfgn  15 Units Subcutaneous QHS   mouth rinse  15 mL Mouth Rinse BID   oseltamivir  75 mg Oral BID   Continuous Infusions:  sodium chloride 75 mL/hr at 09/13/21 1206   sodium chloride Stopped (09/12/21 2344)   azithromycin Stopped (09/12/21 2332)   cefTRIAXone (ROCEPHIN)  IV Stopped (09/12/21 2257)   lactated ringers Stopped (09/12/21 0801)    Principal Problem:   Influenza A Active Problems:   Multifocal pneumonia   Chronic systolic CHF (congestive heart failure) (HCC)   Prostate cancer (HCC)   CAD (coronary artery disease)   Implantable cardioverter-defibrillator (ICD) in situ   Obstructive sleep apnea on CPAP   Severe sepsis (HCC)   Hyperglycemia due to type 1 diabetes mellitus (HCC)   Diabetes mellitus without complication (HCC)   Acute respiratory failure with hypoxia (HCC)   AKI (acute kidney injury) ([place])   Elevated troponin   LOS: 2 days    A & P  Influenza A with superimposed bacterial infection pneumonia    Severe sepsis (HCC) -Sepsis criteria includes fever, tachycardia, leukocytosis, lactic acid 2.1 hypoxia, AKI  - Tamiflu, Rocephin and azithromycin - Sepsis fluids with close monitoring for fluid overload given systolic heart failure - Symptomatic treatment - patient is currently requiring 3L to maintain saturations of 94%.  Acute respiratory failure with hypoxia - O2 sats 87% on room air with increased work of breathing - Supplemental oxygen to keep sats over 79%   Chronic systolic CHF  Ischemic cardiomyopathy S/p AICD - Euvolemic but high risk of fluid overload with sepsis fluids - Daily weights - Continue CHF meds, carvedilol, torsemide, digoxin and Entresto pending verification     DKA due to DM I - Resolved.  Endotool off. Patient transferred to floor  - DM I: Glucoses are high today in the 300's. Will increase lantus to 20 units daily with SSI. Monitor.     AKI (acute kidney injury) ([place]) - Creatinine baseline of 0.88. 2.63 on admission. 1.78 this morning. -Continue IV fluids cautiously. - Monitor creatinine, electrolytes, and volume status   Elevated troponin CAD with history of MI - Troponin showing non-ACS trend and EKG nonacute - Continue to monitor - Continue carvedilol, atorvastatin and aspirin     Prostate cancer s/p prostatectomy - No acute issues     Obstructive sleep apnea on CPAP - CPAP if desired  I have seen and examined this patient myself. I have spent 34 minutes in his evaluation and care.   DVT prophylaxis: Lovenox  Code Status: full code  Family Communication: Wife at bedside Disposition Plan: Back to previous  home environment Consults called: none  Status: Inpatient.   Rashema Seawright, DO [facility] Direct contact: see [email or web address]  7PM-7AM contact night coverage as above 09/13/2021, 4:33 PM  LOS: 1 day

## 2021-09-13 NOTE — Plan of Care (Signed)
A&O patient from home, came to ED with SOB, hypoxia, and CBG 544. Patient placed on insulin gtt and transitioned on arrival to [place]. Patient also influenza A positive and on droplet precautions. Patient also reports having his flu vaccine this season.

## 2021-09-14 LAB — GLUCOSE, CAPILLARY
Glucose-Capillary: 208 mg/dL — ABNORMAL HIGH (ref 70–99)
Glucose-Capillary: 228 mg/dL — ABNORMAL HIGH (ref 70–99)
Glucose-Capillary: 280 mg/dL — ABNORMAL HIGH (ref 70–99)
Glucose-Capillary: 291 mg/dL — ABNORMAL HIGH (ref 70–99)
Glucose-Capillary: 376 mg/dL — ABNORMAL HIGH (ref 70–99)

## 2021-09-14 LAB — BASIC METABOLIC PANEL
Anion gap: 7 (ref 5–15)
BUN: 41 mg/dL — ABNORMAL HIGH (ref 6–20)
CO2: 25 mmol/L (ref 22–32)
Calcium: 7.9 mg/dL — ABNORMAL LOW (ref 8.9–10.3)
Chloride: 103 mmol/L (ref 98–111)
Creatinine, Ser: 1.03 mg/dL (ref 0.61–1.24)
GFR, Estimated: >60 mL/min (ref >60)
Glucose, Bld: 341 mg/dL — ABNORMAL HIGH (ref 70–99)
Potassium: 4 mmol/L (ref 3.5–5.1)
Sodium: 135 mmol/L (ref 135–145)

## 2021-09-14 LAB — HEMOGLOBIN A1C
Hgb A1c MFr Bld: 10.9 % — ABNORMAL HIGH (ref 4.8–5.6)
Mean Plasma Glucose: 266 mg/dL

## 2021-09-14 LAB — HIV ANTIBODY (ROUTINE TESTING W REFLEX): HIV Screen 4th Generation wRfx: NONREACTIVE

## 2021-09-14 MED ORDER — TORSEMIDE 20 MG PO TABS
20.0000 mg | ORAL_TABLET | Freq: Every day | ORAL | Status: DC
  Administered 2021-09-14 – 2021-09-17 (×4): 20 mg via ORAL
  Filled 2021-09-14 (×4): qty 1

## 2021-09-14 MED ORDER — ATORVASTATIN CALCIUM 20 MG PO TABS
40.0000 mg | ORAL_TABLET | Freq: Every day | ORAL | Status: DC
  Administered 2021-09-14 – 2021-09-17 (×4): 40 mg via ORAL
  Filled 2021-09-14 (×5): qty 2

## 2021-09-14 MED ORDER — CARVEDILOL 12.5 MG PO TABS
12.5000 mg | ORAL_TABLET | Freq: Every day | ORAL | Status: DC
  Administered 2021-09-14 – 2021-09-17 (×4): 12.5 mg via ORAL
  Filled 2021-09-14 (×4): qty 1

## 2021-09-14 MED ORDER — INSULIN GLARGINE-YFGN 100 UNIT/ML ~~LOC~~ SOLN
30.0000 [IU] | Freq: Every day | SUBCUTANEOUS | Status: DC
  Administered 2021-09-14 – 2021-09-15 (×2): 30 [IU] via SUBCUTANEOUS
  Filled 2021-09-14 (×3): qty 0.3

## 2021-09-14 MED ORDER — MOMETASONE FURO-FORMOTEROL FUM 200-5 MCG/ACT IN AERO
2.0000 | INHALATION_SPRAY | Freq: Two times a day (BID) | RESPIRATORY_TRACT | Status: DC
  Administered 2021-09-14 – 2021-09-17 (×6): 2 via RESPIRATORY_TRACT
  Filled 2021-09-14: qty 8.8

## 2021-09-14 MED ORDER — SACUBITRIL-VALSARTAN 97-103 MG PO TABS
1.0000 | ORAL_TABLET | Freq: Two times a day (BID) | ORAL | Status: DC
  Administered 2021-09-14 – 2021-09-17 (×6): 1 via ORAL
  Filled 2021-09-14 (×8): qty 1

## 2021-09-14 MED ORDER — SPIRONOLACTONE 25 MG PO TABS
12.5000 mg | ORAL_TABLET | Freq: Every day | ORAL | Status: DC
  Administered 2021-09-14 – 2021-09-17 (×4): 12.5 mg via ORAL
  Filled 2021-09-14 (×2): qty 1
  Filled 2021-09-14 (×4): qty 0.5
  Filled 2021-09-14: qty 1

## 2021-09-14 MED ORDER — POTASSIUM CHLORIDE CRYS ER 10 MEQ PO TBCR
10.0000 meq | EXTENDED_RELEASE_TABLET | Freq: Every day | ORAL | Status: DC
  Administered 2021-09-14 – 2021-09-17 (×4): 10 meq via ORAL
  Filled 2021-09-14 (×4): qty 1

## 2021-09-14 MED ORDER — AZITHROMYCIN 250 MG PO TABS
500.0000 mg | ORAL_TABLET | Freq: Every day | ORAL | Status: AC
  Administered 2021-09-14 – 2021-09-15 (×2): 500 mg via ORAL
  Filled 2021-09-14: qty 1
  Filled 2021-09-14: qty 2
  Filled 2021-09-14: qty 1

## 2021-09-14 MED ORDER — DIGOXIN 125 MCG PO TABS
125.0000 ug | ORAL_TABLET | Freq: Every day | ORAL | Status: DC
  Administered 2021-09-14 – 2021-09-17 (×4): 125 ug via ORAL
  Filled 2021-09-14 (×4): qty 1

## 2021-09-14 MED ORDER — ASPIRIN EC 81 MG PO TBEC
81.0000 mg | DELAYED_RELEASE_TABLET | ORAL | Status: DC
  Administered 2021-09-15 – 2021-09-17 (×3): 81 mg via ORAL
  Filled 2021-09-14 (×4): qty 1

## 2021-09-14 NOTE — Progress Notes (Signed)
PHARMACIST - PHYSICIAN COMMUNICATION  CONCERNING: Antibiotic IV to Oral Route Change Policy  RECOMMENDATION: This patient is receiving azithromycin by the intravenous route.  Based on criteria approved by the Pharmacy and Therapeutics Committee, the antibiotic(s) is/are being converted to the equivalent oral dose form(s).   DESCRIPTION: These criteria include: Patient being treated for a respiratory tract infection, urinary tract infection, cellulitis or clostridium difficile associated diarrhea if on metronidazole The patient is not neutropenic and does not exhibit a GI malabsorption state The patient is eating (either orally or via tube) and/or has been taking other orally administered medications for a least 24 hours The patient is improving clinically and has a Tmax < 100.5  If you have questions about this conversion, please contact the [facility]  09/14/21

## 2021-09-14 NOTE — Progress Notes (Signed)
PROGRESS NOTE  Joseph [name] RCV:[id number] DOB: 20-Oct-1970 DOA: 09/11/2021 PCP: [facility] [place]  Brief History    Joseph [name] is a 50 y.o. male with medical history significant for Type I DM, HTN, CAD with history of MI, ischemic cardiomyopathy, systolic CHF s/p AICD, HTN, prostate cancer s/p prostatectomy 12/2019 who presents to the ED with a 1 week history of flulike symptoms with cough, generalized malaise, muscle aches and fever with worsening symptoms over the past couple days with cough productive of thick yellow phlegm.  He denies chest pain.  Denies nausea, vomiting, abdominal pain or diarrhea   ED course: T-max 103, tachycardic to 130 and tachypneic to 20, BP 160/112.  Hypoxic to 87% in the ED requiring 2 L to maintain sats in the mid 90s Blood work with WBC 19,000, procalcitonin 8.87 creatinine 1.66 above baseline of 0.86.  Blood sugar 535 Troponin 26-22   Influenza A+   EKG: Sinus tachycardia at 130 with nonspecific ST-T wave changes   Imaging: Chest x-ray suggestive of multifocal pneumonia and interstitial pneumonitis   Patient started on azithromycin and ceftriaxone and tamiflu.  Hospitalist consulted for admission.   Into the next morning the patient's glucoses remained extremely high to the 570's. Beta hydroxybutyric acid was positive. The patient has been admitted to the step down unit on an endotool for DKA.  The patient's DKA resolved during the day. IV insulin was discontinued, diet was started and here was placed on subcutaneous insulin.   He is saturating 97% on 3L this morning. He remains slightly tachycardic.   Consultants  None  Procedures  None  Antibiotics   Anti-infectives (From admission, onward)    Start     Dose/Rate Route Frequency Ordered Stop   09/14/21 2200  azithromycin (ZITHROMAX) tablet 500 mg        500 mg Oral Daily at bedtime 09/14/21 1327 09/16/21 2159   09/11/21 2200  oseltamivir (TAMIFLU) capsule 75 mg        75 mg Oral  2 times daily 09/11/21 2153 09/16/21 2159   09/11/21 2200  cefTRIAXone (ROCEPHIN) 2 g in sodium chloride 0.9 % 100 mL IVPB  Status:  Discontinued        2 g 200 mL/hr over 30 Minutes Intravenous Every 24 hours 09/11/21 2156 09/11/21 2159   09/11/21 2200  azithromycin (ZITHROMAX) 500 mg in sodium chloride 0.9 % 250 mL IVPB  Status:  Discontinued        500 mg 250 mL/hr over 60 Minutes Intravenous Every 24 hours 09/11/21 2156 09/11/21 2200   09/11/21 2100  cefTRIAXone (ROCEPHIN) 2 g in sodium chloride 0.9 % 100 mL IVPB        2 g 200 mL/hr over 30 Minutes Intravenous Every 24 hours 09/11/21 2050 09/16/21 2059   09/11/21 2100  azithromycin (ZITHROMAX) 500 mg in sodium chloride 0.9 % 250 mL IVPB  Status:  Discontinued        500 mg 250 mL/hr over 60 Minutes Intravenous Every 24 hours 09/11/21 2050 09/14/21 1327       Interval History/Subjective  The patient is lying quietly. He states that he is no better.  Objective   Vitals:  Vitals:   09/14/21 1500 09/14/21 1700  BP: 140/83 (!) 147/84  Pulse: (!) 105 (!) 103  Resp:    Temp:  (!) 100.9 F (38.3 C)  SpO2: 99% 98%    Exam:  Constitutional:  The patient is awake, alert, and oriented x 3.  No acute distress. Respiratory:  No increased work of breathing No wheezes or rhonchi Positive for coarse rales throughout. No tactile fremitus Cardiovascular:  Regular rate and rhythm No murmurs, ectopy, or gallups. No lateral PMI. No thrills. Abdomen:  Abdomen is soft, non-tender, non-distended No hernias, masses, or organomegaly Normoactive bowel sounds.  Musculoskeletal:  No cyanosis, clubbing, or edema Skin:  No rashes, lesions, ulcers palpation of skin: no induration or nodules Neurologic:  CN 2-12 intact Sensation all 4 extremities intact Psychiatric:  Mental status Mood, affect appropriate Orientation to person, place, time  judgment and insight appear intact  I have personally reviewed the following:   Today's  Data  Vitals  Lab Data  CBC, BMP, Glucoses   Micro Data  Blood cultures x 2 - no growth Positive for influenza A Negative for COVID-19  Imaging  CXR   Cardiology Data  EKG Troponins  Scheduled Meds:  azithromycin  500 mg Oral QHS   Chlorhexidine Gluconate Cloth  6 each Topical Daily   enoxaparin (LOVENOX) injection  40 mg Subcutaneous Q24H   insulin aspart  0-9 Units Subcutaneous TID AC & HS   insulin glargine-yfgn  20 Units Subcutaneous QHS   mouth rinse  15 mL Mouth Rinse BID   oseltamivir  75 mg Oral BID   Continuous Infusions:  sodium chloride 75 mL/hr at 09/14/21 0239   sodium chloride Stopped (09/12/21 2344)   cefTRIAXone (ROCEPHIN)  IV 2 g (09/13/21 2040)   lactated ringers Stopped (09/12/21 0801)    Principal Problem:   Influenza A Active Problems:   Multifocal pneumonia   Chronic systolic CHF (congestive heart failure) (HCC)   Prostate cancer (HCC)   CAD (coronary artery disease)   Implantable cardioverter-defibrillator (ICD) in situ   Obstructive sleep apnea on CPAP   Severe sepsis (HCC)   Hyperglycemia due to type 1 diabetes mellitus ([place])   Diabetes mellitus without complication (HCC)   Acute respiratory failure with hypoxia (HCC)   AKI (acute kidney injury) ([place])   Elevated troponin   LOS: 3 days    A & P  Influenza A with superimposed bacterial infection pneumonia    Severe sepsis (HCC) -Sepsis criteria includes fever, tachycardia, leukocytosis, lactic acid 2.1 hypoxia, AKI  - Tamiflu, Rocephin and azithromycin - Sepsis fluids with close monitoring for fluid overload given systolic heart failure - Symptomatic treatment - patient is currently requiring 3L to maintain saturations of 94%.  Acute respiratory failure with hypoxia - O2 sats 87% on room air with increased work of breathing - Supplemental oxygen to keep sats over 31%   Chronic systolic CHF  Ischemic cardiomyopathy S/p AICD - Euvolemic but high risk of fluid overload with  sepsis fluids - Daily weights - Continue CHF meds, carvedilol, torsemide, digoxin and Entresto pending verification     DKA due to DM I - Resolved. Endotool off. Patient transferred to floor  - DM I: Glucoses are high today in the 300's. Will increase lantus to 20 units daily with SSI. Monitor.     AKI (acute kidney injury) ([place]) - Creatinine baseline of 0.88. 2.63 on admission. 1.78 this morning. -Continue IV fluids cautiously. - Monitor creatinine, electrolytes, and volume status   Elevated troponin CAD with history of MI - Troponin showing non-ACS trend and EKG nonacute - Continue to monitor - Continue carvedilol, atorvastatin and aspirin     Prostate cancer s/p prostatectomy - No acute issues     Obstructive sleep apnea on CPAP - CPAP if desired  I have seen and examined this patient myself. I have spent 34 minutes in his evaluation and care.   DVT prophylaxis: Lovenox  Code Status: full code  Family Communication: Wife at bedside Disposition Plan: Back to previous home environment Consults called: none  Status: Inpatient.   Kemond Amorin, DO [facility] Direct contact: see [email or web address]  7PM-7AM contact night coverage as above 09/14/2021, 5:32 PM  LOS: 1 day

## 2021-09-14 NOTE — Progress Notes (Signed)
Inpatient Diabetes Program Recommendations  AACE/ADA: New Consensus Statement on Inpatient Glycemic Control  Target Ranges:  Prepandial:   less than 140 mg/dL      Peak postprandial:   less than 180 mg/dL (1-2 hours)      Critically ill patients:  140 - 180 mg/dL    Latest Reference Range & Units 09/14/21 04:08  Glucose 70 - 99 mg/dL 341 (H)    Latest Reference Range & Units 09/13/21 06:01 09/13/21 07:35 09/13/21 12:01 09/13/21 16:28 09/14/21 00:28  Glucose-Capillary 70 - 99 mg/dL 185 (H) 202 (H) 292 (H) 275 (H) 376 (H)   Review of Glycemic Control  Diabetes history: DM Outpatient Diabetes medications: Tresiba 50 units daily, Humalog 18 units with breakfast, 20 units with lunch, 22 units with supper Current orders for Inpatient glycemic control: Semglee 20 units QHS, Novolog 0-9 units AC&HS  Inpatient Diabetes Program Recommendations:    Insulin: Please consider increasing Semglee to 30 units QHS, adding Novolog 5 units TID with meals for meal coverage if patient eats at least 50% of meals.  Thanks, Barnie Alderman, RN, MSN, CDE Diabetes Coordinator Inpatient Diabetes Program [phone] (Team Pager from 8am to 5pm)

## 2021-09-15 ENCOUNTER — Ambulatory Visit: Payer: PRIVATE HEALTH INSURANCE | Admitting: Urology

## 2021-09-15 ENCOUNTER — Ambulatory Visit: Payer: 59 | Admitting: Podiatry

## 2021-09-15 LAB — GLUCOSE, CAPILLARY
Glucose-Capillary: 232 mg/dL — ABNORMAL HIGH (ref 70–99)
Glucose-Capillary: 270 mg/dL — ABNORMAL HIGH (ref 70–99)
Glucose-Capillary: 308 mg/dL — ABNORMAL HIGH (ref 70–99)
Glucose-Capillary: 239 mg/dL — ABNORMAL HIGH (ref 70–99)

## 2021-09-15 LAB — CBC WITH DIFFERENTIAL/PLATELET
Abs Immature Granulocytes: 0.29 10*3/uL — ABNORMAL HIGH (ref 0.00–0.07)
Basophils Absolute: 0.1 10*3/uL (ref 0.0–0.1)
Basophils Relative: 0 %
Eosinophils Absolute: 0 10*3/uL (ref 0.0–0.5)
Eosinophils Relative: 0 %
HCT: 33.7 % — ABNORMAL LOW (ref 39.0–52.0)
Hemoglobin: 11.1 g/dL — ABNORMAL LOW (ref 13.0–17.0)
Immature Granulocytes: 2 %
Lymphocytes Relative: 9 %
Lymphs Abs: 1.6 10*3/uL (ref 0.7–4.0)
MCH: 30 pg (ref 26.0–34.0)
MCHC: 32.9 g/dL (ref 30.0–36.0)
MCV: 91.1 fL (ref 80.0–100.0)
Monocytes Absolute: 2 10*3/uL — ABNORMAL HIGH (ref 0.1–1.0)
Monocytes Relative: 12 %
Neutro Abs: 13.3 10*3/uL — ABNORMAL HIGH (ref 1.7–7.7)
Neutrophils Relative %: 77 %
Platelets: 288 10*3/uL (ref 150–400)
RBC: 3.7 MIL/uL — ABNORMAL LOW (ref 4.22–5.81)
RDW: 13.9 % (ref 11.5–15.5)
WBC: 17.3 10*3/uL — ABNORMAL HIGH (ref 4.0–10.5)
nRBC: 0 % (ref 0.0–0.2)

## 2021-09-15 LAB — BASIC METABOLIC PANEL
Anion gap: 8 (ref 5–15)
BUN: 30 mg/dL — ABNORMAL HIGH (ref 6–20)
CO2: 26 mmol/L (ref 22–32)
Calcium: 7.8 mg/dL — ABNORMAL LOW (ref 8.9–10.3)
Chloride: 104 mmol/L (ref 98–111)
Creatinine, Ser: 0.88 mg/dL (ref 0.61–1.24)
GFR, Estimated: >60 mL/min (ref >60)
Glucose, Bld: 253 mg/dL — ABNORMAL HIGH (ref 70–99)
Potassium: 3.4 mmol/L — ABNORMAL LOW (ref 3.5–5.1)
Sodium: 138 mmol/L (ref 135–145)

## 2021-09-15 NOTE — Progress Notes (Signed)
PROGRESS NOTE  Joseph [name] VXY:[id number] DOB: 21-Sep-1971 DOA: 09/11/2021 PCP: [facility] [place]  Brief History    Joseph [name] is a 50 y.o. male with medical history significant for Type I DM, HTN, CAD with history of MI, ischemic cardiomyopathy, systolic CHF s/p AICD, HTN, prostate cancer s/p prostatectomy 12/2019 who presents to the ED with a 1 week history of flulike symptoms with cough, generalized malaise, muscle aches and fever with worsening symptoms over the past couple days with cough productive of thick yellow phlegm.  He denies chest pain.  Denies nausea, vomiting, abdominal pain or diarrhea   ED course: T-max 103, tachycardic to 130 and tachypneic to 20, BP 160/112.  Hypoxic to 87% in the ED requiring 2 L to maintain sats in the mid 90s Blood work with WBC 19,000, procalcitonin 8.87 creatinine 1.66 above baseline of 0.86.  Blood sugar 535 Troponin 26-22   Influenza A+   EKG: Sinus tachycardia at 130 with nonspecific ST-T wave changes   Imaging: Chest x-ray suggestive of multifocal pneumonia and interstitial pneumonitis   Patient started on azithromycin and ceftriaxone and tamiflu.  Hospitalist consulted for admission.   Into the next morning the patient's glucoses remained extremely high to the 570's. Beta hydroxybutyric acid was positive. The patient has been admitted to the step down unit on an endotool for DKA.  The patient's DKA resolved during the day. IV insulin was discontinued, diet was started and here was placed on subcutaneous insulin.   He is saturating 97% on 3L this morning. He remains slightly tachycardic.  He states that he is feeling better. He will be transferred out of step-down status.  Consultants  PCCM  Procedures  None  Antibiotics   Anti-infectives (From admission, onward)    Start     Dose/Rate Route Frequency Ordered Stop   09/14/21 2200  azithromycin (ZITHROMAX) tablet 500 mg        500 mg Oral Daily at bedtime 09/14/21 1327  09/16/21 2159   09/11/21 2200  oseltamivir (TAMIFLU) capsule 75 mg        75 mg Oral 2 times daily 09/11/21 2153 09/16/21 2159   09/11/21 2200  cefTRIAXone (ROCEPHIN) 2 g in sodium chloride 0.9 % 100 mL IVPB  Status:  Discontinued        2 g 200 mL/hr over 30 Minutes Intravenous Every 24 hours 09/11/21 2156 09/11/21 2159   09/11/21 2200  azithromycin (ZITHROMAX) 500 mg in sodium chloride 0.9 % 250 mL IVPB  Status:  Discontinued        500 mg 250 mL/hr over 60 Minutes Intravenous Every 24 hours 09/11/21 2156 09/11/21 2200   09/11/21 2100  cefTRIAXone (ROCEPHIN) 2 g in sodium chloride 0.9 % 100 mL IVPB        2 g 200 mL/hr over 30 Minutes Intravenous Every 24 hours 09/11/21 2050 09/16/21 2059   09/11/21 2100  azithromycin (ZITHROMAX) 500 mg in sodium chloride 0.9 % 250 mL IVPB  Status:  Discontinued        500 mg 250 mL/hr over 60 Minutes Intravenous Every 24 hours 09/11/21 2050 09/14/21 1327       Interval History/Subjective  The patient is lying quietly. He states that he is feeling better today. No new complaints.  Objective   Vitals:  Vitals:   09/15/21 1100 09/15/21 1332  BP: 121/67 103/63  Pulse: 99 96  Resp: (!) 26 (!) 22  Temp:  98.6 F (37 C)  SpO2: 96%  96%    Exam:  Constitutional:  The patient is awake, alert, and oriented x 3. No acute distress. Respiratory:  No increased work of breathing No wheezes or rhonchi Positive for coarse rales. Improved air entry. No tactile fremitus Cardiovascular:  Regular rate and rhythm No murmurs, ectopy, or gallups. No lateral PMI. No thrills. Abdomen:  Abdomen is soft, non-tender, non-distended No hernias, masses, or organomegaly Normoactive bowel sounds.  Musculoskeletal:  No cyanosis, clubbing, or edema Skin:  No rashes, lesions, ulcers palpation of skin: no induration or nodules Neurologic:  CN 2-12 intact Sensation all 4 extremities intact Psychiatric:  Mental status Mood, affect appropriate Orientation to  person, place, time  judgment and insight appear intact  I have personally reviewed the following:   Today's Data  Vitals  Lab Data  CBC, BMP, Glucoses   Micro Data  Blood cultures x 2 - no growth Positive for influenza A Negative for COVID-19  Imaging  CXR   Cardiology Data  EKG Troponins  Scheduled Meds:  aspirin EC  81 mg Oral [id number]   atorvastatin  40 mg Oral Daily   azithromycin  500 mg Oral QHS   carvedilol  12.5 mg Oral Daily   Chlorhexidine Gluconate Cloth  6 each Topical Daily   digoxin  125 mcg Oral Daily   enoxaparin (LOVENOX) injection  40 mg Subcutaneous Q24H   insulin aspart  0-9 Units Subcutaneous TID AC & HS   insulin glargine-yfgn  30 Units Subcutaneous QHS   mouth rinse  15 mL Mouth Rinse BID   mometasone-formoterol  2 puff Inhalation BID   oseltamivir  75 mg Oral BID   potassium chloride  10 mEq Oral Daily   sacubitril-valsartan  1 tablet Oral BID   spironolactone  12.5 mg Oral Daily   torsemide  20 mg Oral Daily   Continuous Infusions:  sodium chloride Stopped (09/14/21 0238)   cefTRIAXone (ROCEPHIN)  IV Stopped (09/14/21 2059)    Principal Problem:   Influenza A Active Problems:   Multifocal pneumonia   Chronic systolic CHF (congestive heart failure) (HCC)   Prostate cancer (HCC)   CAD (coronary artery disease)   Implantable cardioverter-defibrillator (ICD) in situ   Obstructive sleep apnea on CPAP   Severe sepsis (HCC)   Hyperglycemia due to type 1 diabetes mellitus (HCC)   Diabetes mellitus without complication (HCC)   Acute respiratory failure with hypoxia (HCC)   AKI (acute kidney injury) ([place])   Elevated troponin   LOS: 4 days    A & P  Influenza A with superimposed bacterial infection pneumonia    Severe sepsis (HCC) -Sepsis criteria includes fever, tachycardia, leukocytosis, lactic acid 2.1 hypoxia, AKI  - Tamiflu, Rocephin and azithromycin - Sepsis fluids with close monitoring for fluid overload given systolic heart  failure - Symptomatic treatment - patient is currently requiring 3L to maintain saturations of 97%.  Acute respiratory failure with hypoxia - O2 sats 87% on room air with increased work of breathing - Supplemental oxygen to keep sats over 14%   Chronic systolic CHF  Ischemic cardiomyopathy S/p AICD - Euvolemic but high risk of fluid overload with sepsis fluids - Daily weights - Continue CHF meds, carvedilol, torsemide, digoxin and Entresto pending verification     DKA due to DM I - Resolved. Endotool off. Patient transferred to floor  - DM I: Glucoses are high today in the 300's. Will increase lantus to 30 units daily with SSI. Monitor.     AKI (acute kidney  injury) ([place]) - Creatinine baseline of 0.88. 2.63 on admission. 1.78 this morning. -Continue IV fluids cautiously. - Monitor creatinine, electrolytes, and volume status   Elevated troponin CAD with history of MI - Troponin showing non-ACS trend and EKG nonacute - Continue to monitor - Continue carvedilol, atorvastatin and aspirin     Prostate cancer s/p prostatectomy - No acute issues     Obstructive sleep apnea on CPAP - CPAP if desired  I have seen and examined this patient myself. I have spent 32 minutes in his evaluation and care.   DVT prophylaxis: Lovenox  Code Status: full code  Family Communication: Wife at bedside Disposition Plan: Back to previous home environment Consults called: none  Status: Inpatient.   Emiko Osorto, DO [facility] Direct contact: see [email or web address]  7PM-7AM contact night coverage as above 09/15/2021, 4:08 PM  LOS: 1 day

## 2021-09-15 NOTE — Progress Notes (Signed)
Inpatient Diabetes Program Recommendations  AACE/ADA: New Consensus Statement on Inpatient Glycemic Control   Target Ranges:  Prepandial:   less than 140 mg/dL      Peak postprandial:   less than 180 mg/dL (1-2 hours)      Critically ill patients:  140 - 180 mg/dL    Latest Reference Range & Units 09/14/21 08:18 09/14/21 11:50 09/14/21 16:55 09/14/21 21:15 09/15/21 07:31  Glucose-Capillary 70 - 99 mg/dL 291 (H) 280 (H) 228 (H) 208 (H) 232 (H)   Review of Glycemic Control  Diabetes history: DM Outpatient Diabetes medications: Tresiba 50 units daily, Humalog 18 units with breakfast, 20 units with lunch, 22 units with supper Current orders for Inpatient glycemic control: Semglee 30 units QHS, Novolog 0-9 units AC&HS  Inpatient Diabetes Program Recommendations:    Insulin: Please consider increasing Semglee to 35 units QHS and ordering Novolog 5 units TID with meals for meal coverage if patient eats at least 50% of meals.  Thanks, Barnie Alderman, RN, MSN, CDE Diabetes Coordinator Inpatient Diabetes Program [phone] (Team Pager from 8am to 5pm)

## 2021-09-15 NOTE — Progress Notes (Signed)
Patient received from ICU via bed. Patient is awake, alert and oriented x 4.  Denies any complaint at this time.  Coughing up small amount of phlegm every now and then, cough reflex strong.  Oriented to room and unit routine, call bell within reach.  Needs addressed.

## 2021-09-15 NOTE — TOC Initial Note (Signed)
Transition of Care [facility]) - Initial/Assessment Note    Patient Details  Name: Joseph [name] MRN: [id number] Date of Birth: [birth year]  [facility]) CM/SW Contact:    Shelbie Hutching, RN Phone Number: 09/15/2021, 10:48 AM  Clinical Narrative:                 Patient admitted to the hospital with influenza, patient has a history of CHF.  Currently on acute O2 at 3 L [place].  RNCM met with patient at the bedside.  He is from home with his girlfriend.  He is independent at home and works full time.  Patient has reliable transportation, he is current with his PCP and [facility] [place].   No TOC needs identified at this time.   Expected Discharge Plan: Home/Self Care Barriers to Discharge: Continued Medical Work up   Patient Goals and CMS Choice Patient states their goals for this hospitalization and ongoing recovery are:: to get better and get back home      Expected Discharge Plan and Services Expected Discharge Plan: Home/Self Care       Living arrangements for the past 2 months: Single Family Home                 DME Arranged: N/A DME Agency: NA       HH Arranged: NA [place] Agency: NA        Prior Living Arrangements/Services Living arrangements for the past 2 months: Single Family Home Lives with:: Significant Other Patient language and need for interpreter reviewed:: Yes Do you feel safe going back to the place where you live?: Yes      Need for Family Participation in Patient Care: Yes (Comment) Care giver support system in place?: Yes (comment) (mother and girlfriend)   Criminal Activity/Legal Involvement Pertinent to Current Situation/Hospitalization: No - Comment as needed  Activities of Daily Living Home Assistive Devices/Equipment: None ADL Screening (condition at time of admission) Patient's cognitive ability adequate to safely complete daily activities?: Yes Is the patient deaf or have difficulty hearing?: No Does the  patient have difficulty seeing, even when wearing glasses/contacts?: No Does the patient have difficulty concentrating, remembering, or making decisions?: No Patient able to express need for assistance with ADLs?: Yes Does the patient have difficulty dressing or bathing?: No Independently performs ADLs?: Yes (appropriate for developmental age) Does the patient have difficulty walking or climbing stairs?: No Weakness of Legs: None Weakness of Arms/Hands: None  Permission Sought/Granted Permission sought to share information with : Case Manager, Family Supports Permission granted to share information with : Yes, Verbal Permission Granted  Share Information with NAME: Joseph [name] and Joseph [name]     Permission granted to share info w Relationship: mother and girlfriend  Permission granted to share info w Contact Information: [phone] and [phone]  Emotional Assessment Appearance:: Appears stated age Attitude/Demeanor/Rapport: Engaged Affect (typically observed): Accepting Orientation: : Oriented to Self, Oriented to Place, Oriented to  Time, Oriented to Situation Alcohol / Substance Use: Not Applicable Psych Involvement: No (comment)  Admission diagnosis:  Influenza A [J10.1] Acute respiratory failure with hypoxia (HCC) [J96.01] Type 2 diabetes mellitus with hyperglycemia, unspecified whether long term insulin use (HCC) [E11.65] Sepsis with acute hypoxic respiratory failure without septic shock, due to unspecified organism ([place]) [A41.9, R65.20, J96.01] Patient Active Problem List   Diagnosis Date Noted   Acute respiratory failure with hypoxia ([place]) 09/11/2021   Influenza A 09/11/2021   AKI (acute  kidney injury) ([place]) 09/11/2021   Elevated troponin 09/11/2021   Pneumonia due to COVID-19 virus 09/19/2020   Diabetes mellitus without complication ([place])    Hyperglycemia due to type 1 diabetes mellitus ([place]) 07/17/2020   Diabetic foot ulcer associated with type 1 diabetes  mellitus ([place]) 07/17/2020   Cellulitis 07/14/2020   Severe sepsis ([place]) 07/14/2020   Cystitis    Scrotal pain    CAD (coronary artery disease) 01/12/2020   S/P prostatectomy 01/12/2020   Severe asthma without complication 94/76/5465   UTI (urinary tract infection) 01/12/2020   Possible Postprocedural intraabdominal abscess 01/12/2020   Prostate cancer ([place]) 01/03/2020   Preop cardiovascular exam 10/18/2019   Abdominal bloating 04/12/2019   Chronic diarrhea 04/12/2019   Gastritis and duodenitis 04/12/2019   Dyskinesia of gallbladder 11/07/2017   Elevated PSA 07/25/2017   Erectile dysfunction associated with type 2 diabetes mellitus ([place]) 07/25/2017   Hypertension 03/01/2017   Obstructive sleep apnea on CPAP 03/54/6568   Chronic systolic CHF (congestive heart failure) ([place]) 11/24/2016   Shortness of breath    Abnormal EKG    DKA (diabetic ketoacidoses) 04/18/2016   Multifocal pneumonia 04/18/2016   Accelerated hypertension 04/18/2016   Abdominal pain 04/18/2016   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[place]) 08/19/2015   Poorly controlled type 1 diabetes mellitus ([place]) 08/07/2015   Required emergent intubation 03/07/2014   LADA (latent autoimmune diabetes in adults), managed as type 1 ([place]) 09/15/2011   GERD without esophagitis 08/18/2011   Hyperlipemia 08/18/2011   Implantable cardioverter-defibrillator (ICD) in situ 08/18/2011   Ischemic cardiomyopathy 08/18/2011   MI (myocardial infarction) ([place]) 03/11/2010   PCP:  [facility] [place]:   [id number] [place] [address] [id number]: [phone]: [phone]    Social Determinants of Health (SDOH) Interventions    Readmission Risk Interventions Readmission Risk Prevention Plan 09/15/2021 09/22/2020  Transportation Screening Complete -  PCP or Specialist Appt within 3-5 Days Complete Complete  [place] or [place] Complete  Social Work Consult for  [place]/Counseling Complete Complete  Palliative Care Screening Not Applicable Not Applicable  Medication Review (Press photographer Complete Complete  Some recent data might be hidden

## 2021-09-15 NOTE — Progress Notes (Signed)
Pt transferred to [facility] room 208 via bed, monitor, and oxygen at [facility].  Pt in no distress or c/o of SOB.Marland Kitchen  POC continued and report given to RN on 2C..  All questions addressed.

## 2021-09-16 LAB — GLUCOSE, CAPILLARY
Glucose-Capillary: 231 mg/dL — ABNORMAL HIGH (ref 70–99)
Glucose-Capillary: 268 mg/dL — ABNORMAL HIGH (ref 70–99)
Glucose-Capillary: 276 mg/dL — ABNORMAL HIGH (ref 70–99)
Glucose-Capillary: 232 mg/dL — ABNORMAL HIGH (ref 70–99)

## 2021-09-16 LAB — BASIC METABOLIC PANEL
Anion gap: 6 (ref 5–15)
BUN: 25 mg/dL — ABNORMAL HIGH (ref 6–20)
CO2: 27 mmol/L (ref 22–32)
Calcium: 7.8 mg/dL — ABNORMAL LOW (ref 8.9–10.3)
Chloride: 104 mmol/L (ref 98–111)
Creatinine, Ser: 0.98 mg/dL (ref 0.61–1.24)
GFR, Estimated: >60 mL/min (ref >60)
Glucose, Bld: 238 mg/dL — ABNORMAL HIGH (ref 70–99)
Potassium: 3.1 mmol/L — ABNORMAL LOW (ref 3.5–5.1)
Sodium: 137 mmol/L (ref 135–145)

## 2021-09-16 LAB — CULTURE, BLOOD (ROUTINE X 2)
Culture: NO GROWTH
Culture: NO GROWTH

## 2021-09-16 MED ORDER — POTASSIUM CHLORIDE CRYS ER 20 MEQ PO TBCR
80.0000 meq | EXTENDED_RELEASE_TABLET | Freq: Once | ORAL | Status: AC
  Administered 2021-09-16: 80 meq via ORAL
  Filled 2021-09-16: qty 4

## 2021-09-16 MED ORDER — INSULIN ASPART 100 UNIT/ML IJ SOLN
5.0000 [IU] | Freq: Three times a day (TID) | INTRAMUSCULAR | Status: DC
  Administered 2021-09-16 – 2021-09-17 (×4): 5 [IU] via SUBCUTANEOUS
  Filled 2021-09-16 (×4): qty 1

## 2021-09-16 MED ORDER — ALUM & MAG HYDROXIDE-SIMETH 200-200-20 MG/5ML PO SUSP
30.0000 mL | ORAL | Status: DC | PRN
  Administered 2021-09-16: 30 mL via ORAL
  Filled 2021-09-16: qty 30

## 2021-09-16 MED ORDER — INSULIN GLARGINE-YFGN 100 UNIT/ML ~~LOC~~ SOLN
35.0000 [IU] | Freq: Every day | SUBCUTANEOUS | Status: DC
  Administered 2021-09-16: 35 [IU] via SUBCUTANEOUS
  Filled 2021-09-16 (×2): qty 0.35

## 2021-09-16 NOTE — Progress Notes (Signed)
PROGRESS NOTE  Joseph [name] WCB:[id number] DOB: March 19, 1971 DOA: 09/11/2021 PCP: [facility], [place]  Brief History    Joseph [name] is a 50 y.o. male with medical history significant for Type I DM, HTN, CAD with history of MI, ischemic cardiomyopathy, systolic CHF s/p AICD, HTN, prostate cancer s/p prostatectomy 12/2019 who presents to the ED with a 1 week history of flulike symptoms with cough, generalized malaise, muscle aches and fever with worsening symptoms over the past couple days with cough productive of thick yellow phlegm.  He denies chest pain.  Denies nausea, vomiting, abdominal pain or diarrhea   ED course: T-max 103, tachycardic to 130 and tachypneic to 20, BP 160/112.  Hypoxic to 87% in the ED requiring 2 L to maintain sats in the mid 90s Blood work with WBC 19,000, procalcitonin 8.87 creatinine 1.66 above baseline of 0.86.  Blood sugar 535 Troponin 26-22   Influenza A+   EKG: Sinus tachycardia at 130 with nonspecific ST-T wave changes   Imaging: Chest x-ray suggestive of multifocal pneumonia and interstitial pneumonitis   Patient started on azithromycin and ceftriaxone and tamiflu.  Hospitalist consulted for admission.   Into the next morning the patient's glucoses remained extremely high to the 570's. Beta hydroxybutyric acid was positive. The patient was admitted to the step down unit on an endotool for DKA.  The patient's DKA resolved during the day. IV insulin was discontinued, diet was started and here was placed on subcutaneous insulin.   Consultants  PCCM  Procedures  None  Antibiotics   Anti-infectives (From admission, onward)    Start     Dose/Rate Route Frequency Ordered Stop   09/14/21 2200  azithromycin (ZITHROMAX) tablet 500 mg        500 mg Oral Daily at bedtime 09/14/21 1327 09/15/21 2140   09/11/21 2200  oseltamivir (TAMIFLU) capsule 75 mg        75 mg Oral 2 times daily 09/11/21 2153 09/16/21 2159   09/11/21 2200  cefTRIAXone  (ROCEPHIN) 2 g in sodium chloride 0.9 % 100 mL IVPB  Status:  Discontinued        2 g 200 mL/hr over 30 Minutes Intravenous Every 24 hours 09/11/21 2156 09/11/21 2159   09/11/21 2200  azithromycin (ZITHROMAX) 500 mg in sodium chloride 0.9 % 250 mL IVPB  Status:  Discontinued        500 mg 250 mL/hr over 60 Minutes Intravenous Every 24 hours 09/11/21 2156 09/11/21 2200   09/11/21 2100  cefTRIAXone (ROCEPHIN) 2 g in sodium chloride 0.9 % 100 mL IVPB        2 g 200 mL/hr over 30 Minutes Intravenous Every 24 hours 09/11/21 2050 09/15/21 2233   09/11/21 2100  azithromycin (ZITHROMAX) 500 mg in sodium chloride 0.9 % 250 mL IVPB  Status:  Discontinued        500 mg 250 mL/hr over 60 Minutes Intravenous Every 24 hours 09/11/21 2050 09/14/21 1327       Interval History/Subjective  The patient is lying quietly. He states that he is feeling better today. No new complaints.  Objective   Vitals:  Vitals:   09/16/21 0600 09/16/21 0807  BP:  138/70  Pulse:  (!) 101  Resp:  20  Temp: 99.1 F (37.3 C) 99.8 F (37.7 C)  SpO2:  92%    Exam:  Constitutional:  The patient is awake, alert, and oriented x 3. No acute distress. Respiratory:  No increased work of breathing Scattered  exp wheeze Positive for coarse rales. Improved air entry. Cardiovascular:  Regular rate and rhythm No murmurs, ectopy, or gallups. No lateral PMI. No thrills. Abdomen:  Abdomen is soft, non-tender, non-distended No hernias, masses, or organomegaly Normoactive bowel sounds.  Musculoskeletal:  No cyanosis, clubbing, or edema Skin:  No rashes, lesions, ulcers palpation of skin: no induration or nodules Neurologic:  Moving all 4 extremties Psychiatric:  Mental status Mood, affect appropriate Orientation to person, place, time  judgment and insight appear intact  I have personally reviewed the following:   Today's Data  Vitals  Lab Data  CBC, BMP, Glucoses   Micro Data  Blood cultures x 2 - no  growth Positive for influenza A Negative for COVID-19  Imaging  CXR   Cardiology Data  EKG Troponins  Scheduled Meds:  aspirin EC  81 mg Oral BH-q7a   atorvastatin  40 mg Oral Daily   carvedilol  12.5 mg Oral Daily   Chlorhexidine Gluconate Cloth  6 each Topical Daily   digoxin  125 mcg Oral Daily   enoxaparin (LOVENOX) injection  40 mg Subcutaneous Q24H   insulin aspart  0-9 Units Subcutaneous TID AC & HS   insulin glargine-yfgn  30 Units Subcutaneous QHS   mouth rinse  15 mL Mouth Rinse BID   mometasone-formoterol  2 puff Inhalation BID   oseltamivir  75 mg Oral BID   potassium chloride  10 mEq Oral Daily   potassium chloride  80 mEq Oral Once   sacubitril-valsartan  1 tablet Oral BID   spironolactone  12.5 mg Oral Daily   torsemide  20 mg Oral Daily   Continuous Infusions:  sodium chloride Stopped (09/14/21 0238)    Principal Problem:   Influenza A Active Problems:   Multifocal pneumonia   Chronic systolic CHF (congestive heart failure) (HCC)   Prostate cancer (HCC)   CAD (coronary artery disease)   Implantable cardioverter-defibrillator (ICD) in situ   Obstructive sleep apnea on CPAP   Severe sepsis (HCC)   Hyperglycemia due to type 1 diabetes mellitus (HCC)   Diabetes mellitus without complication (HCC)   Acute respiratory failure with hypoxia (HCC)   AKI (acute kidney injury) ([place])   Elevated troponin   LOS: 5 days    A & P  Influenza A with superimposed bacterial infection pneumonia  Severe sepsis (HCC) Asthma -Sepsis criteria includes fever, tachycardia, leukocytosis, lactic acid 2.1 hypoxia, AKI. Sepsis physiology resolved - Tamiflu, Rocephin and azithromycin. Finishes tamiflu today and will finish 5 days abx tonight - has been weaned off o2 - pt consult, will also need to ambulate patient to eval ambulatory o2. - duonebs prn - has not received steroids. Given improvement and sig hyperglycemia will hold on starting  Acute respiratory failure  with hypoxia - O2 sats 87% on room air with increased work of breathing - Supplemental oxygen to keep sats over 10%   Chronic systolic CHF  Ischemic cardiomyopathy S/p AICD - Euvolemic but high risk of fluid overload with sepsis fluids - Daily weights - Continue CHF meds, carvedilol, torsemide, digoxin and Entresto      DKA due to DM I - Resolved. Endotool off. Patient transferred to floor  - DM I: Glucoses are high today in the 200s. Will increase lantus to 35units daily with SSI and add meal-time 5. Appreciated dm educator input. Monitor.     AKI (acute kidney injury) ([place]) - resolved w/ fluid   Elevated troponin CAD with history of MI - Troponin showing  non-ACS trend and EKG nonacute - Continue to monitor - Continue carvedilol, atorvastatin and aspirin     Prostate cancer s/p prostatectomy - No acute issues  Hypokalemia K 3.1, likely 2/2 diuretics - 80 po once - f/u mg     Obstructive sleep apnea on CPAP - CPAP if desired (doesn't use at home)  I have seen and examined this patient myself. I have spent 35 minutes in his evaluation and care.   DVT prophylaxis: Lovenox  Code Status: full code  Family Communication: none @ bedside Disposition Plan: Back to previous home environment pending walking O2 eval and pt eval Consults called: none  Status: Inpatient.   Laurey Arrow, MD [facility] Direct contact: see [email or web address]  7PM-7AM contact night coverage as above

## 2021-09-16 NOTE — Plan of Care (Signed)
  Problem: Clinical Measurements: Goal: Ability to maintain clinical measurements within normal limits will improve Outcome: Progressing Goal: Will remain free from infection Outcome: Progressing Goal: Diagnostic test results will improve Outcome: Progressing Goal: Respiratory complications will improve Outcome: Progressing   Problem: Pain Managment: Goal: General experience of comfort will improve Outcome: Progressing   Pt is involved in and agrees with the plan of care. V/S stable except fever at 37.9; tylenol given. Appears fatigue. Robitussin given for cough. Pt on RA with oxygen sats at 91%.

## 2021-09-16 NOTE — Progress Notes (Signed)
SATURATION QUALIFICATIONS: (This note is used to comply with regulatory documentation for home oxygen)  Patient Saturations on Room Air at Rest = 92%  Patient Saturations on Room Air while Ambulating = 86%  Patient Saturations on 2 Liters of oxygen while Ambulating = 92-94%

## 2021-09-17 LAB — BASIC METABOLIC PANEL
Anion gap: 9 (ref 5–15)
BUN: 21 mg/dL — ABNORMAL HIGH (ref 6–20)
CO2: 26 mmol/L (ref 22–32)
Calcium: 8 mg/dL — ABNORMAL LOW (ref 8.9–10.3)
Chloride: 105 mmol/L (ref 98–111)
Creatinine, Ser: 0.8 mg/dL (ref 0.61–1.24)
GFR, Estimated: >60 mL/min (ref >60)
Glucose, Bld: 183 mg/dL — ABNORMAL HIGH (ref 70–99)
Potassium: 3.4 mmol/L — ABNORMAL LOW (ref 3.5–5.1)
Sodium: 140 mmol/L (ref 135–145)

## 2021-09-17 LAB — GLUCOSE, CAPILLARY
Glucose-Capillary: 164 mg/dL — ABNORMAL HIGH (ref 70–99)
Glucose-Capillary: 183 mg/dL — ABNORMAL HIGH (ref 70–99)
Glucose-Capillary: 199 mg/dL — ABNORMAL HIGH (ref 70–99)

## 2021-09-17 LAB — MAGNESIUM: Magnesium: 1.8 mg/dL (ref 1.7–2.4)

## 2021-09-17 MED ORDER — MAGNESIUM SULFATE IN D5W 1-5 GM/100ML-% IV SOLN
1.0000 g | Freq: Once | INTRAVENOUS | Status: AC
  Administered 2021-09-17: 1 g via INTRAVENOUS
  Filled 2021-09-17: qty 100

## 2021-09-17 MED ORDER — FUROSEMIDE 10 MG/ML IJ SOLN
40.0000 mg | Freq: Once | INTRAMUSCULAR | Status: AC
  Administered 2021-09-17: 40 mg via INTRAVENOUS
  Filled 2021-09-17: qty 4

## 2021-09-17 MED ORDER — POTASSIUM CHLORIDE CRYS ER 20 MEQ PO TBCR
80.0000 meq | EXTENDED_RELEASE_TABLET | Freq: Once | ORAL | Status: AC
  Administered 2021-09-17: 80 meq via ORAL
  Filled 2021-09-17: qty 4

## 2021-09-17 NOTE — Evaluation (Signed)
Physical Therapy Evaluation Patient Details Name: Joseph [name] MRN: [id number] DOB: Nov 08, 1970 Today's Date: 09/17/2021  History of Present Illness  Joseph [name] is a 50 y.o. male with medical history significant for Type I DM, HTN, CAD with history of MI, ischemic cardiomyopathy, systolic CHF s/p AICD, HTN, prostate cancer s/p prostatectomy 12/2019 who presents to the ED with a 1 week history of flulike symptoms with cough, generalized malaise, muscle aches and fever with worsening symptoms over the past couple days with cough productive of thick yellow phlegm.  He denies chest pain.  Denies nausea, vomiting, abdominal pain or diarrhea  Clinical Impression  Pt is a pleasant 50 year old male who was admitted for flu and pneumonia. Pt performs bed mobility with mod I, transfers with cga, and ambulation with cga and RW. Pt demonstrates deficits with endurance/mobility/strength. Would benefit from skilled PT to address above deficits and promote optimal return to PLOF. Recommend transition to [place] upon discharge from acute hospitalization.  SaO2 on room air at rest = 93% SaO2 on room air while ambulating = 89% SaO2 on 0 liters of O2 while ambulating = 89%, returned to 95% once seated       Recommendations for follow up therapy are one component of a multi-disciplinary discharge planning process, led by the attending physician.  Recommendations may be updated based on patient status, additional functional criteria and insurance authorization.  Follow Up Recommendations Home health PT    Assistance Recommended at Discharge None  Functional Status Assessment Patient has had a recent decline in their functional status and demonstrates the ability to make significant improvements in function in a reasonable and predictable amount of time.  Equipment Recommendations  Rolling walker (2 wheels)    Recommendations for Other Services       Precautions / Restrictions Precautions Precautions:  None Restrictions Weight Bearing Restrictions: No      Mobility  Bed Mobility Overal bed mobility: Modified Independent             General bed mobility comments: slow technique, however able to perform mobility without assist. Once seated, forward flexed posture    Transfers Overall transfer level: Needs assistance Equipment used: Rolling walker (2 wheels) Transfers: Sit to/from Stand Sit to Stand: Min guard           General transfer comment: needs cues for hand placement. Once standing, upright posture noted. RW used per request    Ambulation/Gait Ambulation/Gait assistance: Min guard Gait Distance (Feet): 80 Feet Assistive device: Rolling walker (2 wheels) Gait Pattern/deviations: Step-through pattern       General Gait Details: slow speed with quick fatigue, requesting to return back to room. Reports 10/10 on RPE post exertion. O2 sats monitored throughout  Stairs            Wheelchair Mobility    Modified Rankin (Stroke Patients Only)       Balance Overall balance assessment: Mild deficits observed, not formally tested                                           Pertinent Vitals/Pain Pain Assessment: No/denies pain    Home Living Family/patient expects to be discharged to:: Private residence Living Arrangements: Spouse/significant other Available Help at Discharge: Family (lives with girlfriend) Type of Home: House Home Access: Level entry       Home Layout: One level Home  Equipment: None      Prior Function Prior Level of Function : Independent/Modified Independent;Working/employed;Driving             Mobility Comments: inep prior, works as Games developer, stands for job. No recent falls       Hand Dominance        Extremity/Trunk Assessment   Upper Extremity Assessment Upper Extremity Assessment: Generalized weakness (B UE grossly 4/5)    Lower Extremity Assessment Lower Extremity Assessment:  Generalized weakness (B LE grossly 3+/5)       Communication   Communication: No difficulties  Cognition Arousal/Alertness: Awake/alert Behavior During Therapy: Flat affect Overall Cognitive Status: Within Functional Limits for tasks assessed                                          General Comments      Exercises Other Exercises Other Exercises: supine ther-ex performed on B LE including AP, SAQ, and heel slides x 10 reps. Supervision given. Fatigues quickly   Assessment/Plan    PT Assessment Patient needs continued PT services  PT Problem List Decreased strength;Decreased activity tolerance;Decreased mobility;Decreased knowledge of use of DME;Cardiopulmonary status limiting activity       PT Treatment Interventions Gait training;Therapeutic exercise;Balance training    PT Goals (Current goals can be found in the Care Plan section)  Acute Rehab PT Goals Patient Stated Goal: to go back to work PT Goal Formulation: With patient Time For Goal Achievement: 10/01/21 Potential to Achieve Goals: Good    Frequency Min 2X/week   Barriers to discharge        Co-evaluation               AM-PAC PT "6 Clicks" Mobility  Outcome Measure Help needed turning from your back to your side while in a flat bed without using bedrails?: None Help needed moving from lying on your back to sitting on the side of a flat bed without using bedrails?: None Help needed moving to and from a bed to a chair (including a wheelchair)?: A Little Help needed standing up from a chair using your arms (e.g., wheelchair or bedside chair)?: A Little Help needed to walk in hospital room?: A Little Help needed climbing 3-5 steps with a railing? : A Little 6 Click Score: 20    End of Session Equipment Utilized During Treatment: Gait belt Activity Tolerance: Patient tolerated treatment well Patient left: in chair Nurse Communication: Mobility status PT Visit Diagnosis: Muscle  weakness (generalized) (M62.81);Difficulty in walking, not elsewhere classified (R26.2)    Time: 8338-2505 PT Time Calculation (min) (ACUTE ONLY): 36 min   Charges:   PT Evaluation $PT Eval Low Complexity: 1 Low PT Treatments $Gait Training: 8-22 mins $Therapeutic Exercise: 8-22 mins        Joseph [name], PT, DPT [phone]   Joseph [name] Mangels 09/17/2021, 1:06 PM

## 2021-09-17 NOTE — TOC Transition Note (Signed)
Transition of Care [facility]) - CM/SW Discharge Note   Patient Details  Name: BARNETT ELZEY MRN: [id number] Date of Birth: [birth year]  [facility]) CM/SW Contact:  Candie Chroman, LCSW Phone Number: 09/17/2021, 3:55 PM   Clinical Narrative: Patient has orders to discharge home today. [facility] representative will call patient once insurance has processed to let him know if they can accept or not. Patient agreeable to outpatient PT if he cannot get home health. Prefers [employer] Faxed order form. No further concerns. CSW signing off.    Final next level of care: [place] (vs outpatient PT) Barriers to Discharge: Barriers Resolved   Patient Goals and CMS Choice Patient states their goals for this hospitalization and ongoing recovery are:: to get better and get back home   Choice offered to / list presented to : Patient  Discharge Placement                    Patient and family notified of of transfer: 09/17/21  Discharge Plan and Services                DME Arranged: Gilford Rile rolling DME Agency: [employer] Date DME Agency Contacted: 09/17/21   Representative spoke with at DME Agency: [place]: NA [place] Agency: NA        Social Determinants of Health ([place]) Interventions     Readmission Risk Interventions Readmission Risk Prevention Plan 09/15/2021 09/22/2020  Transportation Screening Complete -  PCP or Specialist Appt within 3-5 Days Complete Complete  HRI or [place] Complete Complete  Social Work Consult for [place] Planning/Counseling Complete Complete  Palliative Care Screening Not Applicable Not Applicable  Medication Review Press photographer) Complete Complete  Some recent data might be hidden

## 2021-09-17 NOTE — TOC Progression Note (Addendum)
Transition of Care [facility]) - Progression Note    Patient Details  Name: Joseph [name] MRN: [id number] Date of Birth: 30-Dec-1970  Transition of Care [facility]) CM/SW [place], LCSW Phone Number: 09/17/2021, 12:10 PM  Clinical Narrative:   Per PT, going to recommend home health and a walker. Patient is agreeable. Explained frequent difficulty in finding home health for commercial insurance plans and high copays but will call around to see if anyone is able to accept. [facility] is reviewing. Patient is agreeable to outpatient therapy if unable to get home health. PCP is at [facility] He most often sees Beverlyn Roux, MD.  1:11 pm: [facility] is still reviewing. [place], [place], [place], [place], [place], and [place] are unable to accept. [facility] has high copays per visit. [place] is reviewing. Left message for [facility] representative. Ordered walker through [facility].  2:55 pm: [facility] and [facility] unable to accept. [place] is running his insurance.  Expected Discharge Plan: Home/Self Care Barriers to Discharge: Continued Medical Work up  Expected Discharge Plan and Services Expected Discharge Plan: Home/Self Care       Living arrangements for the past 2 months: Single Family Home                 DME Arranged: N/A DME Agency: NA       HH Arranged: NA HH Agency: NA         Social Determinants of Health (SDOH) Interventions    Readmission Risk Interventions Readmission Risk Prevention Plan 09/15/2021 09/22/2020  Transportation Screening Complete -  PCP or Specialist Appt within 3-5 Days Complete Complete  HRI or [place] Complete Complete  Social Work Consult for [place] Planning/Counseling Complete Complete  Palliative Care Screening Not Applicable Not Applicable  Medication Review Press photographer) Complete Complete  Some recent data might be hidden

## 2021-09-17 NOTE — Plan of Care (Signed)
Patient still having some shortness of breath when ambulating, patient discharged with walker and PT to contact patient for out patient PT.  IV removed, discharge instructions reviewed, questions answered and patient discharged to home with mother.

## 2021-09-17 NOTE — Discharge Summary (Signed)
Joseph [name] EPP:[id number] DOB: [birth year] DOA: 09/11/2021  PCP: [facility] [place] date: 09/11/2021 Discharge date: 09/17/2021  Time spent: 45 minutes  Recommendations for Outpatient Follow-up:  Pcp f/u 1 week Heart failure clinic f/u 1 week Will need check of kidney function and potassium in 1 week     Discharge Diagnoses:  Principal Problem:   Influenza A Active Problems:   Multifocal pneumonia   Chronic systolic CHF (congestive heart failure) (HCC)   Prostate cancer ([place])   CAD (coronary artery disease)   Implantable cardioverter-defibrillator (ICD) in situ   Obstructive sleep apnea on CPAP   Severe sepsis ([place])   Hyperglycemia due to type 1 diabetes mellitus ([place])   Diabetes mellitus without complication ([place])   Acute respiratory failure with hypoxia (HCC)   AKI (acute kidney injury) ([place])   Elevated troponin   Discharge Condition: stable  Diet recommendation: low sodium heart healthy  Filed Weights   09/11/21 1423 09/12/21 2046 09/13/21 0600  Weight: 102.1 kg 94.8 kg 94.3 kg    History of present illness:  Joseph [name] is a 50 y.o. male with medical history significant for Type I DM, HTN, CAD with history of MI, ischemic cardiomyopathy, systolic CHF s/p AICD, HTN, prostate cancer s/p prostatectomy 12/2019 who presents to the ED with a 1 week history of flulike symptoms with cough, generalized malaise, muscle aches and fever with worsening symptoms over the past couple days with cough productive of thick yellow phlegm.  He denies chest pain.  Denies nausea, vomiting, abdominal pain or diarrhea  Hospital Course:  Patient presented with acute hypoxic respiratory failure O2 87% room air secondary to influenza a. Was also treated with course ceftriaxone/azithromycin for possible superimposed bacterial pneumonia. Was given 5 days of tamiflu. Weaned off oxygen. Ambulated with PT day of discharge, o2 89% with that, declined home o2. Symptomatically improved  but deconditioned, PT advising HH PT which we have ordered as well as rolling walker. Here did develop DKA that resolved promptly with insulin and fluids. Has hx severe HFrEF; here euvolemic but underlying cardiac dysfunction obviously contributing to debility. Potassium also mildly low, will need attention to that at pcp f/u.   Procedures: none   Consultations: none  Discharge Exam: Vitals:   09/17/21 0804 09/17/21 1030  BP: 134/72   Pulse: 95   Resp: 16   Temp: 99.3 F (37.4 C)   SpO2: 95% 93%    Constitutional:  The patient is awake, alert, and oriented x 3. No acute distress. Respiratory:  No increased work of breathing Scattered exp wheeze Positive for coarse rales. Improved air entry. Cardiovascular:  Regular rate and rhythm No murmurs, ectopy, or gallups. No lateral PMI. No thrills. Abdomen:  Abdomen is soft, non-tender, non-distended No hernias, masses, or organomegaly Normoactive bowel sounds.  Musculoskeletal:  No cyanosis, clubbing, or edema Skin:  No rashes, lesions, ulcers palpation of skin: no induration or nodules Neurologic:  Moving all 4 extremties Psychiatric:  Mental status Mood, affect appropriate Orientation to person, place, time  judgment and insight appear intact  Discharge Instructions   Discharge Instructions     Diet - low sodium heart healthy   Complete by: As directed    Diet - low sodium heart healthy   Complete by: As directed    Increase activity slowly   Complete by: As directed    Increase activity slowly   Complete by: As directed       Allergies as of 09/17/2021  Reactions   Vancomycin Shortness Of Breath   Lisinopril Rash        Medication List     TAKE these medications    acetaminophen 500 MG tablet Commonly known as: TYLENOL Take 500 mg by mouth every 6 (six) hours as needed for fever.   albuterol 108 (90 Base) MCG/ACT inhaler Commonly known as: Proventil HFA Inhale 2 puffs into the lungs  every 4 (four) hours as needed for wheezing or shortness of breath.   aspirin EC 81 MG tablet Take 81 mg by mouth every morning.   atorvastatin 40 MG tablet Commonly known as: LIPITOR Take 40 mg by mouth daily.   B-D ULTRAFINE III SHORT PEN 31G X 8 MM Misc Generic drug: Insulin Pen Needle USE 5 TIMES DAILY AS  DIRECTED   carvedilol 12.5 MG tablet Commonly known as: COREG Take 12.5 mg by mouth daily.   Cholecalciferol 25 MCG (1000 UT) capsule Take 1,000 Units by mouth daily.   dicyclomine 10 MG capsule Commonly known as: BENTYL Take 1 capsule (10 mg total) by mouth 4 (four) times daily -  before meals and at bedtime for 5 days.   digoxin 0.125 MG tablet Commonly known as: LANOXIN Take 1 tablet (125 mcg total) by mouth daily.   fluticasone 50 MCG/ACT nasal spray Commonly known as: FLONASE Place 2 sprays into both nostrils daily as needed for allergies.   gentamicin cream 0.1 % Commonly known as: GARAMYCIN Apply 1 application topically 2 (two) times daily.   glucagon (human recombinant) 1 MG injection Commonly known as: GLUCAGEN Inject into the muscle.   insulin aspart 100 UNIT/ML FlexPen Commonly known as: NOVOLOG Inject 18-20-22 u TID AC plus sliding scale for up to 75 units TDD   loratadine 10 MG tablet Commonly known as: CLARITIN Take 10 mg by mouth daily as needed.   naproxen sodium 220 MG tablet Commonly known as: ALEVE Take 220 mg by mouth daily as needed.   ondansetron 4 MG tablet Commonly known as: Zofran Take 1 tablet (4 mg total) by mouth every 8 (eight) hours as needed for up to 10 doses for nausea or vomiting.   potassium chloride 10 MEQ tablet Commonly known as: KLOR-CON M Take 10 mEq by mouth daily.   sacubitril-valsartan 97-103 MG Commonly known as: ENTRESTO Take 1 tablet by mouth 2 (two) times daily.   spironolactone 25 MG tablet Commonly known as: ALDACTONE Take 0.5 tablets (12.5 mg total) by mouth daily.   Symbicort 160-4.5 MCG/ACT  inhaler Generic drug: budesonide-formoterol Inhale 2 puffs into the lungs 2 (two) times daily.   torsemide 20 MG tablet Commonly known as: DEMADEX Take 2 tablets (40 mg total) by mouth daily.   Joseph [name] FlexTouch 200 UNIT/ML FlexTouch Pen Generic drug: insulin degludec Inject 50 Units into the skin daily.               Durable Medical Equipment  (From admission, onward)           Start     Ordered   09/17/21 1256  DME Walker  Once       Question Answer Comment  Walker: With 5 Inch Wheels   Patient needs a walker to treat with the following condition HFrEF (heart failure with reduced ejection fraction) ([place])      09/17/21 1256           Allergies  Allergen Reactions   Vancomycin Shortness Of Breath   Lisinopril Rash    Follow-up Information  [facility] Follow up.   Specialty: General Practice Contact information: [place] [address] [phone]         Alisa Graff, FNP Follow up.   Specialty: Family Medicine Why: on 12/14 as scheduled Contact information: [place] [address] [place] [address] [phone]                  The results of significant diagnostics from this hospitalization (including imaging, microbiology, ancillary and laboratory) are listed below for reference.    Significant Diagnostic Studies: DG Chest 2 View  Result Date: 09/11/2021 CLINICAL DATA:  Chest pain EXAM: CHEST - 2 VIEW COMPARISON:  Chest x-ray 09/05/2021 FINDINGS: Heart size is normal. Left-sided cardiac device appears unchanged. Prominent peribronchial opacities seen throughout both lungs, upper lobe predominant. Airspace consolidation in the left suprahilar region which is new since previous study. Smaller patchy airspace opacities in the left lower lung zone also appear new since previous. No pleural effusion or pneumothorax visualized. IMPRESSION: Findings suggestive of multifocal  pneumonia and interstitial pneumonitis. Electronically Signed   By: Ofilia Neas M.D.   On: 09/11/2021 15:03   DG Chest 2 View  Result Date: 09/05/2021 CLINICAL DATA:  50 year old male with history of shortness of breath. Epigastric pain. EXAM: CHEST - 2 VIEW COMPARISON:  Chest x-ray 05/09/2021. FINDINGS: Left-sided pacemaker/AICD with lead tip terminating in the right ventricular apex. Lung volumes are normal. No consolidative airspace disease. No pleural effusions. No pneumothorax. No pulmonary nodule or mass noted. Pulmonary vasculature and the cardiomediastinal silhouette are within normal limits. IMPRESSION: No radiographic evidence of acute cardiopulmonary disease. Electronically Signed   By: Vinnie Langton M.D.   On: 09/05/2021 07:51    Microbiology: Recent Results (from the past 240 hour(s))  Resp Panel by RT-PCR (Flu A&B, Covid) Nasopharyngeal Swab     Status: Abnormal   Collection Time: 09/11/21  2:45 PM   Specimen: Nasopharyngeal Swab; Nasopharyngeal(NP) swabs in vial transport medium  Result Value Ref Range Status   SARS Coronavirus 2 by RT PCR NEGATIVE NEGATIVE Final    Comment: (NOTE) SARS-CoV-2 target nucleic acids are NOT DETECTED.  The SARS-CoV-2 RNA is generally detectable in upper respiratory specimens during the acute phase of infection. The lowest concentration of SARS-CoV-2 viral copies this assay can detect is 138 copies/mL. A negative result does not preclude SARS-Cov-2 infection and should not be used as the sole basis for treatment or other patient management decisions. A negative result may occur with  improper specimen collection/handling, submission of specimen other than nasopharyngeal swab, presence of viral mutation(s) within the areas targeted by this assay, and inadequate number of viral copies(<138 copies/mL). A negative result must be combined with clinical observations, patient history, and epidemiological information. The expected result is  Negative.  Fact Sheet for Patients:  EntrepreneurPulse.com.au  Fact Sheet for Healthcare Providers:  IncredibleEmployment.be  This test is no t yet approved or cleared by the [place] FDA and  has been authorized for detection and/or diagnosis of SARS-CoV-2 by FDA under an Emergency Use Authorization (EUA). This EUA will remain  in effect (meaning this test can be used) for the duration of the COVID-19 declaration under Section 564(b)(1) of the Act, 21 U.S.C.section 360bbb-3(b)(1), unless the authorization is terminated  or revoked sooner.       Influenza A by PCR POSITIVE (A) NEGATIVE Final   Influenza B by PCR NEGATIVE NEGATIVE Final    Comment: (NOTE) The Xpert Xpress SARS-CoV-2/FLU/RSV  plus assay is intended as an aid in the diagnosis of influenza from Nasopharyngeal swab specimens and should not be used as a sole basis for treatment. Nasal washings and aspirates are unacceptable for Xpert Xpress SARS-CoV-2/FLU/RSV testing.  Fact Sheet for Patients: EntrepreneurPulse.com.au  Fact Sheet for Healthcare Providers: IncredibleEmployment.be  This test is not yet approved or cleared by the [place] FDA and has been authorized for detection and/or diagnosis of SARS-CoV-2 by FDA under an Emergency Use Authorization (EUA). This EUA will remain in effect (meaning this test can be used) for the duration of the COVID-19 declaration under Section 564(b)(1) of the Act, 21 U.S.C. section 360bbb-3(b)(1), unless the authorization is terminated or revoked.  Performed at [facility], [address] [place], [place] [address]   Culture, blood (routine x 2)     Status: None   Collection Time: 09/11/21  9:45 PM   Specimen: BLOOD  Result Value Ref Range Status   Specimen Description BLOOD LEFT ANTECUBITAL  Final   Special Requests   Final    BOTTLES DRAWN AEROBIC AND ANAEROBIC Blood Culture  results may not be optimal due to an inadequate volume of blood received in culture bottles   Culture   Final    NO GROWTH 5 DAYS Performed at [facility], [address] [place], [place] [address]    Report Status 09/16/2021 FINAL  Final  Culture, blood (routine x 2)     Status: None   Collection Time: 09/11/21 10:53 PM   Specimen: BLOOD  Result Value Ref Range Status   Specimen Description BLOOD BLOOD LEFT HAND  Final   Special Requests   Final    BOTTLES DRAWN AEROBIC AND ANAEROBIC Blood Culture results may not be optimal due to an inadequate volume of blood received in culture bottles   Culture   Final    NO GROWTH 5 DAYS Performed at [facility], [address], [place], [place] [address]    Report Status 09/16/2021 FINAL  Final  MRSA Next Gen by PCR, Nasal     Status: None   Collection Time: 09/13/21  6:15 AM   Specimen: Nasal Mucosa; Nasal Swab  Result Value Ref Range Status   MRSA by PCR Next Gen NOT DETECTED NOT DETECTED Final    Comment: (NOTE) The GeneXpert MRSA Assay (FDA approved for NASAL specimens only), is one component of a comprehensive MRSA colonization surveillance program. It is not intended to diagnose MRSA infection nor to guide or monitor treatment for MRSA infections. Test performance is not FDA approved in patients less than 25 years old. Performed at [facility], [place], [place] [address]   C Difficile Quick Screen w PCR reflex     Status: Abnormal   Collection Time: 09/13/21 11:00 AM   Specimen: STOOL  Result Value Ref Range Status   C Diff antigen POSITIVE (A) NEGATIVE Final   C Diff toxin NEGATIVE NEGATIVE Final   C Diff interpretation Results are indeterminate. See PCR results.  Final    Comment: Performed at [facility], [address] [place], [place] [address]  C. Diff by PCR, Reflexed     Status: None   Collection Time: 09/13/21 11:00 AM  Result Value Ref Range Status    Toxigenic C. Difficile by PCR NEGATIVE NEGATIVE Final    Comment: Patient is colonized with non toxigenic C. difficile. May not need treatment unless significant symptoms are present. Performed at [facility], [phone] [place], [place] [address]      Labs: Basic Metabolic Panel: Recent Labs  Lab 09/13/21 0437 09/14/21 0408 09/15/21 0430 09/16/21 0519 09/17/21 0544  NA 129* 135 138 137 140  K 3.3* 4.0 3.4* 3.1* 3.4*  CL 97* 103 104 104 105  CO2 25 25 26 27 26   GLUCOSE 205* 341* 253* 238* 183*  BUN 66* 41* 30* 25* 21*  CREATININE 1.78* 1.03 0.88 0.98 0.80  CALCIUM 7.6* 7.9* 7.8* 7.8* 8.0*  MG  --   --   --   --  1.8   Liver Function Tests: No results for input(s): AST, ALT, ALKPHOS, BILITOT, PROT, ALBUMIN in the last 168 hours. No results for input(s): LIPASE, AMYLASE in the last 168 hours. No results for input(s): AMMONIA in the last 168 hours. CBC: Recent Labs  Lab 09/11/21 1432 09/12/21 0758 09/13/21 0437 09/15/21 0430  WBC 18.9* 20.1* 18.7* 17.3*  NEUTROABS  --  16.3* 15.2* 13.3*  HGB 14.2 13.3 12.1* 11.1*  HCT 40.6 37.9* 36.3* 33.7*  MCV 87.7 86.3 88.8 91.1  PLT 269 272 248 288   Cardiac Enzymes: No results for input(s): CKTOTAL, CKMB, CKMBINDEX, TROPONINI in the last 168 hours. BNP: BNP (last 3 results) Recent Labs    05/08/21 0348 05/09/21 1019 09/05/21 0715  BNP 111.8* 105.7* 42.2    ProBNP (last 3 results) No results for input(s): PROBNP in the last 8760 hours.  CBG: Recent Labs  Lab 09/16/21 1139 09/16/21 1639 09/16/21 2046 09/17/21 0802 09/17/21 1149  GLUCAP 268* 276* 232* 164* 183*       Signed:  Desma Maxim MD.  [facility] 09/17/2021, 12:57 PM

## 2021-09-22 NOTE — Progress Notes (Signed)
Patient ID: Joseph [name], male    DOB: 11-05-1970, 50 y.o.   MRN: [id number]  HPI  Mr Erker is a 50 y/o male with a history of asthma, CAD, DM, GERD, hyperlipidemia, HTN, obstructive sleep apnea, seizures & chronic heart failure.   Echo report from 11/24/16 reviewed and showed an EF of 25-30% along with mild/moderate MR.   LHC on 11/23/16 showed: No high-grade coronary artery lesion to explain EKG changes and acute respiratory distress. There is moderate in-stent restenosis of the ostial/proximal LAD stent, as well as mild to moderate disease involving small jailed D1 and mid LAD. Severely reduced LV dysfunction with LVEF of approximately 25-30%. Mildly elevated left ventricular filling pressure, though evaluation is limited by significant respiratory variation.  Admitted 09/11/21 due to flu like symptoms. Hypoxic on room air due to influenza. Antibiotics given for presumed pneumonia. Able to be weaned off oxygen. PT eval done. Developed DKA which quickly resolved with IVF and insulin. Discharged after 6 days with home health PT. Was in the ED 09/05/21 due gastroenteritis where he was evaluated and released. Was in the ED 05/09/21 due to shortness of breath and headache. Given IV lasix with improvement of symptoms and he was released. Was in the ED 05/08/21 due to shortness of breath. Chest CTA done due to elevated d-dimer. Given IV lasix with improvement and subsequent increase of oral   He presents today for a follow-up visit with a chief complaint of moderate shortness of breath with very little exertion. He says this has been present for several years although has worsened greatly since his recent admission. He has associated fatigue, cough, wheezing, palpitations, difficulty sleeping (due to orthopnea), leg weakness, dizziness and weight loss along with this. He denies any abdominal distention, pedal edema or chest pain.   Got sick on Thanksgiving day with flu like symptoms and then got worse which  prompted recent admission.   Says that he walks around his house without his walker but when he's out, he uses the walker for stability because his balance is "off". He notes that even when he eats, he can feel short of breath. Has had PT evaluation since he's been home.   Past Medical History:  Diagnosis Date   AICD (automatic cardioverter/defibrillator) present 2011   Asthma    Cancer ([place])    prostate   CHF (congestive heart failure) ([place])    Coronary artery disease    Diabetes mellitus without complication ([place])    Dyspnea    ED (erectile dysfunction)    GERD (gastroesophageal reflux disease)    Hyperlipidemia    Hypertension    Ischemic cardiomyopathy    LADA (latent autoimmune diabetes in adults), managed as type 1 ([place])    Myocardial infarction ([place]) 2011   anterior MI at [place] s/p BMS to ostial/proximal LAD   Neuromuscular disorder ([place])    problems in arms. finger tips have been numb/tingling. voltaren works   Obstructive sleep apnea 2018   cpap. has not used lately. uses a breathe-right strip on nose   Seizures ([place])    diabetes seizures (low blood sugar); last one 77/4128   Systolic heart failure ([place])    Vitamin D deficiency    Past Surgical History:  Procedure Laterality Date   CARDIAC DEFIBRILLATOR PLACEMENT  08/30/2010   CHOLECYSTECTOMY N/A 11/07/2017   Procedure: LAPAROSCOPIC CHOLECYSTECTOMY;  Surgeon: Herbert Pun, MD;  Location: [facility] ORS;  Service: General;  Laterality: N/A;   CORONARY ANGIOPLASTY  2011  stent placed x 1   CYSTOSCOPY N/A 01/03/2020   Procedure: CYSTOSCOPY FLEXIBLE;  Surgeon: Hollice Espy, MD;  Location: [facility] ORS;  Service: Urology;  Laterality: N/A;   ESOPHAGOGASTRODUODENOSCOPY (EGD) WITH PROPOFOL N/A 03/20/2018   Procedure: ESOPHAGOGASTRODUODENOSCOPY (EGD) WITH PROPOFOL;  Surgeon: Manya Silvas, MD;  Location: [facility] ENDOSCOPY;  Service: Endoscopy;  Laterality: N/A;   LEFT HEART CATH AND CORONARY ANGIOGRAPHY N/A 11/23/2016    Procedure: Left Heart Cath and Coronary Angiography;  Surgeon: Nelva Bush, MD;  Location: [place] CV LAB;  Service: Cardiovascular;  Laterality: N/A;   PROSTATE BIOPSY  2019   PROSTATE BIOPSY N/A 10/23/2019   Procedure: PROSTATE BIOPSY;  Surgeon: Abbie Sons, MD;  Location: [facility] ORS;  Service: Urology;  Laterality: N/A;   ROBOT ASSISTED LAPAROSCOPIC RADICAL PROSTATECTOMY N/A 01/03/2020   Procedure: XI ROBOTIC ASSISTED LAPAROSCOPIC RADICAL PROSTATECTOMY;  Surgeon: Hollice Espy, MD;  Location: [facility] ORS;  Service: Urology;  Laterality: N/A;   TRANSRECTAL ULTRASOUND N/A 10/23/2019   Procedure: TRANSRECTAL ULTRASOUND;  Surgeon: Abbie Sons, MD;  Location: [facility] ORS;  Service: Urology;  Laterality: N/A;   WRIST SURGERY Right 2008   Family History  Problem Relation Age of Onset   Hypertension Mother    Heart attack Paternal Grandmother    Heart attack Father    Bladder Cancer Neg Hx    Kidney cancer Neg Hx    Prostate cancer Neg Hx    Social History   Tobacco Use   Smoking status: Never   Smokeless tobacco: Never  Substance Use Topics   Alcohol use: No   Allergies  Allergen Reactions   Vancomycin Shortness Of Breath   Lisinopril Rash   Prior to Admission medications   Medication Sig Start Date End Date Taking? Authorizing Provider  acetaminophen (TYLENOL) 500 MG tablet Take 500 mg by mouth every 6 (six) hours as needed for fever.   Yes [provider]  albuterol (PROVENTIL HFA) 108 (90 Base) MCG/ACT inhaler Inhale 2 puffs into the lungs every 4 (four) hours as needed for wheezing or shortness of breath. 10/08/18  Yes Carrie Mew, MD  aspirin EC 81 MG tablet Take 81 mg by mouth every morning.   Yes [provider]  atorvastatin (LIPITOR) 40 MG tablet Take 40 mg by mouth daily. 06/08/20  Yes [provider]  carvedilol (COREG) 12.5 MG tablet Take 12.5 mg by mouth daily. 05/28/20  Yes [provider]  Cholecalciferol 25 MCG  (1000 UT) capsule Take 1,000 Units by mouth daily.   Yes [provider]  digoxin (LANOXIN) 0.125 MG tablet Take 1 tablet (125 mcg total) by mouth daily. 01/15/20  Yes Lorella Nimrod, MD  fluticasone (FLONASE) 50 MCG/ACT nasal spray Place 2 sprays into both nostrils daily as needed for allergies.  01/26/16  Yes [provider]  gentamicin cream (GARAMYCIN) 0.1 % Apply 1 application topically 2 (two) times daily. 12/26/20  Yes Edrick Kins, DPM  glucagon, human recombinant, (GLUCAGEN) 1 MG injection Inject into the muscle. 02/07/18  Yes [provider]  insulin aspart (NOVOLOG) 100 UNIT/ML FlexPen Inject 18-20-22 u TID AC plus sliding scale for up to 75 units TDD 01/10/20  Yes [provider]  insulin degludec (TRESIBA FLEXTOUCH) 200 UNIT/ML FlexTouch Pen Inject 50 Units into the skin daily.  01/29/20  Yes [provider]  Insulin Pen Needle (B-D ULTRAFINE III SHORT PEN) 31G X 8 MM MISC USE 5 TIMES DAILY AS  DIRECTED 12/01/19  Yes Carrie Mew, MD  loratadine (CLARITIN) 10 MG tablet Take 10 mg by mouth daily as needed.    Yes [provider]  naproxen sodium (ALEVE) 220 MG tablet Take 220 mg by mouth daily as needed.   Yes [provider]  ondansetron (ZOFRAN) 4 MG tablet Take 1 tablet (4 mg total) by mouth every 8 (eight) hours as needed for up to 10 doses for nausea or vomiting. 09/05/21  Yes Lucrezia Starch, MD  potassium chloride (KLOR-CON) 10 MEQ tablet Take 10 mEq by mouth daily. 04/01/20  Yes [provider]  sacubitril-valsartan (ENTRESTO) 97-103 MG Take 1 tablet by mouth 2 (two) times daily. 05/13/21  Yes Darylene Price A, FNP  spironolactone (ALDACTONE) 25 MG tablet Take 0.5 tablets (12.5 mg total) by mouth daily. 01/15/20  Yes Lorella Nimrod, MD  SYMBICORT 160-4.5 MCG/ACT inhaler Inhale 2 puffs into the lungs 2 (two) times daily. 05/27/20  Yes [provider]  torsemide (DEMADEX) 20 MG tablet Take 2 tablets (40 mg total)  by mouth daily. 08/06/21 11/04/21 Yes Alisa Graff, FNP   Review of Systems  Constitutional:  Positive for appetite change (decreased) and fatigue.  HENT:  Negative for congestion, postnasal drip, sneezing and sore throat.   Eyes: Negative.   Respiratory:  Positive for cough (productive), shortness of breath (easily) and wheezing. Negative for chest tightness.   Cardiovascular:  Positive for palpitations (on occasion). Negative for chest pain and leg swelling.  Gastrointestinal:  Negative for abdominal distention and abdominal pain.  Endocrine: Negative.   Genitourinary: Negative.   Musculoskeletal:  Negative for back pain and neck pain.  Skin: Negative.   Allergic/Immunologic: Negative.   Neurological:  Positive for dizziness (when coughing) and weakness (legs). Negative for light-headedness.  Hematological:  Negative for adenopathy. Does not bruise/bleed easily.  Psychiatric/Behavioral:  Positive for sleep disturbance (sleeping in recliner due to shortness of breath). Negative for dysphoric mood. The patient is not nervous/anxious.    Vitals:   09/23/21 1221  BP: (!) 104/59  Pulse: (!) 117  Resp: 20  SpO2: 96%  Weight: 201 lb (91.2 kg)  Height: 6\' 1"  (1.854 m)   Wt Readings from Last 3 Encounters:  09/23/21 201 lb (91.2 kg)  09/13/21 207 lb 14.3 oz (94.3 kg)  09/05/21 225 lb (102.1 kg)   Lab Results  Component Value Date   CREATININE 0.80 09/17/2021   CREATININE 0.98 09/16/2021   CREATININE 0.88 09/15/2021   Physical Exam Vitals and nursing note reviewed.  Constitutional:      Appearance: Normal appearance.  HENT:     Head: Normocephalic and atraumatic.  Cardiovascular:     Rate and Rhythm: Regular rhythm. Tachycardia present.  Pulmonary:     Effort: Pulmonary effort is normal. No respiratory distress.     Breath sounds: No wheezing or rales.  Abdominal:     General: There is no distension.  Musculoskeletal:        General: No tenderness.     Cervical back:  Normal range of motion and neck supple.     Right lower leg: Edema (trace pitting) present.     Left lower leg: Edema (trace pitting) present.  Skin:    General: Skin is warm and dry.  Neurological:     General: No focal deficit present.     Mental Status: He is alert and oriented to person, place, and time.  Psychiatric:        Mood and Affect: Mood normal.  Behavior: Behavior normal.        Thought Content: Thought content normal.   Assessment & Plan:  1: Chronic heart failure with reduced ejection fraction- - NYHA class III - euvolemic today - weighing daily; reminded to call for an overnight weight gain of > 2 pounds or a weekly weight gain of > 5 pounds - weight down 24 pounds from last visit here 6 weeks ago - not adding salt to his food and has been trying to read food labels for sodium content;admits to decreased appetite and difficulty eating due to SOB - has AICD present - saw cardiology ([facility]) 08/19/21 - have scheduled echo for 10/14/21 to evaluate EF since recent influenza and sepsis with worsening symptoms - on GDMT of carvedilol, entresto and spironolactone - discussed referral to [facility] for further evaluation and he was agreeable; will continue to see patient until he has an appointment with them; referral placed - type 1 DM so not a candidate for SGLT2 - saw pulmonology Lanney Gins) 02/06/20 - BNP 09/05/21 was 42.2  2: HTN- - BP looks good (104/59) - sees PCP at [place] 09/17/21 reviewed and showed sodium 140, potassium 3.4, creatinine 0.8 and GFR >60  3: Type 1 DM- - A1c 09/11/21 was 10.9% - using sliding scale insulin along with tresiba once daily; says that he rarely checks his insulin prior to using sliding scale and says that he estimates what he needs - discussed the importance of checking his glucose prior to using his sliding scale   Patient did not bring his medications nor a list. Each medication was verbally reviewed with the patient  and he was encouraged to bring the bottles to every visit to confirm accuracy of list.   Return in 3 weeks or sooner for any questions/problems before then.

## 2021-09-23 ENCOUNTER — Encounter: Payer: Self-pay | Admitting: Family

## 2021-09-23 ENCOUNTER — Ambulatory Visit: Payer: BC Managed Care – PPO | Attending: Family | Admitting: Family

## 2021-09-23 ENCOUNTER — Other Ambulatory Visit: Payer: Self-pay

## 2021-09-23 ENCOUNTER — Telehealth (HOSPITAL_COMMUNITY): Payer: Self-pay | Admitting: Internal Medicine

## 2021-09-23 VITALS — BP 104/59 | HR 117 | Resp 20 | Ht 73.0 in | Wt 201.0 lb

## 2021-09-23 DIAGNOSIS — G4733 Obstructive sleep apnea (adult) (pediatric): Secondary | ICD-10-CM | POA: Insufficient documentation

## 2021-09-23 DIAGNOSIS — I10 Essential (primary) hypertension: Secondary | ICD-10-CM

## 2021-09-23 DIAGNOSIS — J45909 Unspecified asthma, uncomplicated: Secondary | ICD-10-CM | POA: Insufficient documentation

## 2021-09-23 DIAGNOSIS — I2510 Atherosclerotic heart disease of native coronary artery without angina pectoris: Secondary | ICD-10-CM | POA: Insufficient documentation

## 2021-09-23 DIAGNOSIS — E119 Type 2 diabetes mellitus without complications: Secondary | ICD-10-CM

## 2021-09-23 DIAGNOSIS — E785 Hyperlipidemia, unspecified: Secondary | ICD-10-CM | POA: Diagnosis not present

## 2021-09-23 DIAGNOSIS — Z79899 Other long term (current) drug therapy: Secondary | ICD-10-CM | POA: Insufficient documentation

## 2021-09-23 DIAGNOSIS — Z794 Long term (current) use of insulin: Secondary | ICD-10-CM | POA: Insufficient documentation

## 2021-09-23 DIAGNOSIS — R569 Unspecified convulsions: Secondary | ICD-10-CM | POA: Diagnosis not present

## 2021-09-23 DIAGNOSIS — I5022 Chronic systolic (congestive) heart failure: Secondary | ICD-10-CM | POA: Insufficient documentation

## 2021-09-23 DIAGNOSIS — E109 Type 1 diabetes mellitus without complications: Secondary | ICD-10-CM | POA: Diagnosis not present

## 2021-09-23 DIAGNOSIS — Z955 Presence of coronary angioplasty implant and graft: Secondary | ICD-10-CM | POA: Diagnosis not present

## 2021-09-23 DIAGNOSIS — K219 Gastro-esophageal reflux disease without esophagitis: Secondary | ICD-10-CM | POA: Insufficient documentation

## 2021-09-23 DIAGNOSIS — Z95810 Presence of automatic (implantable) cardiac defibrillator: Secondary | ICD-10-CM | POA: Diagnosis not present

## 2021-09-23 DIAGNOSIS — I110 Hypertensive heart disease with heart failure: Secondary | ICD-10-CM | POA: Insufficient documentation

## 2021-09-23 NOTE — Patient Instructions (Signed)
Continue weighing daily and call for an overnight weight gain of 3 pounds or more or a weekly weight gain of more than 5 pounds.  °

## 2021-09-23 NOTE — Telephone Encounter (Signed)
[facility] called to schedule a new pt appt, nurse stated referral was sent, please advise

## 2021-09-24 DIAGNOSIS — E109 Type 1 diabetes mellitus without complications: Secondary | ICD-10-CM | POA: Diagnosis not present

## 2021-09-24 DIAGNOSIS — Z0000 Encounter for general adult medical examination without abnormal findings: Secondary | ICD-10-CM | POA: Diagnosis not present

## 2021-09-24 DIAGNOSIS — I10 Essential (primary) hypertension: Secondary | ICD-10-CM | POA: Diagnosis not present

## 2021-09-24 DIAGNOSIS — E11621 Type 2 diabetes mellitus with foot ulcer: Secondary | ICD-10-CM | POA: Diagnosis not present

## 2021-09-24 DIAGNOSIS — Z0130 Encounter for examination of blood pressure without abnormal findings: Secondary | ICD-10-CM | POA: Diagnosis not present

## 2021-09-24 DIAGNOSIS — I509 Heart failure, unspecified: Secondary | ICD-10-CM | POA: Diagnosis not present

## 2021-10-09 DIAGNOSIS — Z0130 Encounter for examination of blood pressure without abnormal findings: Secondary | ICD-10-CM | POA: Diagnosis not present

## 2021-10-09 DIAGNOSIS — I509 Heart failure, unspecified: Secondary | ICD-10-CM | POA: Diagnosis not present

## 2021-10-09 DIAGNOSIS — Z1329 Encounter for screening for other suspected endocrine disorder: Secondary | ICD-10-CM | POA: Diagnosis not present

## 2021-10-09 DIAGNOSIS — J189 Pneumonia, unspecified organism: Secondary | ICD-10-CM | POA: Diagnosis not present

## 2021-10-09 DIAGNOSIS — Z0000 Encounter for general adult medical examination without abnormal findings: Secondary | ICD-10-CM | POA: Diagnosis not present

## 2021-10-09 DIAGNOSIS — E109 Type 1 diabetes mellitus without complications: Secondary | ICD-10-CM | POA: Diagnosis not present

## 2021-10-09 DIAGNOSIS — R42 Dizziness and giddiness: Secondary | ICD-10-CM | POA: Diagnosis not present

## 2021-10-13 NOTE — Progress Notes (Signed)
Patient ID: Joseph [name], male    DOB: 04/23/1971, 51 y.o.   MRN: [id number]  HPI  Mr Tulloch is a 51 y/o male with a history of asthma, CAD, DM, GERD, hyperlipidemia, HTN, obstructive sleep apnea, seizures & chronic heart failure.   Echo report from 11/24/16 reviewed and showed an EF of 25-30% along with mild/moderate MR.   LHC on 11/23/16 showed: No high-grade coronary artery lesion to explain EKG changes and acute respiratory distress. There is moderate in-stent restenosis of the ostial/proximal LAD stent, as well as mild to moderate disease involving small jailed D1 and mid LAD. Severely reduced LV dysfunction with LVEF of approximately 25-30%. Mildly elevated left ventricular filling pressure, though evaluation is limited by significant respiratory variation.  Admitted 09/11/21 due to flu like symptoms. Hypoxic on room air due to influenza. Antibiotics given for presumed pneumonia. Able to be weaned off oxygen. PT eval done. Developed DKA which quickly resolved with IVF and insulin. Discharged after 6 days with home health PT. Was in the ED 09/05/21 due gastroenteritis where he was evaluated and released. Was in the ED 05/09/21 due to shortness of breath and headache. Given IV lasix with improvement of symptoms and he was released. Was in the ED 05/08/21 due to shortness of breath. Chest CTA done due to elevated d-dimer. Given IV lasix with improvement and subsequent increase of oral   He presents today for a follow-up visit with a chief complaint of minimal shortness of breath upon moderate exertion. He describes this as chronic in nature having been present for several years but is much better from his last visit here. He has associated fatigue, occasional cough/ wheezing, leg weakness, gradual weight gain (due to better appetite) and occasional dizziness along with this. He denies any difficulty sleeping, abdominal distention, palpitations, pedal edema or chest pain.   Says that he did have an  episode 3 days ago where he was standing to urinate first thing in the morning and when opening the blinds and after standing there for a few minutes, he started feeling dizzy/ woozy and he fell before he could sit down. He is unsure if he lost consciousness or not. No issues since that time. Does tend to notice the dizziness when he first gets up in the morning with opening the blinds to let the light in.   Other than that, overall, he says that he feels so much better from the last time he was here. Has not heard anything from the [facility] about an appointment.   Past Medical History:  Diagnosis Date   AICD (automatic cardioverter/defibrillator) present 2011   Asthma    Cancer ([place])    prostate   CHF (congestive heart failure) ([place])    Coronary artery disease    Diabetes mellitus without complication ([place])    Dyspnea    ED (erectile dysfunction)    GERD (gastroesophageal reflux disease)    Hyperlipidemia    Hypertension    Ischemic cardiomyopathy    LADA (latent autoimmune diabetes in adults), managed as type 1 ([place])    Myocardial infarction ([place]) 2011   anterior MI at [place] s/p BMS to ostial/proximal LAD   Neuromuscular disorder ([place])    problems in arms. finger tips have been numb/tingling. voltaren works   Obstructive sleep apnea 2018   cpap. has not used lately. uses a breathe-right strip on nose   Seizures ([place])    diabetes seizures (low blood sugar); last one 06/9832   Systolic heart  failure ([place])    Vitamin D deficiency    Past Surgical History:  Procedure Laterality Date   CARDIAC DEFIBRILLATOR PLACEMENT  08/30/2010   CHOLECYSTECTOMY N/A 11/07/2017   Procedure: LAPAROSCOPIC CHOLECYSTECTOMY;  Surgeon: Herbert Pun, MD;  Location: [facility] ORS;  Service: General;  Laterality: N/A;   CORONARY ANGIOPLASTY  2011   stent placed x 1   CYSTOSCOPY N/A 01/03/2020   Procedure: CYSTOSCOPY FLEXIBLE;  Surgeon: Hollice Espy, MD;  Location: [facility] ORS;  Service: Urology;  Laterality:  N/A;   ESOPHAGOGASTRODUODENOSCOPY (EGD) WITH PROPOFOL N/A 03/20/2018   Procedure: ESOPHAGOGASTRODUODENOSCOPY (EGD) WITH PROPOFOL;  Surgeon: Manya Silvas, MD;  Location: [facility] ENDOSCOPY;  Service: Endoscopy;  Laterality: N/A;   LEFT HEART CATH AND CORONARY ANGIOGRAPHY N/A 11/23/2016   Procedure: Left Heart Cath and Coronary Angiography;  Surgeon: Nelva Bush, MD;  Location: [place] CV LAB;  Service: Cardiovascular;  Laterality: N/A;   PROSTATE BIOPSY  2019   PROSTATE BIOPSY N/A 10/23/2019   Procedure: PROSTATE BIOPSY;  Surgeon: Abbie Sons, MD;  Location: [facility] ORS;  Service: Urology;  Laterality: N/A;   ROBOT ASSISTED LAPAROSCOPIC RADICAL PROSTATECTOMY N/A 01/03/2020   Procedure: XI ROBOTIC ASSISTED LAPAROSCOPIC RADICAL PROSTATECTOMY;  Surgeon: Hollice Espy, MD;  Location: [facility] ORS;  Service: Urology;  Laterality: N/A;   TRANSRECTAL ULTRASOUND N/A 10/23/2019   Procedure: TRANSRECTAL ULTRASOUND;  Surgeon: Abbie Sons, MD;  Location: [facility] ORS;  Service: Urology;  Laterality: N/A;   WRIST SURGERY Right 2008   Family History  Problem Relation Age of Onset   Hypertension Mother    Heart attack Paternal Grandmother    Heart attack Father    Bladder Cancer Neg Hx    Kidney cancer Neg Hx    Prostate cancer Neg Hx    Social History   Tobacco Use   Smoking status: Never   Smokeless tobacco: Never  Substance Use Topics   Alcohol use: No   Allergies  Allergen Reactions   Vancomycin Shortness Of Breath   Lisinopril Rash   Prior to Admission medications   Medication Sig Start Date End Date Taking? Authorizing Provider  acetaminophen (TYLENOL) 500 MG tablet Take 500 mg by mouth every 6 (six) hours as needed for fever.   Yes [provider]  albuterol (PROVENTIL HFA) 108 (90 Base) MCG/ACT inhaler Inhale 2 puffs into the lungs every 4 (four) hours as needed for wheezing or shortness of breath. 10/08/18  Yes Carrie Mew, MD  aspirin EC 81 MG tablet Take 81 mg  by mouth every morning.   Yes [provider]  atorvastatin (LIPITOR) 40 MG tablet Take 40 mg by mouth daily. 06/08/20  Yes [provider]  carvedilol (COREG) 12.5 MG tablet Take 12.5 mg by mouth daily. 05/28/20  Yes [provider]  Cholecalciferol 25 MCG (1000 UT) capsule Take 1,000 Units by mouth daily.   Yes [provider]  digoxin (LANOXIN) 0.125 MG tablet Take 1 tablet (125 mcg total) by mouth daily. 01/15/20  Yes Lorella Nimrod, MD  fluticasone (FLONASE) 50 MCG/ACT nasal spray Place 2 sprays into both nostrils daily as needed for allergies.  01/26/16  Yes [provider]  glucagon, human recombinant, (GLUCAGEN) 1 MG injection Inject into the muscle. 02/07/18  Yes [provider]  insulin aspart (NOVOLOG) 100 UNIT/ML FlexPen Inject 18-20-22 u TID AC plus sliding scale for up to 75 units TDD 01/10/20  Yes [provider]  insulin degludec (TRESIBA FLEXTOUCH) 200 UNIT/ML FlexTouch Pen Inject 60 Units into  the skin daily. 01/29/20  Yes [provider]  Insulin Pen Needle (B-D ULTRAFINE III SHORT PEN) 31G X 8 MM MISC USE 5 TIMES DAILY AS  DIRECTED 12/01/19  Yes Carrie Mew, MD  loratadine (CLARITIN) 10 MG tablet Take 10 mg by mouth daily as needed.    Yes [provider]  naproxen sodium (ALEVE) 220 MG tablet Take 220 mg by mouth daily as needed.   Yes [provider]  potassium chloride (KLOR-CON) 10 MEQ tablet Take 10 mEq by mouth daily. 04/01/20  Yes [provider]  sacubitril-valsartan (ENTRESTO) 97-103 MG Take 1 tablet by mouth 2 (two) times daily. 05/13/21  Yes Darylene Price A, FNP  spironolactone (ALDACTONE) 25 MG tablet Take 0.5 tablets (12.5 mg total) by mouth daily. 01/15/20  Yes Lorella Nimrod, MD  SYMBICORT 160-4.5 MCG/ACT inhaler Inhale 2 puffs into the lungs 2 (two) times daily. 05/27/20  Yes [provider]  torsemide (DEMADEX) 20 MG tablet Take 2 tablets (40 mg total) by mouth daily.  08/06/21 11/04/21 Yes Alisa Graff, FNP   Review of Systems  Constitutional:  Positive for fatigue. Negative for appetite change (much better).  HENT:  Negative for congestion, postnasal drip, sneezing and sore throat.   Eyes: Negative.   Respiratory:  Positive for cough, shortness of breath and wheezing. Negative for chest tightness.   Cardiovascular:  Negative for chest pain, palpitations and leg swelling.  Gastrointestinal:  Negative for abdominal distention and abdominal pain.  Endocrine: Negative.   Genitourinary: Negative.   Musculoskeletal:  Negative for back pain and neck pain.  Skin: Negative.   Allergic/Immunologic: Negative.   Neurological:  Positive for dizziness (when changing positions or first thing in the morning when opening blinds) and weakness (legs much better). Negative for light-headedness.  Hematological:  Negative for adenopathy. Does not bruise/bleed easily.  Psychiatric/Behavioral:  Negative for dysphoric mood and sleep disturbance (sleeping on 2 pillows). The patient is not nervous/anxious.    Vitals:   10/14/21 1156  BP: 115/70  Pulse: 100  Resp: 18  SpO2: 98%  Weight: 206 lb 8 oz (93.7 kg)  Height: 6\' 1"  (1.854 m)   Wt Readings from Last 3 Encounters:  10/14/21 206 lb 8 oz (93.7 kg)  09/23/21 201 lb (91.2 kg)  09/13/21 207 lb 14.3 oz (94.3 kg)   Lab Results  Component Value Date   CREATININE 0.80 09/17/2021   CREATININE 0.98 09/16/2021   CREATININE 0.88 09/15/2021   Physical Exam Vitals and nursing note reviewed.  Constitutional:      Appearance: Normal appearance.  HENT:     Head: Normocephalic and atraumatic.  Cardiovascular:     Rate and Rhythm: Regular rhythm. Tachycardia present.  Pulmonary:     Effort: Pulmonary effort is normal. No respiratory distress.     Breath sounds: No wheezing or rales.  Abdominal:     General: There is no distension.  Musculoskeletal:        General: No tenderness.     Cervical back: Normal range of  motion and neck supple.     Right lower leg: Edema (trace pitting) present.     Left lower leg: Edema (trace pitting) present.  Skin:    General: Skin is warm and dry.  Neurological:     General: No focal deficit present.     Mental Status: He is alert and oriented to person, place, and time.  Psychiatric:        Mood and Affect: Mood normal.  Behavior: Behavior normal.        Thought Content: Thought content normal.   Assessment & Plan:  1: Chronic heart failure with reduced ejection fraction- - NYHA class II - euvolemic today - weighing daily; reminded to call for an overnight weight gain of > 2 pounds or a weekly weight gain of > 5 pounds - weight up 5 pounds from last visit here 3 weeks ago; reports appetite has improved - not adding salt to his food and has been trying to read food labels for sodium content - has AICD present - saw cardiology ([facility]) 08/19/21 - had echo done earlier today - on GDMT of carvedilol, entresto and spironolactone - has not heard from [facility] so will reach back out to them - type 1 DM so not a candidate for SGLT2 - saw pulmonology Lanney Gins) 02/06/20 - walking without his walker now and is getting ready to return to work - BNP 09/05/21 was 42.2 - PharmD reconciled medications with the patient  2: HTN- - BP looks good (115/70) - [facility] at [facility] 10/09/21 - BMP 09/17/21 reviewed and showed sodium 140, potassium 3.4, creatinine 0.8 and GFR >60  3: Type 1 DM- - A1c 09/11/21 was 10.9% - using sliding scale insulin along with tresiba once daily; says that he rarely checks his insulin prior to using sliding scale and says that he estimates what he needs - glucose at home today was 250 - discussed the importance of checking his glucose prior to using his sliding scale especially since he's having issues with dizziness first thing in the morning   Patient did not bring his medications nor a list. Each medication was verbally reviewed with  the patient and he was encouraged to bring the bottles to every visit to confirm accuracy of list.  Return in 1 month or sooner for any questions/problems before then.

## 2021-10-14 ENCOUNTER — Ambulatory Visit
Admission: RE | Admit: 2021-10-14 | Discharge: 2021-10-14 | Disposition: A | Discharge status: Home / Self Care | Payer: BC Managed Care – PPO | Source: Ambulatory Visit | Attending: Family | Admitting: Family

## 2021-10-14 ENCOUNTER — Other Ambulatory Visit: Payer: Self-pay

## 2021-10-14 ENCOUNTER — Encounter: Payer: Self-pay | Admitting: Family

## 2021-10-14 ENCOUNTER — Ambulatory Visit (HOSPITAL_BASED_OUTPATIENT_CLINIC_OR_DEPARTMENT_OTHER): Payer: BC Managed Care – PPO | Admitting: Family

## 2021-10-14 VITALS — BP 115/70 | HR 100 | Resp 18 | Ht 73.0 in | Wt 206.5 lb

## 2021-10-14 DIAGNOSIS — I5022 Chronic systolic (congestive) heart failure: Secondary | ICD-10-CM | POA: Insufficient documentation

## 2021-10-14 DIAGNOSIS — K219 Gastro-esophageal reflux disease without esophagitis: Secondary | ICD-10-CM | POA: Diagnosis not present

## 2021-10-14 DIAGNOSIS — E1065 Type 1 diabetes mellitus with hyperglycemia: Secondary | ICD-10-CM | POA: Insufficient documentation

## 2021-10-14 DIAGNOSIS — I110 Hypertensive heart disease with heart failure: Secondary | ICD-10-CM | POA: Diagnosis not present

## 2021-10-14 DIAGNOSIS — I340 Nonrheumatic mitral (valve) insufficiency: Secondary | ICD-10-CM | POA: Diagnosis not present

## 2021-10-14 DIAGNOSIS — G4733 Obstructive sleep apnea (adult) (pediatric): Secondary | ICD-10-CM | POA: Diagnosis not present

## 2021-10-14 DIAGNOSIS — Z95810 Presence of automatic (implantable) cardiac defibrillator: Secondary | ICD-10-CM | POA: Diagnosis not present

## 2021-10-14 DIAGNOSIS — Z955 Presence of coronary angioplasty implant and graft: Secondary | ICD-10-CM | POA: Insufficient documentation

## 2021-10-14 DIAGNOSIS — I10 Essential (primary) hypertension: Secondary | ICD-10-CM

## 2021-10-14 DIAGNOSIS — Z79899 Other long term (current) drug therapy: Secondary | ICD-10-CM | POA: Diagnosis not present

## 2021-10-14 DIAGNOSIS — E785 Hyperlipidemia, unspecified: Secondary | ICD-10-CM | POA: Diagnosis not present

## 2021-10-14 DIAGNOSIS — I252 Old myocardial infarction: Secondary | ICD-10-CM | POA: Insufficient documentation

## 2021-10-14 DIAGNOSIS — R569 Unspecified convulsions: Secondary | ICD-10-CM | POA: Insufficient documentation

## 2021-10-14 DIAGNOSIS — J45909 Unspecified asthma, uncomplicated: Secondary | ICD-10-CM | POA: Diagnosis not present

## 2021-10-14 DIAGNOSIS — I509 Heart failure, unspecified: Secondary | ICD-10-CM | POA: Diagnosis not present

## 2021-10-14 DIAGNOSIS — E119 Type 2 diabetes mellitus without complications: Secondary | ICD-10-CM | POA: Diagnosis not present

## 2021-10-14 DIAGNOSIS — Z1389 Encounter for screening for other disorder: Secondary | ICD-10-CM | POA: Diagnosis not present

## 2021-10-14 DIAGNOSIS — I2510 Atherosclerotic heart disease of native coronary artery without angina pectoris: Secondary | ICD-10-CM | POA: Insufficient documentation

## 2021-10-14 NOTE — Progress Notes (Signed)
*  PRELIMINARY RESULTS* Echocardiogram 2D Echocardiogram has been performed.  Joseph [name] 10/14/2021, 10:34 AM

## 2021-10-14 NOTE — Progress Notes (Signed)
[place] - PHARMACIST COUNSELING NOTE  Guideline-Directed Medical Therapy/Evidence Based Medicine  ACE/ARB/ARNI: Sacubitril-valsartan 97-103 mg twice daily Beta Blocker: Carvedilol 12.5 mg twice daily Aldosterone Antagonist: Spironolactone 25 mg daily Diuretic: Torsemide 20 mg daily SGLT2i:  None  Adherence Assessment  Do you ever forget to take your medication? [] Yes [x] No  Do you ever skip doses due to side effects? [] Yes [x] No  Do you have trouble affording your medicines? [] Yes [x] No  Are you ever unable to pick up your medication due to transportation difficulties? [] Yes [x] No  Do you ever stop taking your medications because you don't believe they are helping? [] Yes [x] No  Do you check your weight daily? [x] Yes [] No   Adherence: Pt reports to be adherent to his medications.   Barriers to obtaining medications: None reported by the patient.   Vital signs: HR 100, BP 115/70, weight (pounds) 206 ECHO: Date 11/2016, EF 25-30,  BMP Latest Ref Rng & Units 09/17/2021 09/16/2021 09/15/2021  Glucose 70 - 99 mg/dL 183(H) 238(H) 253(H)  BUN 6 - 20 mg/dL 21(H) 25(H) 30(H)  Creatinine 0.61 - 1.24 mg/dL 0.80 0.98 0.88  Sodium 135 - 145 mmol/L 140 137 138  Potassium 3.5 - 5.1 mmol/L 3.4(L) 3.1(L) 3.4(L)  Chloride 98 - 111 mmol/L 105 104 104  CO2 22 - 32 mmol/L 26 27 26   Calcium 8.9 - 10.3 mg/dL 8.0(L) 7.8(L) 7.8(L)    Past Medical History:  Diagnosis Date   AICD (automatic cardioverter/defibrillator) present 2011   Asthma    Cancer ([place])    prostate   CHF (congestive heart failure) ([place])    Coronary artery disease    Diabetes mellitus without complication ([place])    Dyspnea    ED (erectile dysfunction)    GERD (gastroesophageal reflux disease)    Hyperlipidemia    Hypertension    Ischemic cardiomyopathy    LADA (latent autoimmune diabetes in adults), managed as type 1 ([place])    Myocardial infarction ([place]) 2011   anterior MI at  [place] s/p BMS to ostial/proximal LAD   Neuromuscular disorder ([place])    problems in arms. finger tips have been numb/tingling. voltaren works   Obstructive sleep apnea 2018   cpap. has not used lately. uses a breathe-right strip on nose   Seizures (HCC)    diabetes seizures (low blood sugar); last one 35/5732   Systolic heart failure ([place])    Vitamin D deficiency     ASSESSMENT 51 year old male who presents to the HF clinic for a follow up. Patient has some complaints of dizziness. He saw his PCP last week and said his blood pressure is normal. I recommended that the patient check his blood glucose when he has those symptoms. T1Dm and T2DM is listed on the patients eMAR.    PLAN HFrEF - continue current medications - potential SGLT2 start if pt does not have T1DM     Time spent: 10 minutes  Joseph [name], Pharm.D. Clinical Pharmacist 10/14/2021 12:14 PM    Current Outpatient Medications:    acetaminophen (TYLENOL) 500 MG tablet, Take 500 mg by mouth every 6 (six) hours as needed for fever., Disp: , Rfl:    albuterol (PROVENTIL HFA) 108 (90 Base) MCG/ACT inhaler, Inhale 2 puffs into the lungs every 4 (four) hours as needed for wheezing or shortness of breath., Disp: 1 Inhaler, Rfl: 0   aspirin EC 81 MG tablet, Take 81 mg by mouth every morning., Disp: , Rfl:  atorvastatin (LIPITOR) 40 MG tablet, Take 40 mg by mouth daily., Disp: , Rfl:    carvedilol (COREG) 12.5 MG tablet, Take 12.5 mg by mouth daily., Disp: , Rfl:    Cholecalciferol 25 MCG (1000 UT) capsule, Take 1,000 Units by mouth daily., Disp: , Rfl:    digoxin (LANOXIN) 0.125 MG tablet, Take 1 tablet (125 mcg total) by mouth daily., Disp: 60 tablet, Rfl: 0   fluticasone (FLONASE) 50 MCG/ACT nasal spray, Place 2 sprays into both nostrils daily as needed for allergies. , Disp: , Rfl:    glucagon, human recombinant, (GLUCAGEN) 1 MG injection, Inject into the muscle., Disp: , Rfl:    insulin aspart (NOVOLOG) 100 UNIT/ML  FlexPen, Inject 18-20-22 u TID AC plus sliding scale for up to 75 units TDD, Disp: , Rfl:    insulin degludec (TRESIBA FLEXTOUCH) 200 UNIT/ML FlexTouch Pen, Inject 60 Units into the skin daily., Disp: , Rfl:    Insulin Pen Needle (B-D ULTRAFINE III SHORT PEN) 31G X 8 MM MISC, USE 5 TIMES DAILY AS  DIRECTED, Disp: 75 each, Rfl: 0   loratadine (CLARITIN) 10 MG tablet, Take 10 mg by mouth daily as needed. , Disp: , Rfl:    naproxen sodium (ALEVE) 220 MG tablet, Take 220 mg by mouth daily as needed., Disp: , Rfl:    potassium chloride (KLOR-CON) 10 MEQ tablet, Take 10 mEq by mouth daily., Disp: , Rfl:    sacubitril-valsartan (ENTRESTO) 97-103 MG, Take 1 tablet by mouth 2 (two) times daily., Disp: 60 tablet, Rfl: 5   spironolactone (ALDACTONE) 25 MG tablet, Take 0.5 tablets (12.5 mg total) by mouth daily., Disp: 30 tablet, Rfl: 0   SYMBICORT 160-4.5 MCG/ACT inhaler, Inhale 2 puffs into the lungs 2 (two) times daily., Disp: , Rfl:    torsemide (DEMADEX) 20 MG tablet, Take 2 tablets (40 mg total) by mouth daily., Disp: 60 tablet, Rfl: 5    DRUGS TO CAUTION IN HEART FAILURE  Drug or Class Mechanism  Analgesics NSAIDs COX-2 inhibitors Glucocorticoids  Sodium and water retention, increased systemic vascular resistance, decreased response to diuretics   Diabetes Medications Metformin Thiazolidinediones Rosiglitazone (Avandia) Pioglitazone (Actos) DPP4 Inhibitors Saxagliptin (Onglyza) Sitagliptin (Januvia)   Lactic acidosis Possible calcium channel blockade   Unknown  Antiarrhythmics Class I  Flecainide Disopyramide Class III Sotalol Other Dronedarone  Negative inotrope, proarrhythmic   Proarrhythmic, beta blockade  Negative inotrope  Antihypertensives Alpha Blockers Doxazosin Calcium Channel Blockers Diltiazem Verapamil Nifedipine Central Alpha Adrenergics Moxonidine Peripheral Vasodilators Minoxidil  Increases renin and aldosterone  Negative  inotrope    Possible sympathetic withdrawal  Unknown  Anti-infective Itraconazole Amphotericin B  Negative inotrope Unknown  Hematologic Anagrelide Cilostazol   Possible inhibition of PD IV Inhibition of PD III causing arrhythmias  Neurologic/Psychiatric Stimulants Anti-Seizure Drugs Carbamazepine Pregabalin Antidepressants Tricyclics Citalopram Parkinsons Bromocriptine Pergolide Pramipexole Antipsychotics Clozapine Antimigraine Ergotamine Methysergide Appetite suppressants Bipolar Lithium  Peripheral alpha and beta agonist activity  Negative inotrope and chronotrope Calcium channel blockade  Negative inotrope, proarrhythmic Dose-dependent QT prolongation  Excessive serotonin activity/valvular damage Excessive serotonin activity/valvular damage Unknown  IgE mediated hypersensitivy, calcium channel blockade  Excessive serotonin activity/valvular damage Excessive serotonin activity/valvular damage Valvular damage  Direct myofibrillar degeneration, adrenergic stimulation  Antimalarials Chloroquine Hydroxychloroquine Intracellular inhibition of lysosomal enzymes  Urologic Agents Alpha Blockers Doxazosin Prazosin Tamsulosin Terazosin  Increased renin and aldosterone  Adapted from Page Carleene Overlie, et al. Drugs That May Cause or Exacerbate Heart Failure: A Scientific Statement from the American Heart  Association. Circulation 2016;  967:R91-M38. DOI: 10.1161/CIR.0000000000000426   MEDICATION ADHERENCES TIPS AND STRATEGIES Taking medication as prescribed improves patient outcomes in heart failure (reduces hospitalizations, improves symptoms, increases survival) Side effects of medications can be managed by decreasing doses, switching agents, stopping drugs, or adding additional therapy. Please let someone in the [facility] know if you have having bothersome side effects so we can modify your regimen. Do not alter your medication regimen without  talking to [place].  Medication reminders can help patients remember to take drugs on time. If you are missing or forgetting doses you can try linking behaviors, using pill boxes, or an electronic reminder like an alarm on your phone or an app. Some people can also get automated phone calls as medication reminders.

## 2021-10-14 NOTE — Patient Instructions (Signed)
Continue weighing daily and call for an overnight weight gain of 3 pounds or more or a weekly weight gain of more than 5 pounds.  °

## 2021-10-15 LAB — ECHOCARDIOGRAM COMPLETE
AR max vel: 3.12 cm2
AV Area VTI: 3.11 cm2
AV Area mean vel: 3.01 cm2
AV Mean grad: 4 mmHg
AV Peak grad: 7 mmHg
Ao pk vel: 1.32 m/s
Area-P 1/2: 4.21 cm2
Calc EF: 31.4 %
MV VTI: 2.45 cm2
S' Lateral: 5 cm
Single Plane A2C EF: 44.4 %
Single Plane A4C EF: 4.4 %

## 2021-10-16 ENCOUNTER — Telehealth: Payer: Self-pay

## 2021-10-16 NOTE — Telephone Encounter (Addendum)
Spoke with patient regarding echo results and message below from [facility], NP. Patient acknowledged and stated he had no further questions at this time. Instructed patient to call us back if he does not hear from [facility], or if any further questions or symptoms arise before next appointment.  Georg Ruddle, RN ----- Message from Alisa Graff, [place] sent at 10/16/2021  8:35 AM EST ----- Heart function is still on the low side and isn't squeezing as strongly as we would like. I've reached back out to [facility]. Until you see them, we will continue to see you and adjust medications

## 2021-11-02 NOTE — Progress Notes (Addendum)
ADVANCED HF CLINIC CONSULT NOTE  Referring Physician: Darylene Price, NP Primary Care: [facility] Primary Cardiologist: Dr. Saralyn Pilar ([facility]) HF Cardiologist: Dr. Aundra Dubin  HPI: Joseph [name] is a 51 y.o.male with a history of asthma, CAD, DM type 1, GERD, hyperlipidemia, HTN, obstructive sleep apnea, seizures & chronic systolic heart failure/iCM, s/p BoSCI ICD.  Cardiomyopathy dates back to 03/2010 with EF < 35% after anterior MI, (stent to LAD and required IABP). Echo 06/2010 showed EF < 35% after GDMT, and he underwent ICD implantation (BoSCI).  He was followed by General Cardiology at [facility] (Dr. Saralyn Pilar) for his CAD.  Echo 2017 EF 40% moderate LV dysfunction.  Admitted 2/18 for STEMI, R/LHC showed moderate in-stent restenosis of the ostial/proximal LAD stent, as well as mild to moderate disease involving small jailed D1 and mid LAD, severely reduced LV dysfunction with LVEF of approximately 25-30%, mildly elevated left ventricular filling pressure, though evaluation is limited by significant respiratory variation. Echo showed EF 25-30%, mild/mod MR, normal RV. He was diuresed and discharged.   Admitted to [facility] 7/22 for a/c CHF after running out of torsemide. He was diuresed and discharged home with referral to AHF at [facility]. GDMT titrated at [facility] since 05/2021.   Admitted 12/22 with + influenza and pneumonia. Course complicated by DKA. Discharged home with HHPT.  Echo 1/23 showed EF 25-30%, LV function severely depressed and moderately dilated, normal RV, mild to moderate MR.  Per chart review, appears functional status baseline is NYHA II- early III. Has run out of diuretics in the past, requiring hospitalization.  Today he presents to establish with AHF Clinic, referred by Darylene Price, FNP. Overall feeling fine. 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 He gets winded with more physical exertion. Some dizziness and  light-headedness but no falls. Denies palpitations, abnormal bleeding, CP, edema, or PND/Orthopnea. Appetite ok. No fever or chills. Weight at home 215 pounds. Taking all medications.   Lives with GF, has a daughter, and just found out he is going to be grandfather! He is not wearing his CPAP, he needs a new mask. Father had MI in his 76s but no other family history of cardiac issues. He does not use tobacco/ETOH/drugs.   ECG (personally reviewed): NSR, IVCD QRS 130 msec  Labs (12/22): K 3.4, creatinine 0.8  Device interrogation (personally reviewed): no sustained ventricular arrhythmias, 2.9 hours day/activity, average HR 98 bpm.  Cardiac Studies: - s/p PCI (03/2010 at [facility]) to ostial/proximal LAD - Echo (03/2010): with EF < 35% after anterior MI - Echo (9/11): EF< 35% - Echo (2017): EF 40% - Echo (2/18): EF 25-30% (in setting of stent restenosis of LAD stent), mild/mod MR, normal RV. - Echo (1/23): EF 25-30%, LV function severely depressed and moderately dilated, normal RV, mild to moderate MR.  Review of Systems: [y] = yes, [ ]  = no   General: Weight gain [ ] ; Weight loss [ ] ; Anorexia [ ] ; Fatigue [ ] ; Fever [ ] ; Chills [ ] ; Weakness [ ]   Cardiac: Chest pain/pressure [ ] ; Resting SOB [ ] ; Exertional SOB Blue.Reese ]; Orthopnea [ ] ; Pedal Edema [ ] ; Palpitations [ ] ; Syncope [ ] ; Presyncope [ ] ; Paroxysmal nocturnal dyspnea[ ]   Pulmonary: Cough [ ] ; Wheezing[ ] ; Hemoptysis[ ] ; Sputum [ ] ; Snoring [ ]   GI: Vomiting[ ] ; Dysphagia[ ] ; Melena[ ] ; Hematochezia [ ] ; Heartburn[ ] ; Abdominal pain [ ] ; Constipation [ ] ; Diarrhea [ ] ; BRBPR [ ]   GU: Hematuria[ ] ; Dysuria [ ] ; Nocturia[ ]   Vascular: Pain in legs with walking [ ] ; Pain in feet with lying flat [ ] ; Non-healing sores [ ] ; Stroke [ ] ; TIA [ ] ; Slurred speech [ ] ;  Neuro: Headaches[ ] ; Vertigo[ ] ; Seizures[ ] ; Paresthesias[ ] ;Blurred vision [ ] ; Diplopia [ ] ; Vision changes [ ]   Ortho/Skin: Arthritis [ ] ; Joint pain [ ] ; Muscle pain [ ] ; Joint  swelling [ ] ; Back Pain [ ] ; Rash [ ]   Psych: Depression[ ] ; Anxiety[ ]   Heme: Bleeding problems [ ] ; Clotting disorders [ ] ; Anemia [ ]   Endocrine: Diabetes [y]; Thyroid dysfunction[ ]   Past Medical History:  Diagnosis Date   AICD (automatic cardioverter/defibrillator) present 2011   Asthma    Cancer ([place])    prostate   CHF (congestive heart failure) ([place])    Coronary artery disease    Diabetes mellitus without complication ([place])    Dyspnea    ED (erectile dysfunction)    GERD (gastroesophageal reflux disease)    Hyperlipidemia    Hypertension    Ischemic cardiomyopathy    LADA (latent autoimmune diabetes in adults), managed as type 1 ([place])    Myocardial infarction ([place]) 2011   anterior MI at [place] s/p BMS to ostial/proximal LAD   Neuromuscular disorder ([place])    problems in arms. finger tips have been numb/tingling. voltaren works   Obstructive sleep apnea 2018   cpap. has not used lately. uses a breathe-right strip on nose   Seizures (HCC)    diabetes seizures (low blood sugar); last one 41/6606   Systolic heart failure (HCC)    Vitamin D deficiency    Current Outpatient Medications  Medication Sig Dispense Refill   acetaminophen (TYLENOL) 500 MG tablet Take 500 mg by mouth every 6 (six) hours as needed for fever.     albuterol (PROVENTIL HFA) 108 (90 Base) MCG/ACT inhaler Inhale 2 puffs into the lungs every 4 (four) hours as needed for wheezing or shortness of breath. 1 Inhaler 0   aspirin EC 81 MG tablet Take 81 mg by mouth every morning.     atorvastatin (LIPITOR) 40 MG tablet Take 40 mg by mouth daily.     carvedilol (COREG) 12.5 MG tablet Take 12.5 mg by mouth daily.     Cholecalciferol 25 MCG (1000 UT) capsule Take 1,000 Units by mouth daily.     digoxin (LANOXIN) 0.125 MG tablet Take 1 tablet (125 mcg total) by mouth daily. 60 tablet 0   fluticasone (FLONASE) 50 MCG/ACT nasal spray Place 2 sprays into both nostrils daily as needed for allergies.      glucagon, human  recombinant, (GLUCAGEN) 1 MG injection Inject into the muscle as needed.     insulin aspart (NOVOLOG) 100 UNIT/ML FlexPen Inject 18-20-22 u TID AC plus sliding scale for up to 75 units TDD     insulin degludec (TRESIBA FLEXTOUCH) 200 UNIT/ML FlexTouch Pen Inject 60 Units into the skin daily.     Insulin Pen Needle (B-D ULTRAFINE III SHORT PEN) 31G X 8 MM MISC USE 5 TIMES DAILY AS  DIRECTED 75 each 0   loratadine (CLARITIN) 10 MG tablet Take 10 mg by mouth daily as needed.      naproxen sodium (ALEVE) 220 MG tablet Take 220 mg by mouth daily as needed.     potassium chloride (KLOR-CON) 10 MEQ tablet Take 10 mEq by mouth daily.     sacubitril-valsartan (ENTRESTO) 97-103 MG Take 1 tablet by mouth 2 (  two) times daily. 60 tablet 5   spironolactone (ALDACTONE) 25 MG tablet Take 0.5 tablets (12.5 mg total) by mouth daily. 30 tablet 0   SYMBICORT 160-4.5 MCG/ACT inhaler Inhale 2 puffs into the lungs 2 (two) times daily.     torsemide (DEMADEX) 20 MG tablet Take 2 tablets (40 mg total) by mouth daily. 60 tablet 5   No current facility-administered medications for this encounter.   Allergies  Allergen Reactions   Vancomycin Shortness Of Breath   Lisinopril Rash   Social History   Socioeconomic History   Marital status: Divorced    Spouse name: Not on file   Number of children: Not on file   Years of education: Not on file   Highest education level: Not on file  Occupational History   Occupation: Glass blower/designer  Tobacco Use   Smoking status: Never   Smokeless tobacco: Never  Vaping Use   Vaping Use: Never used  Substance and Sexual Activity   Alcohol use: No   Drug use: No   Sexual activity: Yes  Other Topics Concern   Not on file  Social History Narrative   Not on file   Social Determinants of Health   Financial Resource Strain: Not on file  Food Insecurity: Not on file  Transportation Needs: Not on file  Physical Activity: Not on file  Stress: Not on file  Social  Connections: Not on file  Intimate Partner Violence: Not on file   Family History  Problem Relation Age of Onset   Hypertension Mother    Heart attack Paternal Grandmother    Heart attack Father    Bladder Cancer Neg Hx    Kidney cancer Neg Hx    Prostate cancer Neg Hx    Wt Readings from Last 3 Encounters:  11/03/21 97 kg (213 lb 12.8 oz)  10/14/21 93.7 kg (206 lb 8 oz)  09/23/21 91.2 kg (201 lb)   BP 104/64    Pulse (!) 102    Wt 97 kg (213 lb 12.8 oz)    SpO2 96%    BMI 28.21 kg/m   PHYSICAL EXAM: General:  NAD. No resp difficulty HEENT: Normal Neck: Supple. No JVD. Carotids 2+ bilat; no bruits. No lymphadenopathy or thryomegaly appreciated. Cor: PMI nondisplaced. Regular rate & rhythm. No rubs, gallops or murmurs. Lungs: Clear Abdomen: Soft, nontender, nondistended. No hepatosplenomegaly. No bruits or masses. Good bowel sounds. Extremities: No cyanosis, clubbing, rash, edema Neuro: Alert & oriented x 3, cranial nerves grossly intact. Moves all 4 extremities w/o difficulty. Affect pleasant.  ASSESSMENT & PLAN: 1. Chronic Systolic Heart Failure: iCM, dating back to 2011. S/p ICD 2011. EF (2017) improved to 40%. Most recent echo (1/23) shows EF 25-30%, LV function severely depressed and moderately dilated, normal RV, mild to moderate MR. He is ischemic CM, but ? If functional MR and HTN also playing a role.  He has not had a cMRI, however his device may prevent this. He seems to be doing well. He is not volume overloaded today on exam, ReDs 34%. Stable NYHA II symptoms. - Will not increase meds today with occasional dizziness just getting back into work. - Continue torsemide 40 mg daily. - Continue carvedilol 12.5 mg bid. Consider switching to Toprol or adding ivabradine if HR remains elevated. - Continue Entresto 97/103 mg bid. BMET/BNP today. - Continue spironolactone 25 mg daily. - Continue digoxin 0.125 mg daily. Check dig level today. - No SGLT2i with DM type 1. -  Consider CPX  down the road. May be a candidate for advanced therapies (needs better blood glucose control) if he were to decline. 2. HTN: BP well controlled today. Continue current medications. 3. CAD: s/p stent LAD (2011) and re-stenosis ostial/proximal LAD stent, as well as mild to moderate disease involving small jailed D1 and mid LAD (2018). - No chest pain. - Continue ASA, beta blocker and atorvastatin 40 mg daily.  4. Type 1 DM: Per PCP. No SGLT2i. A1c 10.9 (12/22). - He is followed by Endocrinology at [facility]. 5. Mitral regurgitation: moderate on recent echo (1/23), MV peak gradient, 6.0 mmHg. The mean mitral valve gradient is 3.0 mmHg. Consider TEE to further evaluate.  6. HLD: Continue statin. I do not see a recent lipid panel. Check lipids today.  7. OSA: Continue CPAP. Encouraged him to get a mask for his CPAP. He declines referral today to sleep medicine. 8. Obesity: Body mass index is 28.21 kg/m.  Discussed with Dr. Aundra Dubin. He is stable symptomatically, does not need repeat R/LHC yet. Will have him follow up with Dr. Aundra Dubin in 3 months with echo.  Allena Katz, FNP-BC 11/03/21

## 2021-11-03 ENCOUNTER — Other Ambulatory Visit: Payer: Self-pay

## 2021-11-03 ENCOUNTER — Encounter (HOSPITAL_COMMUNITY): Payer: Self-pay

## 2021-11-03 ENCOUNTER — Encounter: Payer: Self-pay | Admitting: Family Medicine

## 2021-11-03 ENCOUNTER — Ambulatory Visit (HOSPITAL_COMMUNITY)
Admission: RE | Admit: 2021-11-03 | Discharge: 2021-11-03 | Disposition: A | Discharge status: Home / Self Care | Payer: BC Managed Care – PPO | Source: Ambulatory Visit | Attending: Family Medicine | Admitting: Family Medicine

## 2021-11-03 VITALS — BP 104/64 | HR 102 | Wt 213.8 lb

## 2021-11-03 DIAGNOSIS — Z8249 Family history of ischemic heart disease and other diseases of the circulatory system: Secondary | ICD-10-CM | POA: Diagnosis not present

## 2021-11-03 DIAGNOSIS — I110 Hypertensive heart disease with heart failure: Secondary | ICD-10-CM | POA: Diagnosis not present

## 2021-11-03 DIAGNOSIS — I255 Ischemic cardiomyopathy: Secondary | ICD-10-CM | POA: Insufficient documentation

## 2021-11-03 DIAGNOSIS — I10 Essential (primary) hypertension: Secondary | ICD-10-CM | POA: Diagnosis not present

## 2021-11-03 DIAGNOSIS — Z79899 Other long term (current) drug therapy: Secondary | ICD-10-CM | POA: Diagnosis not present

## 2021-11-03 DIAGNOSIS — Z95810 Presence of automatic (implantable) cardiac defibrillator: Secondary | ICD-10-CM | POA: Insufficient documentation

## 2021-11-03 DIAGNOSIS — E1069 Type 1 diabetes mellitus with other specified complication: Secondary | ICD-10-CM | POA: Diagnosis not present

## 2021-11-03 DIAGNOSIS — K219 Gastro-esophageal reflux disease without esophagitis: Secondary | ICD-10-CM | POA: Diagnosis not present

## 2021-11-03 DIAGNOSIS — Z955 Presence of coronary angioplasty implant and graft: Secondary | ICD-10-CM | POA: Insufficient documentation

## 2021-11-03 DIAGNOSIS — I340 Nonrheumatic mitral (valve) insufficiency: Secondary | ICD-10-CM

## 2021-11-03 DIAGNOSIS — Z7982 Long term (current) use of aspirin: Secondary | ICD-10-CM | POA: Diagnosis not present

## 2021-11-03 DIAGNOSIS — E109 Type 1 diabetes mellitus without complications: Secondary | ICD-10-CM | POA: Diagnosis not present

## 2021-11-03 DIAGNOSIS — Z6828 Body mass index (BMI) 28.0-28.9, adult: Secondary | ICD-10-CM | POA: Diagnosis not present

## 2021-11-03 DIAGNOSIS — I2510 Atherosclerotic heart disease of native coronary artery without angina pectoris: Secondary | ICD-10-CM

## 2021-11-03 DIAGNOSIS — I252 Old myocardial infarction: Secondary | ICD-10-CM | POA: Insufficient documentation

## 2021-11-03 DIAGNOSIS — G4733 Obstructive sleep apnea (adult) (pediatric): Secondary | ICD-10-CM | POA: Diagnosis not present

## 2021-11-03 DIAGNOSIS — E785 Hyperlipidemia, unspecified: Secondary | ICD-10-CM

## 2021-11-03 DIAGNOSIS — J45909 Unspecified asthma, uncomplicated: Secondary | ICD-10-CM | POA: Insufficient documentation

## 2021-11-03 DIAGNOSIS — I5022 Chronic systolic (congestive) heart failure: Secondary | ICD-10-CM | POA: Diagnosis not present

## 2021-11-03 DIAGNOSIS — E669 Obesity, unspecified: Secondary | ICD-10-CM | POA: Diagnosis not present

## 2021-11-03 LAB — LIPID PANEL
Cholesterol: 146 mg/dL (ref 0–200)
HDL: 45 mg/dL (ref >40)
LDL Cholesterol: 80 mg/dL (ref 0–99)
Total CHOL/HDL Ratio: 3.2 RATIO
Triglycerides: 103 mg/dL (ref <150)
VLDL: 21 mg/dL (ref 0–40)

## 2021-11-03 LAB — BASIC METABOLIC PANEL
Anion gap: 8 (ref 5–15)
BUN: 18 mg/dL (ref 6–20)
CO2: 25 mmol/L (ref 22–32)
Calcium: 8.9 mg/dL (ref 8.9–10.3)
Chloride: 103 mmol/L (ref 98–111)
Creatinine, Ser: 0.94 mg/dL (ref 0.61–1.24)
GFR, Estimated: >60 mL/min (ref >60)
Glucose, Bld: 206 mg/dL — ABNORMAL HIGH (ref 70–99)
Potassium: 4.1 mmol/L (ref 3.5–5.1)
Sodium: 136 mmol/L (ref 135–145)

## 2021-11-03 LAB — BRAIN NATRIURETIC PEPTIDE: B Natriuretic Peptide: 49.6 pg/mL (ref 0.0–100.0)

## 2021-11-03 LAB — DIGOXIN LEVEL: Digoxin Level: 0.5 ng/mL — ABNORMAL LOW (ref 0.8–2.0)

## 2021-11-03 NOTE — Patient Instructions (Signed)
Medication Changes:  No changes  Lab Work:  Labs done today, your results will be available in MyChart, we will contact you for abnormal readings.   Testing/Procedures:  Your physician has requested that you have an echocardiogram. Echocardiography is a painless test that uses sound waves to create images of your heart. It provides your doctor with information about the size and shape of your heart and how well your hearts chambers and valves are working. This procedure takes approximately one hour. There are no restrictions for this procedure.   Referrals:  none  Special Instructions // Education:  none  Follow-Up in: 3 months   At the [facility], you and your health needs are our priority. We have a designated team specialized in the treatment of Heart Failure. This Care Team includes your primary Heart Failure Specialized Cardiologist (physician), Advanced Practice Providers (APPs- Physician Assistants and Nurse Practitioners), and Pharmacist who all work together to provide you with the care you need, when you need it.   You may see any of the following providers on your designated Care Team at your next follow up:  Dr Glori Bickers Dr Haynes Kerns, NP Lyda Jester, [place] [facility] [place], [place] Audry Riles, PharmD   Please be sure to bring in all your medications bottles to every appointment.   Need to Contact us:  If you have any questions or concerns before your next appointment please send [place] a message through [place] or call our office at [phone].    TO LEAVE A MESSAGE FOR THE NURSE SELECT OPTION 2, PLEASE LEAVE A MESSAGE INCLUDING: YOUR NAME DATE OF BIRTH CALL BACK NUMBER REASON FOR CALL**this is important as we prioritize the call backs  YOU WILL RECEIVE A CALL BACK THE SAME DAY AS LONG AS YOU CALL BEFORE 4:00 PM

## 2021-11-03 NOTE — Progress Notes (Signed)
ReDS Vest / Clip - 11/03/21 1100       ReDS Vest / Clip   Station Marker D    Ruler Value 33.5    ReDS Value Range Low volume    ReDS Actual Value 34

## 2021-11-09 ENCOUNTER — Encounter (HOSPITAL_COMMUNITY): Payer: Self-pay

## 2021-11-09 ENCOUNTER — Telehealth (HOSPITAL_COMMUNITY): Payer: Self-pay

## 2021-11-09 NOTE — Telephone Encounter (Addendum)
Letter sent  ----- Message from Rafael Bihari, FNP sent at 11/03/2021  1:36 PM EST ----- Lipids ok, but LDL not at goal of < 70. Please increase atorvastatin to 80 mg nightly. Will repeat lipids at next visit.

## 2021-11-15 NOTE — Progress Notes (Deleted)
Patient ID: Joseph [name], male    DOB: November 08, 1970, 51 y.o.   MRN: [id number]  HPI  Joseph [name] is a 51 y/o male with a history of asthma, CAD, DM, GERD, hyperlipidemia, HTN, obstructive sleep apnea, seizures & chronic heart failure.   Echo report from 10/14/21 reviewed and showed an EF of 25-30% along with mild/moderate Joseph. Echo report from 11/24/16 reviewed and showed an EF of 25-30% along with mild/moderate Joseph.   LHC on 11/23/16 showed: No high-grade coronary artery lesion to explain EKG changes and acute respiratory distress. There is moderate in-stent restenosis of the ostial/proximal LAD stent, as well as mild to moderate disease involving small jailed D1 and mid LAD. Severely reduced LV dysfunction with LVEF of approximately 25-30%. Mildly elevated left ventricular filling pressure, though evaluation is limited by significant respiratory variation.  Admitted 09/11/21 due to flu like symptoms. Hypoxic on room air due to influenza. Antibiotics given for presumed pneumonia. Able to be weaned off oxygen. PT eval done. Developed DKA which quickly resolved with IVF and insulin. Discharged after 6 days with home health PT. Was in the ED 09/05/21 due gastroenteritis where he was evaluated and released. Was in the ED 05/09/21 due to shortness of breath and headache. Given IV lasix with improvement of symptoms and he was released. Was in the ED 05/08/21 due to shortness of breath. Chest CTA done due to elevated d-dimer. Given IV lasix with improvement and subsequent increase of oral   He presents today for a follow-up visit with a chief complaint of   Past Medical History:  Diagnosis Date   AICD (automatic cardioverter/defibrillator) present 2011   Asthma    Cancer ([place])    prostate   CHF (congestive heart failure) ([place])    Coronary artery disease    Diabetes mellitus without complication ([place])    Dyspnea    ED (erectile dysfunction)    GERD (gastroesophageal reflux disease)    Hyperlipidemia     Hypertension    Ischemic cardiomyopathy    LADA (latent autoimmune diabetes in adults), managed as type 1 ([place])    Myocardial infarction ([place]) 2011   anterior MI at [place] s/p BMS to ostial/proximal LAD   Neuromuscular disorder ([place])    problems in arms. finger tips have been numb/tingling. voltaren works   Obstructive sleep apnea 2018   cpap. has not used lately. uses a breathe-right strip on nose   Seizures ([place])    diabetes seizures (low blood sugar); last one 29/2446   Systolic heart failure ([place])    Vitamin D deficiency    Past Surgical History:  Procedure Laterality Date   CARDIAC DEFIBRILLATOR PLACEMENT  08/30/2010   CHOLECYSTECTOMY N/A 11/07/2017   Procedure: LAPAROSCOPIC CHOLECYSTECTOMY;  Surgeon: Herbert Pun, MD;  Location: [facility] ORS;  Service: General;  Laterality: N/A;   CORONARY ANGIOPLASTY  2011   stent placed x 1   CYSTOSCOPY N/A 01/03/2020   Procedure: CYSTOSCOPY FLEXIBLE;  Surgeon: Hollice Espy, MD;  Location: [facility] ORS;  Service: Urology;  Laterality: N/A;   ESOPHAGOGASTRODUODENOSCOPY (EGD) WITH PROPOFOL N/A 03/20/2018   Procedure: ESOPHAGOGASTRODUODENOSCOPY (EGD) WITH PROPOFOL;  Surgeon: Manya Silvas, MD;  Location: [facility] ENDOSCOPY;  Service: Endoscopy;  Laterality: N/A;   LEFT HEART CATH AND CORONARY ANGIOGRAPHY N/A 11/23/2016   Procedure: Left Heart Cath and Coronary Angiography;  Surgeon: Nelva Bush, MD;  Location: [place] CV LAB;  Service: Cardiovascular;  Laterality: N/A;   PROSTATE BIOPSY  2019   PROSTATE BIOPSY N/A 10/23/2019  Procedure: PROSTATE BIOPSY;  Surgeon: Abbie Sons, MD;  Location: [facility] ORS;  Service: Urology;  Laterality: N/A;   ROBOT ASSISTED LAPAROSCOPIC RADICAL PROSTATECTOMY N/A 01/03/2020   Procedure: XI ROBOTIC ASSISTED LAPAROSCOPIC RADICAL PROSTATECTOMY;  Surgeon: Hollice Espy, MD;  Location: [facility] ORS;  Service: Urology;  Laterality: N/A;   TRANSRECTAL ULTRASOUND N/A 10/23/2019   Procedure: TRANSRECTAL ULTRASOUND;   Surgeon: Abbie Sons, MD;  Location: [facility] ORS;  Service: Urology;  Laterality: N/A;   WRIST SURGERY Right 2008   Family History  Problem Relation Age of Onset   Hypertension Mother    Heart attack Paternal Grandmother    Heart attack Father    Bladder Cancer Neg Hx    Kidney cancer Neg Hx    Prostate cancer Neg Hx    Social History   Tobacco Use   Smoking status: Never   Smokeless tobacco: Never  Substance Use Topics   Alcohol use: No   Allergies  Allergen Reactions   Vancomycin Shortness Of Breath   Lisinopril Rash    Review of Systems  Constitutional:  Positive for fatigue. Negative for appetite change (much better).  HENT:  Negative for congestion, postnasal drip, sneezing and sore throat.   Eyes: Negative.   Respiratory:  Positive for cough, shortness of breath and wheezing. Negative for chest tightness.   Cardiovascular:  Negative for chest pain, palpitations and leg swelling.  Gastrointestinal:  Negative for abdominal distention and abdominal pain.  Endocrine: Negative.   Genitourinary: Negative.   Musculoskeletal:  Negative for back pain and neck pain.  Skin: Negative.   Allergic/Immunologic: Negative.   Neurological:  Positive for dizziness (when changing positions or first thing in the morning when opening blinds) and weakness (legs much better). Negative for light-headedness.  Hematological:  Negative for adenopathy. Does not bruise/bleed easily.  Psychiatric/Behavioral:  Negative for dysphoric mood and sleep disturbance (sleeping on 2 pillows). The patient is not nervous/anxious.      Physical Exam Vitals and nursing note reviewed.  Constitutional:      Appearance: Normal appearance.  HENT:     Head: Normocephalic and atraumatic.  Cardiovascular:     Rate and Rhythm: Regular rhythm. Tachycardia present.  Pulmonary:     Effort: Pulmonary effort is normal. No respiratory distress.     Breath sounds: No wheezing or rales.  Abdominal:     General:  There is no distension.  Musculoskeletal:        General: No tenderness.     Cervical back: Normal range of motion and neck supple.     Right lower leg: Edema (trace pitting) present.     Left lower leg: Edema (trace pitting) present.  Skin:    General: Skin is warm and dry.  Neurological:     General: No focal deficit present.     Mental Status: He is alert and oriented to person, place, and time.  Psychiatric:        Mood and Affect: Mood normal.        Behavior: Behavior normal.        Thought Content: Thought content normal.   Assessment & Plan:  1: Chronic heart failure with reduced ejection fraction- - NYHA class II - euvolemic today - weighing daily; reminded to call for an overnight weight gain of > 2 pounds or a weekly weight gain of > 5 pounds - weight 206.8 pounds from last visit here 1 month ago - not adding salt to his food and has been trying  to read food labels for sodium content - has AICD present - saw cardiology ([place]) 08/19/21 - on GDMT of carvedilol, entresto and spironolactone - was [facility]; plan for possible CPX testing - type 1 DM so not a candidate for SGLT2 - saw pulmonology [facility]) 02/06/20 - walking without his walker now and is getting ready to return to work - BNP 11/03/21 was 49.6   2: HTN- - BP - saw PCP at [facility] 10/09/21 - BMP 11/03/21 reviewed and showed sodium 136, potassium 4.1, creatinine 0.94 and GFR >60  3: Type 1 DM- - A1c 09/11/21 was 10.9% - using sliding scale insulin along with tresiba once daily; says that he rarely checks his insulin prior to using sliding scale and says that he estimates what he needs - glucose at home today was  - discussed the importance of checking his glucose prior to using his sliding scale especially since he's having issues with dizziness first thing in the morning

## 2021-11-16 ENCOUNTER — Ambulatory Visit: Payer: BC Managed Care – PPO | Admitting: Family

## 2021-11-17 ENCOUNTER — Other Ambulatory Visit: Payer: Self-pay | Admitting: Family

## 2021-11-23 DIAGNOSIS — I255 Ischemic cardiomyopathy: Secondary | ICD-10-CM | POA: Diagnosis not present

## 2021-11-23 DIAGNOSIS — I10 Essential (primary) hypertension: Secondary | ICD-10-CM | POA: Diagnosis not present

## 2021-11-23 DIAGNOSIS — I2510 Atherosclerotic heart disease of native coronary artery without angina pectoris: Secondary | ICD-10-CM | POA: Diagnosis not present

## 2021-11-23 DIAGNOSIS — Z95810 Presence of automatic (implantable) cardiac defibrillator: Secondary | ICD-10-CM | POA: Diagnosis not present

## 2021-11-24 NOTE — Progress Notes (Signed)
11/25/21 4:46 PM   Joseph [name] 11/26/1970 [id number]  Referring provider:  Center, [facility] [place] [facility] [place],  [place] [address] Chief Complaint  Patient presents with   Prostate Cancer     HPI: Joseph [name] is a 51 y.o.male with a personal history of prostate cancer and stress incontinence, who presents today for follow-up to discuss prostate cancer.   Please [name] Dr. Dagoberto Reef notes for details. PSA of 9.5 and 4K with an 8% probability of Gleason 7 or greater prostate cancer.  The pathology result was discussed in detail.  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  3/12+ for Gleason 3+3 and 2/12+ for Gleason 3+4.   He had a XI robotic assisted laparoscopic radical prostatectomy on 01/03/20. His surgical pathology 11/07/19 gleason 3+4 with less than 5% of a tertiary pattern -EPE focal -urinary bladder neck -SV  +perinerual invasion +margin (right apical, left apical) - lymph nodes pT2 pN0.    PSA 0.02 as of 02/01/20.  Unfortunately, has been lost to follow-up since then.   He reports that due to insurance reasons he was unable to follow-up. He reports that he has not be able to gain an erection since surgery, he does not get any fullness. He is able to orgasm. He has tried generic Viagra but he was still not able to gain an erection.   He does mention today that he was actually scheduled for a penile prosthesis with Dr. Rhodia Albright several years prior to prostatectomy but ultimately ended up not undergoing this procedure.  PMH: Past Medical History:  Diagnosis Date   AICD (automatic cardioverter/defibrillator) present 2011   Asthma    Cancer ([place])    prostate   CHF (congestive heart failure) ([place])    Coronary artery disease    Diabetes mellitus without complication ([place])    Dyspnea    ED (erectile dysfunction)    GERD (gastroesophageal reflux disease)    Hyperlipidemia    Hypertension    Ischemic cardiomyopathy    LADA (latent autoimmune  diabetes in adults), managed as type 1 ([place])    Myocardial infarction ([place]) 2011   anterior MI at [place] s/p BMS to ostial/proximal LAD   Neuromuscular disorder ([place])    problems in arms. finger tips have been numb/tingling. voltaren works   Obstructive sleep apnea 2018   cpap. has not used lately. uses a breathe-right strip on nose   Seizures ([place])    diabetes seizures (low blood sugar); last one 22/4825   Systolic heart failure ([place])    Vitamin D deficiency     Surgical History: Past Surgical History:  Procedure Laterality Date   CARDIAC DEFIBRILLATOR PLACEMENT  08/30/2010   CHOLECYSTECTOMY N/A 11/07/2017   Procedure: LAPAROSCOPIC CHOLECYSTECTOMY;  Surgeon: Herbert Pun, MD;  Location: [facility] ORS;  Service: General;  Laterality: N/A;   CORONARY ANGIOPLASTY  2011   stent placed x 1   CYSTOSCOPY N/A 01/03/2020   Procedure: CYSTOSCOPY FLEXIBLE;  Surgeon: Hollice Espy, MD;  Location: [facility] ORS;  Service: Urology;  Laterality: N/A;   ESOPHAGOGASTRODUODENOSCOPY (EGD) WITH PROPOFOL N/A 03/20/2018   Procedure: ESOPHAGOGASTRODUODENOSCOPY (EGD) WITH PROPOFOL;  Surgeon: Manya Silvas, MD;  Location: [facility] ENDOSCOPY;  Service: Endoscopy;  Laterality: N/A;   LEFT HEART CATH AND CORONARY ANGIOGRAPHY N/A 11/23/2016   Procedure: Left Heart Cath and Coronary Angiography;  Surgeon: Nelva Bush, MD;  Location: [facility] CV LAB;  Service: Cardiovascular;  Laterality: N/A;   PROSTATE BIOPSY  2019  PROSTATE BIOPSY N/A 10/23/2019   Procedure: PROSTATE BIOPSY;  Surgeon: Abbie Sons, MD;  Location: [facility] ORS;  Service: Urology;  Laterality: N/A;   ROBOT ASSISTED LAPAROSCOPIC RADICAL PROSTATECTOMY N/A 01/03/2020   Procedure: XI ROBOTIC ASSISTED LAPAROSCOPIC RADICAL PROSTATECTOMY;  Surgeon: Hollice Espy, MD;  Location: [facility] ORS;  Service: Urology;  Laterality: N/A;   TRANSRECTAL ULTRASOUND N/A 10/23/2019   Procedure: TRANSRECTAL ULTRASOUND;  Surgeon: Abbie Sons, MD;  Location: [facility]  ORS;  Service: Urology;  Laterality: N/A;   WRIST SURGERY Right 2008    Home Medications:  Allergies as of 11/25/2021       Reactions   Vancomycin Shortness Of Breath   Lisinopril Rash        Medication List        Accurate as of November 25, 2021  4:46 PM. If you have any questions, ask your nurse or doctor.          STOP taking these medications    carvedilol 12.5 MG tablet Commonly known as: COREG Stopped by: Hollice Espy, MD   furosemide 20 MG tablet Commonly known as: LASIX Stopped by: Hollice Espy, MD   losartan 100 MG tablet Commonly known as: COZAAR Stopped by: Hollice Espy, MD       TAKE these medications    acetaminophen 500 MG tablet Commonly known as: TYLENOL Take 500 mg by mouth every 6 (six) hours as needed for fever.   albuterol 108 (90 Base) MCG/ACT inhaler Commonly known as: Proventil HFA Inhale 2 puffs into the lungs every 4 (four) hours as needed for wheezing or shortness of breath.   aspirin EC 81 MG tablet Take 81 mg by mouth every morning. What changed: Another medication with the same name was removed. Continue taking this medication, and follow the directions you [name] here. Changed by: Hollice Espy, MD   atorvastatin 40 MG tablet Commonly known as: LIPITOR Take 1 tablet by mouth daily. What changed: Another medication with the same name was removed. Continue taking this medication, and follow the directions you [name] here. Changed by: Hollice Espy, MD   B-D ULTRAFINE III SHORT PEN 31G X 8 MM Misc Generic drug: Insulin Pen Needle USE 5 TIMES DAILY AS  DIRECTED   Cholecalciferol 25 MCG (1000 UT) capsule Take 1,000 Units by mouth daily.   digoxin 0.125 MG tablet Commonly known as: LANOXIN Take by mouth. What changed: Another medication with the same name was removed. Continue taking this medication, and follow the directions you [name] here. Changed by: Hollice Espy, MD   Entresto 97-103 MG Generic drug:  sacubitril-valsartan TAKE 1 TABLET BY MOUTH TWICE A DAY. STOP LOSARTAN   fluticasone 50 MCG/ACT nasal spray Commonly known as: FLONASE Place 2 sprays into both nostrils daily as needed for allergies.   glucagon (human recombinant) 1 MG injection Commonly known as: GLUCAGEN Inject into the muscle as needed.   insulin aspart 100 UNIT/ML FlexPen Commonly known as: NOVOLOG Inject 18-20-22 u TID AC plus sliding scale for up to 75 units TDD   loratadine 10 MG tablet Commonly known as: CLARITIN Take 10 mg by mouth daily as needed.   naproxen sodium 220 MG tablet Commonly known as: ALEVE Take 220 mg by mouth daily as needed.   potassium chloride 10 MEQ tablet Commonly known as: KLOR-CON M Take 10 mEq by mouth daily.   spironolactone 25 MG tablet Commonly known as: ALDACTONE Take 0.5 tablets (12.5 mg total) by mouth daily.   Symbicort 160-4.5 MCG/ACT inhaler  Generic drug: budesonide-formoterol Inhale 2 puffs into the lungs 2 (two) times daily.   torsemide 20 MG tablet Commonly known as: DEMADEX Take 2 tablets (40 mg total) by mouth daily.   Tyler Aas FlexTouch 200 UNIT/ML FlexTouch Pen Generic drug: insulin degludec Inject 60 Units into the skin daily.        Allergies:  Allergies  Allergen Reactions   Vancomycin Shortness Of Breath   Lisinopril Rash    Family History: Family History  Problem Relation Age of Onset   Hypertension Mother    Heart attack Paternal Grandmother    Heart attack Father    Bladder Cancer Neg Hx    Kidney cancer Neg Hx    Prostate cancer Neg Hx     Social History:  reports that he has never smoked. He has never used smokeless tobacco. He reports that he does not drink alcohol and does not use drugs.   Physical Exam: BP 126/77    Pulse 80    Ht 6\' 1"  (1.854 m)    Wt 214 lb (97.1 kg)    BMI 28.23 kg/m   Constitutional:  Alert and oriented, No acute distress. HEENT: [place] AT, moist mucus membranes.  Trachea midline, no  masses. Cardiovascular: No clubbing, cyanosis, or edema. Respiratory: Normal respiratory effort, no increased work of breathing. Skin: No rashes, bruises or suspicious lesions. Neurologic: Grossly intact, no focal deficits, moving all 4 extremities. Psychiatric: Normal mood and affect.  Laboratory Data: Lab Results  Component Value Date   CREATININE 0.94 11/03/2021   Lab Results  Component Value Date   HGBA1C 10.9 (H) 09/11/2021    Assessment & Plan:    Prostate cancer  - S/p prostatectomy - Has not has a recent PSA since 2021; discussed with him  -We discussed the extreme importance of at least biannual PSA testing to ensure no evidence of biochemical recurrence - PSA; pending; will call him with results.  2. Erectile dysfunction  -Severe ED before surgery he actually was discussing penile prosthesis before in 2016  - We discussed a medication versus penile implant today versus Trimix injection versus a vacuum. He is interested in medication in the form of change to Cialis at this time. - Referral to [facility] for possible penile prosthesis   - Cialis prescribed   Return in 1 year for PSA   I,Kailey Littlejohn,acting as a scribe for Hollice Espy, MD.,have documented all relevant documentation on the behalf of Hollice Espy, MD,as directed by  Hollice Espy, MD while in the presence of Hollice Espy, MD.  I have reviewed the above documentation for accuracy and completeness, and I agree with the above.   Hollice Espy, MD   [facility] [address], [place], [place] [address] [phone]

## 2021-11-25 ENCOUNTER — Ambulatory Visit: Payer: BC Managed Care – PPO | Admitting: Urology

## 2021-11-25 ENCOUNTER — Other Ambulatory Visit: Payer: Self-pay

## 2021-11-25 VITALS — BP 126/77 | HR 80 | Ht 73.0 in | Wt 214.0 lb

## 2021-11-25 DIAGNOSIS — C61 Malignant neoplasm of prostate: Secondary | ICD-10-CM

## 2021-11-25 MED ORDER — TADALAFIL 20 MG PO TABS: 20.0000 mg | ORAL_TABLET | Freq: Every day | ORAL | 11 refills | Status: DC | PRN

## 2021-11-26 ENCOUNTER — Telehealth: Payer: Self-pay | Admitting: *Deleted

## 2021-11-26 LAB — PSA: Prostate Specific Ag, Serum: <0.1 ng/mL (ref 0.0–4.0)

## 2021-11-26 NOTE — Telephone Encounter (Addendum)
Patient informed, voiced understanding.    ----- Message from Hollice Espy, MD sent at 11/26/2021  8:09 AM EST ----- Thank goodness, PSA is undetectable.  Great news.    Hollice Espy, MD

## 2021-12-28 ENCOUNTER — Telehealth: Payer: Self-pay | Admitting: Urology

## 2021-12-29 DIAGNOSIS — A084 Viral intestinal infection, unspecified: Secondary | ICD-10-CM | POA: Diagnosis not present

## 2021-12-29 DIAGNOSIS — R519 Headache, unspecified: Secondary | ICD-10-CM | POA: Diagnosis not present

## 2022-01-06 ENCOUNTER — Telehealth: Payer: Self-pay

## 2022-01-06 DIAGNOSIS — Z0000 Encounter for general adult medical examination without abnormal findings: Secondary | ICD-10-CM | POA: Diagnosis not present

## 2022-01-06 DIAGNOSIS — Z0130 Encounter for examination of blood pressure without abnormal findings: Secondary | ICD-10-CM | POA: Diagnosis not present

## 2022-01-06 DIAGNOSIS — E11621 Type 2 diabetes mellitus with foot ulcer: Secondary | ICD-10-CM | POA: Diagnosis not present

## 2022-01-06 DIAGNOSIS — Z1389 Encounter for screening for other disorder: Secondary | ICD-10-CM | POA: Diagnosis not present

## 2022-01-06 DIAGNOSIS — I509 Heart failure, unspecified: Secondary | ICD-10-CM | POA: Diagnosis not present

## 2022-01-06 NOTE — Telephone Encounter (Signed)
Patient called to report that he has been having swelling in one foot and ankle since not wearing compression hose to work on Saturday, 3/25. He states that he is having a lot of pain that shoots up his leg into his groin associated with it and that the pain is getting worse. He asked about seeing provider today. Darylene Price, FNP, consulted and stated for the patient to follow up with PCP as soon as possible as his pain and symptoms sound like potentially something other than his heart failure. She advised patient to take an extra dose of torsemide and an extra potassium today as well to see if that helps his ankle swelling. Patient advised to call us back if another cause is ruled out or he continues to experience swelling, or begins to experience shortness of breath, weight gain or other heart failure related symptoms. Patient verbalized understanding and states he will call his primary doctor and take and extra dose of torsemide and potassium today.  ?Georg Ruddle, RN ?

## 2022-02-02 ENCOUNTER — Ambulatory Visit (HOSPITAL_BASED_OUTPATIENT_CLINIC_OR_DEPARTMENT_OTHER)
Admission: RE | Admit: 2022-02-02 | Discharge: 2022-02-02 | Disposition: A | Discharge status: Home / Self Care | Payer: BC Managed Care – PPO | Source: Ambulatory Visit | Attending: Cardiology | Admitting: Cardiology

## 2022-02-02 ENCOUNTER — Ambulatory Visit (HOSPITAL_COMMUNITY)
Admission: RE | Admit: 2022-02-02 | Discharge: 2022-02-02 | Disposition: A | Discharge status: Home / Self Care | Payer: BC Managed Care – PPO | Source: Ambulatory Visit | Attending: Internal Medicine | Admitting: Internal Medicine

## 2022-02-02 ENCOUNTER — Encounter (HOSPITAL_COMMUNITY): Payer: Self-pay

## 2022-02-02 ENCOUNTER — Encounter (HOSPITAL_COMMUNITY): Payer: Self-pay | Admitting: Cardiology

## 2022-02-02 VITALS — BP 130/70 | HR 86 | Wt 215.4 lb

## 2022-02-02 DIAGNOSIS — E785 Hyperlipidemia, unspecified: Secondary | ICD-10-CM | POA: Insufficient documentation

## 2022-02-02 DIAGNOSIS — K828 Other specified diseases of gallbladder: Secondary | ICD-10-CM | POA: Insufficient documentation

## 2022-02-02 DIAGNOSIS — I5022 Chronic systolic (congestive) heart failure: Secondary | ICD-10-CM | POA: Insufficient documentation

## 2022-02-02 LAB — BASIC METABOLIC PANEL
Anion gap: 9 (ref 5–15)
BUN: 20 mg/dL (ref 6–20)
CO2: 26 mmol/L (ref 22–32)
Calcium: 8.7 mg/dL — ABNORMAL LOW (ref 8.9–10.3)
Chloride: 103 mmol/L (ref 98–111)
Creatinine, Ser: 1.4 mg/dL — ABNORMAL HIGH (ref 0.61–1.24)
GFR, Estimated: >60 mL/min (ref >60)
Glucose, Bld: 116 mg/dL — ABNORMAL HIGH (ref 70–99)
Potassium: 4.2 mmol/L (ref 3.5–5.1)
Sodium: 138 mmol/L (ref 135–145)

## 2022-02-02 LAB — BRAIN NATRIURETIC PEPTIDE: B Natriuretic Peptide: 20.6 pg/mL (ref 0.0–100.0)

## 2022-02-02 MED ORDER — CARVEDILOL 12.5 MG PO TABS: 18.7500 mg | ORAL_TABLET | Freq: Two times a day (BID) | ORAL | 6 refills | Status: DC

## 2022-02-02 MED ORDER — SPIRONOLACTONE 25 MG PO TABS: 25.0000 mg | ORAL_TABLET | Freq: Every day | ORAL | 3 refills | Status: DC

## 2022-02-02 MED ORDER — ATORVASTATIN CALCIUM 80 MG PO TABS: 80.0000 mg | ORAL_TABLET | Freq: Every day | ORAL | 3 refills | Status: DC

## 2022-02-02 NOTE — Patient Instructions (Signed)
Medication Changes: ? ?Increase Carvedilol to 18.75 mg (1 1/2 Tab) Twice daily ? ?Increase Spironolactone to 25 mg daily ? ?Increase Atorvastatin '80mg'$  daily  ? ?Lab Work: ? ?Labs done today, your results will be available in MyChart, we will contact you for abnormal readings. ? ? ?Testing/Procedures: ? ?Your physician has recommended that you have a cardiopulmonary stress test (CPX). CPX testing is a non-invasive measurement of heart and lung function. It replaces a traditional treadmill stress test. This type of test provides a tremendous amount of information that relates not only to your present condition but also for future outcomes. This test combines measurements of you ventilation, respiratory gas exchange in the lungs, electrocardiogram (EKG), blood pressure and physical response before, during, and following an exercise protocol. ? ? ?Referrals: ? ?Please follow up with our heart failure pharmacist in as scheduled ? ? ?Special Instructions // Education: ? ?none ? ?Follow-Up in: 3 months  ? ?At the [facility], you and your health needs are our priority. We have a designated team specialized in the treatment of Heart Failure. This Care Team includes your primary Heart Failure Specialized Cardiologist (physician), Advanced Practice Providers (APPs- Physician Assistants and Nurse Practitioners), and Pharmacist who all work together to provide you with the care you need, when you need it.  ? ?You may see any of the following providers on your designated Care Team at your next follow up: ? ?Dr Glori Bickers ?Dr Loralie Champagne ?Darrick Grinder, NP ?Lyda Jester, PA ?Jessica Milford,NP ?Marlyce Huge, PA ?Audry Riles, PharmD ? ? ?Please be sure to bring in all your medications bottles to every appointment.  ? ?Need to Contact us: ? ?If you have any questions or concerns before your next appointment please send [place] a message through [place] or call our office at [phone].   ? ?TO LEAVE A  MESSAGE FOR THE NURSE SELECT OPTION 2, PLEASE LEAVE A MESSAGE INCLUDING: ?YOUR NAME ?DATE OF BIRTH ?CALL BACK NUMBER ?REASON FOR CALL**this is important as we prioritize the call backs ? ?YOU WILL RECEIVE A CALL BACK THE SAME DAY AS LONG AS YOU CALL BEFORE 4:00 PM ? ? ?

## 2022-02-03 NOTE — Progress Notes (Signed)
PCP: Center, [facility] ?HF Cardiology: Dr. Aundra Dubin ? ?51 y.o. with history of type 1 DM, HTN, CAD, and chronic systolic CHF presents for followup of CHF.  Patient had anterior STEMI with DES to proximal LAD in 6/11.  Echo at the time with EF < 35%.  Patient eventually had [employer] ICD placed.  LV systolic function has been low since 6/11 MI.  He presented again in 2/18 with chest pain and dyspnea as well as anterior STE on ECG, but cath showed only moderate proximal LAD in-stent restenosis, not explaining his ECG changes.  Most recent echo in 1/23 showed EF 25-30%, normal RV, mild-moderate MR. Patient reports that his father had an MI, but actually after he had his first MI.  He is not a smoker.  ? ?Patient reports no chest pain.  He reports rare palpitations.  He is short of breath walking up a flight of stairs.  No dyspnea walking on flat ground.  He has slept on 2 pillows for years.  He works full time as a Patent attorney.  No lightheadedness or syncope.   ? ?ECG (personally reviewed): NSR, IVCD 126 msec ? ?Labs (1/23): LDL 80, BNP 50, digoxin 0.5, K 4.1, creatinine 0.94 ? ?PMH: ?1. Type 1 diabetes: Diagnosis in 1997. H/o DKA.  ?2. GERD ?3. Hyperlipidemia ?4. HTN ?5. OSA: Uses CPAP ?6. H/o seizure ?7. CAD: 6/11 anterior MI with PCI to LAD.  ?- 2/18 STEMI by ECG => cath with moderate in-stent restenosis in the ostial/proximal LAD stent but did not explain ECG changes (?recanalized).   ?8. Chronic systolic CHF: Ischemic cardiomyopathy. [place].  ?- Echo (6/11): EF < 35%.  ?- Echo (2017): EF 40% ?- Echo (2/18): EF 25-30% ?- Echo (1/23): EF 25-30%, normal RV, mild-moderate MR.  ? ?Social History  ? ?Socioeconomic History  ? Marital status: Divorced  ?  Spouse name: Not on file  ? Number of children: Not on file  ? Years of education: Not on file  ? Highest education level: Not on file  ?Occupational History  ? Occupation: Glass blower/designer  ?Tobacco Use  ? Smoking status: Never   ? Smokeless tobacco: Never  ?Vaping Use  ? Vaping Use: Never used  ?Substance and Sexual Activity  ? Alcohol use: No  ? Drug use: No  ? Sexual activity: Yes  ?Other Topics Concern  ? Not on file  ?Social History Narrative  ? Not on file  ? ?Social Determinants of Health  ? ?Financial Resource Strain: Not on file  ?Food Insecurity: Not on file  ?Transportation Needs: Not on file  ?Physical Activity: Not on file  ?Stress: Not on file  ?Social Connections: Not on file  ?Intimate Partner Violence: Not on file  ? ?Family History  ?Problem Relation Age of Onset  ? Hypertension Mother   ? Heart attack Paternal Grandmother   ? Heart attack Father   ? Bladder Cancer Neg Hx   ? Kidney cancer Neg Hx   ? Prostate cancer Neg Hx   ? ?ROS: all systems reviewed and negative except as per HPI.  ? ?Current Outpatient Medications  ?Medication Sig Dispense Refill  ? acetaminophen (TYLENOL) 500 MG tablet Take 500 mg by mouth every 6 (six) hours as needed for fever.    ? albuterol (PROVENTIL HFA) 108 (90 Base) MCG/ACT inhaler Inhale 2 puffs into the lungs every 4 (four) hours as needed for wheezing or shortness of breath. 1 Inhaler 0  ? aspirin  EC 81 MG tablet Take 81 mg by mouth every morning.    ? Cholecalciferol 25 MCG (1000 UT) capsule Take 1,000 Units by mouth daily.    ? digoxin (LANOXIN) 0.125 MG tablet Take 0.125 mg by mouth daily.    ? ENTRESTO 97-103 MG TAKE 1 TABLET BY MOUTH TWICE A DAY. STOP LOSARTAN 60 tablet 5  ? fluticasone (FLONASE) 50 MCG/ACT nasal spray Place 2 sprays into both nostrils daily as needed for allergies.     ? glucagon, human recombinant, (GLUCAGEN) 1 MG injection Inject into the muscle as needed.    ? insulin aspart (NOVOLOG) 100 UNIT/ML FlexPen Inject 18-20-22 u TID AC plus sliding scale for up to 75 units TDD    ? insulin degludec (TRESIBA FLEXTOUCH) 200 UNIT/ML FlexTouch Pen Inject 60 Units into the skin daily.    ? Insulin Pen Needle (B-D ULTRAFINE III SHORT PEN) 31G X 8 MM MISC USE 5 TIMES DAILY AS   DIRECTED 75 each 0  ? loperamide (IMODIUM) 2 MG capsule PLEASE SEE ATTACHED FOR DETAILED DIRECTIONS    ? loratadine (CLARITIN) 10 MG tablet Take 10 mg by mouth daily as needed.     ? naproxen sodium (ALEVE) 220 MG tablet Take 220 mg by mouth daily as needed.    ? potassium chloride (KLOR-CON) 10 MEQ tablet Take 10 mEq by mouth daily.    ? SYMBICORT 160-4.5 MCG/ACT inhaler Inhale 2 puffs into the lungs 2 (two) times daily.    ? tadalafil (CIALIS) 20 MG tablet Take 1 tablet (20 mg total) by mouth daily as needed for erectile dysfunction. 30 tablet 11  ? torsemide (DEMADEX) 20 MG tablet Take 2 tablets (40 mg total) by mouth daily. 60 tablet 5  ? traMADol (ULTRAM) 50 MG tablet Take 50 mg by mouth every 4 (four) hours as needed.    ? atorvastatin (LIPITOR) 80 MG tablet Take 1 tablet (80 mg total) by mouth daily. 90 tablet 3  ? carvedilol (COREG) 12.5 MG tablet Take 1.5 tablets (18.75 mg total) by mouth 2 (two) times daily with a meal. 140 tablet 6  ? spironolactone (ALDACTONE) 25 MG tablet Take 1 tablet (25 mg total) by mouth daily. 90 tablet 3  ? ?No current facility-administered medications for this encounter.  ? ?BP 130/70   Pulse 86   Wt 97.7 kg (215 lb 6.4 oz)   SpO2 95%   BMI 28.42 kg/m?  ?General: NAD ?Neck: No JVD, no thyromegaly or thyroid nodule.  ?Lungs: Clear to auscultation bilaterally with normal respiratory effort. ?CV: Nondisplaced PMI.  Heart regular S1/S2, no S3/S4, no murmur.  No peripheral edema.  No carotid bruit.  Normal pedal pulses.  ?Abdomen: Soft, nontender, no hepatosplenomegaly, no distention.  ?Skin: Intact without lesions or rashes.  ?Neurologic: Alert and oriented x 3.  ?Psych: Normal affect. ?Extremities: No clubbing or cyanosis.  ?HEENT: Normal.  ? ?Assessment/Plan: ?1. CAD: S/p anterior MI in 2011.  No recent chest pain.  ?- Continue ASA 81 daily.  ?- Recent LDL was 80 with goal < 55, will increase atorvastatin to 80 mg daily with lipids/LFTs in 2 months.  ?2. Chronic systolic CHF:  Ischemic cardiomyopathy with [place].  EF has been low since 2011.  Most recent echo in 1/23 with EF 25-30%, normal RV, mild-moderate MR. NYHA class II symptoms.  He is not volume overloaded on exam today.  ?- Increase Coreg to 18.75 mg bid.  ?- Increase spironolactone to 25 mg daily. BMET/BNP today  and in 10 days.  ?- Continue digoxin, check level today.  ?- Continue Entresto 97/103 bid.   ?- No SGLT2 inhibitor due to type 1 diabetes.  ?- I will arrange for CPX given long-standing cardiomyopathy.  ?3. Type 1 diabetes: Per primary provider.  ? ?Followup in 3 wks with HF pharmacist for med titration (increase Coreg to goal), followup with me in 3 months.  ? ?Joseph [name] ?02/03/2022 ? ?

## 2022-02-17 ENCOUNTER — Ambulatory Visit (HOSPITAL_COMMUNITY): Payer: BC Managed Care – PPO | Attending: Cardiology

## 2022-02-17 ENCOUNTER — Other Ambulatory Visit (HOSPITAL_COMMUNITY): Payer: Self-pay | Admitting: *Deleted

## 2022-02-17 DIAGNOSIS — I255 Ischemic cardiomyopathy: Secondary | ICD-10-CM | POA: Diagnosis not present

## 2022-02-17 DIAGNOSIS — I110 Hypertensive heart disease with heart failure: Secondary | ICD-10-CM | POA: Diagnosis not present

## 2022-02-17 DIAGNOSIS — G4733 Obstructive sleep apnea (adult) (pediatric): Secondary | ICD-10-CM | POA: Diagnosis not present

## 2022-02-17 DIAGNOSIS — I5022 Chronic systolic (congestive) heart failure: Secondary | ICD-10-CM | POA: Insufficient documentation

## 2022-02-17 DIAGNOSIS — I340 Nonrheumatic mitral (valve) insufficiency: Secondary | ICD-10-CM | POA: Insufficient documentation

## 2022-02-17 DIAGNOSIS — Z95810 Presence of automatic (implantable) cardiac defibrillator: Secondary | ICD-10-CM | POA: Diagnosis not present

## 2022-02-17 DIAGNOSIS — E109 Type 1 diabetes mellitus without complications: Secondary | ICD-10-CM | POA: Diagnosis not present

## 2022-02-17 DIAGNOSIS — Z8669 Personal history of other diseases of the nervous system and sense organs: Secondary | ICD-10-CM | POA: Insufficient documentation

## 2022-02-17 DIAGNOSIS — I2510 Atherosclerotic heart disease of native coronary artery without angina pectoris: Secondary | ICD-10-CM | POA: Diagnosis not present

## 2022-02-17 DIAGNOSIS — E785 Hyperlipidemia, unspecified: Secondary | ICD-10-CM | POA: Diagnosis not present

## 2022-02-17 DIAGNOSIS — I252 Old myocardial infarction: Secondary | ICD-10-CM | POA: Insufficient documentation

## 2022-02-26 NOTE — Progress Notes (Incomplete)
***In Progress***    Advanced Heart Failure Clinic Note   PCP: Center, [facility] HF Cardiology: Dr. Aundra Dubin  HPI:  51 y.o. with history of type 1 DM, HTN, CAD, and chronic systolic CHF presents for followup of CHF.  Patient had anterior STEMI with DES to proximal LAD in 03/2010.  Echo at the time with EF < 35%.  Patient eventually had [employer] ICD placed.  LV systolic function has been low since 03/2010 MI.  He presented again in 11/2016 with chest pain and dyspnea as well as anterior STE on ECG, but cath showed only moderate proximal LAD in-stent restenosis, not explaining his ECG changes.  Most recent echo in 10/2021 showed EF 25-30%, normal RV, mild-moderate MR. Patient reports that his father had an MI, but actually after he had his first MI.  He is not a smoker.    He was seen by Dr. Aundra Dubin on 02/02/2022 and reported no chest pain, rare palpitations and shortness of breath walking up a flight of stairs.  Denied dyspnea walking on flat ground.  He has slept on 2 pillows for years.  He works full time as a Patent attorney.  Denied any lightheadedness or syncope.  Today he returns to HF clinic for pharmacist medication titration. At last visit with MD spironolactone was increased from 12.5 mg to 25 mg daily, carvedilol was increased from XX to 18.75 mg BID, and atorvastatin was increased from XX to 80 mg daily.   Overall feeling ***. Dizziness, lightheadedness, fatigue:  Chest pain or palpitations:  How is your breathing?: *** SOB: Able to complete all ADLs. Activity level ***  Weight at home pounds. Takes furosemide/torsemide/bumex *** mg *** daily.  LEE PND/Orthopnea  Appetite *** Low-salt diet:   Physical Exam Cost/affordability of meds   HF Medications: Carvedilol 18.75 mg bid Entresto 97/103 bid  spironolactone to 25 mg daily Digoxin 0.125 mg daily  Has the patient been experiencing any side effects to the medications prescribed?  {YES  NO:22349}  Does the patient have any problems obtaining medications due to transportation or finances?   {YES NO:22349}  Understanding of regimen: {excellent/good/fair/poor:19665} Understanding of indications: {excellent/good/fair/poor:19665} Potential of compliance: {excellent/good/fair/poor:19665} Patient understands to avoid NSAIDs. Patient understands to avoid decongestants.    Pertinent Lab Values: 02/02/2022: Serum creatinine 1.40, BUN 20, Potassium 4.2, Sodium 138, BNP 20.6  Vital Signs: Weight: *** (last clinic weight: 215.4 lbs) Blood pressure: *** 130/70 Heart rate: *** 86  Plan Bmet today - none since [facility] Carvedilol 25 mg bid   Assessment/Plan: 1. CAD: S/p anterior MI in 2011.  No recent chest pain.  - Continue ASA 81 daily.  - LDL 80 (10/2021) with goal < 55 - Continue atorvastatin 80 mg daily with lipids/LFTs in 1 month.   2. Chronic systolic CHF: Ischemic cardiomyopathy with [place].  EF has been low since 2011.  Most recent echo in 10/2021 with EF 25-30%, normal RV, mild-moderate MR.  NYHA class II symptoms.  He is not volume overloaded on exam today. *** - Increase Coreg to 18.75 mg bid.  - Increase spironolactone to 25 mg daily. BMET/BNP today and in 10 days.  - Continue digoxin, check level today.  - Continue Entresto 97/103 bid.   - No SGLT2 inhibitor due to type 1 diabetes.   3. Type 1 diabetes: Per primary provider.    Follow up 2 months with Dr. Rush Farmer, PharmD, BCPS, [facility], CPP Heart Failure Clinic Pharmacist [phone]

## 2022-03-01 ENCOUNTER — Telehealth (HOSPITAL_COMMUNITY): Payer: Self-pay | Admitting: *Deleted

## 2022-03-01 ENCOUNTER — Inpatient Hospital Stay (HOSPITAL_COMMUNITY): Admission: RE | Admit: 2022-03-01 | Payer: BC Managed Care – PPO | Source: Ambulatory Visit

## 2022-03-01 NOTE — Telephone Encounter (Signed)
Called patient per Dr. Aundra Dubin re: CPX results; no answer and unable to leave message. Spoke with patient's mother and asked her to have him call HF clinic for results.   Zada Girt RN Heart Failure [phone]

## 2022-03-01 NOTE — Telephone Encounter (Signed)
-----   Message from Larey Dresser, MD sent at 02/17/2022 11:12 PM EDT ----- Mild HF limitation, but also has limitations due to deconditioning and restrictive lung disease.

## 2022-03-02 ENCOUNTER — Encounter (HOSPITAL_COMMUNITY): Payer: Self-pay | Admitting: Cardiology

## 2022-03-23 DIAGNOSIS — I255 Ischemic cardiomyopathy: Secondary | ICD-10-CM | POA: Diagnosis not present

## 2022-03-23 DIAGNOSIS — Z95810 Presence of automatic (implantable) cardiac defibrillator: Secondary | ICD-10-CM | POA: Diagnosis not present

## 2022-03-23 DIAGNOSIS — I2101 ST elevation (STEMI) myocardial infarction involving left main coronary artery: Secondary | ICD-10-CM | POA: Diagnosis not present

## 2022-03-23 DIAGNOSIS — I2510 Atherosclerotic heart disease of native coronary artery without angina pectoris: Secondary | ICD-10-CM | POA: Diagnosis not present

## 2022-04-29 ENCOUNTER — Telehealth (HOSPITAL_COMMUNITY): Payer: Self-pay

## 2022-04-29 NOTE — Telephone Encounter (Signed)
Called and was unable to leave patient a voice message to confirm/remind patient of their appointment at the [facility] on 05/07/22.

## 2022-05-07 ENCOUNTER — Encounter (HOSPITAL_COMMUNITY): Payer: BC Managed Care – PPO | Admitting: Cardiology

## 2022-05-21 ENCOUNTER — Emergency Department: Payer: BC Managed Care – PPO

## 2022-05-21 ENCOUNTER — Other Ambulatory Visit: Payer: Self-pay

## 2022-05-21 ENCOUNTER — Emergency Department
Admission: EM | Admit: 2022-05-21 | Discharge: 2022-05-21 | Disposition: A | Discharge status: Home / Self Care | Payer: BC Managed Care – PPO | Attending: Emergency Medicine | Admitting: Emergency Medicine

## 2022-05-21 ENCOUNTER — Encounter: Payer: Self-pay | Admitting: Emergency Medicine

## 2022-05-21 DIAGNOSIS — R0600 Dyspnea, unspecified: Secondary | ICD-10-CM | POA: Diagnosis not present

## 2022-05-21 DIAGNOSIS — J45909 Unspecified asthma, uncomplicated: Secondary | ICD-10-CM | POA: Insufficient documentation

## 2022-05-21 DIAGNOSIS — I5043 Acute on chronic combined systolic (congestive) and diastolic (congestive) heart failure: Secondary | ICD-10-CM | POA: Insufficient documentation

## 2022-05-21 DIAGNOSIS — R0602 Shortness of breath: Secondary | ICD-10-CM | POA: Insufficient documentation

## 2022-05-21 DIAGNOSIS — I509 Heart failure, unspecified: Secondary | ICD-10-CM | POA: Diagnosis not present

## 2022-05-21 DIAGNOSIS — Z950 Presence of cardiac pacemaker: Secondary | ICD-10-CM | POA: Diagnosis not present

## 2022-05-21 DIAGNOSIS — I110 Hypertensive heart disease with heart failure: Secondary | ICD-10-CM | POA: Diagnosis not present

## 2022-05-21 LAB — COMPREHENSIVE METABOLIC PANEL
ALT: 27 U/L (ref 0–44)
AST: 25 U/L (ref 15–41)
Albumin: 3.3 g/dL — ABNORMAL LOW (ref 3.5–5.0)
Alkaline Phosphatase: 117 U/L (ref 38–126)
Anion gap: 6 (ref 5–15)
BUN: 12 mg/dL (ref 6–20)
CO2: 23 mmol/L (ref 22–32)
Calcium: 8.2 mg/dL — ABNORMAL LOW (ref 8.9–10.3)
Chloride: 108 mmol/L (ref 98–111)
Creatinine, Ser: 0.95 mg/dL (ref 0.61–1.24)
GFR, Estimated: >60 mL/min (ref >60)
Glucose, Bld: 302 mg/dL — ABNORMAL HIGH (ref 70–99)
Potassium: 3.7 mmol/L (ref 3.5–5.1)
Sodium: 137 mmol/L (ref 135–145)
Total Bilirubin: 0.9 mg/dL (ref 0.3–1.2)
Total Protein: 6.9 g/dL (ref 6.5–8.1)

## 2022-05-21 LAB — CBC WITH DIFFERENTIAL/PLATELET
Abs Immature Granulocytes: 0.02 10*3/uL (ref 0.00–0.07)
Basophils Absolute: 0 10*3/uL (ref 0.0–0.1)
Basophils Relative: 0 %
Eosinophils Absolute: 0 10*3/uL (ref 0.0–0.5)
Eosinophils Relative: 0 %
HCT: 37.9 % — ABNORMAL LOW (ref 39.0–52.0)
Hemoglobin: 12.7 g/dL — ABNORMAL LOW (ref 13.0–17.0)
Immature Granulocytes: 0 %
Lymphocytes Relative: 19 %
Lymphs Abs: 1.3 10*3/uL (ref 0.7–4.0)
MCH: 30.1 pg (ref 26.0–34.0)
MCHC: 33.5 g/dL (ref 30.0–36.0)
MCV: 89.8 fL (ref 80.0–100.0)
Monocytes Absolute: 0.7 10*3/uL (ref 0.1–1.0)
Monocytes Relative: 10 %
Neutro Abs: 4.8 10*3/uL (ref 1.7–7.7)
Neutrophils Relative %: 71 %
Platelets: 175 10*3/uL (ref 150–400)
RBC: 4.22 MIL/uL (ref 4.22–5.81)
RDW: 12.9 % (ref 11.5–15.5)
WBC: 6.8 10*3/uL (ref 4.0–10.5)
nRBC: 0 % (ref 0.0–0.2)

## 2022-05-21 LAB — BRAIN NATRIURETIC PEPTIDE: B Natriuretic Peptide: 112.4 pg/mL — ABNORMAL HIGH (ref 0.0–100.0)

## 2022-05-21 LAB — TROPONIN I (HIGH SENSITIVITY)
Troponin I (High Sensitivity): 15 ng/L (ref <18)
Troponin I (High Sensitivity): 15 ng/L (ref <18)

## 2022-05-21 MED ORDER — FUROSEMIDE 10 MG/ML IJ SOLN
80.0000 mg | Freq: Once | INTRAMUSCULAR | Status: AC
  Administered 2022-05-21: 80 mg via INTRAVENOUS
  Filled 2022-05-21: qty 8

## 2022-05-21 MED ORDER — IPRATROPIUM-ALBUTEROL 0.5-2.5 (3) MG/3ML IN SOLN: 3.0000 mL | Freq: Once | RESPIRATORY_TRACT | Status: DC

## 2022-05-21 MED ORDER — FUROSEMIDE 10 MG/ML IJ SOLN: 40.0000 mg | Freq: Once | INTRAMUSCULAR | Status: DC

## 2022-05-21 MED ORDER — IPRATROPIUM-ALBUTEROL 0.5-2.5 (3) MG/3ML IN SOLN
3.0000 mL | Freq: Once | RESPIRATORY_TRACT | Status: AC
  Administered 2022-05-21: 3 mL via RESPIRATORY_TRACT
  Filled 2022-05-21: qty 3

## 2022-05-21 MED ORDER — TORSEMIDE 20 MG PO TABS: 40.0000 mg | ORAL_TABLET | Freq: Every day | ORAL | 0 refills | Status: DC

## 2022-05-21 MED ORDER — POTASSIUM CHLORIDE CRYS ER 20 MEQ PO TBCR
20.0000 meq | EXTENDED_RELEASE_TABLET | Freq: Once | ORAL | Status: AC
  Administered 2022-05-21: 20 meq via ORAL
  Filled 2022-05-21: qty 1

## 2022-05-21 NOTE — ED Provider Notes (Signed)
Assumed care from Dr. Karma Greaser at 7 AM. Briefly, the patient is a 51 y.o. male with PMHx of  has a past medical history of AICD (automatic cardioverter/defibrillator) present (2011), Asthma, Cancer ([place]), CHF (congestive heart failure) ([place]), Coronary artery disease, Diabetes mellitus without complication ([place]), Dyspnea, ED (erectile dysfunction), GERD (gastroesophageal reflux disease), Hyperlipidemia, Hypertension, Ischemic cardiomyopathy, LADA (latent autoimmune diabetes in adults), managed as type 1 ([place]), Myocardial infarction ([place]) (2011), Neuromuscular disorder ([place]), Obstructive sleep apnea (2018), Seizures ([place]), Systolic heart failure ([place]), and Vitamin D deficiency. here with SOB, cough.   Labs Reviewed  CBC WITH DIFFERENTIAL/PLATELET - Abnormal; Notable for the following components:      Result Value   Hemoglobin 12.7 (*)    HCT 37.9 (*)    All other components within normal limits  COMPREHENSIVE METABOLIC PANEL - Abnormal; Notable for the following components:   Glucose, Bld 302 (*)    Calcium 8.2 (*)    Albumin 3.3 (*)    All other components within normal limits  BRAIN NATRIURETIC PEPTIDE - Abnormal; Notable for the following components:   B Natriuretic Peptide 112.4 (*)    All other components within normal limits  TROPONIN I (HIGH SENSITIVITY)  TROPONIN I (HIGH SENSITIVITY)    Course of Care: -CXR clear. Labs reviewed, BNP above baseline. CBC without significant leukocytosis. CMP unremarkable with exception of hyperglycemia but no signs of DKA. Trop 15, EKG nonischemic. On my exam, pt has b/l rales c/w likely CHF. Will give duoneb, lasix, and plan to reassess. -Pt feels better after lasix and is now ambulating w/o difficulty. Trops stable. Will d/c with rx for his torsemide, and outpt CHF clinic follow-up.     Duffy Bruce, MD 05/21/22 [phone]

## 2022-05-21 NOTE — Discharge Instructions (Addendum)
For your medications:  I have called in another Rx for Torsemide.   STOP the Furosemide and resume the Torsemide 40 mg daily.  I'd recommend taking TORSEMIDE 40 MG TONIGHT then resuming your daily dose tomorrow as usual.  Take a potassium supplement with this diuretic Torsemide as prescribed.

## 2022-05-21 NOTE — ED Triage Notes (Addendum)
Patient ambulatory to triage with steady gait, without difficulty, audible wheezing and cough noted; pt reports SHOB and prod cough clear sputum; st hx CHF

## 2022-05-21 NOTE — ED Provider Notes (Signed)
[facility] Provider Note    Event Date/Time   First MD Initiated Contact with Patient 05/21/22 0630     (approximate)   History   Shortness of Breath   HPI  Joseph [name] is a 51 y.o. male who presents for evaluation of gradually worsening shortness of breath over the last month with acute worsening today.    He reports a history of CHF with a significantly decreased ejection fraction, followed by both Darylene Price and Dr. Saralyn Pilar as well as another cardiologist in [place] at [place].  He has a defibrillator implanted in his left chest.  He also reports being told in the past that he has asthma and he frequently uses inhalers.  They did not help him this morning.  He reports that over the last month he has noticed more trouble at night when he is sleeping and sometimes he has to sleep sitting up.  He also gets much more short of breath with exertion.  He said that he recently ran out of his torsemide and since he no longer had that medication, he has been taking furosemide instead.  He said he is compliant with his medications.  He has not noticed a weight gain.  He does not have swelling in his lower extremities but he also wears compression stockings.  The gradually worsening of the shortness of breath seem to get much worse this morning.  He said that usually when he first gets up in the morning he coughs and has difficulty breathing but it woke him up at 1 AM today and he was not able to get better and so he finally came to the emergency department.  He denies any recent fever or chills.  He denies chest pain, just feels a tightness associated with the shortness of breath.  He denies nausea, vomiting, and abdominal pain.     Physical Exam   Triage Vital Signs: ED Triage Vitals  Enc Vitals Group     BP 05/21/22 0625 (!) 156/88     Pulse Rate 05/21/22 0625 82     Resp 05/21/22 0625 (!) 22     Temp 05/21/22 0625 98.1 F (36.7 C)     Temp Source  05/21/22 0625 Oral     SpO2 05/21/22 0625 99 %     Weight 05/21/22 0616 97.5 kg (215 lb)     Height 05/21/22 0616 1.854 m ('6\' 1"'$ )     Head Circumference --      Peak Flow --      Pain Score 05/21/22 0616 0     Pain Loc --      Pain Edu? --      Excl. in [place]? --     Most recent vital signs: Vitals:   05/21/22 0625 05/21/22 0630  BP: (!) 156/88 (!) 146/80  Pulse: 82 81  Resp: (!) 22 17  Temp: 98.1 F (36.7 C)   SpO2: 99% 99%     General: Awake, mild respiratory distress, but awake and alert and communicative. CV:  Good peripheral perfusion.  Defibrillator in place on left chest.  Normal heart sounds. Resp:  Slightly increased respiratory effort and mild tachypnea.  No wheezing upon auscultation.  Mild accessory muscle usage.  Speaking in full sentences. Abd:  No distention.  No tenderness to palpation of the abdomen. Other:  Mood and affect normal under the circumstances.  No focal neurological deficits.   ED Results / Procedures / Treatments  Labs (all labs ordered are listed, but only abnormal results are displayed) Labs Reviewed  CBC WITH DIFFERENTIAL/PLATELET - Abnormal; Notable for the following components:      Result Value   Hemoglobin 12.7 (*)    HCT 37.9 (*)    All other components within normal limits  COMPREHENSIVE METABOLIC PANEL  BRAIN NATRIURETIC PEPTIDE  TROPONIN I (HIGH SENSITIVITY)     EKG  ED ECG REPORT I, Hinda Kehr, the attending physician, personally viewed and interpreted this ECG.  Date: 05/21/2022 EKG Time: 6:22 AM Rate: 81 Rhythm: normal sinus rhythm QRS Axis: LAD Intervals: ICVD with LVH ST/T Wave abnormalities: Non-specific ST segment / T-wave changes, but no clear evidence of acute ischemia. Narrative Interpretation: no definitive evidence of acute ischemia; does not meet STEMI criteria.    RADIOLOGY I viewed and interpreted the patient's 1 view chest x-ray.  I was suspicious of some developing pulmonary edema.  However, the  radiologist did not identify any acute abnormalities.  Defibrillator is present in the left chest.    PROCEDURES:  Critical Care performed: No  Procedures   MEDICATIONS ORDERED IN ED: Medications  ipratropium-albuterol (DUONEB) 0.5-2.5 (3) MG/3ML nebulizer solution 3 mL (has no administration in time range)  ipratropium-albuterol (DUONEB) 0.5-2.5 (3) MG/3ML nebulizer solution 3 mL (has no administration in time range)  ipratropium-albuterol (DUONEB) 0.5-2.5 (3) MG/3ML nebulizer solution 3 mL (has no administration in time range)  furosemide (LASIX) injection 40 mg (has no administration in time range)     IMPRESSION / MDM / ASSESSMENT AND PLAN / ED COURSE  I reviewed the triage vital signs and the nursing notes.                              Differential diagnosis includes, but is not limited to, CHF exacerbation, pneumonia, asthma exacerbation, PE, ACS.  Patient's presentation is most consistent with acute presentation with potential threat to life or bodily function.  Labs/studies ordered: EKG, one-view portable chest x-ray, CBC with differential, CMP, high-sensitivity troponin, BNP.  EKG shows no evidence of ischemia.  CBC is within normal limits.  The rest of the labs are pending.  As documented above, I feel that the chest x-ray is consistent with some pulmonary vascular congestion at a minimum.  However the radiology report indicates no acute abnormalities.  Clinically, however the patient is presenting as a CHF exacerbation, and his history of present illness correlates clinically.  I ordered furosemide 40 mg IV to begin the diuresis process.  I also ordered DuoNebs x 3 in case it will help given that he uses inhalers at home.  He does not need supplemental oxygen at this time.  I am transferring ED care to Dr. Ellender Hose to follow-up on the remainder of his workup and to reassess.  Disposition unclear at this time.  The patient is on the cardiac monitor to evaluate for  evidence of arrhythmia and/or significant heart rate changes.      FINAL CLINICAL IMPRESSION(S) / ED DIAGNOSES   Final diagnoses:  Acute dyspnea  Acute on chronic congestive heart failure, unspecified heart failure type ([place])     Rx / DC Orders   ED Discharge Orders     None        Note:  This document was prepared using Dragon voice recognition software and may include unintentional dictation errors.   Hinda Kehr, MD 05/21/22 [phone]

## 2022-05-21 NOTE — ED Notes (Signed)
Tolerated ambulating for 2 minutes without experiencing shortness of breath. O2 saturations remained 98-100%

## 2022-05-23 ENCOUNTER — Emergency Department
Admission: EM | Admit: 2022-05-23 | Discharge: 2022-05-23 | Disposition: A | Discharge status: Home / Self Care | Payer: BC Managed Care – PPO | Attending: Emergency Medicine | Admitting: Emergency Medicine

## 2022-05-23 ENCOUNTER — Emergency Department: Payer: BC Managed Care – PPO

## 2022-05-23 ENCOUNTER — Other Ambulatory Visit: Payer: Self-pay

## 2022-05-23 ENCOUNTER — Encounter: Payer: Self-pay | Admitting: Emergency Medicine

## 2022-05-23 DIAGNOSIS — I2510 Atherosclerotic heart disease of native coronary artery without angina pectoris: Secondary | ICD-10-CM | POA: Diagnosis not present

## 2022-05-23 DIAGNOSIS — J45901 Unspecified asthma with (acute) exacerbation: Secondary | ICD-10-CM | POA: Diagnosis not present

## 2022-05-23 DIAGNOSIS — I509 Heart failure, unspecified: Secondary | ICD-10-CM | POA: Diagnosis not present

## 2022-05-23 DIAGNOSIS — I110 Hypertensive heart disease with heart failure: Secondary | ICD-10-CM | POA: Diagnosis not present

## 2022-05-23 DIAGNOSIS — E119 Type 2 diabetes mellitus without complications: Secondary | ICD-10-CM | POA: Insufficient documentation

## 2022-05-23 DIAGNOSIS — R2243 Localized swelling, mass and lump, lower limb, bilateral: Secondary | ICD-10-CM | POA: Insufficient documentation

## 2022-05-23 DIAGNOSIS — R059 Cough, unspecified: Secondary | ICD-10-CM | POA: Diagnosis not present

## 2022-05-23 DIAGNOSIS — U071 COVID-19: Secondary | ICD-10-CM | POA: Diagnosis not present

## 2022-05-23 DIAGNOSIS — J45909 Unspecified asthma, uncomplicated: Secondary | ICD-10-CM | POA: Diagnosis not present

## 2022-05-23 DIAGNOSIS — R0602 Shortness of breath: Secondary | ICD-10-CM | POA: Diagnosis not present

## 2022-05-23 LAB — BASIC METABOLIC PANEL
Anion gap: 9 (ref 5–15)
BUN: 14 mg/dL (ref 6–20)
CO2: 25 mmol/L (ref 22–32)
Calcium: 8.5 mg/dL — ABNORMAL LOW (ref 8.9–10.3)
Chloride: 101 mmol/L (ref 98–111)
Creatinine, Ser: 1.12 mg/dL (ref 0.61–1.24)
GFR, Estimated: >60 mL/min (ref >60)
Glucose, Bld: 313 mg/dL — ABNORMAL HIGH (ref 70–99)
Potassium: 3.7 mmol/L (ref 3.5–5.1)
Sodium: 135 mmol/L (ref 135–145)

## 2022-05-23 LAB — CBC
HCT: 41.1 % (ref 39.0–52.0)
Hemoglobin: 13.7 g/dL (ref 13.0–17.0)
MCH: 29.9 pg (ref 26.0–34.0)
MCHC: 33.3 g/dL (ref 30.0–36.0)
MCV: 89.7 fL (ref 80.0–100.0)
Platelets: 182 10*3/uL (ref 150–400)
RBC: 4.58 MIL/uL (ref 4.22–5.81)
RDW: 12.5 % (ref 11.5–15.5)
WBC: 5.1 10*3/uL (ref 4.0–10.5)
nRBC: 0 % (ref 0.0–0.2)

## 2022-05-23 LAB — CBG MONITORING, ED: Glucose-Capillary: 305 mg/dL — ABNORMAL HIGH (ref 70–99)

## 2022-05-23 LAB — SARS CORONAVIRUS 2 BY RT PCR: SARS Coronavirus 2 by RT PCR: POSITIVE — AB

## 2022-05-23 LAB — TROPONIN I (HIGH SENSITIVITY)
Troponin I (High Sensitivity): 18 ng/L — ABNORMAL HIGH (ref <18)
Troponin I (High Sensitivity): 17 ng/L (ref <18)

## 2022-05-23 MED ORDER — PREDNISONE 20 MG PO TABS: 60.0000 mg | ORAL_TABLET | Freq: Every day | ORAL | 0 refills | Status: AC

## 2022-05-23 MED ORDER — PREDNISONE 20 MG PO TABS
60.0000 mg | ORAL_TABLET | Freq: Once | ORAL | Status: AC
  Administered 2022-05-23: 60 mg via ORAL
  Filled 2022-05-23: qty 3

## 2022-05-23 MED ORDER — IPRATROPIUM-ALBUTEROL 0.5-2.5 (3) MG/3ML IN SOLN
3.0000 mL | Freq: Once | RESPIRATORY_TRACT | Status: AC
  Administered 2022-05-23: 3 mL via RESPIRATORY_TRACT
  Filled 2022-05-23: qty 3

## 2022-05-23 MED ORDER — MOLNUPIRAVIR 200 MG PO CAPS: 4.0000 | ORAL_CAPSULE | Freq: Two times a day (BID) | ORAL | 0 refills | Status: AC

## 2022-05-23 MED ORDER — INSULIN ASPART 100 UNIT/ML IJ SOLN
15.0000 [IU] | Freq: Once | INTRAMUSCULAR | Status: AC
  Administered 2022-05-23: 15 [IU] via SUBCUTANEOUS
  Filled 2022-05-23: qty 1

## 2022-05-23 NOTE — ED Provider Notes (Signed)
[facility] Provider Note    Event Date/Time   First MD Initiated Contact with Patient 05/23/22 0802     (approximate)   History   Chief Complaint Cough and Generalized Body Aches   HPI  Joseph [name] is a 51 y.o. male with past medical history of hypertension, hyperlipidemia, diabetes, CAD, CHF, and asthma who presents to the ED complaining of cough and body aches.  Patient reports that he has been dealing with increasing cough for about the past 4 days.  Cough has been productive of clear sputum and he reports feeling intermittently short of breath.  He reports being seen in the ED 2 days ago for this difficulty breathing, at that time was diagnosed with mild CHF exacerbation and improved with diuresis.  He states that since then he has developed some chills and body aches, girlfriend at home has been sick with similar cough.  He has not had any fevers and denies any pain in his chest.  He has noticed some mild swelling in his legs, but states this is typical for him and no worse than usual.  He has not had pain in either leg.  He has been using his inhaler at home with partial relief, continues to take his diuretic medications.     Physical Exam   Triage Vital Signs: ED Triage Vitals  Enc Vitals Group     BP 05/23/22 0610 134/75     Pulse Rate 05/23/22 0610 94     Resp 05/23/22 0610 16     Temp 05/23/22 0610 99.3 F (37.4 C)     Temp Source 05/23/22 0610 Oral     SpO2 05/23/22 0610 93 %     Weight 05/23/22 0618 215 lb (97.5 kg)     Height 05/23/22 0618 '6\' 1"'$  (1.854 m)     Head Circumference --      Peak Flow --      Pain Score 05/23/22 0618 1     Pain Loc --      Pain Edu? --      Excl. in [place]? --     Most recent vital signs: Vitals:   05/23/22 0900 05/23/22 0930  BP: (!) 154/85 (!) 154/79  Pulse: 77 76  Resp: 18   Temp:    SpO2: 98% 96%    Constitutional: Alert and oriented. Eyes: Conjunctivae are normal. Head: Atraumatic. Nose: No  congestion/rhinnorhea. Mouth/Throat: Mucous membranes are moist.  Cardiovascular: Normal rate, regular rhythm. Grossly normal heart sounds.  2+ radial pulses bilaterally. Respiratory: Normal respiratory effort.  No retractions. Lungs with expiratory wheezing bilaterally. Gastrointestinal: Soft and nontender. No distention. Musculoskeletal: No lower extremity tenderness, trace pitting edema bilaterally. Neurologic:  Normal speech and language. No gross focal neurologic deficits are appreciated.    ED Results / Procedures / Treatments   Labs (all labs ordered are listed, but only abnormal results are displayed) Labs Reviewed  SARS CORONAVIRUS 2 BY RT PCR - Abnormal; Notable for the following components:      Result Value   SARS Coronavirus 2 by RT PCR POSITIVE (*)    All other components within normal limits  BASIC METABOLIC PANEL - Abnormal; Notable for the following components:   Glucose, Bld 313 (*)    Calcium 8.5 (*)    All other components within normal limits  TROPONIN I (HIGH SENSITIVITY) - Abnormal; Notable for the following components:   Troponin I (High Sensitivity) 18 (*)    All other  components within normal limits  CBC  TROPONIN I (HIGH SENSITIVITY)     EKG  ED ECG REPORT I, Blake Divine, the attending physician, personally viewed and interpreted this ECG.   Date: 05/23/2022  EKG Time: 6:26  Rate: 93  Rhythm: normal sinus rhythm  Axis: Normal  Intervals:right bundle branch block  ST&T Change: None  RADIOLOGY Chest x-ray reviewed and interpreted by me with no infiltrate, edema, or effusion.  PROCEDURES:  Critical Care performed: No  Procedures   MEDICATIONS ORDERED IN ED: Medications  predniSONE (DELTASONE) tablet 60 mg (60 mg Oral Given 05/23/22 0822)  ipratropium-albuterol (DUONEB) 0.5-2.5 (3) MG/3ML nebulizer solution 3 mL (3 mLs Nebulization Given 05/23/22 0822)  insulin aspart (novoLOG) injection 15 Units (15 Units Subcutaneous Given 05/23/22  8416)     IMPRESSION / MDM / ASSESSMENT AND PLAN / ED COURSE  I reviewed the triage vital signs and the nursing notes.                              51 y.o. male with past medical history of hypertension, hyperlipidemia, diabetes, CAD, CHF, and asthma who presents to the ED complaining of 4 days of cough productive of clear sputum with some mild difficulty breathing, chills, and body aches.  Patient's presentation is most consistent with acute presentation with potential threat to life or bodily function.  Differential diagnosis includes, but is not limited to, ACS, PE, pneumonia, pneumothorax, bronchitis, asthma exacerbation, CHF exacerbation, COVID-19.  Patient nontoxic-appearing and in no acute distress, vital signs are unremarkable.  He is maintaining oxygen saturations on room air with minimal difficulty breathing at this time, lungs do have some mild expiratory wheezing.  With his chills and body aches, symptoms seem consistent with a bronchitis or potentially COVID-19 with mild exacerbation of his asthma.  EKG shows no evidence of arrhythmia or ischemia, initial troponin mildly elevated however he has had similarly mildly elevated troponins in the past.  We will trend troponin, remainder of labs are reassuring with no significant anemia, leukocytosis, electrolyte abnormality, or AKI.  Chest x-ray is unremarkable with no evidence of CHF or pneumonia.  Plan to treat with DuoNeb and steroids, also give insulin for hyperglycemia, reassess.  Repeat troponin is stable, patient reports feeling better following DuoNeb and dose of steroids.  COVID-19 testing did come back positive, which is likely the source of his symptoms as well as mild asthma exacerbation.  He continues to breathe comfortably on room air and is appropriate for outpatient management, will be prescribed a course of prednisone as well as molnupiravir.  Unfortunately, Paxlovid interacts with his digoxin.  He was counseled to follow-up  with his PCP and to return to the ED for new or worsening symptoms, patient agrees with plan.      FINAL CLINICAL IMPRESSION(S) / ED DIAGNOSES   Final diagnoses:  COVID-19  Exacerbation of asthma, unspecified asthma severity, unspecified whether persistent     Rx / DC Orders   ED Discharge Orders          Ordered    predniSONE (DELTASONE) 20 MG tablet  Daily with breakfast        05/23/22 0952    molnupiravir EUA (LAGEVRIO) 200 MG CAPS capsule  2 times daily        05/23/22 6063             Note:  This document was prepared using Dragon voice recognition software and  may include unintentional dictation errors.   Blake Divine, MD 05/23/22 [phone]

## 2022-05-23 NOTE — ED Triage Notes (Addendum)
Pt to ED from home c/o productive cough, clear sputum, chills, body aches for a couple days. Unsure if had fevers but reports hot and cold.  Pt A&Ox4, chest rise even and unlabored, skin WNL.  Pt has significant hx of CHF, defibrillator, MI.

## 2022-06-09 ENCOUNTER — Telehealth (HOSPITAL_COMMUNITY): Payer: Self-pay

## 2022-06-09 NOTE — Telephone Encounter (Signed)
Called and was unable to leave a voice message to confirm/remind patient of their appointment at the [facility] on 06/10/22

## 2022-06-10 ENCOUNTER — Other Ambulatory Visit (HOSPITAL_COMMUNITY): Payer: Self-pay | Admitting: Cardiology

## 2022-06-10 ENCOUNTER — Telehealth (HOSPITAL_COMMUNITY): Payer: Self-pay | Admitting: Surgery

## 2022-06-10 ENCOUNTER — Ambulatory Visit (HOSPITAL_COMMUNITY)
Admission: RE | Admit: 2022-06-10 | Discharge: 2022-06-10 | Disposition: A | Discharge status: Home / Self Care | Payer: BC Managed Care – PPO | Source: Ambulatory Visit | Attending: Cardiology | Admitting: Cardiology

## 2022-06-10 ENCOUNTER — Encounter (HOSPITAL_COMMUNITY): Payer: Self-pay

## 2022-06-10 VITALS — BP 114/70 | HR 84 | Ht 73.0 in | Wt 218.0 lb

## 2022-06-10 DIAGNOSIS — Z7982 Long term (current) use of aspirin: Secondary | ICD-10-CM | POA: Insufficient documentation

## 2022-06-10 DIAGNOSIS — Z794 Long term (current) use of insulin: Secondary | ICD-10-CM | POA: Diagnosis not present

## 2022-06-10 DIAGNOSIS — I252 Old myocardial infarction: Secondary | ICD-10-CM | POA: Insufficient documentation

## 2022-06-10 DIAGNOSIS — G4733 Obstructive sleep apnea (adult) (pediatric): Secondary | ICD-10-CM | POA: Diagnosis not present

## 2022-06-10 DIAGNOSIS — I5022 Chronic systolic (congestive) heart failure: Secondary | ICD-10-CM

## 2022-06-10 DIAGNOSIS — K219 Gastro-esophageal reflux disease without esophagitis: Secondary | ICD-10-CM | POA: Insufficient documentation

## 2022-06-10 DIAGNOSIS — E109 Type 1 diabetes mellitus without complications: Secondary | ICD-10-CM | POA: Diagnosis not present

## 2022-06-10 DIAGNOSIS — Z7951 Long term (current) use of inhaled steroids: Secondary | ICD-10-CM | POA: Diagnosis not present

## 2022-06-10 DIAGNOSIS — I2510 Atherosclerotic heart disease of native coronary artery without angina pectoris: Secondary | ICD-10-CM | POA: Insufficient documentation

## 2022-06-10 DIAGNOSIS — I110 Hypertensive heart disease with heart failure: Secondary | ICD-10-CM | POA: Diagnosis not present

## 2022-06-10 DIAGNOSIS — Z79899 Other long term (current) drug therapy: Secondary | ICD-10-CM | POA: Insufficient documentation

## 2022-06-10 DIAGNOSIS — Z8249 Family history of ischemic heart disease and other diseases of the circulatory system: Secondary | ICD-10-CM | POA: Diagnosis not present

## 2022-06-10 DIAGNOSIS — Z955 Presence of coronary angioplasty implant and graft: Secondary | ICD-10-CM | POA: Diagnosis not present

## 2022-06-10 DIAGNOSIS — E785 Hyperlipidemia, unspecified: Secondary | ICD-10-CM | POA: Diagnosis not present

## 2022-06-10 DIAGNOSIS — I4589 Other specified conduction disorders: Secondary | ICD-10-CM | POA: Insufficient documentation

## 2022-06-10 DIAGNOSIS — I255 Ischemic cardiomyopathy: Secondary | ICD-10-CM | POA: Diagnosis not present

## 2022-06-10 LAB — COMPREHENSIVE METABOLIC PANEL
ALT: 39 U/L (ref 0–44)
AST: 31 U/L (ref 15–41)
Albumin: 3.4 g/dL — ABNORMAL LOW (ref 3.5–5.0)
Alkaline Phosphatase: 116 U/L (ref 38–126)
Anion gap: 7 (ref 5–15)
BUN: 13 mg/dL (ref 6–20)
CO2: 29 mmol/L (ref 22–32)
Calcium: 9.4 mg/dL (ref 8.9–10.3)
Chloride: 104 mmol/L (ref 98–111)
Creatinine, Ser: 1.31 mg/dL — ABNORMAL HIGH (ref 0.61–1.24)
GFR, Estimated: >60 mL/min (ref >60)
Glucose, Bld: 91 mg/dL (ref 70–99)
Potassium: 4.7 mmol/L (ref 3.5–5.1)
Sodium: 140 mmol/L (ref 135–145)
Total Bilirubin: 0.7 mg/dL (ref 0.3–1.2)
Total Protein: 7.4 g/dL (ref 6.5–8.1)

## 2022-06-10 LAB — LIPID PANEL
Cholesterol: 152 mg/dL (ref 0–200)
HDL: 47 mg/dL (ref >40)
LDL Cholesterol: 89 mg/dL (ref 0–99)
Total CHOL/HDL Ratio: 3.2 RATIO
Triglycerides: 82 mg/dL (ref <150)
VLDL: 16 mg/dL (ref 0–40)

## 2022-06-10 LAB — DIGOXIN LEVEL: Digoxin Level: 0.8 ng/mL (ref 0.8–2.0)

## 2022-06-10 LAB — BRAIN NATRIURETIC PEPTIDE: B Natriuretic Peptide: 35.5 pg/mL (ref 0.0–100.0)

## 2022-06-10 MED ORDER — POTASSIUM CHLORIDE CRYS ER 10 MEQ PO TBCR: 20.0000 meq | EXTENDED_RELEASE_TABLET | Freq: Every day | ORAL | 3 refills | Status: DC

## 2022-06-10 MED ORDER — TORSEMIDE 20 MG PO TABS: 40.0000 mg | ORAL_TABLET | Freq: Every day | ORAL | 0 refills | Status: DC

## 2022-06-10 MED ORDER — TORSEMIDE 20 MG PO TABS: 40.0000 mg | ORAL_TABLET | Freq: Every day | ORAL | 1 refills | Status: DC

## 2022-06-10 MED ORDER — POTASSIUM CHLORIDE CRYS ER 10 MEQ PO TBCR: 10.0000 meq | EXTENDED_RELEASE_TABLET | Freq: Every day | ORAL | 3 refills | Status: DC

## 2022-06-10 MED ORDER — TORSEMIDE 20 MG PO TABS: 60.0000 mg | ORAL_TABLET | Freq: Every day | ORAL | 3 refills | Status: DC

## 2022-06-10 NOTE — Progress Notes (Signed)
ReDS Vest / Clip - 06/10/22 1000       ReDS Vest / Clip   Station Marker D    Ruler Value 32    ReDS Value Range Low volume    ReDS Actual Value 31    Anatomical Comments sitting

## 2022-06-10 NOTE — Progress Notes (Signed)
Advanced Heart Failure Clinic Progress Note    PCP: [facility] HF Cardiology: Dr. Aundra Dubin  51 y.o. with history of type 1 DM, HTN, CAD, and chronic systolic CHF presents for followup of CHF.  Patient had anterior STEMI with DES to proximal LAD in 6/11.  Echo at the time with EF < 35%.  Patient eventually had [employer] ICD placed.  LV systolic function has been low since 6/11 MI.  He presented again in 2/18 with chest pain and dyspnea as well as anterior STE on ECG, but cath showed only moderate proximal LAD in-stent restenosis, not explaining his ECG changes.  Most recent echo in 1/23 showed EF 25-30%, normal RV, mild-moderate MR. Patient reports that his father had an MI, but actually after he had his first MI.  He is not a smoker.   CPX 5/23>>Submax test. Moderate to severe functional limitation due to a combination of HF and moderate to severe restrictive lung physiology due to patient's body habitus. Chronotropic incompetence was noted. Suspect lung limitation predominates.   Diagnosed w/ COVID 8/23 but did not require hospitalization. Recovered at home. Most of symptoms resolved.   Presents today for f/u. Reports exertional dyspnea, NYHA class II, 2 pillow orthopnea and bendopnea. + abdominal distention. Reports full med compliance but not restricting salt. Eats out frequently. Denies CP. BP 114/70 today. Reports full med compliance. No VT on device interrogation.   ECG: not performed   Labs (1/23): LDL 80, BNP 50, digoxin 0.5, K 4.1, creatinine 0.94 Labs (8/23): Creatinine 1.12, K 3.7   PMH: 1. Type 1 diabetes: Diagnosis in 1997. H/o DKA.  2. GERD 3. Hyperlipidemia 4. HTN 5. OSA: Uses CPAP 6. H/o seizure 7. CAD: 6/11 anterior MI with PCI to LAD.  - 2/18 STEMI by ECG => cath with moderate in-stent restenosis in the ostial/proximal LAD stent but did not explain ECG changes (?recanalized).   8. Chronic systolic CHF: Ischemic cardiomyopathy. [place].  - Echo (6/11): EF < 35%.  - Echo (2017): EF 40% - Echo (2/18): EF 25-30% - Echo (1/23): EF 25-30%, normal RV, mild-moderate MR.   Social History   Socioeconomic History   Marital status: Divorced    Spouse name: Not on file   Number of children: Not on file   Years of education: Not on file   Highest education level: Not on file  Occupational History   Occupation: Glass blower/designer  Tobacco Use   Smoking status: Never   Smokeless tobacco: Never  Vaping Use   Vaping Use: Never used  Substance and Sexual Activity   Alcohol use: No   Drug use: No   Sexual activity: Yes  Other Topics Concern   Not on file  Social History Narrative   Not on file   Social Determinants of Health   Financial Resource Strain: Not on file  Food Insecurity: Not on file  Transportation Needs: Not on file  Physical Activity: Not on file  Stress: Not on file  Social Connections: Not on file  Intimate Partner Violence: Not on file   Family History  Problem Relation Age of Onset   Hypertension Mother    Heart attack Paternal Grandmother    Heart attack Father    Bladder Cancer Neg Hx    Kidney cancer Neg Hx    Prostate cancer Neg Hx    ROS: all systems reviewed and negative except as per HPI.   Current Outpatient Medications  Medication Sig Dispense Refill  acetaminophen (TYLENOL) 500 MG tablet Take 500 mg by mouth every 6 (six) hours as needed for fever.     albuterol (PROVENTIL HFA) 108 (90 Base) MCG/ACT inhaler Inhale 2 puffs into the lungs every 4 (four) hours as needed for wheezing or shortness of breath. 1 Inhaler 0   aspirin EC 81 MG tablet Take 81 mg by mouth every morning.     atorvastatin (LIPITOR) 80 MG tablet Take 1 tablet (80 mg total) by mouth daily. 90 tablet 3   carvedilol (COREG) 12.5 MG tablet Take 1.5 tablets (18.75 mg total) by mouth 2 (two) times daily with a meal. 140 tablet 6   Cholecalciferol 25 MCG (1000 UT) capsule Take 1,000 Units by mouth daily.     digoxin  (LANOXIN) 0.125 MG tablet Take 0.125 mg by mouth daily.     ENTRESTO 97-103 MG TAKE 1 TABLET BY MOUTH TWICE A DAY. STOP LOSARTAN 60 tablet 5   fluticasone (FLONASE) 50 MCG/ACT nasal spray Place 2 sprays into both nostrils daily as needed for allergies.      furosemide (LASIX) 20 MG tablet Take 20 mg by mouth 2 (two) times daily.     glucagon, human recombinant, (GLUCAGEN) 1 MG injection Inject into the muscle as needed.     insulin aspart (NOVOLOG) 100 UNIT/ML FlexPen Inject 18-20-22 u TID AC plus sliding scale for up to 75 units TDD     insulin degludec (TRESIBA FLEXTOUCH) 200 UNIT/ML FlexTouch Pen Inject 60 Units into the skin daily.     Insulin Pen Needle (B-D ULTRAFINE III SHORT PEN) 31G X 8 MM MISC USE 5 TIMES DAILY AS  DIRECTED 75 each 0   loperamide (IMODIUM) 2 MG capsule PLEASE SEE ATTACHED FOR DETAILED DIRECTIONS     loratadine (CLARITIN) 10 MG tablet Take 10 mg by mouth daily as needed.      naproxen sodium (ALEVE) 220 MG tablet Take 220 mg by mouth daily as needed.     potassium chloride (KLOR-CON) 10 MEQ tablet Take 10 mEq by mouth daily.     spironolactone (ALDACTONE) 25 MG tablet Take 1 tablet (25 mg total) by mouth daily. 90 tablet 3   SYMBICORT 160-4.5 MCG/ACT inhaler Inhale 2 puffs into the lungs 2 (two) times daily.     tadalafil (CIALIS) 20 MG tablet Take 1 tablet (20 mg total) by mouth daily as needed for erectile dysfunction. 30 tablet 11   torsemide (DEMADEX) 20 MG tablet Take 2 tablets (40 mg total) by mouth daily. 60 tablet 0   traMADol (ULTRAM) 50 MG tablet Take 50 mg by mouth every 4 (four) hours as needed.     No current facility-administered medications for this encounter.   BP 114/70   Pulse 84   Ht '6\' 1"'$  (1.854 m)   Wt 98.9 kg (218 lb)   SpO2 96%   BMI 28.76 kg/m  PHYSICAL EXAM: ReDS 31%  General:  Well appearing. No respiratory difficulty HEENT: normal Neck: supple. no JVD. Carotids 2+ bilat; no bruits. No lymphadenopathy or thyromegaly appreciated. Cor:  PMI nondisplaced. Regular rate & rhythm. No rubs, gallops or murmurs. Lungs: clear Abdomen: soft, nontender, +distended. No hepatosplenomegaly. No bruits or masses. Good bowel sounds. Extremities: no cyanosis, clubbing, rash, edema Neuro: alert & oriented x 3, cranial nerves grossly intact. moves all 4 extremities w/o difficulty. Affect pleasant.    Assessment/Plan: 1. CAD: S/p anterior MI in 2011. Denies chest pain  - Continue ASA 81 daily.  - on atorvastatin 80 mg.  Check Lipid Panel today, LDL goal < 55  - continue ? blocker, Coreg 18.75 mg bid   2. Chronic systolic CHF: Ischemic cardiomyopathy with [place].  EF has been low since 2011.  Most recent echo in 1/23 with EF 25-30%, normal RV, mild-moderate MR. CPX 5/23>>Submax test. Moderate to severe functional limitation due to a combination of HF and moderate to severe restrictive lung physiology due to patient's body habitus. Chronotropic incompetence was noted. Suspect lung limitation predominates - NYHA class II symptoms.  ReDs only 31% but has + abdominal edema, bendopnea and recent 2 pillow orthopnea - Increase torsemide to 60 mg daily and KCl to 20 mEQ daily. Check BNP and BMP today  - Continue Coreg 18.75 mg bid - Continue spironolactone 25 mg daily.  - Continue digoxin, check level today.  - Continue Entresto 97/103 bid.   - No SGLT2 inhibitor due to type 1 diabetes.   3. Type 1 diabetes: Per primary provider.   F/u w/ APP in 3-4 weeks to reassess volume status   Lyda Jester, PA-C  06/10/2022

## 2022-06-10 NOTE — Telephone Encounter (Signed)
I called patient to review results and recommendations per provider. I have updated medlist in [facility] and he is scheduled to return for labwork on Tuesday as clinic is closed Monday.

## 2022-06-10 NOTE — Telephone Encounter (Signed)
-----   Message from [facility] [place] sent at 06/10/2022  1:31 PM EDT ----- Fluid marker actually returned normal. Would only increase torsemide to 60 mg x 1 day, then return to 40 mg daily. Keep KCl at 10 mEq daily.   Repeat Dig level on Monday.

## 2022-06-10 NOTE — Patient Instructions (Signed)
Medication Changes:  Increase: Torsemide take 3 tablets by mouth daily. Increase Potassium take 2 tablets daily.   Lab Work:  Labs today We will only contact you if something comes back abnormal or we need to make some changes. Otherwise no news is good news!    Follow-Up in: 2-3 weeks with a Nurse /Practitioner or Librarian, academic.  At the [facility], you and your health needs are our priority. We have a designated team specialized in the treatment of Heart Failure. This Care Team includes your primary Heart Failure Specialized Cardiologist (physician), Advanced Practice Providers (APPs- Physician Assistants and Nurse Practitioners), and Pharmacist who all work together to provide you with the care you need, when you need it.   You may see any of the following providers on your designated Care Team at your next follow up:  Dr Glori Bickers Dr Haynes Kerns, NP Lyda Jester, [place] [facility] [place], [place] Audry Riles, PharmD   Please be sure to bring in all your medications bottles to every appointment.   Need to Contact us:  If you have any questions or concerns before your next appointment please send [place] a message through [place] or call our office at [phone].    TO LEAVE A MESSAGE FOR THE NURSE SELECT OPTION 2, PLEASE LEAVE A MESSAGE INCLUDING: YOUR NAME DATE OF BIRTH CALL BACK NUMBER REASON FOR CALL**this is important as we prioritize the call backs  YOU WILL RECEIVE A CALL BACK THE SAME DAY AS LONG AS YOU CALL BEFORE 4:00 PM

## 2022-06-13 ENCOUNTER — Other Ambulatory Visit (HOSPITAL_COMMUNITY): Payer: Self-pay | Admitting: Cardiology

## 2022-06-15 ENCOUNTER — Other Ambulatory Visit (HOSPITAL_COMMUNITY): Payer: BC Managed Care – PPO

## 2022-06-18 ENCOUNTER — Encounter: Payer: Self-pay | Admitting: Internal Medicine

## 2022-06-30 ENCOUNTER — Telehealth (HOSPITAL_COMMUNITY): Payer: Self-pay

## 2022-06-30 NOTE — Telephone Encounter (Signed)
Called to confirm/remind patient of their appointment at the [facility] on 07/01/22.   Patient reminded to bring all medications and/or complete list.  Confirmed patient has transportation. Gave directions, instructed to utilize [address] parking.  Confirmed appointment prior to ending call.

## 2022-07-01 ENCOUNTER — Encounter (HOSPITAL_COMMUNITY): Payer: Self-pay

## 2022-07-01 ENCOUNTER — Ambulatory Visit (HOSPITAL_COMMUNITY)
Admission: RE | Admit: 2022-07-01 | Discharge: 2022-07-01 | Disposition: A | Discharge status: Home / Self Care | Payer: BC Managed Care – PPO | Source: Ambulatory Visit | Attending: Physician Assistant | Admitting: Physician Assistant

## 2022-07-01 VITALS — BP 150/74 | HR 90 | Wt 219.0 lb

## 2022-07-01 DIAGNOSIS — E109 Type 1 diabetes mellitus without complications: Secondary | ICD-10-CM | POA: Diagnosis not present

## 2022-07-01 DIAGNOSIS — I110 Hypertensive heart disease with heart failure: Secondary | ICD-10-CM | POA: Insufficient documentation

## 2022-07-01 DIAGNOSIS — I4589 Other specified conduction disorders: Secondary | ICD-10-CM | POA: Diagnosis not present

## 2022-07-01 DIAGNOSIS — I255 Ischemic cardiomyopathy: Secondary | ICD-10-CM | POA: Insufficient documentation

## 2022-07-01 DIAGNOSIS — I2510 Atherosclerotic heart disease of native coronary artery without angina pectoris: Secondary | ICD-10-CM | POA: Diagnosis not present

## 2022-07-01 DIAGNOSIS — I5022 Chronic systolic (congestive) heart failure: Secondary | ICD-10-CM | POA: Diagnosis not present

## 2022-07-01 DIAGNOSIS — J45909 Unspecified asthma, uncomplicated: Secondary | ICD-10-CM | POA: Insufficient documentation

## 2022-07-01 DIAGNOSIS — Z79899 Other long term (current) drug therapy: Secondary | ICD-10-CM | POA: Diagnosis not present

## 2022-07-01 DIAGNOSIS — I252 Old myocardial infarction: Secondary | ICD-10-CM | POA: Insufficient documentation

## 2022-07-01 DIAGNOSIS — J4550 Severe persistent asthma, uncomplicated: Secondary | ICD-10-CM | POA: Diagnosis not present

## 2022-07-01 DIAGNOSIS — G4733 Obstructive sleep apnea (adult) (pediatric): Secondary | ICD-10-CM | POA: Insufficient documentation

## 2022-07-01 DIAGNOSIS — Z794 Long term (current) use of insulin: Secondary | ICD-10-CM | POA: Insufficient documentation

## 2022-07-01 LAB — COMPREHENSIVE METABOLIC PANEL
ALT: 28 U/L (ref 0–44)
AST: 25 U/L (ref 15–41)
Albumin: 3.7 g/dL (ref 3.5–5.0)
Alkaline Phosphatase: 115 U/L (ref 38–126)
Anion gap: 3 — ABNORMAL LOW (ref 5–15)
BUN: 31 mg/dL — ABNORMAL HIGH (ref 6–20)
CO2: 24 mmol/L (ref 22–32)
Calcium: 8.6 mg/dL — ABNORMAL LOW (ref 8.9–10.3)
Chloride: 108 mmol/L (ref 98–111)
Creatinine, Ser: 1.4 mg/dL — ABNORMAL HIGH (ref 0.61–1.24)
GFR, Estimated: >60 mL/min (ref >60)
Glucose, Bld: 45 mg/dL — ABNORMAL LOW (ref 70–99)
Potassium: 4.2 mmol/L (ref 3.5–5.1)
Sodium: 135 mmol/L (ref 135–145)
Total Bilirubin: 0.9 mg/dL (ref 0.3–1.2)
Total Protein: 7.5 g/dL (ref 6.5–8.1)

## 2022-07-01 LAB — DIGOXIN LEVEL: Digoxin Level: 0.7 ng/mL — ABNORMAL LOW (ref 0.8–2.0)

## 2022-07-01 LAB — BRAIN NATRIURETIC PEPTIDE: B Natriuretic Peptide: 28.6 pg/mL (ref 0.0–100.0)

## 2022-07-01 MED ORDER — BISOPROLOL FUMARATE 10 MG PO TABS: 10.0000 mg | ORAL_TABLET | Freq: Every day | ORAL | 3 refills | Status: DC

## 2022-07-01 NOTE — Progress Notes (Addendum)
Advanced [facility] Failure Clinic Progress Note    PCP: Establishing with [facility] in [place] HF Cardiology: Dr. Aundra Dubin  51 y.o. with history of type 1 DM, HTN, CAD, chronic systolic CHF, severe asthma, OSA not adherent with CPAP.  Patient had anterior STEMI with DES to proximal LAD in 6/11.  Echo at the time with EF < 35%.  Patient eventually had [employer] ICD placed.  LV systolic function has been low since 6/11 MI.  He presented again in 2/18 with chest pain and dyspnea as well as anterior STE on ECG, but cath showed only moderate proximal LAD in-stent restenosis, not explaining his ECG changes.  Most recent echo in 1/23 showed EF 25-30%, normal RV, mild-moderate MR.   CPX 5/23>>Submax test. Moderate to severe functional limitation due to a combination of HF and moderate to severe restrictive lung physiology due to patient's body habitus. Chronotropic incompetence was noted. Suspect lung limitation predominates.   Diagnosed w/ COVID 8/23 but did not require hospitalization. Recovered at home. Most of symptoms resolved.   Seen for f/u 08/31. Appeared volume up. Not restricting salt intake. Torsemide increased from 40 mg to 60 mg daily. ReDS 31%.  BNP later normal and later instructed to only increase torsemide for 1 day.  He is here today for f/u. Reports some shortness of breath with exertion such as lifting heavy boxes at work. Some days are better than others. No lower extremity edema. Reports frequent cough especially at night dating back a few years, symptoms worse in recent months. Weight has been stable.   Has not been following with pulmonary for his asthma or a provider for his CPAP. Has not used CPAP in over a year.   Patient reports that his father had an MI, but actually after he had his first MI.  He is not a smoker.   PMH: 1. Type 1 diabetes: Diagnosis in 1997. H/o DKA.  2. GERD 3. Hyperlipidemia 4. HTN 5. OSA: Not adherent with CPAP 6. H/o seizure 7. CAD:  6/11 anterior MI with PCI to LAD.  - 2/18 STEMI by ECG => cath with moderate in-stent restenosis in the ostial/proximal LAD stent but did not explain ECG changes (?recanalized).   8. Chronic systolic CHF: Ischemic cardiomyopathy. [place].  - Echo (6/11): EF < 35%.  - Echo (2017): EF 40% - Echo (2/18): EF 25-30% - Echo (1/23): EF 25-30%, normal RV, mild-moderate MR.  9. Severe asthma - Previously seen by Dr. Lanney Gins in 2021  PFTs 01/2020: SPIROMETRY: FVC was 2.93 liters, 67% of predicted FEV1 was 1.92, 55% of predicted FEV1 ratio was 65 FEF 25-75% liters per second was 20% of predicted  LUNG VOLUMES: TLC was 57% of predicted RV was 59% of predicted  DIFFUSION CAPACITY: DLCO was 61% of predicted DLCO/VA was 113% of predicted  FLOW VOLUME LOOP: Scooping of expiratory limb suggestive of obstructive physiology  Impression Moderately severe obst vent defect with decreased DLCO likely due to uncotnrolled asthma.    Social History   Socioeconomic History   Marital status: Divorced    Spouse name: Not on file   Number of children: Not on file   Years of education: Not on file   Highest education level: Not on file  Occupational History   Occupation: Glass blower/designer  Tobacco Use   Smoking status: Never   Smokeless tobacco: Never  Vaping Use   Vaping Use: Never used  Substance and Sexual Activity   Alcohol use: No  Drug use: No   Sexual activity: Yes  Other Topics Concern   Not on file  Social History Narrative   Not on file   Social Determinants of Health   Financial Resource Strain: Not on file  Food Insecurity: Not on file  Transportation Needs: Not on file  Physical Activity: Not on file  Stress: Not on file  Social Connections: Not on file  Intimate Partner Violence: Not on file   Family History  Problem Relation Age of Onset   Hypertension Mother    Heart attack Paternal Grandmother    Heart attack Father    Bladder Cancer Neg Hx     Kidney cancer Neg Hx    Prostate cancer Neg Hx    ROS: all systems reviewed and negative except as per HPI.   Current Outpatient Medications  Medication Sig Dispense Refill   acetaminophen (TYLENOL) 500 MG tablet Take 500 mg by mouth every 6 (six) hours as needed for fever.     albuterol (PROVENTIL HFA) 108 (90 Base) MCG/ACT inhaler Inhale 2 puffs into the lungs every 4 (four) hours as needed for wheezing or shortness of breath. 1 Inhaler 0   aspirin EC 81 MG tablet Take 81 mg by mouth every morning.     atorvastatin (LIPITOR) 80 MG tablet Take 1 tablet (80 mg total) by mouth daily. 90 tablet 3   bisoprolol (ZEBETA) 10 MG tablet Take 1 tablet (10 mg total) by mouth daily. 90 tablet 3   Cholecalciferol 25 MCG (1000 UT) capsule Take 1,000 Units by mouth daily.     digoxin (LANOXIN) 0.125 MG tablet Take 0.125 mg by mouth daily.     ENTRESTO 97-103 MG TAKE 1 TABLET BY MOUTH TWICE A DAY. STOP LOSARTAN 60 tablet 5   fluticasone (FLONASE) 50 MCG/ACT nasal spray Place 2 sprays into both nostrils daily as needed for allergies.      glucagon, human recombinant, (GLUCAGEN) 1 MG injection Inject into the muscle as needed.     insulin aspart (NOVOLOG) 100 UNIT/ML FlexPen Inject 18-20-22 u TID AC plus sliding scale for up to 75 units TDD     insulin degludec (TRESIBA FLEXTOUCH) 200 UNIT/ML FlexTouch Pen Inject 60 Units into the skin daily.     Insulin Pen Needle (B-D ULTRAFINE III SHORT PEN) 31G X 8 MM MISC USE 5 TIMES DAILY AS  DIRECTED 75 each 0   loperamide (IMODIUM) 2 MG capsule PLEASE SEE ATTACHED FOR DETAILED DIRECTIONS     loratadine (CLARITIN) 10 MG tablet Take 10 mg by mouth daily as needed.      naproxen sodium (ALEVE) 220 MG tablet Take 220 mg by mouth daily as needed.     potassium chloride (KLOR-CON M10) 10 MEQ tablet TAKE 2 TABLETS BY MOUTH DAILY 180 tablet 3   spironolactone (ALDACTONE) 25 MG tablet Take 1 tablet (25 mg total) by mouth daily. 90 tablet 3   SYMBICORT 160-4.5 MCG/ACT inhaler  Inhale 2 puffs into the lungs 2 (two) times daily.     torsemide (DEMADEX) 20 MG tablet Take 2 tablets (40 mg total) by mouth daily. 270 tablet 1   traMADol (ULTRAM) 50 MG tablet Take 50 mg by mouth every 4 (four) hours as needed.     tadalafil (CIALIS) 20 MG tablet Take 1 tablet (20 mg total) by mouth daily as needed for erectile dysfunction. (Patient not taking: Reported on 07/01/2022) 30 tablet 11   No current facility-administered medications for this encounter.   BP Marland Kitchen)  150/74   Pulse 90   Wt 99.3 kg (219 lb)   SpO2 97%   BMI 28.89 kg/m  PHYSICAL EXAM: General:  Well appearing. Ambulated into clinic. HEENT: normal Neck: supple. no JVD. Carotids 2+ bilat; no bruits.  Cor: PMI nondisplaced. Regular rate & rhythm. No rubs, gallops or murmurs. Lungs: clear Abdomen: soft, nontender, nondistended.  Extremities: no cyanosis, clubbing, rash, edema Neuro: alert & orientedx3, cranial nerves grossly intact. moves all 4 extremities w/o difficulty. Affect pleasant  ReDS 35%   Assessment/Plan: 1. CAD: S/p anterior MI in 2011. Denies chest pain  - Continue ASA 81 daily.  - on atorvastatin 80 mg. - switch coreg to bisoprolol (see below) 2. Chronic systolic CHF: Ischemic cardiomyopathy with [place].  EF has been low since 2011.  Most recent echo in 1/23 with EF 25-30%, normal RV, mild-moderate MR. CPX 5/23>>Submax test. Moderate to severe functional limitation due to a combination of HF and moderate to severe restrictive lung physiology due to patient's body habitus. Chronotropic incompetence was noted. Suspect lung limitation predominates - NYHA II.  Volume looks good on exam. ReDs 35% - Continue torsemide 40 mg daily - In view of asthma, will switch coreg to bisoprolol 10 mg daily - Continue spironolactone 25 mg daily.  - Continue digoxin, check level today.  - Continue Entresto 97/103 bid.   - No SGLT2 inhibitor due to type 1 diabetes.   - Consider hydralazine/imdur if BP  elevated after above changes to beta blocker. Would need to ensure not taking Tadalafil.  - Instructed him to monitor BP at home 3. Type 1 diabetes: Following with Endocrine - Has not been well-controlled 4. Severe asthma: - Previously saw Pulmonary at [facility] had persistent cough for several years despite optimizing volume. ? How much asthma contributing. Changed coreg to bisoprolol as above - Referral submitted to Pulmonary 5. OSA: - Not using CPAP.  - Has not followed with a provider. Reports trouble with mask. - Referred to Pulmonary  F/u 3 months with Dr. Aundra Dubin  [facility], Lynder Parents, PA-C  07/01/2022

## 2022-07-01 NOTE — Patient Instructions (Signed)
STOP Carvedilol  START Bisoprolol 10 mg daily.  Labs done today, your results will be available in MyChart, we will contact you for abnormal readings.  You have been referred to the pulmonologist for your COPD/ Asthma. Their office will call you to arrange the appointment.  Your physician recommends that you schedule a follow-up appointment in: 3 months.  If you have any questions or concerns before your next appointment please send [place] a message through [place] or call our office at [phone].    TO LEAVE A MESSAGE FOR THE NURSE SELECT OPTION 2, PLEASE LEAVE A MESSAGE INCLUDING: YOUR NAME DATE OF BIRTH CALL BACK NUMBER REASON FOR CALL**this is important as we prioritize the call backs  YOU WILL RECEIVE A CALL BACK THE SAME DAY AS LONG AS YOU CALL BEFORE 4:00 PM  At the [facility], you and your health needs are our priority. As part of our continuing mission to provide you with exceptional heart care, we have created designated Provider Care Teams. These Care Teams include your primary Cardiologist (physician) and Advanced Practice Providers (APPs- Physician Assistants and Nurse Practitioners) who all work together to provide you with the care you need, when you need it.   You may see any of the following providers on your designated Care Team at your next follow up: Dr Glori Bickers Dr Loralie Champagne Dr. Roxana Hires, NP Lyda Jester, [place] [facility] [place], [place] Forestine Na, NP Audry Riles, PharmD   Please be sure to bring in all your medications bottles to every appointment.

## 2022-07-01 NOTE — Progress Notes (Signed)
ReDS Vest / Clip - 07/01/22 1600       ReDS Vest / Clip   Station Marker C    Ruler Value 30    ReDS Value Range Low volume    ReDS Actual Value 35

## 2022-07-07 ENCOUNTER — Encounter (HOSPITAL_COMMUNITY): Payer: Self-pay

## 2022-07-08 IMAGING — CR DG CHEST 2V
1 series · 2 of 2 positions shown · non-contrast
Comparison: Chest radiographs 09/19/2020 and earlier.

CLINICAL DATA: 50-year-old male with shortness of breath, unable to
lie flat. Some lower extremity swelling.

EXAM:
CHEST - 2 VIEW

[Series 1: dg chest 2 view · 0.14mm/px · 2 of 2 slices shown]
[im 1/2]
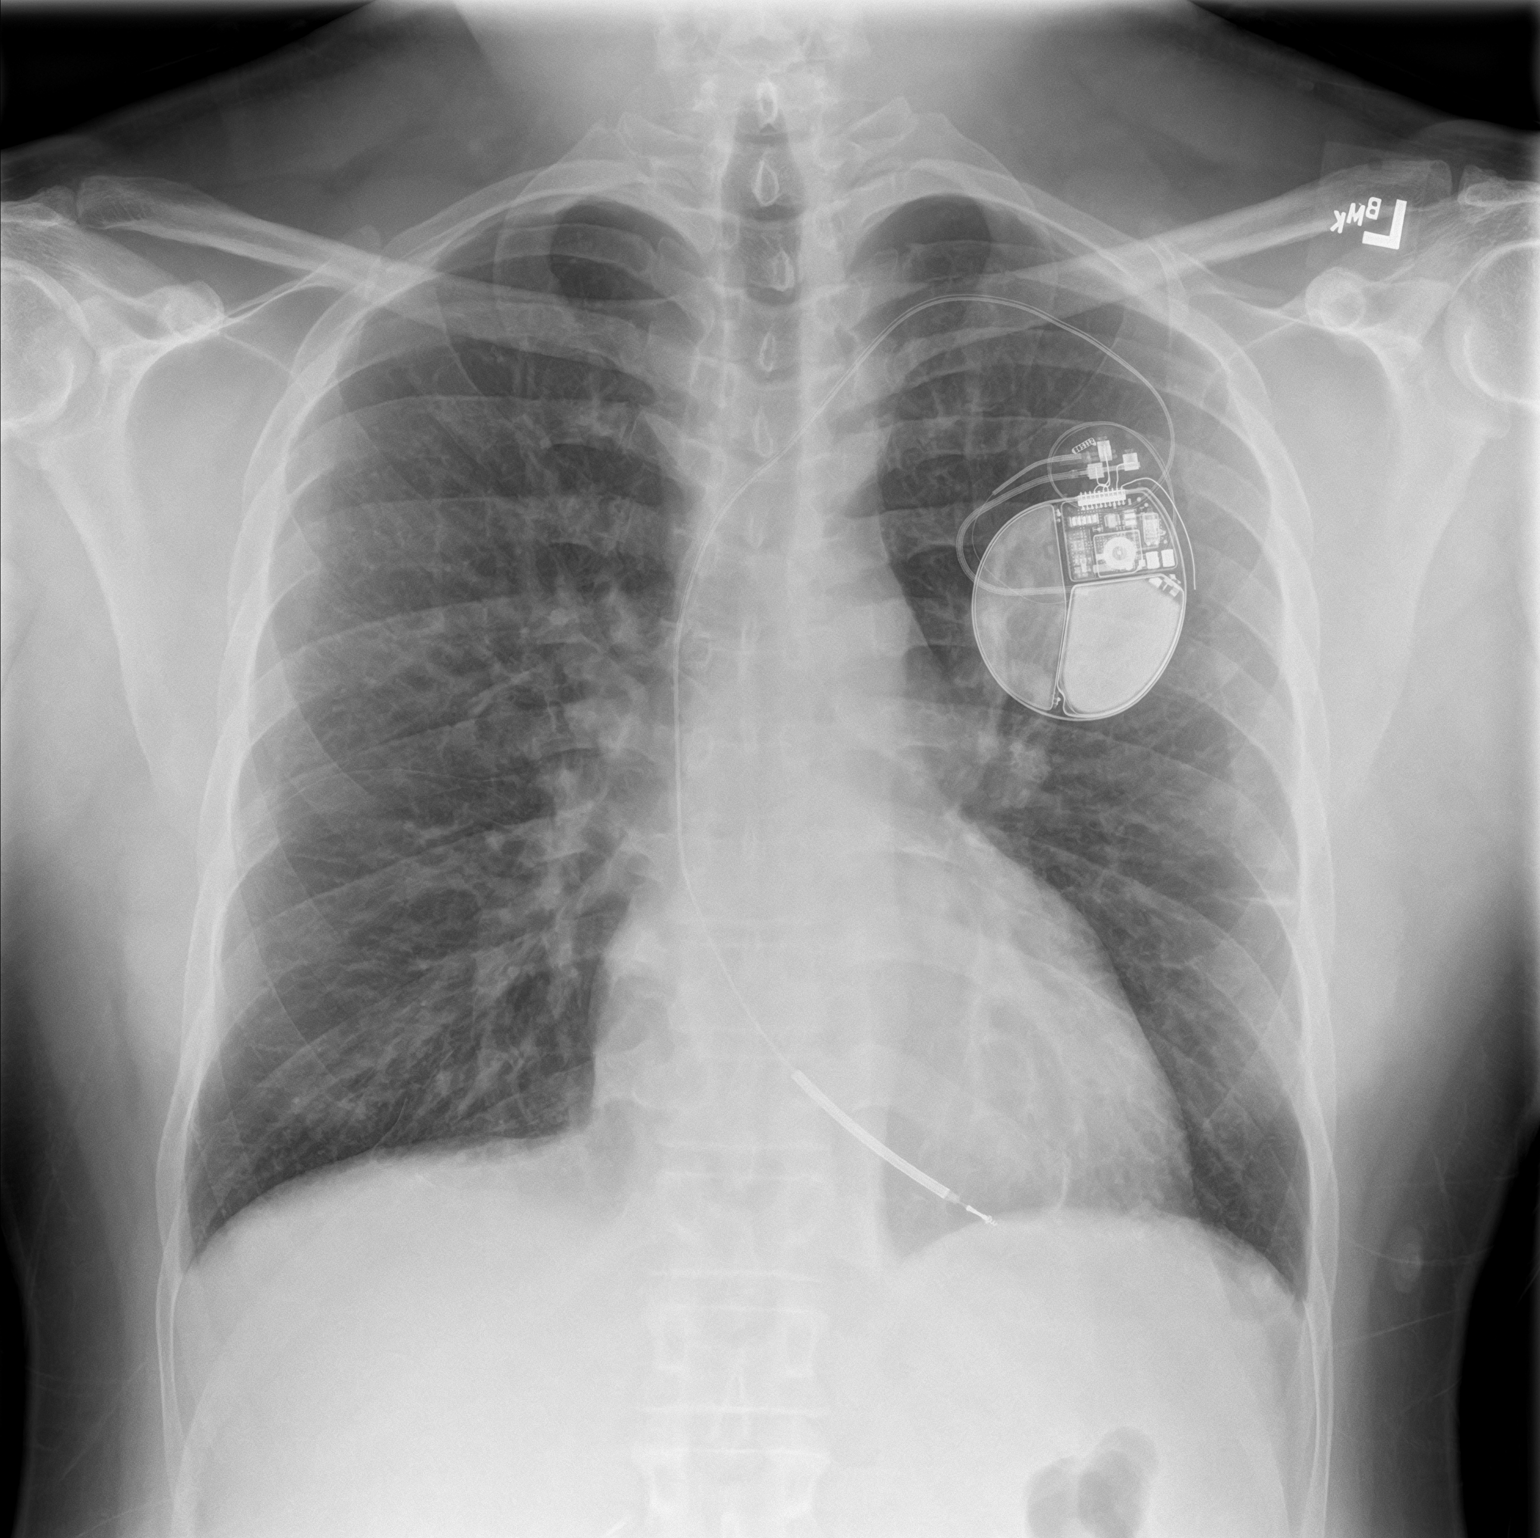
[im 2/2]
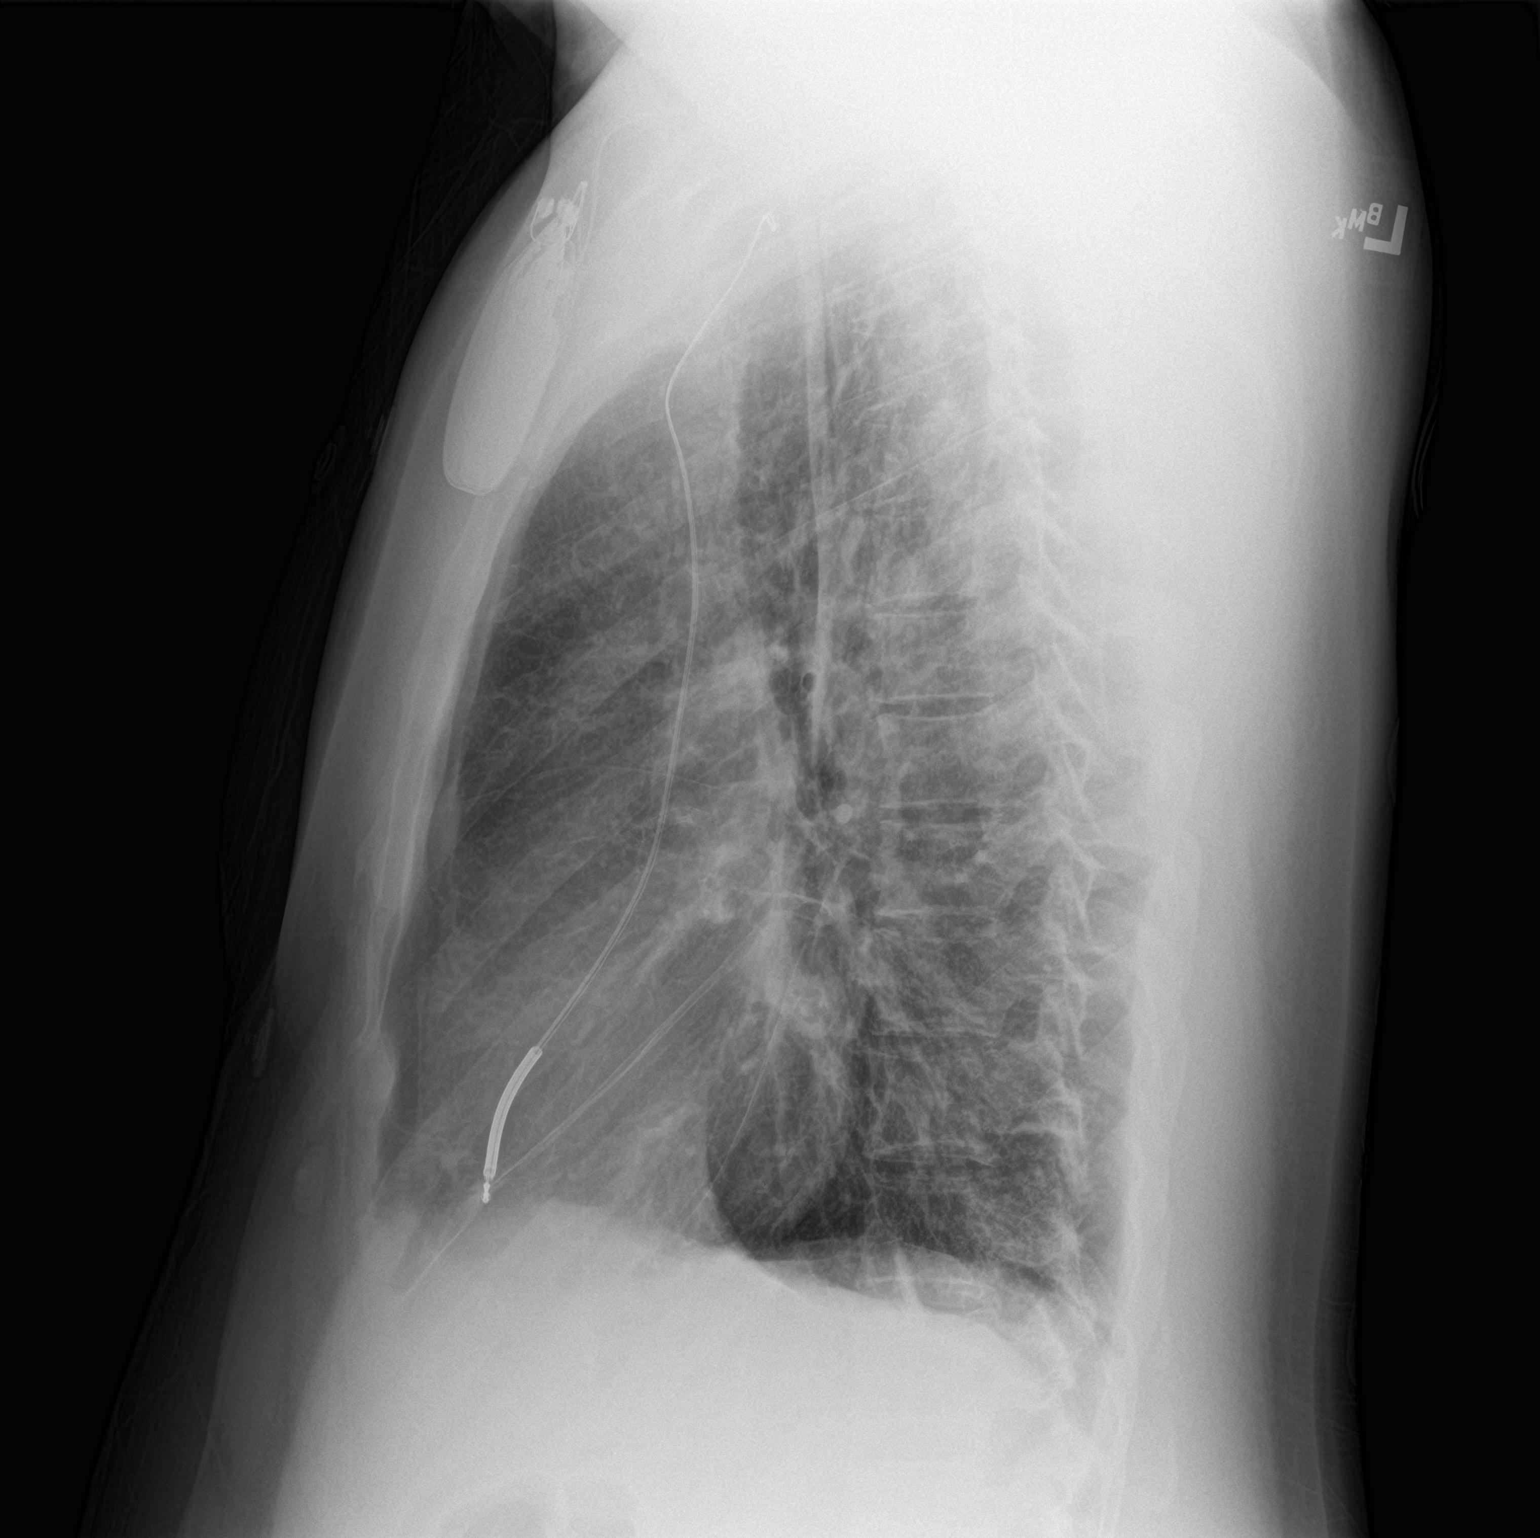

[2 of 2 positions shown; findings below may reference images not displayed]

FINDINGS: Stable left chest AICD. Lung volumes and mediastinal contours are
stable and within normal limits. Visualized tracheal air column is
within normal limits. No pneumothorax. No pleural effusion or
confluent pulmonary opacity. Pulmonary vascularity appears mildly
increased since 0306. But no alveolar edema identified at this time.
No acute osseous abnormality identified. Negative visible bowel gas
pattern.
IMPRESSION: Mildly increased pulmonary vascularity since last year might
indicate mild or developing interstitial edema. No other acute
cardiopulmonary abnormality.

## 2022-07-13 ENCOUNTER — Encounter (HOSPITAL_COMMUNITY): Payer: BC Managed Care – PPO | Admitting: Cardiology

## 2022-07-26 DIAGNOSIS — I255 Ischemic cardiomyopathy: Secondary | ICD-10-CM | POA: Diagnosis not present

## 2022-07-26 DIAGNOSIS — I2101 ST elevation (STEMI) myocardial infarction involving left main coronary artery: Secondary | ICD-10-CM | POA: Diagnosis not present

## 2022-07-26 DIAGNOSIS — Z95810 Presence of automatic (implantable) cardiac defibrillator: Secondary | ICD-10-CM | POA: Diagnosis not present

## 2022-07-26 DIAGNOSIS — I2510 Atherosclerotic heart disease of native coronary artery without angina pectoris: Secondary | ICD-10-CM | POA: Diagnosis not present

## 2022-08-11 DIAGNOSIS — E1169 Type 2 diabetes mellitus with other specified complication: Secondary | ICD-10-CM | POA: Diagnosis not present

## 2022-08-11 DIAGNOSIS — N529 Male erectile dysfunction, unspecified: Secondary | ICD-10-CM | POA: Diagnosis not present

## 2022-08-11 DIAGNOSIS — R300 Dysuria: Secondary | ICD-10-CM | POA: Diagnosis not present

## 2022-08-11 DIAGNOSIS — N5231 Erectile dysfunction following radical prostatectomy: Secondary | ICD-10-CM | POA: Diagnosis not present

## 2022-08-11 DIAGNOSIS — N521 Erectile dysfunction due to diseases classified elsewhere: Secondary | ICD-10-CM | POA: Diagnosis not present

## 2022-08-18 DIAGNOSIS — Z1389 Encounter for screening for other disorder: Secondary | ICD-10-CM | POA: Diagnosis not present

## 2022-08-18 DIAGNOSIS — E109 Type 1 diabetes mellitus without complications: Secondary | ICD-10-CM | POA: Diagnosis not present

## 2022-08-18 DIAGNOSIS — Z0130 Encounter for examination of blood pressure without abnormal findings: Secondary | ICD-10-CM | POA: Diagnosis not present

## 2022-08-18 DIAGNOSIS — I509 Heart failure, unspecified: Secondary | ICD-10-CM | POA: Diagnosis not present

## 2022-08-18 DIAGNOSIS — D649 Anemia, unspecified: Secondary | ICD-10-CM | POA: Diagnosis not present

## 2022-08-18 DIAGNOSIS — Z712 Person consulting for explanation of examination or test findings: Secondary | ICD-10-CM | POA: Diagnosis not present

## 2022-08-18 DIAGNOSIS — I2510 Atherosclerotic heart disease of native coronary artery without angina pectoris: Secondary | ICD-10-CM | POA: Diagnosis not present

## 2022-08-18 DIAGNOSIS — I10 Essential (primary) hypertension: Secondary | ICD-10-CM | POA: Diagnosis not present

## 2022-08-18 DIAGNOSIS — E785 Hyperlipidemia, unspecified: Secondary | ICD-10-CM | POA: Diagnosis not present

## 2022-08-24 ENCOUNTER — Other Ambulatory Visit (HOSPITAL_COMMUNITY): Payer: Self-pay | Admitting: *Deleted

## 2022-08-24 MED ORDER — DIGOXIN 125 MCG PO TABS: 0.1250 mg | ORAL_TABLET | Freq: Every day | ORAL | 6 refills | Status: DC

## 2022-08-24 MED ORDER — ENTRESTO 97-103 MG PO TABS: ORAL_TABLET | ORAL | 6 refills | Status: DC

## 2022-08-25 ENCOUNTER — Encounter: Payer: Self-pay | Admitting: Family Medicine

## 2022-09-20 ENCOUNTER — Telehealth (HOSPITAL_COMMUNITY): Payer: Self-pay

## 2022-09-20 NOTE — Telephone Encounter (Signed)
Received a medical clearance form from [facility] , requesting Cardiology clearance for the following procedure: genitourinary surgery. Medical clearance form was signed by Dr .Aundra Dubin  and successfully faxed to [phone] on Monday, December 11,. Form will be scanned into patients chart.

## 2022-09-24 ENCOUNTER — Emergency Department: Payer: BC Managed Care – PPO

## 2022-09-24 ENCOUNTER — Other Ambulatory Visit: Payer: Self-pay

## 2022-09-24 ENCOUNTER — Emergency Department
Admission: EM | Admit: 2022-09-24 | Discharge: 2022-09-24 | Disposition: A | Discharge status: Home / Self Care | Payer: BC Managed Care – PPO | Attending: Emergency Medicine | Admitting: Emergency Medicine

## 2022-09-24 DIAGNOSIS — Z1152 Encounter for screening for COVID-19: Secondary | ICD-10-CM | POA: Insufficient documentation

## 2022-09-24 DIAGNOSIS — R1012 Left upper quadrant pain: Secondary | ICD-10-CM | POA: Diagnosis not present

## 2022-09-24 DIAGNOSIS — E875 Hyperkalemia: Secondary | ICD-10-CM | POA: Diagnosis not present

## 2022-09-24 DIAGNOSIS — I110 Hypertensive heart disease with heart failure: Secondary | ICD-10-CM | POA: Insufficient documentation

## 2022-09-24 DIAGNOSIS — J45909 Unspecified asthma, uncomplicated: Secondary | ICD-10-CM | POA: Diagnosis not present

## 2022-09-24 DIAGNOSIS — R1013 Epigastric pain: Secondary | ICD-10-CM | POA: Diagnosis not present

## 2022-09-24 DIAGNOSIS — E1165 Type 2 diabetes mellitus with hyperglycemia: Secondary | ICD-10-CM | POA: Diagnosis not present

## 2022-09-24 DIAGNOSIS — R059 Cough, unspecified: Secondary | ICD-10-CM | POA: Diagnosis not present

## 2022-09-24 DIAGNOSIS — I509 Heart failure, unspecified: Secondary | ICD-10-CM | POA: Diagnosis not present

## 2022-09-24 LAB — URINALYSIS, ROUTINE W REFLEX MICROSCOPIC
Bilirubin Urine: NEGATIVE
Glucose, UA: >=500 mg/dL — AB
Hgb urine dipstick: NEGATIVE
Ketones, ur: 5 mg/dL — AB
Leukocytes,Ua: NEGATIVE
Nitrite: NEGATIVE
Protein, ur: NEGATIVE mg/dL
Specific Gravity, Urine: 1.024 (ref 1.005–1.030)
Squamous Epithelial / HPF: NONE SEEN (ref 0–5)
pH: 5 (ref 5.0–8.0)

## 2022-09-24 LAB — CBC
HCT: 45.7 % (ref 39.0–52.0)
Hemoglobin: 15.1 g/dL (ref 13.0–17.0)
MCH: 30.3 pg (ref 26.0–34.0)
MCHC: 33 g/dL (ref 30.0–36.0)
MCV: 91.8 fL (ref 80.0–100.0)
Platelets: 211 10*3/uL (ref 150–400)
RBC: 4.98 MIL/uL (ref 4.22–5.81)
RDW: 12.5 % (ref 11.5–15.5)
WBC: 5.6 10*3/uL (ref 4.0–10.5)
nRBC: 0 % (ref 0.0–0.2)

## 2022-09-24 LAB — COMPREHENSIVE METABOLIC PANEL
ALT: 58 U/L — ABNORMAL HIGH (ref 0–44)
AST: 40 U/L (ref 15–41)
Albumin: 3.9 g/dL (ref 3.5–5.0)
Alkaline Phosphatase: 123 U/L (ref 38–126)
Anion gap: 12 (ref 5–15)
BUN: 41 mg/dL — ABNORMAL HIGH (ref 6–20)
CO2: 19 mmol/L — ABNORMAL LOW (ref 22–32)
Calcium: 8.6 mg/dL — ABNORMAL LOW (ref 8.9–10.3)
Chloride: 100 mmol/L (ref 98–111)
Creatinine, Ser: 1.48 mg/dL — ABNORMAL HIGH (ref 0.61–1.24)
GFR, Estimated: 57 mL/min — ABNORMAL LOW (ref >60)
Glucose, Bld: 551 mg/dL (ref 70–99)
Potassium: 5.4 mmol/L — ABNORMAL HIGH (ref 3.5–5.1)
Sodium: 131 mmol/L — ABNORMAL LOW (ref 135–145)
Total Bilirubin: 1.4 mg/dL — ABNORMAL HIGH (ref 0.3–1.2)
Total Protein: 7.9 g/dL (ref 6.5–8.1)

## 2022-09-24 LAB — RESP PANEL BY RT-PCR (RSV, FLU A&B, COVID)  RVPGX2
Influenza A by PCR: NEGATIVE
Influenza B by PCR: NEGATIVE
Resp Syncytial Virus by PCR: NEGATIVE
SARS Coronavirus 2 by RT PCR: NEGATIVE

## 2022-09-24 LAB — CBG MONITORING, ED
Glucose-Capillary: 441 mg/dL — ABNORMAL HIGH (ref 70–99)
Glucose-Capillary: 371 mg/dL — ABNORMAL HIGH (ref 70–99)

## 2022-09-24 LAB — LIPASE, BLOOD: Lipase: 28 U/L (ref 11–51)

## 2022-09-24 LAB — TROPONIN I (HIGH SENSITIVITY): Troponin I (High Sensitivity): 13 ng/L (ref <18)

## 2022-09-24 MED ORDER — SODIUM CHLORIDE 0.9 % IV BOLUS
1000.0000 mL | Freq: Once | INTRAVENOUS | Status: AC
  Administered 2022-09-24: 1000 mL via INTRAVENOUS

## 2022-09-24 MED ORDER — FAMOTIDINE 20 MG PO TABS: 20.0000 mg | ORAL_TABLET | Freq: Two times a day (BID) | ORAL | 1 refills | Status: DC

## 2022-09-24 MED ORDER — INSULIN ASPART 100 UNIT/ML IJ SOLN
15.0000 [IU] | Freq: Once | INTRAMUSCULAR | Status: DC
  Filled 2022-09-24: qty 1

## 2022-09-24 MED ORDER — INSULIN ASPART 100 UNIT/ML IJ SOLN
10.0000 [IU] | Freq: Once | INTRAMUSCULAR | Status: AC
  Administered 2022-09-24: 10 [IU] via SUBCUTANEOUS
  Filled 2022-09-24: qty 1

## 2022-09-24 MED ORDER — ALUM & MAG HYDROXIDE-SIMETH 200-200-20 MG/5ML PO SUSP
30.0000 mL | Freq: Once | ORAL | Status: AC
  Administered 2022-09-24: 30 mL via ORAL
  Filled 2022-09-24: qty 30

## 2022-09-24 MED ORDER — FAMOTIDINE 20 MG PO TABS
40.0000 mg | ORAL_TABLET | Freq: Once | ORAL | Status: AC
  Administered 2022-09-24: 40 mg via ORAL
  Filled 2022-09-24: qty 2

## 2022-09-24 MED ORDER — METFORMIN HCL 500 MG PO TABS
500.0000 mg | ORAL_TABLET | Freq: Once | ORAL | Status: AC
  Administered 2022-09-24: 500 mg via ORAL
  Filled 2022-09-24: qty 1

## 2022-09-24 MED ORDER — IOHEXOL 300 MG/ML  SOLN
100.0000 mL | Freq: Once | INTRAMUSCULAR | Status: AC | PRN
  Administered 2022-09-24: 100 mL via INTRAVENOUS

## 2022-09-24 NOTE — ED Notes (Signed)
Patient instructed not to drink right away after Maalox administration.

## 2022-09-24 NOTE — ED Provider Notes (Signed)
[facility] Provider Note  Patient Contact: 3:15 PM (approximate)   History   Abdominal Pain (Midline upper abdominal pain that began earlier this week, states the pain worsens when he drinks water)   HPI  Joseph [name] is a 51 y.o. male with a history of diabetes, hypertension, hyperlipidemia, CHF, asthma and obstructive sleep apnea, presents to the emergency department with severe epigastric and left upper quadrant abdominal pain.  Patient states that he thought he had a stomach virus earlier in the week but his symptoms seem to resolve and then pain started again last night.  He said associated vomiting and diarrhea.  No chest pain or chest tightness.  He states that he has had slightly worsening cough from his baseline is associated this with his CHF.  Patient has had chills but no fever.      Physical Exam   Triage Vital Signs: ED Triage Vitals  Enc Vitals Group     BP 09/24/22 1416 122/85     Pulse Rate 09/24/22 1416 84     Resp --      Temp 09/24/22 1416 98.4 F (36.9 C)     Temp Source 09/24/22 1416 Oral     SpO2 09/24/22 1416 98 %     Weight 09/24/22 1414 220 lb (99.8 kg)     Height 09/24/22 1414 '6\' 1"'$  (1.854 m)     Head Circumference --      Peak Flow --      Pain Score 09/24/22 1425 10     Pain Loc --      Pain Edu? --      Excl. in [place]? --     Most recent vital signs: Vitals:   09/24/22 1416 09/24/22 1807  BP: 122/85 (!) 144/83  Pulse: 84 68  Resp:  16  Temp: 98.4 F (36.9 C)   SpO2: 98% 98%     General: Alert and in no acute distress. Eyes:  PERRL. EOMI. Head: No acute traumatic findings ENT:      Nose: No congestion/rhinnorhea.      Mouth/Throat: Mucous membranes are moist. Neck: No stridor. No cervical spine tenderness to palpation. Cardiovascular:  Good peripheral perfusion Respiratory: Normal respiratory effort without tachypnea or retractions. Lungs CTAB. Good air entry to the bases with no decreased or absent breath  sounds. Gastrointestinal: Bowel sounds 4 quadrants. Soft tender to palpation with guarding. No palpable masses. No distention. No CVA tenderness. Musculoskeletal: Full range of motion to all extremities.  Neurologic:  No gross focal neurologic deficits are appreciated.  Skin:   No rash noted    ED Results / Procedures / Treatments   Labs (all labs ordered are listed, but only abnormal results are displayed) Labs Reviewed  COMPREHENSIVE METABOLIC PANEL - Abnormal; Notable for the following components:      Result Value   Sodium 131 (*)    Potassium 5.4 (*)    CO2 19 (*)    Glucose, Bld 551 (*)    BUN 41 (*)    Creatinine, Ser 1.48 (*)    Calcium 8.6 (*)    ALT 58 (*)    Total Bilirubin 1.4 (*)    GFR, Estimated 57 (*)    All other components within normal limits  URINALYSIS, ROUTINE W REFLEX MICROSCOPIC - Abnormal; Notable for the following components:   Color, Urine YELLOW (*)    APPearance CLEAR (*)    Glucose, UA >=500 (*)    Ketones, ur  5 (*)    Bacteria, UA RARE (*)    All other components within normal limits  CBG MONITORING, ED - Abnormal; Notable for the following components:   Glucose-Capillary 441 (*)    All other components within normal limits  CBG MONITORING, ED - Abnormal; Notable for the following components:   Glucose-Capillary 371 (*)    All other components within normal limits  RESP PANEL BY RT-PCR (RSV, FLU A&B, COVID)  RVPGX2  LIPASE, BLOOD  CBC  TROPONIN I (HIGH SENSITIVITY)        RADIOLOGY  I personally viewed and evaluated these images as part of my medical decision making, as well as reviewing the written report by the radiologist.  ED Provider Interpretation: No acute abnormality on chest x-ray or CT abdomen pelvis.   PROCEDURES:  Critical Care performed: No  Procedures   MEDICATIONS ORDERED IN ED: Medications  alum & mag hydroxide-simeth (MAALOX/MYLANTA) 200-200-20 MG/5ML suspension 30 mL (has no administration in time  range)  famotidine (PEPCID) tablet 40 mg (has no administration in time range)  sodium chloride 0.9 % bolus 1,000 mL (0 mLs Intravenous Stopped 09/24/22 1739)  insulin aspart (novoLOG) injection 10 Units (10 Units Subcutaneous Given 09/24/22 1611)  iohexol (OMNIPAQUE) 300 MG/ML solution 100 mL (100 mLs Intravenous Contrast Given 09/24/22 1624)  metFORMIN (GLUCOPHAGE) tablet 500 mg (500 mg Oral Given 09/24/22 1713)     IMPRESSION / MDM / ASSESSMENT AND PLAN / ED COURSE  I reviewed the triage vital signs and the nursing notes.                              Assessment and plan Abdominal pain:  Epigastric abdominal pain Hyperglycemia  51 year old male presents to the emergency department with severe epigastric and left upper quadrant tenderness and pain with associated diarrhea and vomiting.  Vital signs were reassuring at triage.  On exam, patient was alert and nontoxic-appearing but did have left epigastric tenderness and left upper quadrant tenderness with guarding to palpation.  Differential diagnosis includes diverticulitis, DKA, hyperglycemia, pancreatitis, gastroenteritis..  Patient's glucose elevated at 551 with normal anion gap.  Potassium slightly elevated at 5.4.  Troponin within range.  EKG indicated normal sinus rhythm with no new ST segment elevation.  EKG similar to prior.  Patient given 10 units of subcu NovoLog and metformin as well as normal saline bolus   Patient's glucose trended down to 371 with NovoLog and metformin.  Will hold off on prescribing patient metformin chronically given CHF and chronic kidney disease.  No acute abnormality on chest x-ray.  CT abdomen pelvis unremarkable.  Recommended resuming patient's normal insulin dosing at home.  Patient was given GI cocktail in the emergency department for epigastric pain and started on Pepcid.  Suspect that patient had an unspecified gastroenteritis which cause some degree of gastritis secondary to vomiting.  Patient  reports he does not take anti-inflammatories daily or use alcohol daily  Return precautions were given to return with new or worsening symptoms.     FINAL CLINICAL IMPRESSION(S) / ED DIAGNOSES   Final diagnoses:  Epigastric pain     Rx / DC Orders   ED Discharge Orders          Ordered    famotidine (PEPCID) 20 MG tablet  2 times daily        09/24/22 1814             Note:  This  document was prepared using Systems analyst and may include unintentional dictation errors.   Vallarie Mare Calhan, PA-C 09/24/22 1833    Lavonia Drafts, MD 09/24/22 [phone]

## 2022-09-24 NOTE — ED Triage Notes (Signed)
Midline upper abdominal pain that began earlier this week, states the pain worsens when he drinks water

## 2022-09-24 NOTE — Discharge Instructions (Signed)
Resume your normal insulin dosing at home. Take 40 mg of Pepcid once daily. If symptoms worsen, please return to the emergency department for reevaluation

## 2022-09-29 ENCOUNTER — Encounter (HOSPITAL_COMMUNITY): Payer: BC Managed Care – PPO | Admitting: Cardiology

## 2022-09-30 DIAGNOSIS — I2510 Atherosclerotic heart disease of native coronary artery without angina pectoris: Secondary | ICD-10-CM | POA: Diagnosis not present

## 2022-09-30 DIAGNOSIS — Z0000 Encounter for general adult medical examination without abnormal findings: Secondary | ICD-10-CM | POA: Diagnosis not present

## 2022-09-30 DIAGNOSIS — I255 Ischemic cardiomyopathy: Secondary | ICD-10-CM | POA: Diagnosis not present

## 2022-09-30 DIAGNOSIS — G4733 Obstructive sleep apnea (adult) (pediatric): Secondary | ICD-10-CM | POA: Diagnosis not present

## 2022-09-30 DIAGNOSIS — E1065 Type 1 diabetes mellitus with hyperglycemia: Secondary | ICD-10-CM | POA: Diagnosis not present

## 2022-10-28 DIAGNOSIS — E1065 Type 1 diabetes mellitus with hyperglycemia: Secondary | ICD-10-CM | POA: Diagnosis not present

## 2022-10-28 DIAGNOSIS — E139 Other specified diabetes mellitus without complications: Secondary | ICD-10-CM | POA: Diagnosis not present

## 2022-10-29 ENCOUNTER — Ambulatory Visit (HOSPITAL_COMMUNITY)
Admission: RE | Admit: 2022-10-29 | Discharge: 2022-10-29 | Disposition: A | Discharge status: Home / Self Care | Payer: BC Managed Care – PPO | Source: Ambulatory Visit | Attending: Cardiology | Admitting: Cardiology

## 2022-10-29 ENCOUNTER — Encounter (HOSPITAL_COMMUNITY): Payer: Self-pay | Admitting: Cardiology

## 2022-10-29 VITALS — BP 104/60 | HR 66 | Wt 196.4 lb

## 2022-10-29 DIAGNOSIS — Z79899 Other long term (current) drug therapy: Secondary | ICD-10-CM | POA: Insufficient documentation

## 2022-10-29 DIAGNOSIS — I2510 Atherosclerotic heart disease of native coronary artery without angina pectoris: Secondary | ICD-10-CM | POA: Diagnosis not present

## 2022-10-29 DIAGNOSIS — I252 Old myocardial infarction: Secondary | ICD-10-CM | POA: Insufficient documentation

## 2022-10-29 DIAGNOSIS — I255 Ischemic cardiomyopathy: Secondary | ICD-10-CM | POA: Insufficient documentation

## 2022-10-29 DIAGNOSIS — G4733 Obstructive sleep apnea (adult) (pediatric): Secondary | ICD-10-CM | POA: Diagnosis not present

## 2022-10-29 DIAGNOSIS — E109 Type 1 diabetes mellitus without complications: Secondary | ICD-10-CM | POA: Diagnosis not present

## 2022-10-29 DIAGNOSIS — I110 Hypertensive heart disease with heart failure: Secondary | ICD-10-CM | POA: Diagnosis not present

## 2022-10-29 DIAGNOSIS — J45909 Unspecified asthma, uncomplicated: Secondary | ICD-10-CM | POA: Insufficient documentation

## 2022-10-29 DIAGNOSIS — I5022 Chronic systolic (congestive) heart failure: Secondary | ICD-10-CM | POA: Diagnosis not present

## 2022-10-29 DIAGNOSIS — Z794 Long term (current) use of insulin: Secondary | ICD-10-CM | POA: Diagnosis not present

## 2022-10-29 LAB — BASIC METABOLIC PANEL
Anion gap: 7 (ref 5–15)
BUN: 10 mg/dL (ref 6–20)
CO2: 27 mmol/L (ref 22–32)
Calcium: 8.4 mg/dL — ABNORMAL LOW (ref 8.9–10.3)
Chloride: 105 mmol/L (ref 98–111)
Creatinine, Ser: 1.06 mg/dL (ref 0.61–1.24)
GFR, Estimated: >60 mL/min (ref >60)
Glucose, Bld: 71 mg/dL (ref 70–99)
Potassium: 3.8 mmol/L (ref 3.5–5.1)
Sodium: 139 mmol/L (ref 135–145)

## 2022-10-29 LAB — LIPID PANEL
Cholesterol: 124 mg/dL (ref 0–200)
HDL: 45 mg/dL (ref >40)
LDL Cholesterol: 65 mg/dL (ref 0–99)
Total CHOL/HDL Ratio: 2.8 RATIO
Triglycerides: 69 mg/dL (ref <150)
VLDL: 14 mg/dL (ref 0–40)

## 2022-10-29 LAB — DIGOXIN LEVEL: Digoxin Level: 0.4 ng/mL — ABNORMAL LOW (ref 0.8–2.0)

## 2022-10-29 LAB — BRAIN NATRIURETIC PEPTIDE: B Natriuretic Peptide: 129.4 pg/mL — ABNORMAL HIGH (ref 0.0–100.0)

## 2022-10-29 NOTE — Patient Instructions (Signed)
There is no changes to your medications.  Labs done today, your results will be available in MyChart, we will contact you for abnormal readings.  Your physician has requested that you have an echocardiogram. Echocardiography is a painless test that uses sound waves to create images of your heart. It provides your doctor with information about the size and shape of your heart and how well your heart's chambers and valves are working. This procedure takes approximately one hour. There are no restrictions for this procedure. Please do NOT wear cologne, perfume, aftershave, or lotions (deodorant is allowed). Please arrive 15 minutes prior to your appointment time.  Your physician recommends that you schedule a follow-up appointment in: 3 months at the [facility] office with Dr. Aundra Dubin. You will be called to have this appointment arranged   If you have any questions or concerns before your next appointment please send [place] a message through [place] or call our office at [phone].    TO LEAVE A MESSAGE FOR THE NURSE SELECT OPTION 2, PLEASE LEAVE A MESSAGE INCLUDING: YOUR NAME DATE OF BIRTH CALL BACK NUMBER REASON FOR CALL**this is important as we prioritize the call backs  YOU WILL RECEIVE A CALL BACK THE SAME DAY AS LONG AS YOU CALL BEFORE 4:00 PM  At the [facility], you and your health needs are our priority. As part of our continuing mission to provide you with exceptional heart care, we have created designated Provider Care Teams. These Care Teams include your primary Cardiologist (physician) and Advanced Practice Providers (APPs- Physician Assistants and Nurse Practitioners) who all work together to provide you with the care you need, when you need it.   You may see any of the following providers on your designated Care Team at your next follow up: Dr Glori Bickers Dr Loralie Champagne Dr. Roxana Hires, NP Lyda Jester, [place] [facility] [place], [place] Forestine Na, NP Audry Riles, PharmD   Please be sure to bring in all your medications bottles to every appointment.

## 2022-10-31 NOTE — Progress Notes (Signed)
[facility] Progress Note    PCP: Dr. Ginette Pitman Cardiology: Dr. Saralyn Pilar HF Cardiology: Dr. Aundra Dubin  52 y.o. with history of type 1 DM, HTN, CAD, chronic systolic CHF, severe asthma, OSA not adherent with CPAP.  Patient had anterior STEMI with DES to proximal LAD in 6/11.  Echo at the time with EF < 35%.  Patient eventually had [employer] ICD placed.  LV systolic function has been low since 6/11 MI.  He presented again in 2/18 with chest pain and dyspnea as well as anterior STE on ECG, but cath showed only moderate proximal LAD in-stent restenosis, not explaining his ECG changes.  Most recent echo in 1/23 showed EF 25-30%, normal RV, mild-moderate MR.   CPX 5/23>>Submaximal test. Moderate to severe functional limitation due to a combination of HF and moderate to severe restrictive lung physiology due to patient's body habitus. Chronotropic incompetence was noted. Suspect lung limitation predominates.   Diagnosed w/ COVID 8/23 but did not require hospitalization. Recovered at home. Most of symptoms resolved.   He returns for followup of CHF.  Weight is down significantly, 23 lbs.  He has problems with dyspnea "on and off," currently breathing is doing well.  He gets short of breath with stairs. No orthopnea/PND.  No chest pain.  Situation is complicated with severe asthma. He has not been wheezing recently.  He continues to drive a forklift.    Has not been following with pulmonary for his asthma or a provider for his CPAP. Has not used CPAP in over a year.  ECG (personally reviewed): NSR, IVCD 126 msec, RVH  Labs (12/23): K 5.4, creatinine 1.48, glucose 508 (labs drawn in setting of marked hyperglycemia).   PMH: 1. Type 1 diabetes: Diagnosis in 1997. H/o DKA.  2. GERD 3. Hyperlipidemia 4. HTN 5. OSA: Not adherent with CPAP 6. H/o seizure 7. CAD: 6/11 anterior MI with PCI to LAD.  - 2/18 STEMI by ECG => cath with moderate in-stent restenosis in the ostial/proximal LAD  stent but did not explain ECG changes (?recanalized).   8. Chronic systolic CHF: Ischemic cardiomyopathy. [place].  - Echo (6/11): EF < 35%.  - Echo (2017): EF 40% - Echo (2/18): EF 25-30% - Echo (1/23): EF 25-30%, normal RV, mild-moderate MR.  - CPX (5/23): VE/VCO2 slope 31, VO2 15.8, RER 1.0.  Submaximal. Probably mild HF limitation with further limitation due to deconditioning and moderate restriction by spirometry.  9. Severe asthma - Previously seen by Dr. Lanney Gins in 2021  Social History   Socioeconomic History   Marital status: Divorced    Spouse name: Not on file   Number of children: Not on file   Years of education: Not on file   Highest education level: Not on file  Occupational History   Occupation: Glass blower/designer  Tobacco Use   Smoking status: Never   Smokeless tobacco: Never  Vaping Use   Vaping Use: Never used  Substance and Sexual Activity   Alcohol use: No   Drug use: No   Sexual activity: Yes  Other Topics Concern   Not on file  Social History Narrative   Not on file   Social Determinants of Health   Financial Resource Strain: Not on file  Food Insecurity: Not on file  Transportation Needs: Not on file  Physical Activity: Not on file  Stress: Not on file  Social Connections: Not on file  Intimate Partner Violence: Not on file   Family History  Problem  Relation Age of Onset   Hypertension Mother    Heart attack Paternal Grandmother    Heart attack Father    Bladder Cancer Neg Hx    Kidney cancer Neg Hx    Prostate cancer Neg Hx    ROS: all systems reviewed and negative except as per HPI.   Current Outpatient Medications  Medication Sig Dispense Refill   acetaminophen (TYLENOL) 500 MG tablet Take 500 mg by mouth every 6 (six) hours as needed for fever.     albuterol (PROVENTIL HFA) 108 (90 Base) MCG/ACT inhaler Inhale 2 puffs into the lungs every 4 (four) hours as needed for wheezing or shortness of breath. 1 Inhaler 0    aspirin EC 81 MG tablet Take 81 mg by mouth every morning.     atorvastatin (LIPITOR) 80 MG tablet Take 1 tablet (80 mg total) by mouth daily. 90 tablet 3   bisoprolol (ZEBETA) 10 MG tablet Take 1 tablet (10 mg total) by mouth daily. 90 tablet 3   Cholecalciferol 25 MCG (1000 UT) capsule Take 1,000 Units by mouth daily.     digoxin (LANOXIN) 0.125 MG tablet Take 1 tablet (0.125 mg total) by mouth daily. 30 tablet 6   fluticasone (FLONASE) 50 MCG/ACT nasal spray Place 2 sprays into both nostrils daily as needed for allergies.      glucagon, human recombinant, (GLUCAGEN) 1 MG injection Inject into the muscle as needed.     insulin aspart (NOVOLOG) 100 UNIT/ML FlexPen Inject 18-20-22 u TID AC plus sliding scale for up to 75 units TDD     insulin degludec (TRESIBA FLEXTOUCH) 200 UNIT/ML FlexTouch Pen Inject 60 Units into the skin daily.     Insulin Pen Needle (B-D ULTRAFINE III SHORT PEN) 31G X 8 MM MISC USE 5 TIMES DAILY AS  DIRECTED 75 each 0   loperamide (IMODIUM) 2 MG capsule PLEASE SEE ATTACHED FOR DETAILED DIRECTIONS     loratadine (CLARITIN) 10 MG tablet Take 10 mg by mouth daily as needed.      naproxen sodium (ALEVE) 220 MG tablet Take 220 mg by mouth daily as needed.     potassium chloride (KLOR-CON M10) 10 MEQ tablet TAKE 2 TABLETS BY MOUTH DAILY 180 tablet 3   sacubitril-valsartan (ENTRESTO) 97-103 MG TAKE 1 TABLET BY MOUTH TWICE A DAY. STOP LOSARTAN 60 tablet 6   spironolactone (ALDACTONE) 25 MG tablet Take 1 tablet (25 mg total) by mouth daily. 90 tablet 3   SYMBICORT 160-4.5 MCG/ACT inhaler Inhale 2 puffs into the lungs 2 (two) times daily.     tadalafil (CIALIS) 20 MG tablet Take 1 tablet (20 mg total) by mouth daily as needed for erectile dysfunction. 30 tablet 11   torsemide (DEMADEX) 20 MG tablet Take 60 mg by mouth daily.     traMADol (ULTRAM) 50 MG tablet Take 50 mg by mouth every 4 (four) hours as needed.     No current facility-administered medications for this encounter.    BP 104/60   Pulse 66   Wt 89.1 kg (196 lb 6.4 oz)   SpO2 96%   BMI 25.91 kg/m  PHYSICAL EXAM: General: NAD Neck: JVP 7-8 cm, no thyromegaly or thyroid nodule.  Lungs: Clear to auscultation bilaterally with normal respiratory effort. CV: Nondisplaced PMI.  Heart regular S1/S2, no S3/S4, no murmur.  Trace ankle edema.  No carotid bruit.  Normal pedal pulses.  Abdomen: Soft, nontender, no hepatosplenomegaly, no distention.  Skin: Intact without lesions or rashes.  Neurologic: Alert  and oriented x 3.  Psych: Normal affect. Extremities: No clubbing or cyanosis.  HEENT: Normal.   Assessment/Plan: 1. CAD: S/p anterior MI in 2011. Denies chest pain  - Continue ASA 81 daily.  - Atorvastatin 80 mg, check lipids today. 2. Chronic systolic CHF: Ischemic cardiomyopathy with [place].  EF has been low since 2011.  Most recent echo in 1/23 with EF 25-30%, normal RV, mild-moderate MR. CPX 5/23 was a submax imal, mild HF limitation but additional function limitation due to deconditioning and moderate restrictive lung disease by PFTs.  NYHA class II-III symptoms (waxes/wanes).  He is not volume overloaded on exam.  - Continue torsemide 60 mg daily, check BMET/BNP today.  - Continue bisoprolol 10 mg daily.  - Continue spironolactone 25 mg daily.  - Continue digoxin, check level today.  - Continue Entresto 97/103 bid.   - No SGLT2 inhibitor due to type 1 diabetes.   - BP appears too low to start Bidil.  - I will arrange for echo.  - I will have him evaluated today for [employer] trial for barostimulation activation therapy.  3. Type 1 diabetes: Following with Endocrine - Has not been well-controlled 4. Asthma:  Severe per prior notes.  Not actively wheezing.  - Needs followup with pulmonology.  5. OSA: He has not tolerated CPAP.   F/u 3 months at [facility] HF office.   Loralie Champagne,  10/31/2022

## 2022-11-03 ENCOUNTER — Telehealth (HOSPITAL_COMMUNITY): Payer: Self-pay | Admitting: *Deleted

## 2022-11-03 NOTE — Telephone Encounter (Signed)
Echo auth  Order ID: [id number]       Completed  Approval Valid Through: 11/03/2022 - 12/02/2022

## 2022-11-08 ENCOUNTER — Emergency Department: Payer: BC Managed Care – PPO

## 2022-11-08 ENCOUNTER — Other Ambulatory Visit: Payer: Self-pay

## 2022-11-08 ENCOUNTER — Encounter: Payer: Self-pay | Admitting: Emergency Medicine

## 2022-11-08 ENCOUNTER — Emergency Department
Admission: EM | Admit: 2022-11-08 | Discharge: 2022-11-08 | Disposition: A | Discharge status: Home / Self Care | Payer: BC Managed Care – PPO | Attending: Emergency Medicine | Admitting: Emergency Medicine

## 2022-11-08 DIAGNOSIS — I10 Essential (primary) hypertension: Secondary | ICD-10-CM | POA: Diagnosis not present

## 2022-11-08 DIAGNOSIS — E1165 Type 2 diabetes mellitus with hyperglycemia: Secondary | ICD-10-CM | POA: Insufficient documentation

## 2022-11-08 DIAGNOSIS — R059 Cough, unspecified: Secondary | ICD-10-CM | POA: Diagnosis not present

## 2022-11-08 DIAGNOSIS — U071 COVID-19: Secondary | ICD-10-CM | POA: Diagnosis not present

## 2022-11-08 DIAGNOSIS — R0602 Shortness of breath: Secondary | ICD-10-CM | POA: Diagnosis not present

## 2022-11-08 DIAGNOSIS — R739 Hyperglycemia, unspecified: Secondary | ICD-10-CM

## 2022-11-08 LAB — RESP PANEL BY RT-PCR (RSV, FLU A&B, COVID)  RVPGX2
Influenza A by PCR: NEGATIVE
Influenza B by PCR: NEGATIVE
Resp Syncytial Virus by PCR: NEGATIVE
SARS Coronavirus 2 by RT PCR: POSITIVE — AB

## 2022-11-08 LAB — BASIC METABOLIC PANEL
Anion gap: 10 (ref 5–15)
BUN: 18 mg/dL (ref 6–20)
CO2: 28 mmol/L (ref 22–32)
Calcium: 8.5 mg/dL — ABNORMAL LOW (ref 8.9–10.3)
Chloride: 95 mmol/L — ABNORMAL LOW (ref 98–111)
Creatinine, Ser: 1.28 mg/dL — ABNORMAL HIGH (ref 0.61–1.24)
GFR, Estimated: >60 mL/min (ref >60)
Glucose, Bld: 376 mg/dL — ABNORMAL HIGH (ref 70–99)
Potassium: 3.7 mmol/L (ref 3.5–5.1)
Sodium: 133 mmol/L — ABNORMAL LOW (ref 135–145)

## 2022-11-08 LAB — CBC
HCT: 42 % (ref 39.0–52.0)
Hemoglobin: 13.8 g/dL (ref 13.0–17.0)
MCH: 30.5 pg (ref 26.0–34.0)
MCHC: 32.9 g/dL (ref 30.0–36.0)
MCV: 92.7 fL (ref 80.0–100.0)
Platelets: 213 10*3/uL (ref 150–400)
RBC: 4.53 MIL/uL (ref 4.22–5.81)
RDW: 12.8 % (ref 11.5–15.5)
WBC: 5.2 10*3/uL (ref 4.0–10.5)
nRBC: 0 % (ref 0.0–0.2)

## 2022-11-08 LAB — TROPONIN I (HIGH SENSITIVITY): Troponin I (High Sensitivity): 9 ng/L (ref <18)

## 2022-11-08 NOTE — Discharge Instructions (Addendum)
You are seen in the emergency department and diagnosed with COVID.  Talk with your primary care physician about whether you would want to start Paxlovid.  Your glucose was mildly elevated in the emergency department it is importantly keep a close eye after your glucose while you are sick.  Return to the emergency department if you have any worsening shortness of breath or signs of dehydration.  Pain control:  Ibuprofen (motrin/aleve/advil) - You can take 3-4 tablets (600-800 mg) every 6 hours as needed for pain/fever.  Acetaminophen (tylenol) - You can take 2 extra strength tablets (1000 mg) every 6 hours as needed for pain/fever.  You can alternate these medications or take them together.  Make sure you eat food/drink water when taking these medications.

## 2022-11-08 NOTE — ED Provider Notes (Signed)
[facility] Provider Note    Event Date/Time   First MD Initiated Contact with Patient 11/08/22 1146     (approximate)   History   Shortness of Breath and Cough   HPI  Joseph [name] is a 52 y.o. male with past medical history significant for hypertension, hyperlipidemia, diabetes, who presents to the emergency department for not feeling well.  Endorses 3 days of not feeling well with cough, congestion and bodyaches.  Endorses some mild shortness of breath.  Denies any chest pain.  Denies any fever or chills.  No recent antibiotic use.     Physical Exam   Triage Vital Signs: ED Triage Vitals  Enc Vitals Group     BP 11/08/22 1109 121/76     Pulse Rate 11/08/22 1109 89     Resp 11/08/22 1109 18     Temp 11/08/22 1109 98.7 F (37.1 C)     Temp Source 11/08/22 1109 Oral     SpO2 11/08/22 1109 97 %     Weight 11/08/22 1059 216 lb 0.8 oz (98 kg)     Height 11/08/22 1059 '6\' 1"'$  (1.854 m)     Head Circumference --      Peak Flow --      Pain Score 11/08/22 1110 0     Pain Loc --      Pain Edu? --      Excl. in [place]? --     Most recent vital signs: Vitals:   11/08/22 1109  BP: 121/76  Pulse: 89  Resp: 18  Temp: 98.7 F (37.1 C)  SpO2: 97%    Physical Exam Constitutional:      Appearance: He is well-developed.  HENT:     Head: Atraumatic.  Eyes:     Conjunctiva/sclera: Conjunctivae normal.  Cardiovascular:     Rate and Rhythm: Regular rhythm.  Pulmonary:     Effort: No respiratory distress.  Musculoskeletal:     Cervical back: Normal range of motion.  Skin:    General: Skin is warm.     Capillary Refill: Capillary refill takes less than 2 seconds.  Neurological:     Mental Status: He is alert. Mental status is at baseline.     IMPRESSION / MDM / ASSESSMENT AND PLAN / ED COURSE  I reviewed the triage vital signs and the nursing notes.  Differential diagnosis including viral illness including COVID/influenza, bacterial pneumonia,  ACS, viral illness, electrolyte abnormality, dehydration.  Low suspicion for pulmonary embolism.  Low risk Wells criteria no active shortness of breath at this time.  98% on room air.  EKG  I, Nathaniel Man, the attending physician, personally viewed and interpreted this ECG.   Rate: Normal  Rhythm: Normal sinus  Axis: Right  Intervals: Bundle branch block  ST&T Change: None No significant change when compared to prior EKG  No tachycardic or bradycardic dysrhythmias while on cardiac telemetry.  RADIOLOGY I independently reviewed imaging, my interpretation of imaging: Chest x-ray without focal findings consistent with pneumonia no signs of pulmonary edema.  Read as no acute findings.  LABS (all labs ordered are listed, but only abnormal results are displayed) Labs interpreted as -  COVID test is positive.  No significant anemia.  Troponin negative low suspicion for ACS.  Labs Reviewed  RESP PANEL BY RT-PCR (RSV, FLU A&B, COVID)  RVPGX2 - Abnormal; Notable for the following components:      Result Value   SARS Coronavirus 2 by RT PCR  POSITIVE (*)    All other components within normal limits  BASIC METABOLIC PANEL - Abnormal; Notable for the following components:   Sodium 133 (*)    Chloride 95 (*)    Glucose, Bld 376 (*)    Creatinine, Ser 1.28 (*)    Calcium 8.5 (*)    All other components within normal limits  CBC  TROPONIN I (HIGH SENSITIVITY)    TREATMENT    Shared decision making with the patient patient wants to talk with his primary care physician about whether or not to start Paxlovid.  Given return precautions for any worsening symptoms.  Patient does have hyperglycemia but no signs of DKA.  Discussed with the patient and he wants to go home and recheck his glucose at home and do his home insulin regimen.  PROCEDURES:  Critical Care performed: No  Procedures  Patient's presentation is most consistent with acute presentation with potential threat to life or  bodily function.   MEDICATIONS ORDERED IN ED: Medications - No data to display  FINAL CLINICAL IMPRESSION(S) / ED DIAGNOSES   Final diagnoses:  COVID  Hyperglycemia     Rx / DC Orders   ED Discharge Orders     None        Note:  This document was prepared using Dragon voice recognition software and may include unintentional dictation errors.   Nathaniel Man, MD 11/08/22 1230

## 2022-11-08 NOTE — ED Triage Notes (Addendum)
Pt sts that he has been having cough, head congestion, body chills for the last three days this SOB.

## 2022-11-16 ENCOUNTER — Other Ambulatory Visit: Payer: Self-pay | Admitting: *Deleted

## 2022-11-16 DIAGNOSIS — C61 Malignant neoplasm of prostate: Secondary | ICD-10-CM

## 2022-11-18 ENCOUNTER — Other Ambulatory Visit (HOSPITAL_COMMUNITY): Payer: Self-pay | Admitting: Cardiology

## 2022-11-23 ENCOUNTER — Telehealth (HOSPITAL_COMMUNITY): Payer: Self-pay | Admitting: Vascular Surgery

## 2022-11-23 ENCOUNTER — Ambulatory Visit (HOSPITAL_COMMUNITY)
Admission: RE | Admit: 2022-11-23 | Discharge: 2022-11-23 | Disposition: A | Discharge status: Home / Self Care | Payer: BC Managed Care – PPO | Source: Ambulatory Visit | Attending: Cardiology | Admitting: Cardiology

## 2022-11-23 DIAGNOSIS — I5022 Chronic systolic (congestive) heart failure: Secondary | ICD-10-CM | POA: Diagnosis not present

## 2022-11-23 DIAGNOSIS — I2510 Atherosclerotic heart disease of native coronary artery without angina pectoris: Secondary | ICD-10-CM | POA: Diagnosis not present

## 2022-11-23 DIAGNOSIS — I429 Cardiomyopathy, unspecified: Secondary | ICD-10-CM | POA: Insufficient documentation

## 2022-11-23 DIAGNOSIS — E785 Hyperlipidemia, unspecified: Secondary | ICD-10-CM | POA: Diagnosis not present

## 2022-11-23 DIAGNOSIS — I110 Hypertensive heart disease with heart failure: Secondary | ICD-10-CM | POA: Insufficient documentation

## 2022-11-23 DIAGNOSIS — I252 Old myocardial infarction: Secondary | ICD-10-CM | POA: Diagnosis not present

## 2022-11-23 DIAGNOSIS — E119 Type 2 diabetes mellitus without complications: Secondary | ICD-10-CM | POA: Diagnosis not present

## 2022-11-23 LAB — ECHOCARDIOGRAM COMPLETE
AR max vel: 2.53 cm2
AV Area VTI: 2.29 cm2
AV Area mean vel: 2.41 cm2
AV Mean grad: 5.3 mmHg
AV Peak grad: 9.3 mmHg
Ao pk vel: 1.52 m/s
Area-P 1/2: 3.99 cm2
Calc EF: 39.8 %
Est EF: 35
MV M vel: 3.18 m/s
MV Peak grad: 40.4 mmHg
S' Lateral: 4.3 cm
Single Plane A2C EF: 50.2 %
Single Plane A4C EF: 30.9 %

## 2022-11-23 NOTE — Telephone Encounter (Signed)
I spoke with patient, he complained of shortness of breath,  but denied swelling or weight gain. He did mention he has some dizziness and and his shortness of breath is worse with daily activities. He has to take a lot of breaks when showering and dressing.I spoke with heather and we scheduled him to see Dr. Aundra Dubin tomorrow at [facility] at 10:40am. Patient is agreeable to appointment.

## 2022-11-23 NOTE — Telephone Encounter (Signed)
Pt is here for an echo @ 3pm, he would like to speak to [facility] or a nurse pt is extremely SOB.Marland Kitchen PLEASE ADVISE

## 2022-11-23 NOTE — Progress Notes (Signed)
  Echocardiogram 2D Echocardiogram has been performed.  Lana Fish 11/23/2022, 3:48 PM

## 2022-11-24 ENCOUNTER — Other Ambulatory Visit
Admission: RE | Admit: 2022-11-24 | Discharge: 2022-11-24 | Disposition: A | Discharge status: Home / Self Care | Payer: BC Managed Care – PPO | Source: Ambulatory Visit | Attending: Cardiology | Admitting: Cardiology

## 2022-11-24 ENCOUNTER — Other Ambulatory Visit (HOSPITAL_COMMUNITY): Payer: Self-pay

## 2022-11-24 ENCOUNTER — Telehealth (HOSPITAL_COMMUNITY): Payer: Self-pay | Admitting: Licensed Clinical Social Worker

## 2022-11-24 ENCOUNTER — Encounter: Payer: Self-pay | Admitting: Cardiology

## 2022-11-24 ENCOUNTER — Ambulatory Visit (HOSPITAL_BASED_OUTPATIENT_CLINIC_OR_DEPARTMENT_OTHER): Payer: BC Managed Care – PPO | Admitting: Cardiology

## 2022-11-24 VITALS — BP 110/58 | HR 76 | Wt 216.2 lb

## 2022-11-24 DIAGNOSIS — I5022 Chronic systolic (congestive) heart failure: Secondary | ICD-10-CM | POA: Diagnosis not present

## 2022-11-24 DIAGNOSIS — J45909 Unspecified asthma, uncomplicated: Secondary | ICD-10-CM

## 2022-11-24 LAB — BASIC METABOLIC PANEL
Anion gap: 9 (ref 5–15)
BUN: 13 mg/dL (ref 6–20)
CO2: 27 mmol/L (ref 22–32)
Calcium: 8.8 mg/dL — ABNORMAL LOW (ref 8.9–10.3)
Chloride: 104 mmol/L (ref 98–111)
Creatinine, Ser: 1.1 mg/dL (ref 0.61–1.24)
GFR, Estimated: >60 mL/min (ref >60)
Glucose, Bld: 138 mg/dL — ABNORMAL HIGH (ref 70–99)
Potassium: 3.7 mmol/L (ref 3.5–5.1)
Sodium: 140 mmol/L (ref 135–145)

## 2022-11-24 LAB — DIGOXIN LEVEL: Digoxin Level: 0.6 ng/mL — ABNORMAL LOW (ref 0.8–2.0)

## 2022-11-24 LAB — BRAIN NATRIURETIC PEPTIDE: B Natriuretic Peptide: 45.3 pg/mL (ref 0.0–100.0)

## 2022-11-24 MED ORDER — ISOSORB DINITRATE-HYDRALAZINE 20-37.5 MG PO TABS: 0.5000 | ORAL_TABLET | Freq: Three times a day (TID) | ORAL | 3 refills | Status: DC

## 2022-11-24 NOTE — Progress Notes (Signed)
[facility] Progress Note    PCP: Dr. Ginette Pitman Cardiology: Dr. Saralyn Pilar HF Cardiology: Dr. Aundra Dubin  52 y.o. with history of type 1 DM, HTN, CAD, chronic systolic CHF, severe asthma, OSA not adherent with CPAP.  Patient had anterior STEMI with DES to proximal LAD in 6/11.  Echo at the time with EF < 35%.  Patient eventually had [employer] ICD placed.  LV systolic function has been low since 6/11 MI.  He presented again in 2/18 with chest pain and dyspnea as well as anterior STE on ECG, but cath showed only moderate proximal LAD in-stent restenosis, not explaining his ECG changes.  Most recent echo in 1/23 showed EF 25-30%, normal RV, mild-moderate MR.   CPX 5/23>>Submaximal test. Moderate to severe functional limitation due to a combination of HF and moderate to severe restrictive lung physiology due to patient's body habitus. Chronotropic incompetence was noted. Suspect lung limitation predominates.   Diagnosed w/ COVID 8/23 but did not require hospitalization. Patient had COVID-19 again in 1/24.    Echo in 2/24 showed EF 35%, septal/apical akinesis, normal RV.    He returns for followup of CHF.  Weight is stable.  He feels more short of breath since recent COVID-19 diagnosis. He has been coughing.  He is dyspneic after walking about 100 feet. Occasional wheezing.  No chest pain.  No lightheadedness. No palpitations.   Has not been following with pulmonary for his asthma or a provider for his CPAP. Has not used CPAP in over a year.  REDs clip 35%  Labs (12/23): K 5.4, creatinine 1.48, glucose 508 (labs drawn in setting of marked hyperglycemia). \ Labs (1/24): K 3.7, creatinine 1.28, LDL 65  PMH: 1. Type 1 diabetes: Diagnosis in 1997. H/o DKA.  2. GERD 3. Hyperlipidemia 4. HTN 5. OSA: Not adherent with CPAP 6. H/o seizure 7. CAD: 6/11 anterior MI with PCI to LAD.  - 2/18 STEMI by ECG => cath with moderate in-stent restenosis in the ostial/proximal LAD stent but  did not explain ECG changes (?recanalized).   8. Chronic systolic CHF: Ischemic cardiomyopathy. [place].  - Echo (6/11): EF < 35%.  - Echo (2017): EF 40% - Echo (2/18): EF 25-30% - Echo (1/23): EF 25-30%, normal RV, mild-moderate MR.  - CPX (5/23): VE/VCO2 slope 31, VO2 15.8, RER 1.0.  Submaximal. Probably mild HF limitation with further limitation due to deconditioning and moderate restriction by spirometry.  - Echo (2/24): EF 35%, septal/apical akinesis, normal RV.   9. Severe asthma - Previously seen by Dr. Lanney Gins in 2021  Social History   Socioeconomic History   Marital status: Divorced    Spouse name: Not on file   Number of children: Not on file   Years of education: Not on file   Highest education level: Not on file  Occupational History   Occupation: Glass blower/designer  Tobacco Use   Smoking status: Never   Smokeless tobacco: Never  Vaping Use   Vaping Use: Never used  Substance and Sexual Activity   Alcohol use: No   Drug use: No   Sexual activity: Yes  Other Topics Concern   Not on file  Social History Narrative   Not on file   Social Determinants of Health   Financial Resource Strain: Not on file  Food Insecurity: Not on file  Transportation Needs: Not on file  Physical Activity: Not on file  Stress: Not on file  Social Connections: Not on file  Intimate Partner  Violence: Not on file   Family History  Problem Relation Age of Onset   Hypertension Mother    Heart attack Paternal Grandmother    Heart attack Father    Bladder Cancer Neg Hx    Kidney cancer Neg Hx    Prostate cancer Neg Hx    ROS: all systems reviewed and negative except as per HPI.   Current Outpatient Medications  Medication Sig Dispense Refill   acetaminophen (TYLENOL) 500 MG tablet Take 500 mg by mouth every 6 (six) hours as needed for fever.     albuterol (PROVENTIL HFA) 108 (90 Base) MCG/ACT inhaler Inhale 2 puffs into the lungs every 4 (four) hours as needed for  wheezing or shortness of breath. 1 Inhaler 0   aspirin EC 81 MG tablet Take 81 mg by mouth every morning.     atorvastatin (LIPITOR) 80 MG tablet Take 1 tablet (80 mg total) by mouth daily. 90 tablet 3   bisoprolol (ZEBETA) 10 MG tablet Take 1 tablet (10 mg total) by mouth daily. 90 tablet 3   Cholecalciferol 25 MCG (1000 UT) capsule Take 1,000 Units by mouth daily.     digoxin (LANOXIN) 0.125 MG tablet TAKE 1 TABLET BY MOUTH DAILY 90 tablet 3   fluticasone (FLONASE) 50 MCG/ACT nasal spray Place 2 sprays into both nostrils daily as needed for allergies.      glucagon, human recombinant, (GLUCAGEN) 1 MG injection Inject into the muscle as needed.     insulin aspart (NOVOLOG) 100 UNIT/ML FlexPen Inject 18-20-22 u TID AC plus sliding scale for up to 75 units TDD     insulin degludec (TRESIBA FLEXTOUCH) 200 UNIT/ML FlexTouch Pen Inject 60 Units into the skin daily.     Insulin Pen Needle (B-D ULTRAFINE III SHORT PEN) 31G X 8 MM MISC USE 5 TIMES DAILY AS  DIRECTED 75 each 0   isosorbide-hydrALAZINE (BIDIL) 20-37.5 MG tablet Take 0.5 tablets by mouth 3 (three) times daily. 30 tablet 3   loperamide (IMODIUM) 2 MG capsule PLEASE SEE ATTACHED FOR DETAILED DIRECTIONS     loratadine (CLARITIN) 10 MG tablet Take 10 mg by mouth daily as needed.      naproxen sodium (ALEVE) 220 MG tablet Take 220 mg by mouth daily as needed.     potassium chloride (KLOR-CON M10) 10 MEQ tablet TAKE 2 TABLETS BY MOUTH DAILY 180 tablet 3   sacubitril-valsartan (ENTRESTO) 97-103 MG TAKE 1 TABLET BY MOUTH TWICE A DAY. STOP LOSARTAN 60 tablet 6   spironolactone (ALDACTONE) 25 MG tablet Take 1 tablet (25 mg total) by mouth daily. 90 tablet 3   SYMBICORT 160-4.5 MCG/ACT inhaler Inhale 2 puffs into the lungs 2 (two) times daily.     tadalafil (CIALIS) 20 MG tablet Take 1 tablet (20 mg total) by mouth daily as needed for erectile dysfunction. 30 tablet 11   torsemide (DEMADEX) 20 MG tablet Take 60 mg by mouth daily.     traMADol  (ULTRAM) 50 MG tablet Take 50 mg by mouth every 4 (four) hours as needed.     No current facility-administered medications for this visit.   BP (!) 110/58   Pulse 76   Wt 216 lb 3.2 oz (98.1 kg)   SpO2 99%   BMI 28.52 kg/m  PHYSICAL EXAM: General: NAD Neck: No JVD, no thyromegaly or thyroid nodule.  Lungs: Clear to auscultation bilaterally with normal respiratory effort. CV: Nondisplaced PMI.  Heart regular S1/S2, no S3/S4, no murmur.  No peripheral edema.  No carotid bruit.  Normal pedal pulses.  Abdomen: Soft, nontender, no hepatosplenomegaly, no distention.  Skin: Intact without lesions or rashes.  Neurologic: Alert and oriented x 3.  Psych: Normal affect. Extremities: No clubbing or cyanosis.  HEENT: Normal.   Assessment/Plan: 1. CAD: S/p anterior MI in 2011. Denies chest pain  - Continue ASA 81 daily.  - Atorvastatin 80 mg, good lipids in 1/24.  2. Chronic systolic CHF: Ischemic cardiomyopathy with [place].  EF has been low since 2011.  Most recent echo in 2/24 with EF 35%, normal RV. CPX 5/23 was a submaximal, mild HF limitation but additional function limitation due to deconditioning and moderate restrictive lung disease by PFTs.  NYHA class II-III symptoms, worse with recent COVID-19 infection.  He is borderline volume overloaded by REDS clip, 35%.  - Increase torsemide to 80 mg daily x 2 days then back to 60 mg daily. BMET/BNP today. - Continue bisoprolol 10 mg daily.  - Continue spironolactone 25 mg daily.  - Continue digoxin, check level today.  - Continue Entresto 97/103 bid.   - No SGLT2 inhibitor due to type 1 diabetes.   - Add Bidil 0.5 tab tid. - He is interested in [place] trial for barostimulation activation therapy, nurse navigator will contact him.  3. Type 1 diabetes: Following with Endocrine - Has not been well-controlled 4. Asthma:  Severe per prior notes.  Not actively wheezing.  - Needs followup with pulmonology, will refer.  5. OSA: Refer  for sleep medicine appt, needs to get back on CPAP.    F/u 2 months with APP.   Joseph [name],  11/24/2022

## 2022-11-24 NOTE — Telephone Encounter (Signed)
H&V Care Navigation CSW Progress Note  Clinical Social Worker consulted to speak with pt regarding disability.  CSW called pt and he wants to know process of applying for long term disability.  CSW explained different methods to apply- he will plan to start application online.  CSW explained process and how MD office is involved.  Encouraged him to reach out if he has any questions during the process and I would assist as able.   SDOH Screenings   Depression (PHQ2-9): Low Risk  (10/14/2021)  Tobacco Use: Low Risk  (11/24/2022)    Jorge Ny, LCSW Clinical Social Worker [facility] Desk#: [phone] Cell#: [phone]

## 2022-11-24 NOTE — Patient Instructions (Addendum)
Medication Changes:  Take Torsemide 62m (4 tablets) daily for two days. Then go back to taking 60 mg (3 tablets) of torsemide daily.   Lab Work:  Labs done today, your results will be available in MyChart, we will contact you for abnormal readings.    Referrals:  You have been referred to [facility] Their office will call you to schedule an appointment.    Social worker team will call you to speak with you regarding disability forms.  KJoelene Millinwill call you regarding [employer] .  Special Instructions // Education:  Do the following things EVERYDAY: Weigh yourself in the morning before breakfast. Write it down and keep it in a log. Take your medicines as prescribed Eat low salt foods--Limit salt (sodium) to 2000 mg per day.  Stay as active as you can everyday Limit all fluids for the day to less than 2 liters   Follow-Up in: you have a follow up appointment in two months. Please call our office to schedule your appointment.     If you have any questions or concerns before your next appointment please send [place] message through [email or web address] call our office at [phone]-Friday 8 am-5 pm.   If you have an urgent need after hours on the weekend please call your Primary Cardiologist or the [facility] [place] [phone]

## 2022-11-24 NOTE — Progress Notes (Signed)
REDS VEST READING= 35   CHEST RULER=35  VEST FITTING TASKS: POSTURE=sitting  HEIGHT MARKER=D CENTER STRIP=aligned

## 2022-11-25 ENCOUNTER — Other Ambulatory Visit: Payer: BC Managed Care – PPO

## 2022-11-25 DIAGNOSIS — C61 Malignant neoplasm of prostate: Secondary | ICD-10-CM

## 2022-11-26 LAB — PSA: Prostate Specific Ag, Serum: <0.1 ng/mL (ref 0.0–4.0)

## 2022-11-30 ENCOUNTER — Ambulatory Visit (INDEPENDENT_AMBULATORY_CARE_PROVIDER_SITE_OTHER): Payer: BC Managed Care – PPO | Admitting: Urology

## 2022-11-30 VITALS — BP 146/78 | HR 92 | Ht 73.0 in | Wt 216.0 lb

## 2022-11-30 DIAGNOSIS — N521 Erectile dysfunction due to diseases classified elsewhere: Secondary | ICD-10-CM

## 2022-11-30 DIAGNOSIS — Z8546 Personal history of malignant neoplasm of prostate: Secondary | ICD-10-CM | POA: Diagnosis not present

## 2022-11-30 DIAGNOSIS — E1169 Type 2 diabetes mellitus with other specified complication: Secondary | ICD-10-CM | POA: Diagnosis not present

## 2022-11-30 DIAGNOSIS — C61 Malignant neoplasm of prostate: Secondary | ICD-10-CM

## 2022-11-30 NOTE — Progress Notes (Signed)
I, DeAsia L Maxie,acting as a scribe for Hollice Espy, MD.,have documented all relevant documentation on the behalf of Hollice Espy, MD,as directed by  Hollice Espy, MD while in the presence of Hollice Espy, MD.   I, Lafferty as a scribe for Hollice Espy, MD.,have documented all relevant documentation on the behalf of Hollice Espy, MD,as directed by  Hollice Espy, MD while in the presence of Hollice Espy, MD.   11/30/22 12:03 PM   Joseph [name] 1971-06-28 [id number]  Referring provider: [facility], [facility] [place],  [place] [address]  Chief Complaint  Patient presents with   Prostate Cancer    HPI: 52 year-old male with a personal history of prostate cancer who returns today for an annual follow-up.   He had a XI robotic assisted laparoscopic radical prostatectomy on 01/03/20. His surgical pathology 11/07/19 gleason 3+4 with less than 5% of a tertiary pattern -EPE focal -urinary bladder neck -SV  +perinerual invasion +margin (right apical, left apical) - lymph nodes pT2 pN0.   His most recent PSA on 11/25/22 remains undetectable.  He has a personal history of severe ED. He was scheduled for IPP prior to prostatotectomy. We referred him a year ago to further discuss penile prosthesis and was prescribed Cialis. He saw Dr. Ollen Gross on November of 2023 and was scheduled for surgery. However, the surgery was postponed to June due to work-related scheduling conflicts.  He reports that the medication has not been effective for ED, and it was explained that the damage to nerves and blood vessels during his prostatectomy is likely irreversible at this point. No urinary incontinence or bladder problems reported.   PMH: Past Medical History:  Diagnosis Date   AICD (automatic cardioverter/defibrillator) present 2011   Asthma    Cancer ([place])    prostate   CHF (congestive heart failure) ([place])    Coronary artery disease    Diabetes  mellitus without complication ([place])    Dyspnea    ED (erectile dysfunction)    GERD (gastroesophageal reflux disease)    Hyperlipidemia    Hypertension    Ischemic cardiomyopathy    LADA (latent autoimmune diabetes in adults), managed as type 1 ([place])    Myocardial infarction ([place]) 2011   anterior MI at [facility] s/p BMS to ostial/proximal LAD   Neuromuscular disorder ([place])    problems in arms. finger tips have been numb/tingling. voltaren works   Obstructive sleep apnea 2018   cpap. has not used lately. uses a breathe-right strip on nose   Seizures ([place])    diabetes seizures (low blood sugar); last one [id number]   Systolic heart failure ([place])    Vitamin D deficiency     Surgical History: Past Surgical History:  Procedure Laterality Date   CARDIAC DEFIBRILLATOR PLACEMENT  08/30/2010   CHOLECYSTECTOMY N/A 11/07/2017   Procedure: LAPAROSCOPIC CHOLECYSTECTOMY;  Surgeon: Herbert Pun, MD;  Location: [facility] ORS;  Service: General;  Laterality: N/A;   CORONARY ANGIOPLASTY  2011   stent placed x 1   CYSTOSCOPY N/A 01/03/2020   Procedure: CYSTOSCOPY FLEXIBLE;  Surgeon: Hollice Espy, MD;  Location: [facility] ORS;  Service: Urology;  Laterality: N/A;   ESOPHAGOGASTRODUODENOSCOPY (EGD) WITH PROPOFOL N/A 03/20/2018   Procedure: ESOPHAGOGASTRODUODENOSCOPY (EGD) WITH PROPOFOL;  Surgeon: Manya Silvas, MD;  Location: [facility] ENDOSCOPY;  Service: Endoscopy;  Laterality: N/A;   LEFT HEART CATH AND CORONARY ANGIOGRAPHY N/A 11/23/2016   Procedure: Left Heart Cath and Coronary Angiography;  Surgeon: Nelva Bush,  MD;  Location: [place] [facility] LAB;  Service: Cardiovascular;  Laterality: N/A;   PROSTATE BIOPSY  2019   PROSTATE BIOPSY N/A 10/23/2019   Procedure: PROSTATE BIOPSY;  Surgeon: Abbie Sons, MD;  Location: [facility] ORS;  Service: Urology;  Laterality: N/A;   ROBOT ASSISTED LAPAROSCOPIC RADICAL PROSTATECTOMY N/A 01/03/2020   Procedure: XI ROBOTIC ASSISTED LAPAROSCOPIC RADICAL PROSTATECTOMY;   Surgeon: Hollice Espy, MD;  Location: [facility] ORS;  Service: Urology;  Laterality: N/A;   TRANSRECTAL ULTRASOUND N/A 10/23/2019   Procedure: TRANSRECTAL ULTRASOUND;  Surgeon: Abbie Sons, MD;  Location: [facility] ORS;  Service: Urology;  Laterality: N/A;   WRIST SURGERY Right 2008    Home Medications:  Allergies as of 11/30/2022       Reactions   Vancomycin Shortness Of Breath   Lisinopril Rash        Medication List        Accurate as of November 30, 2022 12:03 PM. If you have any questions, ask your nurse or doctor.          acetaminophen 500 MG tablet Commonly known as: TYLENOL Take 500 mg by mouth every 6 (six) hours as needed for fever.   albuterol 108 (90 Base) MCG/ACT inhaler Commonly known as: Proventil HFA Inhale 2 puffs into the lungs every 4 (four) hours as needed for wheezing or shortness of breath.   aspirin EC 81 MG tablet Take 81 mg by mouth every morning.   atorvastatin 80 MG tablet Commonly known as: LIPITOR Take 1 tablet (80 mg total) by mouth daily.   B-D ULTRAFINE III SHORT PEN 31G X 8 MM Misc Generic drug: Insulin Pen Needle USE 5 TIMES DAILY AS  DIRECTED   bisoprolol 10 MG tablet Commonly known as: ZEBETA Take 1 tablet (10 mg total) by mouth daily.   Cholecalciferol 25 MCG (1000 UT) capsule Take 1,000 Units by mouth daily.   digoxin 0.125 MG tablet Commonly known as: LANOXIN TAKE 1 TABLET BY MOUTH DAILY   Entresto 97-103 MG Generic drug: sacubitril-valsartan TAKE 1 TABLET BY MOUTH TWICE A DAY. STOP LOSARTAN   fluticasone 50 MCG/ACT nasal spray Commonly known as: FLONASE Place 2 sprays into both nostrils daily as needed for allergies.   glucagon (human recombinant) 1 MG injection Commonly known as: GLUCAGEN Inject into the muscle as needed.   insulin aspart 100 UNIT/ML FlexPen Commonly known as: NOVOLOG Inject 18-20-22 u TID AC plus sliding scale for up to 75 units TDD   isosorbide-hydrALAZINE 20-37.5 MG tablet Commonly known  as: BiDil Take 0.5 tablets by mouth 3 (three) times daily.   Klor-Con M10 10 MEQ tablet Generic drug: potassium chloride TAKE 2 TABLETS BY MOUTH DAILY   loperamide 2 MG capsule Commonly known as: IMODIUM PLEASE [name] ATTACHED FOR DETAILED DIRECTIONS   loratadine 10 MG tablet Commonly known as: CLARITIN Take 10 mg by mouth daily as needed.   naproxen sodium 220 MG tablet Commonly known as: ALEVE Take 220 mg by mouth daily as needed.   spironolactone 25 MG tablet Commonly known as: ALDACTONE Take 1 tablet (25 mg total) by mouth daily.   Symbicort 160-4.5 MCG/ACT inhaler Generic drug: budesonide-formoterol Inhale 2 puffs into the lungs 2 (two) times daily.   tadalafil 20 MG tablet Commonly known as: CIALIS Take 1 tablet (20 mg total) by mouth daily as needed for erectile dysfunction.   torsemide 20 MG tablet Commonly known as: DEMADEX Take 60 mg by mouth daily.   traMADol 50 MG tablet Commonly known  as: ULTRAM Take 50 mg by mouth every 4 (four) hours as needed.   Tyler Aas FlexTouch 200 UNIT/ML FlexTouch Pen Generic drug: insulin degludec Inject 60 Units into the skin daily.        Allergies:  Allergies  Allergen Reactions   Vancomycin Shortness Of Breath   Lisinopril Rash    Family History: Family History  Problem Relation Age of Onset   Hypertension Mother    Heart attack Paternal Grandmother    Heart attack Father    Bladder Cancer Neg Hx    Kidney cancer Neg Hx    Prostate cancer Neg Hx     Social History:  reports that he has never smoked. He has never used smokeless tobacco. He reports that he does not drink alcohol and does not use drugs.   Physical Exam: BP (!) 146/78   Pulse 92   Ht 6' 1"$  (1.854 m)   Wt 216 lb (98 kg)   BMI 28.50 kg/m   Constitutional:  Alert and oriented, No acute distress. HEENT: [place] AT, moist mucus membranes.  Trachea midline, no masses. Neurologic: Grossly intact, no focal deficits, moving all 4  extremities. Psychiatric: Normal mood and affect.  Assessment & Plan:    Prostate cancer - S/p prostatectomy.  - His PSA remains undetectable.  - Continue surveillance with PSA q6 months.  2. Erectile Dysfunction - Severe ED not responsive to medication. Scheduled for IPP surgery in June after a delay due to work commitments. No further prescription for Cialis as it has been ineffective.  Return in about 1 year (around 12/01/2023) for PSA.   (Psa  lab only in 6 mo)  I have reviewed the above documentation for accuracy and completeness, and I agree with the above.   Hollice Espy, MD   [facility] [address], [place], [place] [address] [phone]

## 2022-12-09 ENCOUNTER — Other Ambulatory Visit: Payer: Self-pay | Admitting: *Deleted

## 2022-12-09 DIAGNOSIS — Z006 Encounter for examination for normal comparison and control in clinical research program: Secondary | ICD-10-CM

## 2022-12-09 NOTE — Progress Notes (Signed)
Orders only

## 2022-12-10 ENCOUNTER — Telehealth (HOSPITAL_COMMUNITY): Payer: Self-pay | Admitting: Cardiology

## 2022-12-10 NOTE — Telephone Encounter (Signed)
Triage walk in Pt requested medical records to accompany disability claim ROI completed OV 12/09/21-12/10/22, cpx and echo given to pt

## 2022-12-15 ENCOUNTER — Ambulatory Visit (HOSPITAL_COMMUNITY)
Admission: RE | Admit: 2022-12-15 | Discharge: 2022-12-15 | Disposition: A | Discharge status: Home / Self Care | Payer: BC Managed Care – PPO | Source: Ambulatory Visit | Attending: Cardiology | Admitting: Cardiology

## 2022-12-15 ENCOUNTER — Encounter: Payer: BC Managed Care – PPO | Admitting: *Deleted

## 2022-12-15 ENCOUNTER — Ambulatory Visit (HOSPITAL_COMMUNITY)
Admission: RE | Admit: 2022-12-15 | Discharge: 2022-12-15 | Disposition: A | Discharge status: Home / Self Care | Payer: BC Managed Care – PPO | Source: Ambulatory Visit | Attending: *Deleted | Admitting: *Deleted

## 2022-12-15 VITALS — BP 115/62 | HR 72 | Temp 98.1°F | Resp 18 | Ht 73.0 in | Wt 218.0 lb

## 2022-12-15 DIAGNOSIS — Z006 Encounter for examination for normal comparison and control in clinical research program: Secondary | ICD-10-CM

## 2022-12-15 NOTE — Progress Notes (Signed)
[employer] 035 screen fail please review and sign note  Patient will be seen in [facility] for commercial implant

## 2022-12-15 NOTE — Research (Addendum)
Section A:  Administrative Section  Subject ID: _[id number]_ - _035_ - 015 Subject Initials: _TES_  Date subject signed informed consent  __06_/MAR_/_2024______      (DD /  MMM      / YYYY)  Has the subject been previously screened?    [x]   No     []    Yes                                                                                  Previous Subject ID   __ __ __ __ - __ __ __ - 015       Has the subject been randomized in the BeAT-HF Trial?    [x]   No     []    Yes  Previous Subject ID   __ __ __ __ - __ __ __ - 013  Section B:  Enrollment Criteria  In the investigator's opinion does the subject meet the FDA Indication for Use:  [x]  Yes    []  No (STOP - subject does not qualify for the study)          Has the subject received cardiac resynchronization therapy (CRT) within six months of enrollment, or is actively receiving CRT? []  Yes (STOP - subject does not qualify for the study)   [x]  No  Section C:  Serum Biomarkers   NT-proBNP: __166___ Date:    _06_/MAR_/_2024__                  (DD / MMM / YYYY)  eGFR:  __106__ mL/min/1.7m2 Date:    _06_/_MAR_/_2024______                  (DD / MMM / YYYY)  Negative Pregnancy Test? [x]  N/A Date:    _____/_______/_______                  (DD / MMM / Annamarie Major)   []  Yes     []  No   Section D:  Appropriate Surgical/Study Candidate:   Is the subject able to discontinue the use of antiplatelet drugs (e.g. aspirin) in advance of the procedure, if required? [x]  Yes   []  No  Assessed by:  Durene Cal, MD __ Implanting Physician [x]  Yes Date:    _06_/_MAR_/_2024__                 (DD / MMM / Annamarie Major)   []  No   Assessed by:   ________________________ Vascular / Other Surgeon []  Yes Date:    _____/_______/_______                 (DD / MMM / Annamarie Major)     [x]  No   Section E:  Inclusion/Exclusion Criteria  Does the subject meet all Inclusion Criteria? []  Yes   [x]  No (STOP - subject does not qualify for the study)   Check all that apply   []  Age at  least 21 years and no more than 80 years at the time of enrollment.   [x]  Appropriate candidate for the surgery as determined by an evaluation from the implanting physician using a carotid duplex ultrasound (CDU) [or Computed Tomography Angiography (CTA)  if CDU inconclusive or incomplete] and a review of medical history (including existence of infections that may increase implant risk).  Evaluation must confirm the following within 45 days of the BAROSTIM NEO implant: Appropriate medical condition and medical history for implantation of the BAROSTIM NEO System AND Anatomy that enables this implant procedure, with no vascular structures or orientations or neck anomalies that would be obstructive to the implantation path AND The artery planned for the BAROSTIM NEO implant must have: A carotid bifurcation below the level of the mandible AND No ulcerative carotid arterial plaques AND No carotid atherosclerosis producing a 30% or greater stenosis in linear diameter in the internal carotid AND No carotid atherosclerosis producing a 30% or greater stenosis in linear diameter in the distal common carotid AND Have had no prior surgery, radiation, or endovascular stent placement in the carotid artery or the carotid sinus region AND Able to discontinue the use of antiplatelet drugs (e.g. aspirin) in advance of the procedure, if required   [x]  Six-minute hall walk ( ) >= 150 m AND <= 400 m within 45 days prior to implant.   []  Serum estimated glomerular filtration rate (eGFR) >= 25 mL/min/1.73 m2 using the CKD-EPI method within 45 days prior to the [facility] NEO implant.   []  Body mass index <= 40 kg/m2 within 45 days prior to the [facility] NEO implant.   []  If male and of childbearing potential, must use a medically accepted method of birth control (e.g., barrier method with spermicide, oral contraceptive, or abstinence) and agree to continue use of this method for the duration of the study. Women of  childbearing potential must have a negative pregnancy test within 14 days prior to the [facility] NEO implant.   []  Subjects implanted with a cardiac rhythm management device that does not utilize an intracardiac lead, or implanted with a neurostimulation device, must be approved by the [facility] Clinical department.   []  At the end of screening/baseline, subject still meets the Barostim Neo Indication for Use   []  Signed a [facility]-approved informed consent form for participation in this study.      Does the subject meet any Exclusion Criteria? []  Yes (STOP - subject does not qualify for the study)   [x]  No     List criteria # met: __________________________   []  Received cardiac resynchronization therapy (CRT) within six months of enrollment or is actively receiving CRT.   []  Any of the following contraindications: Baroreflex failure or autonomic neuropathy  Uncontrolled, symptomatic cardiac bradyarrhythmias Known allergy to silicone or titanium   []  Unstable ventricular arrhythmias.   []  Presence of baseline cranial nerve dysfunction at risk from cervical interventions on the carotid bifurcation determined by the Ear, Nose and Throat (ENT) examination.   []  Subjects with any surgery that has occurred, or is planned to occur, within 45 days of the [facility] NEO implant.   []  Recent history (within 6 months of implant) of significant and uncontrolled bleeding.   []  Known and untreated hypercoagulability state.   []  An inappropriate study candidate as evidenced by: Solid organ or hematologic transplant, or currently being evaluated for an organ transplant. Has received or is receiving LVAD therapy or chronic dialysis. Current or planned treatment with intravenous positive inotrope therapy. Primary pulmonary hypertension. Severe COPD or severe restrictive lung disease (e.g. requires chronic oral steroid use or home oxygen use).  Heart failure secondary to a reversible cause, such as cardiac structural  valvular disease, acute myocarditis and pericardial constriction. Clinically significant cardiac  structural valvular disease.  Unable or unwilling to fulfill the protocol medication compliance and follow-up requirements, for reasons including but not limited to an unresolved history of alcohol or substance abuse or psychiatric disorder. Active malignancy.  Any other serious medical condition that may adversely affect the safety of the participant or validity of the study, in the opinion of the investigator. Life expectancy less than one year.   []  Any of the following within 3 months prior to the [facility] NEO implant. Myocardial infarction Unstable angina Coronary intervention (e.g. CABG or PTCA)  Cerebral vascular accident or transient ischemic attack Sudden cardiac death  Surgical cardiac intervention (e.g. cardiac ablation, valve replacement)   []  Enrolled and active in another (e.g. device, pharmaceutical, or biological) clinical study unless approved by the [facility] Clinical department.  Section F:  Adverse Events  Were there any Adverse Events that occurred since consent? []  Yes (complete AE form)   [x]  No  Section H:  Medication Changes   Have there been any changes to the subject's home use medications for Arrhythmia, Antiplatelet/Anticoagulation and Heart failure medications during screening and baseline? []  Yes (update Med form)   [x]  No  Section I:  Signature   Person completing form (Print Name): Mercer Pod :) _____________________  Signature: Mercer Pod :) ____ Date: _03/06/2024_________________________     Section A:  Administrative Section  Subject ID: _[id number]_ - _035_ - 015 Subject Initials: _T_ _E_ _S_  Visit Interval:    [x]   Screening/Baseline    []  Activation   []  0.5 Month       []  1 Month     []  2 Month     []  3 Month       []  6 Month     []  12 Month         []  Unscheduled, reason for visit: _________________________________________  Section B:   Physical Assessment   Date:    _06_/_MAR_/_2024__   (DD / MMM / Annamarie Major)  Weight: __218_  []  kg    [x]  pounds Height (Screening Visit Only): _185_  [x]  cm []  inches  Blood Pressure: _115_  / _62_mmHg Heart Rate: _72_ bpm  Section E:  Signature    Person completing form (Print Name): Mercer Pod :) _______________  Signature: Mercer Pod :) _________ Date: _03/07/2024_____________     SECTION A:  Administrative Section  Patient ID: _[id number]_ - _035_ - 015 Patient Initials: _T_ _E_ _S_  Visit Interval:    [x]   Screening/Baseline*    []  Activation   []  0.5 Month       []  1 Month     []  2 Month     []  3 Month       []  6 Month     []  12 Month         []  Unscheduled, reason for visit: _________________________________________  * For Screening / Baseline only the questions are based on 30 days prior to consent.  SECTION B:  COVID-19 Like Illness Symptoms   Has the subject experienced any cold, flu or COVID-19 symptoms since the last study visit? [x]   No  (skip to section C)     []  Yes, date of onset:  _____/_____ MMM/YYYY    If yes, check all symptoms that apply:   []  Fevers or chills   []   New or Worsening Cough  []  Productive  []  Dry     If yes to cough, indicate severity  []  Constant  []  Occasional, several per  hour   []  New or worsened shortness of breath   []  Diarrhea   []  Altered or reduced sense of smell or taste   []  Muscle aches/Severe Fatigue   []  Chest pain or tightness   []  Sore throat   []  Nausea or vomiting  SECTION C:  COVID-19 Like Illness Testing   Has the patient been tested for COVID-19 since the last study visit? [x]  No     []  Yes, date of test:  _____/_____ MMM/YYYY    If yes, test results:   []  Positive []  Negative []  Unknown  Has the patient been tested for COVID-19 Antibodies since the last study visit? [x]  No     []  Yes, date of test:  _____/_____ MMM/YYYY    If yes, test results:   []  Positive []  Negative []  Unknown  Has the patient been  vaccinated for COVID-19 since the last study visit? [x]  No     []  Yes, date of test:  _____/_____ MMM/YYYY  Has the patient been tested for Influenza ("flu") since the last study visit? [x]  No     []  Yes, date of test:  _____/_____ MMM/YYYY    If yes, test results:   []  Positive []  Negative []  Unknown  Has the patient been vaccinated for Influenza ("flu") since the last study visit? [x]  No     []  Yes, date of test:  _____/_____ MMM/YYYY  SECTION D:  COVID-19 Like Illness Exposure  Has the subject been told that they might have had COVID-19/have symptoms suggestive of COVID-19 since the last study visit? [x]    No   []  Yes   []   Unknown  Has the subject been exposed to anyone with known or suspected COVID-19 since the last study visit? [x]    No   []  Yes   []   Unknown  Has the subject been told that they might have had the "flu" or influenza since the last study visit? [x]    No   []  Yes   []   Unknown  SECTION E:  Effects of COVID Pandemic on Subject's Healthcare Interactions  Since the last study visit, did the subject feel the need to go to an emergency department or hospital for their heart failure but decided not to because of concerns about COVID-19? [x]   No   []  Yes   []  Unknown    If yes, how did they seek care (check all that apply):   []  Telemedicine visit []  In-person []  Clinic []  Urgent Care   []  Subject did not change hospital/ER use due to COVID-19 []  Other, specify: ________________________  Since the last study visit, did the subject have a cardiology/HF related appointment cancelled/rescheduled due to COVID-19 pandemic? [x]   No   []  Yes, how many? ___________   []  Unknown  Since the last study visit, did the subject have any cardiology/HF related telemedicine visit due to COVID-19 pandemic? [x]   No   []  Yes, how many? ___________   []  Unknown  Since the last study visit, did the subject have a cardiology/HF procedure cancelled/rescheduled due to COVID-19 pandemic? [x]    No   []  Yes, how many? ___________   []  Unknown  SECTION F:  Effects of COVID Pandemic on Subject's Medications  Since the last visit, did any of their heart failure medications change or stop, even for a short time?   If yes, enter change into medication eCRF [x]   No   []  Yes, how many? ___________   []  Unknown  If yes, why:   []  Instructed by Doctor []  Self-discontinued []  Unknown []  Other: ________________  Since the last visit, was the subject prescribed any medications for COVID-19? [x]   No   []  Yes   []  Unknown    If yes, what medications (generic name):  SECTION G:  Effects of COVID Pandemic on Subject's Lifestyle  How has the subject's activity/exercise level changed due to COVID-19 pandemic? [x]   No change   []  More activity/exercise   []  Less activity/exercise  How has the subject's smoking habits changed due to COVID-19? [x]   Does not smoke   []  No change   []  Smoke more   []  Smoke less  How has the subject's alcohol drinking habits changed due to COVID-19? [x]   Does not drink   []  No change   []  Drink more   []  Drink less  Section H:  Signature  Person completing form (Print Name): Mercer Pod :) ________________  Signature: Mercer Pod :) _______ Date: _03/06/2024________     Section A:  Administrative Section   Subject ID: _[id number]_ - _035_ - 015  Subject Initials: _T_ _E_ _S_  Section B:  Carotid Duplex Ultrasound (CDU) [x]  Done []  Not Done  Date: _06_/_MAR_/_2024__ (DD / MMM / YYYY)  [x]  Images sent to [facility]  % atherosclerosis in the internal and distal common carotid and duplex measurements:  Right Side Measurements    Both internal and distal common carotids are less than 30% [x]  Yes  []  No (cannot be used for implant)   Comments on determination of <30% stenosis:  Internal Carotid (ICA) []   Normal (no plaque) PSV (peak systolic velocity) __64__ cm/sec   [x]   <=15% (*see CTA) EDV (end diastolic velocity) __26__ cm/sec   []   16-49% (*see  CTA) Linear diameter _53___ mm   []   50-69% Normal spectral waveform  [x]  Yes   []   >=70%  []  No, specify pattern: __________________  Distal Common Carotid (DCC) []   Normal (no plaque) PSV (peak systolic velocity) __96__ cm/sec   [x]   <=15% (*see CTA) EDV (end diastolic velocity) __27__ cm/sec   []   16-49% (*see CTA) Linear diameter ___75___ mm   []   50-69% Normal spectral waveform  [x]  Yes   []   >=70%  []  No, specify pattern: __________________  Left Side Measurements   Both internal and distal common carotids are less than 30% [x]  Yes  []  No (cannot be used for implant)   Comments on determination of <30% stenosis:  Internal Carotid (ICA)  []   Stenosis Normal (no plaque) PSV (peak systolic velocity) ___83___ cm/sec   [x]   <=15% (*see CTA) EDV (end diastolic velocity) ___36___ cm/sec   []   16-49% (*see CTA) Linear diameter ___47___ mm   []   50-69%  Normal spectral waveform  [x]  Yes   []   >=70%   []  No, specify pattern: __________________  Distal Common Carotid [facility])  []   Stenosis Normal (no plaque) PSV (peak systolic velocity) __91___ cm/sec   [x]   <=15% (*see CTA) EDV (end diastolic velocity) __31__ cm/sec   []   16-49% (*see CTA) Linear diameter __65___ mm   []   50-69%  Normal spectral waveform  []  Yes   []   >=70%   []  No, specify pattern: __________________  Name of person reading CDU:  Heath Lark MD   Section C:  Computed Tomography Angiogram (CTA) (if required) []  Done [x]  Not Done  []  CDU was inconclusive (e.g. *stenosis greater than 0%) Date: _____/_______/_______  (DD /  MMM / Annamarie Major)  []  Images sent to [facility]  []  CDU was incomplete/not done    []  CTA requested by [facility]    % atherosclerosis Internal Carotid (ICA) Distal Common Carotid [facility])  Right Side _______% _______%  Left Side _______% _______%  Name of person reading CTA:   Section D:  Physician Assessment  Are there any vascular structures or orientations or neck anomalies that would be obstructive to the  implantation path? []  No [x]  Yes (Specify side):     []  Left  []  Right [x]  Both  Has the subject had prior surgery, radiation, or endovascular stent placement in the carotid artery or the carotid sinus region? [x]  No []  Yes (Specify side):     []  Left  []  Right []  Both  Is at least one carotid bifurcation below the level of the mandible? []  No  [x]  Yes (Specify side):     []  Left  []  Right [x]  Both  Are there any ulcerative carotid arterial plaques? [x]  No  []  Yes (Specify side):     []  Left  []  Right []  Both  Is this subject a candidate for the BATwire procedure? [x]  No  []  Yes (Specify side):     []  Left  []  Right []  Both  Section E:  Comments  Per Dr Myra Gianotti the bifurcation is mid neck but jaw bone is too close to bifurcation     Section F:  Signature   Person completing form (Print Name): __Kimberly Ivory Broad :) ________________  Signature: Mercer Pod :) ___ Date: __03/06/2024____________   Section A:  Administrative Section  Subject ID: _[id number]_ - _035_ - 015 Subject Initials: _T_ _E_ _S_ Visit Interval:  [x]   Baseline     []  6 Month  Section B:  NYHA   NYHA Classification Date:    _06_/_MAR_/_2024_  (DD / MMM / Annamarie Major)  Select One Class Subject Symptoms  []  I No limitations of physical activity, No undue fatigue, palpitation or dyspnea  []  II Slight limitation of physical activity, Comfortable at rest, Less than ordinary activity results in fatigue, Palpitation, or dyspnea  [x]  III Marked limitation of physical activity, Comfortable at rest, Less than ordinary activity results in fatigue, palpitation, or dyspnea  []  IV Unable to carry out any physical activity without discomfort, Symptoms of cardiac insufficiency at rest, Physical activity causes increased discomfort  Name of person conducting NYHA: Mercer Pod :) ___________________  Section C:    Six Minute Hall Walk Test Date:    _06_/_MAR_/_2024______  (DD / MMM / Annamarie Major)  Distance Walked _411_ meters  Were  there any devices used to assist the subject in walking (e.g. cane, walker, etc.)? [x]  No   []  Yes specify:_______________________  Was the walk terminated before 6 minutes? [x]  No   []  Yes specify reason: (select all that apply)    []  Angina    []   Dyspnea    []   Fatigue    []   Dizziness    []   Syncope    []   Other, specify:  Name of person conducting 6-Minute Hall Walk: __Kimberly Ivory Broad :) ________  Section D:  Signature  Person completing form (Print Name): __Kimberly Ivory Broad :) _______________  Signature: Mercer Pod :) ___ Date: __03/06/2024_____________    Section A:  Administrative Section  Subject ID: _[id number]_ - _035_ - 015 Subject Initials: _T_ _E_ _S_ Visit Interval:  [x]   Baseline     []  6 Month     []  12 Month  Section B:  Cardiac Echo   Cardiac Echo Date:  _06_/_MAR_/_2024__ (DD / MMM / Annamarie Major)  [x]  Images sent to [facility]  Heart rate _70__ bpm Echo type: [x]  2D Images Captured: (4-Chamber and Biplane required) [x]  PS-LAX  Blood Pressure _115_/_62_ mmHg  []  3D  [x]  Biplane        [x]  Apical 4-Chamber        []  Other, specify:_______   LV End Diastolic Dimension ___62____________ mm   LV End Systolic Dimension ___50____________ mm   LA Volume ___16____________ mL/m2   LV End-Diastolic Volume ___124____________ mL   LV End-Systolic Volume ___90____________ mL   LV Ejection Fraction ___35____________ %  Section C:  Comments  NA          Section C:  Signature   Person completing form (Print Name): __kimberly Mantaj Chamberlin :) ______________________  Signature: Mercer Pod :) _________ Date: __03/10/2024________________________    Section A.   Administrative Section  Patient ID: _[id number]_ - _035_ - 015 Patient Initials: _T_ _E_ _S_  Exit / Termination Date: __11_/_APR_/_2024_______                                            (DD / MMM / Annamarie Major)  Section B.   Reason for Exit / Termination  [x]  Screening/baseline failure  []  Failed Implant Attempt(s)  []   Completed all Study Required Visits  []  PI Withdrew  []  Subject withdrew consent (add comment below)  []  Subject refuses further follow-up testing (add comment below)  []  Lost to Follow-up  []  System Explanted (Complete Additional Procedure Worksheet)  []  Deceased (Complete Subject Death Worksheet)  []  Other, please specify: ________________________  Comments  HIGH BIFURCATION   >400      Section C. Signature   Person completing form (Print Name): __Kimberly Ivory Broad :) _______  Signature: Mercer Pod :) ___ Date: __04/11/2024___________

## 2022-12-16 ENCOUNTER — Telehealth: Payer: Self-pay | Admitting: Surgery

## 2022-12-16 ENCOUNTER — Ambulatory Visit (HOSPITAL_COMMUNITY): Payer: BC Managed Care – PPO

## 2022-12-16 LAB — ECHOCARDIOGRAM COMPLETE
AR max vel: 2.97 cm2
AV Area VTI: 2.79 cm2
AV Area mean vel: 2.82 cm2
AV Mean grad: 3.5 mmHg
AV Peak grad: 6.6 mmHg
Ao pk vel: 1.28 m/s
Area-P 1/2: 2.93 cm2
Calc EF: 39.2 %
S' Lateral: 5 cm
Single Plane A2C EF: 53.1 %
Single Plane A4C EF: 27.6 %

## 2022-12-16 NOTE — Telephone Encounter (Signed)
-----   Message from Nicholas Lose, RN sent at 12/16/2022  9:51 AM EST ----- Regarding: RE: barostim for [facility]/Appt Forwarding the admin pool, who handles scheduling appointments.  Scheduling- please see below message for appt with [facility]. Thanks ----- Message ----- From: Patton Salles, RN Sent: 12/15/2022   4:32 PM EST To: Kaleen Mask, LPN; Nicholas Lose, RN Subject: barostim for Reggie Pile this patient was seen today by research - he needs to be seen in clinic with [facility] please.  He will go commercial instead of research.  Thanks  Philemon Kingdom :) RN BSN  Clinical Research Nurse  Be strong and take heart, all you who hope in the [place]. ~ Psalm 31:24

## 2022-12-17 LAB — BASIC METABOLIC PANEL
BUN/Creatinine Ratio: 10 (ref 9–20)
BUN: 8 mg/dL (ref 6–24)
CO2: 25 mmol/L (ref 20–29)
Calcium: 9.5 mg/dL (ref 8.7–10.2)
Chloride: 99 mmol/L (ref 96–106)
Creatinine, Ser: 0.82 mg/dL (ref 0.76–1.27)
Glucose: 94 mg/dL (ref 70–99)
Potassium: 4.7 mmol/L (ref 3.5–5.2)
Sodium: 141 mmol/L (ref 134–144)
eGFR: 106 mL/min/{1.73_m2} (ref >59)

## 2022-12-17 LAB — PRO B NATRIURETIC PEPTIDE: NT-Pro BNP: 166 pg/mL — ABNORMAL HIGH (ref 0–121)

## 2022-12-21 NOTE — Telephone Encounter (Signed)
Appt has been scheduled.

## 2023-01-03 ENCOUNTER — Encounter: Payer: Self-pay | Admitting: Surgery

## 2023-01-03 ENCOUNTER — Ambulatory Visit (INDEPENDENT_AMBULATORY_CARE_PROVIDER_SITE_OTHER): Payer: BC Managed Care – PPO | Admitting: Surgery

## 2023-01-03 VITALS — BP 115/72 | HR 77 | Temp 98.7°F | Resp 20 | Ht 73.0 in | Wt 214.0 lb

## 2023-01-03 DIAGNOSIS — I5022 Chronic systolic (congestive) heart failure: Secondary | ICD-10-CM | POA: Diagnosis not present

## 2023-01-03 NOTE — Progress Notes (Signed)
[facility] [place]  Patient name: Joseph [name] MRN: [id number] DOB: 01-Jul-1971 Sex: male   REQUESTING PROVIDER:    Dr. Aundra Dubin   REASON FOR CONSULT:    Barostim evaluation  HISTORY OF PRESENT ILLNESS:   Joseph [name] is a 52 y.o. male, who is referred for Barostim device implant.  The patient suffers from NYHA class II-3 heart failure.  His most recent echo in February 2024 showed an ejection fraction of 35%.  The patient has chronic systolic heart failure with ischemic cardiomyopathy.  He has a Chemical engineer ICD in place.  He has been having treatment since 2011.  He also suffers from moderate restrictive lung disease.  He is on goal-directed medical therapy.  PAST MEDICAL HISTORY    Past Medical History:  Diagnosis Date   AICD (automatic cardioverter/defibrillator) present 2011   Asthma    Cancer ([place])    prostate   CHF (congestive heart failure) ([place])    Coronary artery disease    Diabetes mellitus without complication ([place])    Dyspnea    ED (erectile dysfunction)    GERD (gastroesophageal reflux disease)    Hyperlipidemia    Hypertension    Ischemic cardiomyopathy    LADA (latent autoimmune diabetes in adults), managed as type 1 ([place])    Myocardial infarction ([place]) 2011   anterior MI at [place] s/p BMS to ostial/proximal LAD   Neuromuscular disorder ([place])    problems in arms. finger tips have been numb/tingling. voltaren works   Obstructive sleep apnea 2018   cpap. has not used lately. uses a breathe-right strip on nose   Seizures (H[facility])    diabetes seizures (low blood sugar); last one [id number]   Systolic heart failure ([place])    Vitamin D deficiency      FAMILY HISTORY   Family History  Problem Relation Age of Onset   Hypertension Mother    Heart attack Paternal Grandmother    Heart attack Father    Bladder Cancer Neg Hx    Kidney cancer Neg Hx    Prostate cancer Neg Hx     SOCIAL HISTORY:    Social History   Socioeconomic History   Marital status: Divorced    Spouse name: Not on file   Number of children: Not on file   Years of education: Not on file   Highest education level: Not on file  Occupational History   Occupation: Glass blower/designer  Tobacco Use   Smoking status: Never   Smokeless tobacco: Never  Vaping Use   Vaping Use: Never used  Substance and Sexual Activity   Alcohol use: No   Drug use: No   Sexual activity: Yes  Other Topics Concern   Not on file  Social History Narrative   Not on file   Social Determinants of Health   Financial Resource Strain: Not on file  Food Insecurity: Not on file  Transportation Needs: Not on file  Physical Activity: Not on file  Stress: Not on file  Social Connections: Not on file  Intimate Partner Violence: Not on file    ALLERGIES:    Allergies  Allergen Reactions   Vancomycin Shortness Of Breath   Lisinopril Rash    CURRENT MEDICATIONS:    Current Outpatient Medications  Medication Sig Dispense Refill   acetaminophen (TYLENOL) 500 MG tablet Take 500 mg by mouth every 6 (six) hours as needed for fever.     albuterol (PROVENTIL HFA) 108 (90 Base) MCG/ACT  inhaler Inhale 2 puffs into the lungs every 4 (four) hours as needed for wheezing or shortness of breath. 1 Inhaler 0   aspirin EC 81 MG tablet Take 81 mg by mouth every morning.     atorvastatin (LIPITOR) 80 MG tablet Take 1 tablet (80 mg total) by mouth daily. 90 tablet 3   bisoprolol (ZEBETA) 10 MG tablet Take 1 tablet (10 mg total) by mouth daily. 90 tablet 3   Cholecalciferol 25 MCG (1000 UT) capsule Take 1,000 Units by mouth daily.     digoxin (LANOXIN) 0.125 MG tablet TAKE 1 TABLET BY MOUTH DAILY 90 tablet 3   fluticasone (FLONASE) 50 MCG/ACT nasal spray Place 2 sprays into both nostrils daily as needed for allergies.      glucagon, human recombinant, (GLUCAGEN) 1 MG injection Inject into the muscle as needed.     insulin aspart (NOVOLOG) 100  UNIT/ML FlexPen Inject 18-20-22 u TID AC plus sliding scale for up to 75 units TDD     insulin degludec (TRESIBA FLEXTOUCH) 200 UNIT/ML FlexTouch Pen Inject 60 Units into the skin daily.     Insulin Pen Needle (B-D ULTRAFINE III SHORT PEN) 31G X 8 MM MISC USE 5 TIMES DAILY AS  DIRECTED 75 each 0   isosorbide-hydrALAZINE (BIDIL) 20-37.5 MG tablet Take 0.5 tablets by mouth 3 (three) times daily. 30 tablet 3   loratadine (CLARITIN) 10 MG tablet Take 10 mg by mouth daily as needed.      naproxen sodium (ALEVE) 220 MG tablet Take 220 mg by mouth daily as needed.     potassium chloride (KLOR-CON M10) 10 MEQ tablet TAKE 2 TABLETS BY MOUTH DAILY 180 tablet 3   sacubitril-valsartan (ENTRESTO) 97-103 MG TAKE 1 TABLET BY MOUTH TWICE A DAY. STOP LOSARTAN 60 tablet 6   spironolactone (ALDACTONE) 25 MG tablet Take 1 tablet (25 mg total) by mouth daily. 90 tablet 3   SYMBICORT 160-4.5 MCG/ACT inhaler Inhale 2 puffs into the lungs 2 (two) times daily.     tadalafil (CIALIS) 20 MG tablet Take 1 tablet (20 mg total) by mouth daily as needed for erectile dysfunction. 30 tablet 11   torsemide (DEMADEX) 20 MG tablet Take 60 mg by mouth daily.     traMADol (ULTRAM) 50 MG tablet Take 50 mg by mouth every 4 (four) hours as needed.     No current facility-administered medications for this visit.    REVIEW OF SYSTEMS:   [X]  denotes positive finding, [ ]  denotes negative finding Cardiac  Comments:  Chest pain or chest pressure:    Shortness of breath upon exertion: x   Short of breath when lying flat:    Irregular heart rhythm:        Vascular    Pain in calf, thigh, or hip brought on by ambulation:    Pain in feet at night that wakes you up from your sleep:     Blood clot in your veins:    Leg swelling:  x       Pulmonary    Oxygen at home:    Productive cough:     Wheezing:         Neurologic    Sudden weakness in arms or legs:     Sudden numbness in arms or legs:     Sudden onset of difficulty  speaking or slurred speech:    Temporary loss of vision in one eye:     Problems with dizziness:  Gastrointestinal    Blood in stool:      Vomited blood:         Genitourinary    Burning when urinating:     Blood in urine:        Psychiatric    Major depression:         Hematologic    Bleeding problems:    Problems with blood clotting too easily:        Skin    Rashes or ulcers:        Constitutional    Fever or chills:     PHYSICAL EXAM:   Vitals:   01/03/23 1412  BP: 115/72  Pulse: 77  Resp: 20  Temp: 98.7 F (37.1 C)  SpO2: 93%  Weight: 214 lb (97.1 kg)  Height: 6\' 1"  (1.854 m)    GENERAL: The patient is a well-nourished male, in no acute distress. The vital signs are documented above. CARDIAC: There is a regular rate and rhythm.  VASCULAR: I evaluated his carotid artery on the right with the SonoSite.  His bifurcation is on the high side PULMONARY: Nonlabored respirations MUSCULOSKELETAL: There are no major deformities or cyanosis. NEUROLOGIC: No focal weakness or paresthesias are detected. SKIN: There are no ulcers or rashes noted. PSYCHIATRIC: The patient has a normal affect.  STUDIES:   I have reviewed the following carotid ultrasound: Right Carotid: Velocities in the right ICA are consistent with a 1-39%  stenosis.   Left Carotid: The extracranial vessels were near-normal with only minimal  wall               thickening or plaque.   Vertebrals:  Bilateral vertebral arteries demonstrate antegrade flow.  Subclavians: Normal flow hemodynamics were seen in bilateral subclavian               arteries.   ASSESSMENT and PLAN   NYHA class III heart failure: The patient is referred for evaluation for Barostim Neo device implant.  He is on goal-directed medical therapy and still having symptoms.  I think he would be an excellent candidate.  He was not a candidate for [facility] wire because of his high carotid bifurcation.  I discussed the details of  the operation as well as the risks and benefits.  All questions were answered.  I will work on Biochemist, clinical and get him scheduled once this has been achieved.   Leia Alf, MD, FACS [facility] [phone] Pager [phone]

## 2023-01-05 ENCOUNTER — Other Ambulatory Visit (HOSPITAL_COMMUNITY): Payer: BC Managed Care – PPO

## 2023-01-19 ENCOUNTER — Encounter: Payer: Self-pay | Admitting: Cardiology

## 2023-01-19 ENCOUNTER — Ambulatory Visit (HOSPITAL_BASED_OUTPATIENT_CLINIC_OR_DEPARTMENT_OTHER): Payer: BC Managed Care – PPO | Admitting: Cardiology

## 2023-01-19 ENCOUNTER — Other Ambulatory Visit
Admission: RE | Admit: 2023-01-19 | Discharge: 2023-01-19 | Disposition: A | Discharge status: Home / Self Care | Payer: BC Managed Care – PPO | Source: Ambulatory Visit | Attending: Cardiology | Admitting: Cardiology

## 2023-01-19 VITALS — BP 120/60 | HR 80 | Wt 215.0 lb

## 2023-01-19 DIAGNOSIS — I5022 Chronic systolic (congestive) heart failure: Secondary | ICD-10-CM | POA: Insufficient documentation

## 2023-01-19 DIAGNOSIS — I509 Heart failure, unspecified: Secondary | ICD-10-CM | POA: Diagnosis not present

## 2023-01-19 LAB — BASIC METABOLIC PANEL
Anion gap: 7 (ref 5–15)
BUN: 23 mg/dL — ABNORMAL HIGH (ref 6–20)
CO2: 28 mmol/L (ref 22–32)
Calcium: 9.1 mg/dL (ref 8.9–10.3)
Chloride: 101 mmol/L (ref 98–111)
Creatinine, Ser: 1.42 mg/dL — ABNORMAL HIGH (ref 0.61–1.24)
GFR, Estimated: 59 mL/min — ABNORMAL LOW (ref >60)
Glucose, Bld: 214 mg/dL — ABNORMAL HIGH (ref 70–99)
Potassium: 4.1 mmol/L (ref 3.5–5.1)
Sodium: 136 mmol/L (ref 135–145)

## 2023-01-19 LAB — DIGOXIN LEVEL: Digoxin Level: 0.4 ng/mL — ABNORMAL LOW (ref 0.8–2.0)

## 2023-01-19 NOTE — Patient Instructions (Signed)
  You have lab work today.  Labs done today, your results will be available in MyChart, we will contact you for abnormal readings.   You have follow up appointment with Dr. Shirlee Latch in 2 months . Please call the beginning of June to schedule this appointment.

## 2023-01-20 LAB — LIPOPROTEIN A (LPA): Lipoprotein (a): 63.3 nmol/L — ABNORMAL HIGH (ref <75.0)

## 2023-01-20 NOTE — Progress Notes (Signed)
Advanced Heart Failure Clinic Progress Note    PCP: Dr. Marcello Fennel Cardiology: Dr. Darrold Junker HF Cardiology: Dr. Shirlee Latch  52 y.o. with history of type 1 DM, HTN, CAD, chronic systolic CHF, severe asthma, OSA not adherent with CPAP.  Patient had anterior STEMI with DES to proximal LAD in 6/11.  Echo at the time with EF < 35%.  Patient eventually had [employer] ICD placed.  LV systolic function has been low since 6/11 MI.  He presented again in 2/18 with chest pain and dyspnea as well as anterior STE on ECG, but cath showed only moderate proximal LAD in-stent restenosis, not explaining his ECG changes.  Most recent echo in 1/23 showed EF 25-30%, normal RV, mild-moderate MR.   CPX 5/23>>Submaximal test. Moderate to severe functional limitation due to a combination of HF and moderate to severe restrictive lung physiology due to patient's body habitus. Chronotropic incompetence was noted. Suspect lung limitation predominates.   Diagnosed w/ COVID 8/23 but did not require hospitalization. Patient had COVID-19 again in 1/24.    Echo in 2/24 showed EF 35%, septal/apical akinesis, normal RV.   Echo in 3/24 as part of barostimulation activation therapy workup showed EF 30-35%, wall motion abnormalities consistent with LAD-territory MI, mild LV dilation, normal RV, normal IVC.   He returns for followup of CHF.  Weight is down 1 lb.  He has occasional orthopnea when he first lies down at night, resolves quickly. He is short of breath with heavy lifting.  No dyspnea walking on flat ground.  No chest pain.  No lightheadedness.  He has had a chronic cough x years.  He does wheeze at times and has history of asthma. He had to stop Bidil due to lightheadedness/soft BP.   Has not been following with pulmonary for his asthma or a provider for his CPAP. Has not used CPAP in over a year.  Labs (12/23): K 5.4, creatinine 1.48, glucose 508 (labs drawn in setting of marked hyperglycemia).  Labs (1/24): K 3.7, creatinine  1.28, LDL 65 Labs (3/24): K 4.7, creatinine 0.82, pro-BNP 166  PMH: 1. Type 1 diabetes: Diagnosis in 1997. H/o DKA.  2. GERD 3. Hyperlipidemia 4. HTN 5. OSA: Not adherent with CPAP 6. H/o seizure 7. CAD: 6/11 anterior MI with PCI to LAD.  - 2/18 STEMI by ECG => cath with moderate in-stent restenosis in the ostial/proximal LAD stent but did not explain ECG changes (?recanalized).   8. Chronic systolic CHF: Ischemic cardiomyopathy. [employer] ICD.  - Echo (6/11): EF < 35%.  - Echo (2017): EF 40% - Echo (2/18): EF 25-30% - Echo (1/23): EF 25-30%, normal RV, mild-moderate MR.  - CPX (5/23): VE/VCO2 slope 31, VO2 15.8, RER 1.0.  Submaximal. Probably mild HF limitation with further limitation due to deconditioning and moderate restriction by spirometry.  - Echo (2/24): EF 35%, septal/apical akinesis, normal RV.   - Echo (3/24): EF 30-35%, wall motion abnormalities consistent with LAD-territory MI, mild LV dilation, normal RV, normal IVC.  9. Severe asthma - Previously seen by Dr. Karna Christmas in 2021  Social History   Socioeconomic History   Marital status: Divorced    Spouse name: Not on file   Number of children: Not on file   Years of education: Not on file   Highest education level: Not on file  Occupational History   Occupation: Location manager  Tobacco Use   Smoking status: Never   Smokeless tobacco: Never  Vaping Use   Vaping Use: Never used  Substance and Sexual Activity   Alcohol use: No   Drug use: No   Sexual activity: Yes  Other Topics Concern   Not on file  Social History Narrative   Not on file   Social Determinants of Health   Financial Resource Strain: Not on file  Food Insecurity: Not on file  Transportation Needs: Not on file  Physical Activity: Not on file  Stress: Not on file  Social Connections: Not on file  Intimate Partner Violence: Not on file   Family History  Problem Relation Age of Onset   Hypertension Mother    Heart attack  Paternal Grandmother    Heart attack Father    Bladder Cancer Neg Hx    Kidney cancer Neg Hx    Prostate cancer Neg Hx    ROS: all systems reviewed and negative except as per HPI.   Current Outpatient Medications  Medication Sig Dispense Refill   acetaminophen (TYLENOL) 500 MG tablet Take 500 mg by mouth every 6 (six) hours as needed for fever.     albuterol (PROVENTIL HFA) 108 (90 Base) MCG/ACT inhaler Inhale 2 puffs into the lungs every 4 (four) hours as needed for wheezing or shortness of breath. 1 Inhaler 0   aspirin EC 81 MG tablet Take 81 mg by mouth every morning.     atorvastatin (LIPITOR) 80 MG tablet Take 1 tablet (80 mg total) by mouth daily. 90 tablet 3   bisoprolol (ZEBETA) 10 MG tablet Take 1 tablet (10 mg total) by mouth daily. 90 tablet 3   Cholecalciferol 25 MCG (1000 UT) capsule Take 1,000 Units by mouth daily.     digoxin (LANOXIN) 0.125 MG tablet TAKE 1 TABLET BY MOUTH DAILY 90 tablet 3   fluticasone (FLONASE) 50 MCG/ACT nasal spray Place 2 sprays into both nostrils daily as needed for allergies.      glucagon, human recombinant, (GLUCAGEN) 1 MG injection Inject into the muscle as needed.     insulin aspart (NOVOLOG) 100 UNIT/ML FlexPen Inject 18-20-22 u TID AC plus sliding scale for up to 75 units TDD     insulin degludec (TRESIBA FLEXTOUCH) 200 UNIT/ML FlexTouch Pen Inject 60 Units into the skin daily.     Insulin Pen Needle (B-D ULTRAFINE III SHORT PEN) 31G X 8 MM MISC USE 5 TIMES DAILY AS  DIRECTED 75 each 0   isosorbide-hydrALAZINE (BIDIL) 20-37.5 MG tablet Take 0.5 tablets by mouth 3 (three) times daily. 30 tablet 3   loratadine (CLARITIN) 10 MG tablet Take 10 mg by mouth daily as needed.      naproxen sodium (ALEVE) 220 MG tablet Take 220 mg by mouth daily as needed.     potassium chloride (KLOR-CON M10) 10 MEQ tablet TAKE 2 TABLETS BY MOUTH DAILY 180 tablet 3   sacubitril-valsartan (ENTRESTO) 97-103 MG TAKE 1 TABLET BY MOUTH TWICE A DAY. STOP LOSARTAN 60 tablet 6    spironolactone (ALDACTONE) 25 MG tablet Take 1 tablet (25 mg total) by mouth daily. 90 tablet 3   SYMBICORT 160-4.5 MCG/ACT inhaler Inhale 2 puffs into the lungs 2 (two) times daily.     tadalafil (CIALIS) 20 MG tablet Take 1 tablet (20 mg total) by mouth daily as needed for erectile dysfunction. 30 tablet 11   torsemide (DEMADEX) 20 MG tablet Take 60 mg by mouth daily.     traMADol (ULTRAM) 50 MG tablet Take 50 mg by mouth every 4 (four) hours as needed.     No current facility-administered medications  for this visit.   BP 120/60   Pulse 80   Wt 215 lb (97.5 kg)   SpO2 98%   BMI 28.37 kg/m  PHYSICAL EXAM: General: NAD Neck: No JVD, no thyromegaly or thyroid nodule.  Lungs: Clear to auscultation bilaterally with normal respiratory effort. CV: Nondisplaced PMI.  Heart regular S1/S2, no S3/S4, no murmur.  No peripheral edema.  No carotid bruit.  Normal pedal pulses.  Abdomen: Soft, nontender, no hepatosplenomegaly, no distention.  Skin: Intact without lesions or rashes.  Neurologic: Alert and oriented x 3.  Psych: Normal affect. Extremities: No clubbing or cyanosis.  HEENT: Normal.   Assessment/Plan: 1. CAD: S/p anterior MI in 2011. Denies chest pain  - Continue ASA 81 daily.  - Atorvastatin 80 mg, good lipids in 1/24.  - I will check lipoprotein(a).  2. Chronic systolic CHF: Ischemic cardiomyopathy with [employer] ICD.  EF has been low since 2011.  Most recent echo in 2/24 with EF 35%, normal RV. CPX 5/23 was a submaximal, mild HF limitation but additional function limitation due to deconditioning and moderate restrictive lung disease by PFTs.  NYHA class II, not volume overloaded on exam. .  - Continue torsemide 60 mg daily. BMET/BNP today. - Continue bisoprolol 10 mg daily.  - Continue spironolactone 25 mg daily.  - Continue digoxin, check level today.  - Continue Entresto 97/103 bid.   - No SGLT2 inhibitor due to type 1 diabetes.   - He was unable to tolerate low  dose Bidil (0.5 tab tid) due to orthostatic symptoms. - He is interested in baroreceptor activation therapy.  We are waiting to hear about insurance approval for this, will proceed if he can get coverage.  3. Type 1 diabetes: Following with Endocrine - Has not been well-controlled 4. Asthma:  Severe per prior notes.  Not actively wheezing.  - Needs followup with pulmonology, has appointment.   5. OSA: Needs to get back on CPAP, has pulmonary appt.     F/u 2 months   Marca Ancona,  01/20/2023

## 2023-01-20 NOTE — Research (Addendum)
[facility]  Informed Consent   Subject Name: Joseph [name]  Subject met inclusion and exclusion criteria.  The informed consent form, study requirements and expectations were reviewed with the subject and questions and concerns were addressed prior to the signing of the consent form.  The subject verbalized understanding of the trial requirements.  The subject agreed to participate in the [facility]  trial and signed the informed consent on 12/15/2022 .  The informed consent was obtained prior to performance of any protocol-specific procedures for the subject.  A copy of the signed informed consent was given to the subject and a copy was placed in the subject's medical record.   Mercer Pod D

## 2023-01-26 ENCOUNTER — Encounter: Payer: Self-pay | Admitting: *Deleted

## 2023-02-07 ENCOUNTER — Telehealth: Payer: Self-pay

## 2023-02-07 ENCOUNTER — Other Ambulatory Visit: Payer: Self-pay

## 2023-02-07 DIAGNOSIS — Z1211 Encounter for screening for malignant neoplasm of colon: Secondary | ICD-10-CM

## 2023-02-07 MED ORDER — NA SULFATE-K SULFATE-MG SULF 17.5-3.13-1.6 GM/177ML PO SOLN: 1.0000 | Freq: Once | ORAL | 0 refills | Status: AC

## 2023-02-07 NOTE — Telephone Encounter (Signed)
Gastroenterology Pre-Procedure Review  Request Date: 03/04/23 Requesting Physician: Dr. Tobi Bastos  PATIENT REVIEW QUESTIONS: The patient responded to the following health history questions as indicated:    1. Are you having any GI issues? no 2. Do you have a personal history of Polyps? no 3. Do you have a family history of Colon Cancer or Polyps? no 4. Diabetes Mellitus? yes (takes insulin has been advised to take half usual dose of insulin the evening before colonoscopy) 5. Joint replacements in the past 12 months?no 6. Major health problems in the past 3 months?no 7. Any artificial heart valves, MVP, or defibrillator?no    MEDICATIONS & ALLERGIES:    Patient reports the following regarding taking any anticoagulation/antiplatelet therapy:   Plavix, Coumadin, Eliquis, Xarelto, Lovenox, Pradaxa, Brilinta, or Effient? no Aspirin? yes (81 mg daily)  Patient confirms/reports the following medications:  Current Outpatient Medications  Medication Sig Dispense Refill   acetaminophen (TYLENOL) 500 MG tablet Take 500 mg by mouth every 6 (six) hours as needed for fever.     albuterol (PROVENTIL HFA) 108 (90 Base) MCG/ACT inhaler Inhale 2 puffs into the lungs every 4 (four) hours as needed for wheezing or shortness of breath. 1 Inhaler 0   aspirin EC 81 MG tablet Take 81 mg by mouth every morning.     atorvastatin (LIPITOR) 80 MG tablet Take 1 tablet (80 mg total) by mouth daily. 90 tablet 3   bisoprolol (ZEBETA) 10 MG tablet Take 1 tablet (10 mg total) by mouth daily. 90 tablet 3   Cholecalciferol 25 MCG (1000 UT) capsule Take 1,000 Units by mouth daily.     digoxin (LANOXIN) 0.125 MG tablet TAKE 1 TABLET BY MOUTH DAILY 90 tablet 3   fluticasone (FLONASE) 50 MCG/ACT nasal spray Place 2 sprays into both nostrils daily as needed for allergies.      glucagon, human recombinant, (GLUCAGEN) 1 MG injection Inject into the muscle as needed.     insulin aspart (NOVOLOG) 100 UNIT/ML FlexPen Inject 18-20-22 u  TID AC plus sliding scale for up to 75 units TDD     insulin degludec (TRESIBA FLEXTOUCH) 200 UNIT/ML FlexTouch Pen Inject 60 Units into the skin daily.     Insulin Pen Needle (B-D ULTRAFINE III SHORT PEN) 31G X 8 MM MISC USE 5 TIMES DAILY AS  DIRECTED 75 each 0   isosorbide-hydrALAZINE (BIDIL) 20-37.5 MG tablet Take 0.5 tablets by mouth 3 (three) times daily. 30 tablet 3   loratadine (CLARITIN) 10 MG tablet Take 10 mg by mouth daily as needed.      naproxen sodium (ALEVE) 220 MG tablet Take 220 mg by mouth daily as needed.     potassium chloride (KLOR-CON M10) 10 MEQ tablet TAKE 2 TABLETS BY MOUTH DAILY 180 tablet 3   sacubitril-valsartan (ENTRESTO) 97-103 MG TAKE 1 TABLET BY MOUTH TWICE A DAY. STOP LOSARTAN 60 tablet 6   spironolactone (ALDACTONE) 25 MG tablet Take 1 tablet (25 mg total) by mouth daily. 90 tablet 3   SYMBICORT 160-4.5 MCG/ACT inhaler Inhale 2 puffs into the lungs 2 (two) times daily.     tadalafil (CIALIS) 20 MG tablet Take 1 tablet (20 mg total) by mouth daily as needed for erectile dysfunction. 30 tablet 11   torsemide (DEMADEX) 20 MG tablet Take 60 mg by mouth daily.     traMADol (ULTRAM) 50 MG tablet Take 50 mg by mouth every 4 (four) hours as needed.     No current facility-administered medications for this visit.  Patient confirms/reports the following allergies:  Allergies  Allergen Reactions   Vancomycin Shortness Of Breath   Lisinopril Rash    No orders of the defined types were placed in this encounter.   AUTHORIZATION INFORMATION Primary Insurance: 1D#: Group #:  Secondary Insurance: 1D#: Group #:  SCHEDULE INFORMATION: Date: 03/04/23 Time: Location: [facility]

## 2023-02-07 NOTE — Telephone Encounter (Signed)
Pt returning your call to schedule his colonoscopy.

## 2023-02-08 ENCOUNTER — Telehealth: Payer: Self-pay

## 2023-02-08 NOTE — Telephone Encounter (Signed)
-----   Message from Gaston Islam., NP sent at 02/07/2023  3:21 PM EDT ----- Regarding: FW: Clinical  ----- Message ----- From: Avie Arenas, CMA Sent: 02/07/2023   3:04 PM EDT To: Cv Div Preop Subject: Clinical

## 2023-02-08 NOTE — Telephone Encounter (Signed)
   Patient Name: Joseph [name]  DOB: [birth year] MRN: [id number]  Primary Cardiologist: None  Chart reviewed as part of pre-operative protocol coverage. Given past medical history and time since last visit, based on ACC/AHA guidelines, Joseph [name] is at acceptable risk for the planned procedure without further cardiovascular testing.  Per Dr.McLean patient should have colonoscopy completed in a hospital setting and not in a ambulatory surgical center.  The patient was advised that if he develops new symptoms prior to surgery to contact our office to arrange for a follow-up visit, and he verbalized understanding.  Patient can hold ASA 81 mg 3 days prior to procedure and should restart once surgically safe postprocedure.  I will route this recommendation to the requesting party via Epic fax function and remove from pre-op pool.  Please call with questions.  Napoleon Form, Leodis Rains, NP 02/08/2023, 11:14 AM

## 2023-02-08 NOTE — Telephone Encounter (Signed)
   Pre-operative Risk Assessment    Patient Name: Joseph [name]  DOB: 11/01/1970 MRN: [id number]      Request for Surgical Clearance    Procedure:   Colonoscopy  Date of Surgery:  Clearance 03/04/23                                 Surgeon:  Dr Wyline Mood  Surgeon's Group or Practice Name:  [facility] Phone number:  [phone],  [facility] number:  [phone]    Type of Clearance Requested:   - Medical  - Pharmacy:  Hold Aspirin     Type of Anesthesia:  General    Additional requests/questions:   N/A  [name]   02/08/2023, 8:54 AM

## 2023-02-08 NOTE — Telephone Encounter (Signed)
Good morning Dr.McLean,  You received a surgical clearance request for Joseph [name] to have a colonoscopy completed on 03/04/2023.  He was seen most recently in clinic by you on 01/19/2023.  He has a past medical history of anterior STEMI in 03/2010 treated with DES to proximal LAD and HFrEF with ICD placed post MI, HTN, severe asthma, sleep apnea.  Could you please offer your recommendation on holding ASA prior to procedure and if he would be at acceptable risk to proceed with scheduled colonoscopy?  Please forward your recommendations to P CV DIV PREOP.  Thank you very much for your time and looking forward to your recommendations.  Robin Searing, NP

## 2023-02-10 ENCOUNTER — Encounter: Payer: Self-pay | Admitting: Pulmonary Disease

## 2023-02-10 ENCOUNTER — Ambulatory Visit: Payer: BC Managed Care – PPO | Admitting: Pulmonary Disease

## 2023-02-10 ENCOUNTER — Telehealth: Payer: Self-pay

## 2023-02-10 VITALS — BP 110/70 | HR 78 | Temp 97.9°F | Ht 73.0 in | Wt 214.8 lb

## 2023-02-10 DIAGNOSIS — J4550 Severe persistent asthma, uncomplicated: Secondary | ICD-10-CM | POA: Diagnosis not present

## 2023-02-10 DIAGNOSIS — G4733 Obstructive sleep apnea (adult) (pediatric): Secondary | ICD-10-CM | POA: Diagnosis not present

## 2023-02-10 DIAGNOSIS — J301 Allergic rhinitis due to pollen: Secondary | ICD-10-CM

## 2023-02-10 LAB — POCT EXHALED NITRIC OXIDE: FeNO level (ppb): 76

## 2023-02-10 NOTE — Patient Instructions (Signed)
Use symbicort two puffs in the morning and two puffs in the evening, and rinse your mouth after each use.  Albuterol two puffs every 6 hours as needed for cough, wheeze, chest congestion, or shortness of breath.  Will arrange for a home sleep study.  Follow up in 6 weeks.

## 2023-02-10 NOTE — Progress Notes (Signed)
[facility]  Chief Complaint  Patient presents with   Consult    Past Surgical History:  He  has a past surgical history that includes Cardiac defibrillator placement (08/30/2010); LEFT HEART CATH AND CORONARY ANGIOGRAPHY (N/A, 11/23/2016); Esophagogastroduodenoscopy (egd) with propofol (N/A, 03/20/2018); Coronary angioplasty (2011); Cholecystectomy (N/A, 11/07/2017); Prostate biopsy (2019); Prostate biopsy (N/A, 10/23/2019); Transrectal ultrasound (N/A, 10/23/2019); Wrist surgery (Right, 2008); Robot assisted laparoscopic radical prostatectomy (N/A, 01/03/2020); and Cystoscopy (N/A, 01/03/2020).  Past Medical History:  Prostate cancer, CHF, CAD, DM type 2, ED, GERD, HLD, HTN, Seizures, Neuropathy  Constitutional:  BP 110/70 (BP Location: Left Arm, Patient Position: Sitting, Cuff Size: Normal)   Pulse 78   Temp 97.9 F (36.6 C) (Oral)   Ht 6\' 1"  (1.854 m)   Wt 214 lb 12.8 oz (97.4 kg)   SpO2 98%   BMI 28.34 kg/m   Brief Summary:  Joseph [name] is a 52 y.o. male with asthma and obstructive sleep apnea.      Subjective:   He was seen previously by Dr. Karna Christmas and Dr. Sung Amabile.  He has persistent cough with clear sputum.  He gets sinus congestion with post nasal drip.  These are worse in the morning.  He has only been using symbicort in the morning.  He didn't realize he needed to use this twice per day.  He uses albuterol 3 to 5 times per day.  This helps, but doesn't last.  He gets episodes of wheezing.  He doesn't smoke cigarettes.  He had influenza and COVID pneumonia before.  Chest xray from 11/08/22 was unremarkable.  His sleep study from 2018 showed moderate sleep apnea.  He had CPAP, but mask broke and he wasn't able to get replacement supplies.  It has been about 3 years since he last used CPAP.  He felt that CPAP helped when he was able to use this.  He has snoring and stops breathing at night.  He gets sleepy during the day and falls  asleep sometimes when watching TV.  Physical Exam:   Appearance - well kempt   ENMT - no sinus tenderness, no oral exudate, no LAN, Mallampati 3 airway, no stridor  Respiratory - equal breath sounds bilaterally, no wheezing or rales  CV - s1s2 regular rate and rhythm, no murmurs  Ext - no clubbing, no edema  Skin - no rashes  Psych - normal mood and affect   Pulmonary testing:  RAST 11/27/19>> dust mite, grasses, mold, tree bark, ragweed; IgE 401 HP panel 11/27/19 >> negative PFT 02/06/20 >> FEV1 1.92 (55%), FEV1% 65, TLC 57%, DLCO 61% FeNO 02/10/23 >> 76  Chest Imaging:  CT chest 05/08/21 >> lungs clear  Sleep Tests:  HST 11/30/16 >> AHI 19, SpO2 low 88%  Cardiac Tests:  CPET 02/17/22 >> limitation due to CHF and obesity Echo 12/15/22 >> EF 30 to 35%, grade 2 DD, mild MR  Social History:  He  reports that he has never smoked. He has never used smokeless tobacco. He reports that he does not drink alcohol and does not use drugs.  Family History:  His family history includes Heart attack in his father and paternal grandmother; Hypertension in his mother.     Assessment/Plan:   Severe, persistent asthma with allergic phenotype. - advised him he needs to use symbicort twice per day to get maximum benefit - continue albuterol prn - if symptoms persist, then consider adding a LAMA and/or leukotriene inhibitor  Obstructive  sleep apnea. - he has persistent snoring, sleep disruption, apnea, and daytime sleepiness - he likely still has significant obstructive sleep apnea - will arrange for a home sleep study to assess current status  CAD, HFrEF. - followed by Dr. Marca Ancona with heart failure team  Time Spent Involved in Patient Care on Day of Examination:  52 minutes  Follow up:   Patient Instructions  Use symbicort two puffs in the morning and two puffs in the evening, and rinse your mouth after each use.  Albuterol two puffs every 6 hours as needed for cough,  wheeze, chest congestion, or shortness of breath.  Will arrange for a home sleep study.  Follow up in 6 weeks.  Medication List:   Allergies as of 02/10/2023       Reactions   Vancomycin Shortness Of Breath   Lisinopril Rash        Medication List        Accurate as of Feb 10, 2023 12:00 PM. If you have any questions, ask your nurse or doctor.          acetaminophen 500 MG tablet Commonly known as: TYLENOL Take 500 mg by mouth every 6 (six) hours as needed for fever.   albuterol 108 (90 Base) MCG/ACT inhaler Commonly known as: Proventil HFA Inhale 2 puffs into the lungs every 4 (four) hours as needed for wheezing or shortness of breath.   aspirin EC 81 MG tablet Take 81 mg by mouth every morning.   atorvastatin 80 MG tablet Commonly known as: LIPITOR Take 1 tablet (80 mg total) by mouth daily.   B-D ULTRAFINE III SHORT PEN 31G X 8 MM Misc Generic drug: Insulin Pen Needle USE 5 TIMES DAILY AS  DIRECTED   bisoprolol 10 MG tablet Commonly known as: ZEBETA Take 1 tablet (10 mg total) by mouth daily.   Cholecalciferol 25 MCG (1000 UT) capsule Take 1,000 Units by mouth daily.   digoxin 0.125 MG tablet Commonly known as: LANOXIN TAKE 1 TABLET BY MOUTH DAILY   Entresto 97-103 MG Generic drug: sacubitril-valsartan TAKE 1 TABLET BY MOUTH TWICE A DAY. STOP LOSARTAN   fluticasone 50 MCG/ACT nasal spray Commonly known as: FLONASE Place 2 sprays into both nostrils daily as needed for allergies.   glucagon (human recombinant) 1 MG injection Commonly known as: GLUCAGEN Inject into the muscle as needed.   insulin aspart 100 UNIT/ML FlexPen Commonly known as: NOVOLOG Inject 18-20-22 u TID AC plus sliding scale for up to 75 units TDD   isosorbide-hydrALAZINE 20-37.5 MG tablet Commonly known as: BiDil Take 0.5 tablets by mouth 3 (three) times daily.   Klor-Con M10 10 MEQ tablet Generic drug: potassium chloride TAKE 2 TABLETS BY MOUTH DAILY   loratadine 10 MG  tablet Commonly known as: CLARITIN Take 10 mg by mouth daily as needed.   naproxen sodium 220 MG tablet Commonly known as: ALEVE Take 220 mg by mouth daily as needed.   spironolactone 25 MG tablet Commonly known as: ALDACTONE Take 1 tablet (25 mg total) by mouth daily.   Symbicort 160-4.5 MCG/ACT inhaler Generic drug: budesonide-formoterol Inhale 2 puffs into the lungs 2 (two) times daily.   tadalafil 20 MG tablet Commonly known as: CIALIS Take 1 tablet (20 mg total) by mouth daily as needed for erectile dysfunction.   torsemide 20 MG tablet Commonly known as: DEMADEX Take 60 mg by mouth daily.   traMADol 50 MG tablet Commonly known as: ULTRAM Take 50 mg by mouth every  4 (four) hours as needed.   Evaristo Bury FlexTouch 200 UNIT/ML FlexTouch Pen Generic drug: insulin degludec Inject 60 Units into the skin daily.        Signature:  Coralyn Helling, MD [facility] Pager - [phone] 02/10/2023, 12:00 PM

## 2023-02-10 NOTE — Telephone Encounter (Signed)
Cardiac Clearance has been granted for patients 03/04/23 colonoscopy.   Please see Cardiology note dated 02/08/23.  Thanks,  [place], [place]

## 2023-02-10 NOTE — Progress Notes (Signed)
Covid vaccines 2021 first 2 [place].  One booster

## 2023-02-11 ENCOUNTER — Other Ambulatory Visit (HOSPITAL_COMMUNITY): Payer: Self-pay | Admitting: Cardiology

## 2023-02-14 DIAGNOSIS — N5231 Erectile dysfunction following radical prostatectomy: Secondary | ICD-10-CM | POA: Diagnosis not present

## 2023-02-14 DIAGNOSIS — I5022 Chronic systolic (congestive) heart failure: Secondary | ICD-10-CM | POA: Diagnosis not present

## 2023-02-14 DIAGNOSIS — I2510 Atherosclerotic heart disease of native coronary artery without angina pectoris: Secondary | ICD-10-CM | POA: Diagnosis not present

## 2023-02-14 DIAGNOSIS — Z01818 Encounter for other preprocedural examination: Secondary | ICD-10-CM | POA: Diagnosis not present

## 2023-02-14 DIAGNOSIS — J4550 Severe persistent asthma, uncomplicated: Secondary | ICD-10-CM | POA: Diagnosis not present

## 2023-02-14 DIAGNOSIS — E109 Type 1 diabetes mellitus without complications: Secondary | ICD-10-CM | POA: Diagnosis not present

## 2023-03-03 ENCOUNTER — Encounter: Payer: Self-pay | Admitting: Gastroenterology

## 2023-03-03 DIAGNOSIS — R208 Other disturbances of skin sensation: Secondary | ICD-10-CM | POA: Diagnosis not present

## 2023-03-03 DIAGNOSIS — L728 Other follicular cysts of the skin and subcutaneous tissue: Secondary | ICD-10-CM | POA: Diagnosis not present

## 2023-03-03 DIAGNOSIS — L638 Other alopecia areata: Secondary | ICD-10-CM | POA: Diagnosis not present

## 2023-03-03 DIAGNOSIS — L538 Other specified erythematous conditions: Secondary | ICD-10-CM | POA: Diagnosis not present

## 2023-03-04 ENCOUNTER — Encounter: Payer: Self-pay | Admitting: Gastroenterology

## 2023-03-04 ENCOUNTER — Ambulatory Visit: Payer: BC Managed Care – PPO | Admitting: Anesthesiology

## 2023-03-04 ENCOUNTER — Ambulatory Visit
Admission: RE | Admit: 2023-03-04 | Discharge: 2023-03-04 | Disposition: A | Discharge status: Home / Self Care | Payer: BC Managed Care – PPO | Source: Ambulatory Visit | Attending: Gastroenterology | Admitting: Gastroenterology

## 2023-03-04 ENCOUNTER — Other Ambulatory Visit: Payer: Self-pay

## 2023-03-04 ENCOUNTER — Encounter
Admission: RE | Disposition: A | Discharge status: Home / Self Care | Payer: Self-pay | Source: Ambulatory Visit | Attending: Gastroenterology

## 2023-03-04 DIAGNOSIS — R569 Unspecified convulsions: Secondary | ICD-10-CM | POA: Insufficient documentation

## 2023-03-04 DIAGNOSIS — I509 Heart failure, unspecified: Secondary | ICD-10-CM | POA: Insufficient documentation

## 2023-03-04 DIAGNOSIS — Z95810 Presence of automatic (implantable) cardiac defibrillator: Secondary | ICD-10-CM | POA: Insufficient documentation

## 2023-03-04 DIAGNOSIS — Z6827 Body mass index (BMI) 27.0-27.9, adult: Secondary | ICD-10-CM | POA: Diagnosis not present

## 2023-03-04 DIAGNOSIS — E1065 Type 1 diabetes mellitus with hyperglycemia: Secondary | ICD-10-CM | POA: Diagnosis not present

## 2023-03-04 DIAGNOSIS — I252 Old myocardial infarction: Secondary | ICD-10-CM | POA: Insufficient documentation

## 2023-03-04 DIAGNOSIS — Z794 Long term (current) use of insulin: Secondary | ICD-10-CM | POA: Insufficient documentation

## 2023-03-04 DIAGNOSIS — I110 Hypertensive heart disease with heart failure: Secondary | ICD-10-CM | POA: Diagnosis not present

## 2023-03-04 DIAGNOSIS — Z955 Presence of coronary angioplasty implant and graft: Secondary | ICD-10-CM | POA: Diagnosis not present

## 2023-03-04 DIAGNOSIS — I2510 Atherosclerotic heart disease of native coronary artery without angina pectoris: Secondary | ICD-10-CM | POA: Diagnosis not present

## 2023-03-04 DIAGNOSIS — Z1211 Encounter for screening for malignant neoplasm of colon: Secondary | ICD-10-CM | POA: Diagnosis not present

## 2023-03-04 DIAGNOSIS — I5022 Chronic systolic (congestive) heart failure: Secondary | ICD-10-CM | POA: Diagnosis not present

## 2023-03-04 DIAGNOSIS — G4733 Obstructive sleep apnea (adult) (pediatric): Secondary | ICD-10-CM | POA: Insufficient documentation

## 2023-03-04 HISTORY — PX: COLONOSCOPY WITH PROPOFOL: SHX5780

## 2023-03-04 LAB — GLUCOSE, CAPILLARY: Glucose-Capillary: 182 mg/dL — ABNORMAL HIGH (ref 70–99)

## 2023-03-04 SURGERY — COLONOSCOPY WITH PROPOFOL
Anesthesia: General

## 2023-03-04 MED ORDER — STERILE WATER FOR IRRIGATION IR SOLN
Status: DC | PRN
  Administered 2023-03-04 (×2): 60 mL

## 2023-03-04 MED ORDER — LIDOCAINE HCL (CARDIAC) PF 100 MG/5ML IV SOSY
PREFILLED_SYRINGE | INTRAVENOUS | Status: DC | PRN
  Administered 2023-03-04: 60 mg via INTRAVENOUS

## 2023-03-04 MED ORDER — LIDOCAINE HCL (PF) 2 % IJ SOLN
INTRAMUSCULAR | Status: AC
  Filled 2023-03-04: qty 5

## 2023-03-04 MED ORDER — SODIUM CHLORIDE 0.9 % IV SOLN: INTRAVENOUS | Status: DC

## 2023-03-04 MED ORDER — PROPOFOL 500 MG/50ML IV EMUL
INTRAVENOUS | Status: DC | PRN
  Administered 2023-03-04: 75 ug/kg/min via INTRAVENOUS

## 2023-03-04 MED ORDER — PROPOFOL 10 MG/ML IV BOLUS
INTRAVENOUS | Status: DC | PRN
  Administered 2023-03-04: 100 mg via INTRAVENOUS

## 2023-03-04 MED ORDER — PROPOFOL 1000 MG/100ML IV EMUL
INTRAVENOUS | Status: AC
  Filled 2023-03-04: qty 100

## 2023-03-04 NOTE — H&P (Signed)
Joseph Mood, MD [address], [address], [place], [place], [address] [address], [address], [place], [place], [address] Phone: [phone]  Fax: [phone]  Primary Care Physician:  [facility]   Pre-Procedure History & Physical: HPI:  Joseph [name] is a 52 y.o. male is here for an colonoscopy.   Past Medical History:  Diagnosis Date   AICD (automatic cardioverter/defibrillator) present 2011   Asthma    Cancer (HCC)    prostate   CHF (congestive heart failure) (HCC)    Coronary artery disease    Diabetes mellitus without complication (HCC)    Dyspnea    ED (erectile dysfunction)    GERD (gastroesophageal reflux disease)    Hyperlipidemia    Hypertension    Ischemic cardiomyopathy    LADA (latent autoimmune diabetes in adults), managed as type 1 (HCC)    Myocardial infarction (HCC) 2011   anterior MI at [facility] s/p BMS to ostial/proximal LAD   Neuromuscular disorder (HCC)    problems in arms. finger tips have been numb/tingling. voltaren works   Obstructive sleep apnea 2018   cpap. has not used lately. uses a breathe-right strip on nose   Seizures (HCC)    diabetes seizures (low blood sugar); last one 08/2018   Systolic heart failure (HCC)    Vitamin D deficiency     Past Surgical History:  Procedure Laterality Date   CARDIAC DEFIBRILLATOR PLACEMENT  08/30/2010   CHOLECYSTECTOMY N/A 11/07/2017   Procedure: LAPAROSCOPIC CHOLECYSTECTOMY;  Surgeon: Carolan Shiver, MD;  Location: [facility] ORS;  Service: General;  Laterality: N/A;   CORONARY ANGIOPLASTY  2011   stent placed x 1   CYSTOSCOPY N/A 01/03/2020   Procedure: CYSTOSCOPY FLEXIBLE;  Surgeon: Vanna Scotland, MD;  Location: [facility] ORS;  Service: Urology;  Laterality: N/A;   ESOPHAGOGASTRODUODENOSCOPY (EGD) WITH PROPOFOL N/A 03/20/2018   Procedure: ESOPHAGOGASTRODUODENOSCOPY (EGD) WITH PROPOFOL;  Surgeon: Scot Jun, MD;  Location: [facility] ENDOSCOPY;  Service: Endoscopy;  Laterality: N/A;   LEFT  HEART CATH AND CORONARY ANGIOGRAPHY N/A 11/23/2016   Procedure: Left Heart Cath and Coronary Angiography;  Surgeon: Yvonne Kendall, MD;  Location: [facility] INVASIVE CV LAB;  Service: Cardiovascular;  Laterality: N/A;   PROSTATE BIOPSY  2019   PROSTATE BIOPSY N/A 10/23/2019   Procedure: PROSTATE BIOPSY;  Surgeon: Riki Altes, MD;  Location: [facility] ORS;  Service: Urology;  Laterality: N/A;   ROBOT ASSISTED LAPAROSCOPIC RADICAL PROSTATECTOMY N/A 01/03/2020   Procedure: XI ROBOTIC ASSISTED LAPAROSCOPIC RADICAL PROSTATECTOMY;  Surgeon: Vanna Scotland, MD;  Location: [facility] ORS;  Service: Urology;  Laterality: N/A;   TRANSRECTAL ULTRASOUND N/A 10/23/2019   Procedure: TRANSRECTAL ULTRASOUND;  Surgeon: Riki Altes, MD;  Location: [facility] ORS;  Service: Urology;  Laterality: N/A;   WRIST SURGERY Right 2008    Prior to Admission medications   Medication Sig Start Date End Date Taking? Authorizing Provider  acetaminophen (TYLENOL) 500 MG tablet Take 500 mg by mouth every 6 (six) hours as needed for fever.   Yes [provider]  albuterol (PROVENTIL HFA) 108 (90 Base) MCG/ACT inhaler Inhale 2 puffs into the lungs every 4 (four) hours as needed for wheezing or shortness of breath. 10/08/18  Yes Sharman Cheek, MD  aspirin EC 81 MG tablet Take 81 mg by mouth every morning.   Yes [provider]  atorvastatin (LIPITOR) 80 MG tablet Take 1 tablet (80 mg total) by mouth daily. 02/02/22  Yes Laurey Morale, MD  bisoprolol (ZEBETA) 10 MG  tablet Take 1 tablet (10 mg total) by mouth daily. 07/01/22  Yes Andrey Farmer, PA-C  Cholecalciferol 25 MCG (1000 UT) capsule Take 1,000 Units by mouth daily.   Yes [provider]  digoxin (LANOXIN) 0.125 MG tablet TAKE 1 TABLET BY MOUTH DAILY 11/18/22  Yes Laurey Morale, MD  fluticasone [facility]) 50 MCG/ACT nasal spray Place 2 sprays into both nostrils daily as needed for allergies.  01/26/16  Yes [provider]  glucagon, human  recombinant, (GLUCAGEN) 1 MG injection Inject into the muscle as needed. 02/07/18  Yes [provider]  insulin aspart (NOVOLOG) 100 UNIT/ML FlexPen Inject 18-20-22 u TID AC plus sliding scale for up to 75 units TDD 01/10/20  Yes [provider]  insulin degludec (TRESIBA FLEXTOUCH) 200 UNIT/ML FlexTouch Pen Inject 60 Units into the skin daily. 01/29/20  Yes [provider]  Insulin Pen Needle (B-D ULTRAFINE III SHORT PEN) 31G X 8 MM MISC USE 5 TIMES DAILY AS  DIRECTED 12/01/19  Yes Sharman Cheek, MD  isosorbide-hydrALAZINE (BIDIL) 20-37.5 MG tablet Take 0.5 tablets by mouth 3 (three) times daily. 11/24/22  Yes Laurey Morale, MD  loratadine (CLARITIN) 10 MG tablet Take 10 mg by mouth daily as needed.    Yes [provider]  naproxen sodium (ALEVE) 220 MG tablet Take 220 mg by mouth daily as needed.   Yes [provider]  potassium chloride (KLOR-CON M10) 10 MEQ tablet TAKE 2 TABLETS BY MOUTH DAILY 06/14/22  Yes Vida Rigger, MD  sacubitril-valsartan (ENTRESTO) 97-103 MG TAKE 1 TABLET BY MOUTH TWICE A DAY. STOP LOSARTAN 08/24/22  Yes Laurey Morale, MD  spironolactone (ALDACTONE) 25 MG tablet TAKE 1 TABLET (25 MG TOTAL) BY MOUTH DAILY. 02/11/23  Yes Laurey Morale, MD  SYMBICORT 160-4.5 MCG/ACT inhaler Inhale 2 puffs into the lungs 2 (two) times daily. 05/27/20  Yes [provider]  tadalafil (CIALIS) 20 MG tablet Take 1 tablet (20 mg total) by mouth daily as needed for erectile dysfunction. 11/25/21  Yes Vanna Scotland, MD  torsemide (DEMADEX) 20 MG tablet Take 60 mg by mouth daily.   Yes [provider]  traMADol (ULTRAM) 50 MG tablet Take 50 mg by mouth every 4 (four) hours as needed. 01/06/22  Yes [provider]    Allergies as of 02/07/2023 - Review Complete 01/19/2023  Allergen Reaction Noted   Vancomycin Shortness Of Breath 04/18/2016   Lisinopril Rash 04/18/2016    Family History  Problem Relation Age of Onset    Hypertension Mother    Heart attack Paternal Grandmother    Heart attack Father    Bladder Cancer Neg Hx    Kidney cancer Neg Hx    Prostate cancer Neg Hx     Social History   Socioeconomic History   Marital status: Divorced    Spouse name: Not on file   Number of children: Not on file   Years of education: Not on file   Highest education level: Not on file  Occupational History   Occupation: Location manager  Tobacco Use   Smoking status: Never   Smokeless tobacco: Never  Vaping Use   Vaping Use: Never used  Substance and Sexual Activity   Alcohol use: No   Drug use: No   Sexual activity: Yes  Other Topics Concern   Not on file  Social History Narrative   Not on file   Social Determinants of Health   Financial Resource Strain: Not on file  Food Insecurity: Not on file  Transportation Needs: Not on file  Physical Activity: Not on file  Stress: Not on file  Social Connections: Not on file  Intimate Partner Violence: Not on file    Review of Systems: See HPI, otherwise negative ROS  Physical Exam: BP 133/80   Pulse 68   Temp (!) 96.6 F (35.9 C) (Temporal)   Resp 18   Ht 6\' 1"  (1.854 m)   Wt 93 kg   SpO2 99%   BMI 27.05 kg/m  General:   Alert,  pleasant and cooperative in NAD Head:  Normocephalic and atraumatic. Neck:  Supple; no masses or thyromegaly. Lungs:  Clear throughout to auscultation, normal respiratory effort.    Heart:  +S1, +S2, Regular rate and rhythm, No edema. Abdomen:  Soft, nontender and nondistended. Normal bowel sounds, without guarding, and without rebound.   Neurologic:  Alert and  oriented x4;  grossly normal neurologically.  Impression/Plan: Jenetta Loges is here for an colonoscopy to be performed for Screening colonoscopy average risk   Risks, benefits, limitations, and alternatives regarding  colonoscopy have been reviewed with the patient.  Questions have been answered.  All parties agreeable.   Joseph Mood, MD  03/04/2023,  7:47 AM

## 2023-03-04 NOTE — Anesthesia Postprocedure Evaluation (Signed)
Anesthesia Post Note  Patient: Joseph [name]  Procedure(s) Performed: COLONOSCOPY WITH PROPOFOL  Patient location during evaluation: PACU Anesthesia Type: General Level of consciousness: awake and awake and alert Pain management: pain level controlled Vital Signs Assessment: post-procedure vital signs reviewed and stable Respiratory status: spontaneous breathing and nonlabored ventilation Cardiovascular status: stable Anesthetic complications: no   No notable events documented.   Last Vitals:  Vitals:   03/04/23 0822 03/04/23 0832  BP: 116/71 116/71  Pulse: 72 64  Resp: 19 12  Temp:    SpO2: 99% 99%    Last Pain:  Vitals:   03/04/23 0832  TempSrc:   PainSc: 0-No pain                 VAN STAVEREN,Lyell Clugston

## 2023-03-04 NOTE — Transfer of Care (Signed)
Immediate Anesthesia Transfer of Care Note  Patient: Joseph [name]  Procedure(s) Performed: COLONOSCOPY WITH PROPOFOL  Patient Location: PACU and Endoscopy Unit  Anesthesia Type:General  Level of Consciousness: oriented, drowsy, and patient cooperative  Airway & Oxygen Therapy: Patient Spontanous Breathing  Post-op Assessment: Report given to RN and Post -op Vital signs reviewed and stable  Post vital signs: Reviewed and stable  Last Vitals:  Vitals Value Taken Time  BP 103/56 03/04/23 0811  Temp    Pulse 70 03/04/23 0811  Resp 22 03/04/23 0811  SpO2 99 % 03/04/23 0811  Vitals shown include unvalidated device data.  Last Pain:  Vitals:   03/04/23 0705  TempSrc: Temporal  PainSc: 0-No pain         Complications: No notable events documented.

## 2023-03-04 NOTE — Op Note (Signed)
[facility]astroenterology Patient Name: Joseph [name] Procedure Date: 03/04/2023 7:51 AM MRN: [id number] Account #: [id number] Date of Birth: April 05, 1971 Admit Type: Outpatient Age: 52 Room: [facility] ENDO ROOM 4 Gender: Male Note Status: Finalized Instrument Name: Prentice Docker [id number] Procedure:             Colonoscopy Indications:           Screening for colorectal malignant neoplasm Providers:             Wyline Mood MD, MD Medicines:             Monitored Anesthesia Care Complications:         No immediate complications. Procedure:             Pre-Anesthesia Assessment:                        - Prior to the procedure, a History and Physical was                         performed, and patient medications, allergies and                         sensitivities were reviewed. The patient's tolerance                         of previous anesthesia was reviewed.                        - The risks and benefits of the procedure and the                         sedation options and risks were discussed with the                         patient. All questions were answered and informed                         consent was obtained.                        - ASA Grade Assessment: II - A patient with mild                         systemic disease.                        After obtaining informed consent, the colonoscope was                         passed under direct vision. Throughout the procedure,                         the patient's blood pressure, pulse, and oxygen                         saturations were monitored continuously. The                         Colonoscope was introduced through the anus and  advanced to the the cecum, identified by the                         appendiceal orifice. The colonoscopy was performed                         with ease. The patient tolerated the procedure well.                         The quality of the bowel preparation  was excellent.                         The ileocecal valve, appendiceal orifice, and rectum                         were photographed. Findings:      The perianal and digital rectal examinations were normal.      The entire examined colon appeared normal on direct and retroflexion       views. Impression:            - The entire examined colon is normal on direct and                         retroflexion views.                        - No specimens collected. Recommendation:        - Discharge patient to home (with escort).                        - Resume previous diet.                        - Continue present medications.                        - Repeat colonoscopy in 10 years for screening                         purposes. Procedure Code(s):     --- Professional ---                        [phone], Colonoscopy, flexible; diagnostic, including                         collection of specimen(s) by brushing or washing, when                         performed (separate procedure) Diagnosis Code(s):     --- Professional ---                        Z12.11, Encounter for screening for malignant neoplasm                         of colon CPT copyright 2022 [employer]. All rights reserved. The codes documented in this report are preliminary and upon coder review may  be revised to meet current compliance requirements. Wyline Mood, MD Wyline Mood MD, MD 03/04/2023 8:08:21 AM This report has  been signed electronically. Number of Addenda: 0 Note Initiated On: 03/04/2023 7:51 AM Scope Withdrawal Time: 0 hours 7 minutes 50 seconds  Total Procedure Duration: 0 hours 10 minutes 42 seconds  Estimated Blood Loss:  Estimated blood loss: none.      [facility]

## 2023-03-04 NOTE — Anesthesia Preprocedure Evaluation (Signed)
Anesthesia Evaluation  Patient identified by MRN, date of birth, ID band Patient awake    Reviewed: Allergy & Precautions, NPO status , Patient's Chart, lab work & pertinent test results  Airway Mallampati: III  TM Distance: >3 FB Neck ROM: Full    Dental  (+) Teeth Intact   Pulmonary neg pulmonary ROS, sleep apnea and Continuous Positive Airway Pressure Ventilation    Pulmonary exam normal  + decreased breath sounds      Cardiovascular Exercise Tolerance: Good hypertension, Pt. on medications + CAD, + Past MI, + Cardiac Stents and +CHF  negative cardio ROS Normal cardiovascular exam+ pacemaker  Rhythm:Regular Rate:Normal     Neuro/Psych Seizures -, Well Controlled,  negative neurological ROS  negative psych ROS   GI/Hepatic negative GI ROS, Neg liver ROS,GERD  Medicated,,  Endo/Other  negative endocrine ROSdiabetes, Poorly Controlled, Type 1  Morbid obesity  Renal/GU negative Renal ROS  negative genitourinary   Musculoskeletal   Abdominal  (+) + obese  Peds negative pediatric ROS (+)  Hematology negative hematology ROS (+)   Anesthesia Other Findings Past Medical History: 2011: AICD (automatic cardioverter/defibrillator) present No date: Asthma No date: Cancer [facility])     Comment:  prostate No date: CHF (congestive heart failure) (HCC) No date: Coronary artery disease No date: Diabetes mellitus without complication (HCC) No date: Dyspnea No date: ED (erectile dysfunction) No date: GERD (gastroesophageal reflux disease) No date: Hyperlipidemia No date: Hypertension No date: Ischemic cardiomyopathy No date: LADA (latent autoimmune diabetes in adults), managed as type  1 (HCC) 2011: Myocardial infarction (HCC)     Comment:  anterior MI at [facility] s/p BMS to ostial/proximal LAD No date: Neuromuscular disorder (HCC)     Comment:  problems in arms. finger tips have been numb/tingling.               voltaren  works 2018: Obstructive sleep apnea     Comment:  cpap. has not used lately. uses a breathe-right strip on              nose No date: Seizures (HCC)     Comment:  diabetes seizures (low blood sugar); last one 08/2018 No date: Systolic heart failure (HCC) No date: Vitamin D deficiency  Past Surgical History: 08/30/2010: CARDIAC DEFIBRILLATOR PLACEMENT 11/07/2017: CHOLECYSTECTOMY; N/A     Comment:  Procedure: LAPAROSCOPIC CHOLECYSTECTOMY;  Surgeon:               Carolan Shiver, MD;  Location: [facility] ORS;  Service:              General;  Laterality: N/A; 2011: CORONARY ANGIOPLASTY     Comment:  stent placed x 1 01/03/2020: CYSTOSCOPY; N/A     Comment:  Procedure: CYSTOSCOPY FLEXIBLE;  Surgeon: Vanna Scotland, MD;  Location: [facility] ORS;  Service: Urology;                Laterality: N/A; 03/20/2018: ESOPHAGOGASTRODUODENOSCOPY (EGD) WITH PROPOFOL; N/A     Comment:  Procedure: ESOPHAGOGASTRODUODENOSCOPY (EGD) WITH               PROPOFOL;  Surgeon: Scot Jun, MD;  Location:               [facility] ENDOSCOPY;  Service: Endoscopy;  Laterality: N/A; 11/23/2016: LEFT HEART CATH AND CORONARY ANGIOGRAPHY; N/A     Comment:  Procedure: Left Heart Cath and Coronary Angiography;  Surgeon: Yvonne Kendall, MD;  Location: [facility] INVASIVE CV              LAB;  Service: Cardiovascular;  Laterality: N/A; 2019: PROSTATE BIOPSY 10/23/2019: PROSTATE BIOPSY; N/A     Comment:  Procedure: PROSTATE BIOPSY;  Surgeon: Riki Altes,               MD;  Location: [facility] ORS;  Service: Urology;  Laterality:               N/A; 01/03/2020: ROBOT ASSISTED LAPAROSCOPIC RADICAL PROSTATECTOMY; N/A     Comment:  Procedure: XI ROBOTIC ASSISTED LAPAROSCOPIC RADICAL               PROSTATECTOMY;  Surgeon: Vanna Scotland, MD;  Location:               [facility] ORS;  Service: Urology;  Laterality: N/A; 10/23/2019: TRANSRECTAL ULTRASOUND; N/A     Comment:  Procedure: TRANSRECTAL ULTRASOUND;  Surgeon:  Riki Altes, MD;  Location: [facility] ORS;  Service: Urology;                Laterality: N/A; 2008: WRIST SURGERY; Right  BMI    Body Mass Index: 27.05 kg/m      Reproductive/Obstetrics negative OB ROS                             Anesthesia Physical Anesthesia Plan  ASA: 4  Anesthesia Plan: General   Post-op Pain Management:    Induction: Intravenous  PONV Risk Score and Plan: Propofol infusion and TIVA  Airway Management Planned: Natural Airway  Additional Equipment:   Intra-op Plan:   Post-operative Plan:   Informed Consent: I have reviewed the patients History and Physical, chart, labs and discussed the procedure including the risks, benefits and alternatives for the proposed anesthesia with the patient or authorized representative who has indicated his/her understanding and acceptance.     Dental Advisory Given  Plan Discussed with: CRNA and Surgeon  Anesthesia Plan Comments:        Anesthesia Quick Evaluation

## 2023-03-08 ENCOUNTER — Encounter: Payer: Self-pay | Admitting: Gastroenterology

## 2023-03-09 ENCOUNTER — Encounter: Payer: Self-pay | Admitting: Cardiology

## 2023-03-09 ENCOUNTER — Ambulatory Visit (HOSPITAL_BASED_OUTPATIENT_CLINIC_OR_DEPARTMENT_OTHER): Payer: BC Managed Care – PPO | Admitting: Cardiology

## 2023-03-09 ENCOUNTER — Other Ambulatory Visit
Admission: RE | Admit: 2023-03-09 | Discharge: 2023-03-09 | Disposition: A | Discharge status: Home / Self Care | Payer: BC Managed Care – PPO | Attending: Cardiology | Admitting: Cardiology

## 2023-03-09 VITALS — BP 101/49 | HR 71 | Wt 211.8 lb

## 2023-03-09 DIAGNOSIS — Z794 Long term (current) use of insulin: Secondary | ICD-10-CM | POA: Insufficient documentation

## 2023-03-09 DIAGNOSIS — J45909 Unspecified asthma, uncomplicated: Secondary | ICD-10-CM | POA: Diagnosis not present

## 2023-03-09 DIAGNOSIS — R300 Dysuria: Secondary | ICD-10-CM | POA: Diagnosis not present

## 2023-03-09 DIAGNOSIS — I252 Old myocardial infarction: Secondary | ICD-10-CM | POA: Diagnosis not present

## 2023-03-09 DIAGNOSIS — I5022 Chronic systolic (congestive) heart failure: Secondary | ICD-10-CM | POA: Insufficient documentation

## 2023-03-09 DIAGNOSIS — E109 Type 1 diabetes mellitus without complications: Secondary | ICD-10-CM | POA: Diagnosis not present

## 2023-03-09 DIAGNOSIS — I255 Ischemic cardiomyopathy: Secondary | ICD-10-CM | POA: Insufficient documentation

## 2023-03-09 DIAGNOSIS — G4733 Obstructive sleep apnea (adult) (pediatric): Secondary | ICD-10-CM | POA: Insufficient documentation

## 2023-03-09 DIAGNOSIS — I2510 Atherosclerotic heart disease of native coronary artery without angina pectoris: Secondary | ICD-10-CM | POA: Insufficient documentation

## 2023-03-09 DIAGNOSIS — Z79899 Other long term (current) drug therapy: Secondary | ICD-10-CM | POA: Diagnosis not present

## 2023-03-09 DIAGNOSIS — I110 Hypertensive heart disease with heart failure: Secondary | ICD-10-CM | POA: Insufficient documentation

## 2023-03-09 LAB — BRAIN NATRIURETIC PEPTIDE: B Natriuretic Peptide: 35.6 pg/mL (ref 0.0–100.0)

## 2023-03-09 LAB — BASIC METABOLIC PANEL
Anion gap: 9 (ref 5–15)
BUN: 35 mg/dL — ABNORMAL HIGH (ref 6–20)
CO2: 25 mmol/L (ref 22–32)
Calcium: 8.5 mg/dL — ABNORMAL LOW (ref 8.9–10.3)
Chloride: 103 mmol/L (ref 98–111)
Creatinine, Ser: 1.68 mg/dL — ABNORMAL HIGH (ref 0.61–1.24)
GFR, Estimated: 49 mL/min — ABNORMAL LOW (ref >60)
Glucose, Bld: 158 mg/dL — ABNORMAL HIGH (ref 70–99)
Potassium: 4.3 mmol/L (ref 3.5–5.1)
Sodium: 137 mmol/L (ref 135–145)

## 2023-03-09 LAB — DIGOXIN LEVEL: Digoxin Level: 0.5 ng/mL — ABNORMAL LOW (ref 0.8–2.0)

## 2023-03-09 NOTE — Progress Notes (Signed)
Advanced Heart Failure Clinic Progress Note    PCP: Dr. Marcello Fennel Cardiology: Dr. Darrold Junker HF Cardiology: Dr. Shirlee Latch  52 y.o. with history of type 1 DM, HTN, CAD, chronic systolic CHF, severe asthma, OSA not adherent with CPAP.  Patient had anterior STEMI with DES to proximal LAD in 6/11.  Echo at the time with EF < 35%.  Patient eventually had [employer] ICD placed.  LV systolic function has been low since 6/11 MI.  He presented again in 2/18 with chest pain and dyspnea as well as anterior STE on ECG, but cath showed only moderate proximal LAD in-stent restenosis, not explaining his ECG changes.  Most recent echo in 1/23 showed EF 25-30%, normal RV, mild-moderate MR.   CPX 5/23>>Submaximal test. Moderate to severe functional limitation due to a combination of HF and moderate to severe restrictive lung physiology due to patient's body habitus. Chronotropic incompetence was noted. Suspect lung limitation predominates.   Diagnosed w/ COVID 8/23 but did not require hospitalization. Patient had COVID-19 again in 1/24.    Echo in 2/24 showed EF 35%, septal/apical akinesis, normal RV.   Echo in 3/24 as part of barostimulation activation therapy workup showed EF 30-35%, wall motion abnormalities consistent with LAD-territory MI, mild LV dilation, normal RV, normal IVC.   He returns for followup of CHF.  Weight is down 4 lbs.  He is waiting for insurance approval for barostimulation activation therapy.  Mild lightheadedness with standing at times.  He is short of breath walking up hills/inclines/stairs. No chest pain.  No orthopnea/PND.  +Chronic cough.   Labs (12/23): K 5.4, creatinine 1.48, glucose 508 (labs drawn in setting of marked hyperglycemia).  Labs (1/24): K 3.7, creatinine 1.28, LDL 65 Labs (3/24): K 4.7, creatinine 0.82, pro-BNP 166 Labs (4/24): K 4.1, creatinine 1.43, Lp(a) 63  PMH: 1. Type 1 diabetes: Diagnosis in 1997. H/o DKA.  2. GERD 3. Hyperlipidemia 4. HTN 5. OSA: Not  adherent with CPAP 6. H/o seizure 7. CAD: 6/11 anterior MI with PCI to LAD.  - 2/18 STEMI by ECG => cath with moderate in-stent restenosis in the ostial/proximal LAD stent but did not explain ECG changes (?recanalized).   8. Chronic systolic CHF: Ischemic cardiomyopathy. [employer] ICD.  - Echo (6/11): EF < 35%.  - Echo (2017): EF 40% - Echo (2/18): EF 25-30% - Echo (1/23): EF 25-30%, normal RV, mild-moderate MR.  - CPX (5/23): VE/VCO2 slope 31, VO2 15.8, RER 1.0.  Submaximal. Probably mild HF limitation with further limitation due to deconditioning and moderate restriction by spirometry.  - Echo (2/24): EF 35%, septal/apical akinesis, normal RV.   - Echo (3/24): EF 30-35%, wall motion abnormalities consistent with LAD-territory MI, mild LV dilation, normal RV, normal IVC.  9. Severe asthma - Previously seen by Dr. Karna Christmas in 2021  Social History   Socioeconomic History   Marital status: Divorced    Spouse name: Not on file   Number of children: Not on file   Years of education: Not on file   Highest education level: Not on file  Occupational History   Occupation: Location manager  Tobacco Use   Smoking status: Never   Smokeless tobacco: Never  Vaping Use   Vaping Use: Never used  Substance and Sexual Activity   Alcohol use: No   Drug use: No   Sexual activity: Yes  Other Topics Concern   Not on file  Social History Narrative   Not on file   Social Determinants of Health  Financial Resource Strain: Not on file  Food Insecurity: Not on file  Transportation Needs: Not on file  Physical Activity: Not on file  Stress: Not on file  Social Connections: Not on file  Intimate Partner Violence: Not on file   Family History  Problem Relation Age of Onset   Hypertension Mother    Heart attack Paternal Grandmother    Heart attack Father    Bladder Cancer Neg Hx    Kidney cancer Neg Hx    Prostate cancer Neg Hx    ROS: all systems reviewed and negative except as  per HPI.   Current Outpatient Medications  Medication Sig Dispense Refill   acetaminophen (TYLENOL) 500 MG tablet Take 500 mg by mouth every 6 (six) hours as needed for fever.     albuterol (PROVENTIL HFA) 108 (90 Base) MCG/ACT inhaler Inhale 2 puffs into the lungs every 4 (four) hours as needed for wheezing or shortness of breath. 1 Inhaler 0   aspirin EC 81 MG tablet Take 81 mg by mouth every morning.     atorvastatin (LIPITOR) 80 MG tablet Take 1 tablet (80 mg total) by mouth daily. 90 tablet 3   bisoprolol (ZEBETA) 10 MG tablet Take 1 tablet (10 mg total) by mouth daily. 90 tablet 3   Cholecalciferol 25 MCG (1000 UT) capsule Take 1,000 Units by mouth daily.     digoxin (LANOXIN) 0.125 MG tablet TAKE 1 TABLET BY MOUTH DAILY 90 tablet 3   fluticasone (FLONASE) 50 MCG/ACT nasal spray Place 2 sprays into both nostrils daily as needed for allergies.      glucagon, human recombinant, (GLUCAGEN) 1 MG injection Inject into the muscle as needed.     insulin aspart (NOVOLOG) 100 UNIT/ML FlexPen Inject 18-20-22 u TID AC plus sliding scale for up to 75 units TDD     insulin degludec (TRESIBA FLEXTOUCH) 200 UNIT/ML FlexTouch Pen Inject 60 Units into the skin daily.     Insulin Pen Needle (B-D ULTRAFINE III SHORT PEN) 31G X 8 MM MISC USE 5 TIMES DAILY AS  DIRECTED 75 each 0   loratadine (CLARITIN) 10 MG tablet Take 10 mg by mouth daily as needed.      naproxen sodium (ALEVE) 220 MG tablet Take 220 mg by mouth daily as needed.     potassium chloride (KLOR-CON M10) 10 MEQ tablet TAKE 2 TABLETS BY MOUTH DAILY 180 tablet 3   sacubitril-valsartan (ENTRESTO) 97-103 MG TAKE 1 TABLET BY MOUTH TWICE A DAY. STOP LOSARTAN 60 tablet 6   spironolactone (ALDACTONE) 25 MG tablet TAKE 1 TABLET (25 MG TOTAL) BY MOUTH DAILY. 90 tablet 3   SYMBICORT 160-4.5 MCG/ACT inhaler Inhale 2 puffs into the lungs 2 (two) times daily.     tadalafil (CIALIS) 20 MG tablet Take 1 tablet (20 mg total) by mouth daily as needed for erectile  dysfunction. 30 tablet 11   torsemide (DEMADEX) 20 MG tablet Take 60 mg by mouth daily.     traMADol (ULTRAM) 50 MG tablet Take 50 mg by mouth every 4 (four) hours as needed.     No current facility-administered medications for this visit.   BP (!) 101/49   Pulse 71   Wt 211 lb 12.8 oz (96.1 kg)   SpO2 98%   BMI 27.94 kg/m  PHYSICAL EXAM: General: NAD Neck: No JVD, no thyromegaly or thyroid nodule.  Lungs: Clear to auscultation bilaterally with normal respiratory effort. CV: Nondisplaced PMI.  Heart regular S1/S2, no S3/S4, no murmur.  No peripheral edema.  No carotid bruit.  Normal pedal pulses.  Abdomen: Soft, nontender, no hepatosplenomegaly, no distention.  Skin: Intact without lesions or rashes.  Neurologic: Alert and oriented x 3.  Psych: Normal affect. Extremities: No clubbing or cyanosis.  HEENT: Normal.   Assessment/Plan: 1. CAD: S/p anterior MI in 2011. Denies chest pain  - Continue ASA 81 daily.  - Atorvastatin 80 mg, good lipids in 1/24.  2. Chronic systolic CHF: Ischemic cardiomyopathy with [employer] ICD.  EF has been low since 2011.  Most recent echo in 2/24 with EF 35%, normal RV. CPX 5/23 was a submaximal, mild HF limitation but additional functional limitation due to deconditioning and moderate restrictive lung disease by PFTs.  NYHA class II, not volume overloaded on exam.   - Continue torsemide 60 mg daily. BMET/BNP today. - Continue bisoprolol 10 mg daily.  - Continue spironolactone 25 mg daily.  - Continue digoxin, check level today.  - Continue Entresto 97/103 bid.   - No SGLT2 inhibitor due to type 1 diabetes.   - He was unable to tolerate low dose Bidil (0.5 tab tid) due to orthostatic symptoms. - He is interested in baroreceptor activation therapy.  We are still waiting to hear about insurance approval for this, will proceed if he can get coverage.  3. Type 1 diabetes: Following with Endocrine - Has not been well-controlled 4. Asthma:  Severe  per prior notes.  Not actively wheezing.  5. OSA: Needs to get back on CPAP, has home sleep study scheduled.     F/u 3 months   Marca Ancona,  03/09/2023

## 2023-03-09 NOTE — Patient Instructions (Signed)
Medication Changes:  We recommend that you continue on your current medications as directed. Please refer to the Current Medication list given to you today.   *If you need a refill on your cardiac medications before your next appointment, please call your pharmacy*  Lab Work:  Labs done today, your results will be available in MyChart, we will contact you for abnormal readings.   Follow-Up in:   Your physician recommends that you schedule a follow-up appointment in: 3 months with Dr. Shirlee Latch   Do the following things EVERYDAY: Weigh yourself in the morning before breakfast. Write it down and keep it in a log. Take your medicines as prescribed Eat low salt foods--Limit salt (sodium) to 2000 mg per day.  Stay as active as you can everyday Limit all fluids for the day to less than 2 liters    Need to Contact us:  If you have any questions or concerns before your next appointment please send [place] a message through [place] or call our office at [phone]

## 2023-03-11 ENCOUNTER — Encounter (HOSPITAL_COMMUNITY): Payer: Self-pay

## 2023-03-14 ENCOUNTER — Encounter (HOSPITAL_COMMUNITY): Payer: Self-pay

## 2023-03-14 ENCOUNTER — Telehealth: Payer: Self-pay

## 2023-03-14 NOTE — Telephone Encounter (Signed)
PT had colonoscopy on May 24th pt now have blood in stool. Pt notice blood Friday and Monday   [phone]

## 2023-03-15 DIAGNOSIS — I2101 ST elevation (STEMI) myocardial infarction involving left main coronary artery: Secondary | ICD-10-CM | POA: Diagnosis not present

## 2023-03-15 DIAGNOSIS — I255 Ischemic cardiomyopathy: Secondary | ICD-10-CM | POA: Diagnosis not present

## 2023-03-15 DIAGNOSIS — I2510 Atherosclerotic heart disease of native coronary artery without angina pectoris: Secondary | ICD-10-CM | POA: Diagnosis not present

## 2023-03-15 DIAGNOSIS — Z95810 Presence of automatic (implantable) cardiac defibrillator: Secondary | ICD-10-CM | POA: Diagnosis not present

## 2023-03-15 NOTE — Telephone Encounter (Signed)
Called patient back and had to leave him a voicemail to return my call.

## 2023-03-16 NOTE — Telephone Encounter (Signed)
Called patient and left another voicemail to call me back if he was still having blood in his stools.

## 2023-03-23 ENCOUNTER — Telehealth: Payer: Self-pay

## 2023-03-23 NOTE — Telephone Encounter (Signed)
Medication Samples have been provided to the patient.  Drug name: Entresto   Strength: 49/51 MG        Qty: 4 LOT: [id number]    Exp.Date: Mar 2025  Dosing instructions: take 2 tablets in the morning and 2 tablets at night.  The patient has been instructed regarding the correct time, dose, and frequency of taking this medication, including desired effects and most common side effects.   Electa Sniff 3:37 PM 03/23/2023

## 2023-03-25 ENCOUNTER — Encounter: Payer: Self-pay | Admitting: Adult Health

## 2023-03-25 ENCOUNTER — Ambulatory Visit (INDEPENDENT_AMBULATORY_CARE_PROVIDER_SITE_OTHER): Payer: BC Managed Care – PPO | Admitting: Adult Health

## 2023-03-25 VITALS — BP 120/60 | HR 68 | Temp 97.6°F | Ht 73.0 in | Wt 220.0 lb

## 2023-03-25 DIAGNOSIS — G4733 Obstructive sleep apnea (adult) (pediatric): Secondary | ICD-10-CM

## 2023-03-25 DIAGNOSIS — J4550 Severe persistent asthma, uncomplicated: Secondary | ICD-10-CM

## 2023-03-25 NOTE — Assessment & Plan Note (Signed)
Appears controlled on Symbicort.  Continue on Claritin and Flonase daily as needed.  Check PFTs on return  Plan  Patient Instructions  Continue on Symbicort 2 puffs twice daily, rinse after use Albuterol inhaler as needed Flonase and Claritin daily as needed.  Sleep study as planned. Healthy sleep regimen  Do not drive if sleepy.  Follow up in 2-3 months with PFT and As needed

## 2023-03-25 NOTE — Patient Instructions (Addendum)
Continue on Symbicort 2 puffs twice daily, rinse after use Albuterol inhaler as needed Flonase and Claritin daily as needed.  Sleep study as planned. Healthy sleep regimen  Do not drive if sleepy.  Follow up in 2-3 months with PFT and As needed

## 2023-03-25 NOTE — Progress Notes (Signed)
Reviewed and agree with assessment/plan.   Coralyn Helling, MD [facility] Pulmonary/Critical Care 03/25/2023, 4:17 PM Pager:  [phone]

## 2023-03-25 NOTE — Assessment & Plan Note (Signed)
History of moderate obstructive sleep apnea.  Previously on CPAP.  Patient has been off his CPAP for greater than 3 years.  Will have to repeat sleep study.  Home sleep study is pending.  Patient is to reach back out to [employer] to reschedule sleep study  Plan  Patient Instructions  Continue on Symbicort 2 puffs twice daily, rinse after use Albuterol inhaler as needed Flonase and Claritin daily as needed.  Sleep study as planned. Healthy sleep regimen  Do not drive if sleepy.  Follow up in 2-3 months with PFT and As needed

## 2023-03-25 NOTE — Progress Notes (Signed)
@Patient  ID: Joseph [name], male    DOB: [birth year], 52 y.o.   MRN: [id number]  Chief Complaint  Patient presents with   Follow-up    Referring provider: [facility]  HPI: 51 year old male never smoker seen for consult Feb 10, 2023 for asthma and sleep apnea Medical history significant for congestive heart failure, ischemic cardiomyopathy, insulin-dependent diabetes  TEST/EVENTS :  RAST 11/27/19>> dust mite, grasses, mold, tree bark, ragweed; IgE 401 HP panel 11/27/19 >> negative PFT 02/06/20 >> FEV1 1.92 (55%), FEV1% 65, TLC 57%, DLCO 61% FeNO 02/10/23 >> 76   Chest Imaging:  CT chest 05/08/21 >> lungs clear   Sleep Tests:  HST 11/30/16 >> AHI 19, SpO2 low 88%   Cardiac Tests:  CPET 02/17/22 >> limitation due to CHF and obesity Echo 12/15/22 >> EF 30 to 35%, grade 2 DD, mild MR  03/25/2023 Follow up : Asthma and OSA  Patient returns for 6-week follow-up.  Patient was seen last visit for consult for asthma and sleep apnea.  Patient has a history of severe persistent asthma with allergic phenotype.  He remains on Symbicort twice daily.  Says overall breathing is doing well.   No increased albuterol use.  Chest x-ray in January showed clear lungs. Did run out of Sherryll Burger recently says he felt his breathing was not as good but since restarting he is feeling better.   Patient has a history of moderate obstructive sleep apnea.  Was previously on CPAP but has been off his CPAP for greater than 3 years due to his machine broke.  Has ongoing snoring and daytime sleepiness.  Was set up for home sleep study which is still pending. Says he was not able to set up due to schedule conflicts.   Allergies  Allergen Reactions   Vancomycin Shortness Of Breath   Lisinopril Rash    Immunization History  Administered Date(s) Administered   Influenza,inj,Quad PF,6+ Mos 07/01/2017, 07/26/2019   Influenza-Unspecified 07/31/2012, 07/15/2014, 06/13/2015, 06/11/2022   Moderna SARS-COV2 Booster  Vaccination 01/23/2021   Moderna Sars-Covid-2 Vaccination 02/20/2020, 03/22/2020    Past Medical History:  Diagnosis Date   AICD (automatic cardioverter/defibrillator) present 2011   Asthma    Cancer (HCC)    prostate   CHF (congestive heart failure) (HCC)    Coronary artery disease    Diabetes mellitus without complication (HCC)    Dyspnea    ED (erectile dysfunction)    GERD (gastroesophageal reflux disease)    Hyperlipidemia    Hypertension    Ischemic cardiomyopathy    LADA (latent autoimmune diabetes in adults), managed as type 1 (HCC)    Myocardial infarction (HCC) 2011   anterior MI at [facility] s/p BMS to ostial/proximal LAD   Neuromuscular disorder (HCC)    problems in arms. finger tips have been numb/tingling. voltaren works   Obstructive sleep apnea 2018   cpap. has not used lately. uses a breathe-right strip on nose   Seizures (HCC)    diabetes seizures (low blood sugar); last one 08/2018   Systolic heart failure (HCC)    Vitamin D deficiency     Tobacco History: Social History   Tobacco Use  Smoking Status Never  Smokeless Tobacco Never   Counseling given: Not Answered   Outpatient Medications Prior to Visit  Medication Sig Dispense Refill   acetaminophen (TYLENOL) 500 MG tablet Take 500 mg by mouth every 6 (six) hours as needed for fever.     albuterol (PROVENTIL HFA) 108 (90 Base) MCG/ACT  inhaler Inhale 2 puffs into the lungs every 4 (four) hours as needed for wheezing or shortness of breath. 1 Inhaler 0   aspirin EC 81 MG tablet Take 81 mg by mouth every morning.     atorvastatin (LIPITOR) 80 MG tablet Take 1 tablet (80 mg total) by mouth daily. 90 tablet 3   bisoprolol (ZEBETA) 10 MG tablet Take 1 tablet (10 mg total) by mouth daily. 90 tablet 3   Cholecalciferol 25 MCG (1000 UT) capsule Take 1,000 Units by mouth daily.     digoxin (LANOXIN) 0.125 MG tablet TAKE 1 TABLET BY MOUTH DAILY 90 tablet 3   fluticasone (FLONASE) 50 MCG/ACT nasal spray Place 2  sprays into both nostrils daily as needed for allergies.      glucagon, human recombinant, (GLUCAGEN) 1 MG injection Inject into the muscle as needed.     insulin aspart (NOVOLOG) 100 UNIT/ML FlexPen Inject 18-20-22 u TID AC plus sliding scale for up to 75 units TDD     insulin degludec (TRESIBA FLEXTOUCH) 200 UNIT/ML FlexTouch Pen Inject 60 Units into the skin daily.     Insulin Pen Needle (B-D ULTRAFINE III SHORT PEN) 31G X 8 MM MISC USE 5 TIMES DAILY AS  DIRECTED 75 each 0   loratadine (CLARITIN) 10 MG tablet Take 10 mg by mouth daily as needed.      naproxen sodium (ALEVE) 220 MG tablet Take 220 mg by mouth daily as needed.     pantoprazole (PROTONIX) 20 MG tablet Take 20 mg by mouth as needed.     potassium chloride (KLOR-CON M10) 10 MEQ tablet TAKE 2 TABLETS BY MOUTH DAILY 180 tablet 3   sacubitril-valsartan (ENTRESTO) 97-103 MG TAKE 1 TABLET BY MOUTH TWICE A DAY. STOP LOSARTAN 60 tablet 6   spironolactone (ALDACTONE) 25 MG tablet TAKE 1 TABLET (25 MG TOTAL) BY MOUTH DAILY. 90 tablet 3   SYMBICORT 160-4.5 MCG/ACT inhaler Inhale 2 puffs into the lungs 2 (two) times daily.     tadalafil (CIALIS) 20 MG tablet Take 1 tablet (20 mg total) by mouth daily as needed for erectile dysfunction. 30 tablet 11   torsemide (DEMADEX) 20 MG tablet Take 60 mg by mouth daily.     traMADol (ULTRAM) 50 MG tablet Take 50 mg by mouth every 4 (four) hours as needed.     No facility-administered medications prior to visit.     Review of Systems:   Constitutional:   No  weight loss, night sweats,  Fevers, chills, +fatigue, or  lassitude.  HEENT:   No headaches,  Difficulty swallowing,  Tooth/dental problems, or  Sore throat,                No sneezing, itching, ear ache, nasal congestion, post nasal drip,   CV:  No chest pain,  Orthopnea, PND, swelling in lower extremities, anasarca, dizziness, palpitations, syncope.   GI  No heartburn, indigestion, abdominal pain, nausea, vomiting, diarrhea, change in  bowel habits, loss of appetite, bloody stools.   Resp: No shortness of breath with exertion or at rest.  No excess mucus, no productive cough,  No non-productive cough,  No coughing up of blood.  No change in color of mucus.  No wheezing.  No chest wall deformity  Skin: no rash or lesions.  GU: no dysuria, change in color of urine, no urgency or frequency.  No flank pain, no hematuria   MS:  No joint pain or swelling.  No decreased range of motion.  No back pain.    Physical Exam  BP 120/60 (BP Location: Left Arm, Cuff Size: Large)   Pulse 68   Temp 97.6 F (36.4 C)   Ht 6\' 1"  (1.854 m)   Wt 220 lb (99.8 kg)   SpO2 96%   BMI 29.03 kg/m   GEN: A/Ox3; pleasant , NAD, well nourished    HEENT:  [place]/AT,  NOSE-clear, THROAT-clear, no lesions, no postnasal drip or exudate noted.   NECK:  Supple w/ fair ROM; no JVD; normal carotid impulses w/o bruits; no thyromegaly or nodules palpated; no lymphadenopathy.    RESP  Clear  P & A; w/o, wheezes/ rales/ or rhonchi. no accessory muscle use, no dullness to percussion  CARD:  RRR, no m/r/g, no peripheral edema, pulses intact, no cyanosis or clubbing.  GI:   Soft & nt; nml bowel sounds; no organomegaly or masses detected.   Musco: Warm bil, no deformities or joint swelling noted.   Neuro: alert, no focal deficits noted.    Skin: Warm, no lesions or rashes    Lab Results:  CBC  BNP    Imaging: No results found.        No data to display          No results found for: "NITRICOXIDE"      Assessment & Plan:   Severe asthma without complication Appears controlled on Symbicort.  Continue on Claritin and Flonase daily as needed.  Check PFTs on return  Plan  Patient Instructions  Continue on Symbicort 2 puffs twice daily, rinse after use Albuterol inhaler as needed Flonase and Claritin daily as needed.  Sleep study as planned. Healthy sleep regimen  Do not drive if sleepy.  Follow up in 2-3 months with PFT  and As needed        Obstructive sleep apnea on CPAP History of moderate obstructive sleep apnea.  Previously on CPAP.  Patient has been off his CPAP for greater than 3 years.  Will have to repeat sleep study.  Home sleep study is pending.  Patient is to reach back out to [employer] to reschedule sleep study  Plan  Patient Instructions  Continue on Symbicort 2 puffs twice daily, rinse after use Albuterol inhaler as needed Flonase and Claritin daily as needed.  Sleep study as planned. Healthy sleep regimen  Do not drive if sleepy.  Follow up in 2-3 months with PFT and As needed          Rubye Oaks, NP 03/25/2023

## 2023-03-30 ENCOUNTER — Telehealth: Payer: Self-pay | Admitting: Pharmacist

## 2023-03-30 DIAGNOSIS — Z7982 Long term (current) use of aspirin: Secondary | ICD-10-CM | POA: Diagnosis not present

## 2023-03-30 DIAGNOSIS — J45909 Unspecified asthma, uncomplicated: Secondary | ICD-10-CM | POA: Diagnosis not present

## 2023-03-30 DIAGNOSIS — N5231 Erectile dysfunction following radical prostatectomy: Secondary | ICD-10-CM | POA: Diagnosis not present

## 2023-03-30 DIAGNOSIS — I252 Old myocardial infarction: Secondary | ICD-10-CM | POA: Diagnosis not present

## 2023-03-30 DIAGNOSIS — E785 Hyperlipidemia, unspecified: Secondary | ICD-10-CM | POA: Diagnosis not present

## 2023-03-30 DIAGNOSIS — Z79899 Other long term (current) drug therapy: Secondary | ICD-10-CM | POA: Diagnosis not present

## 2023-03-30 DIAGNOSIS — K219 Gastro-esophageal reflux disease without esophagitis: Secondary | ICD-10-CM | POA: Diagnosis not present

## 2023-03-30 DIAGNOSIS — G4733 Obstructive sleep apnea (adult) (pediatric): Secondary | ICD-10-CM | POA: Diagnosis not present

## 2023-03-30 DIAGNOSIS — I110 Hypertensive heart disease with heart failure: Secondary | ICD-10-CM | POA: Diagnosis not present

## 2023-03-30 DIAGNOSIS — E109 Type 1 diabetes mellitus without complications: Secondary | ICD-10-CM | POA: Diagnosis not present

## 2023-03-30 DIAGNOSIS — I5022 Chronic systolic (congestive) heart failure: Secondary | ICD-10-CM | POA: Diagnosis not present

## 2023-03-30 DIAGNOSIS — Z7951 Long term (current) use of inhaled steroids: Secondary | ICD-10-CM | POA: Diagnosis not present

## 2023-03-30 NOTE — Telephone Encounter (Signed)
Talked to patient about completing patient assistance form started during last OV. Patient was able to re-activate his commercial insurance and picked up medication from pharmacy.  Encourage to download copay cards to bring copay down to $10/ month or stop by office to get physical card.  Vernon Ariel Rodriguez-Guzman PharmD, BCPS 03/30/2023 9:37 AM

## 2023-04-04 ENCOUNTER — Other Ambulatory Visit (HOSPITAL_COMMUNITY): Payer: Self-pay | Admitting: Cardiology

## 2023-04-05 DIAGNOSIS — Z09 Encounter for follow-up examination after completed treatment for conditions other than malignant neoplasm: Secondary | ICD-10-CM | POA: Diagnosis not present

## 2023-04-05 DIAGNOSIS — N5231 Erectile dysfunction following radical prostatectomy: Secondary | ICD-10-CM | POA: Diagnosis not present

## 2023-04-20 ENCOUNTER — Other Ambulatory Visit (HOSPITAL_COMMUNITY): Payer: Self-pay | Admitting: Cardiology

## 2023-05-22 ENCOUNTER — Other Ambulatory Visit (HOSPITAL_COMMUNITY): Payer: Self-pay | Admitting: Cardiology

## 2023-05-23 ENCOUNTER — Other Ambulatory Visit: Payer: Self-pay

## 2023-05-23 NOTE — Telephone Encounter (Signed)
Medication Samples have been provided to the patient.  Drug name: Sherryll Burger       Strength: 49/51 MG        Qty: 1  LOT: [id number]  Exp.Date: 12/2023  Dosing instructions: Take two tablets by mouth two times daily  The patient has been instructed regarding the correct time, dose, and frequency of taking this medication, including desired effects and most common side effects.   Simonne Maffucci 3:04 PM 05/23/2023

## 2023-05-31 ENCOUNTER — Other Ambulatory Visit: Payer: BC Managed Care – PPO

## 2023-05-31 DIAGNOSIS — C61 Malignant neoplasm of prostate: Secondary | ICD-10-CM

## 2023-06-01 LAB — PSA: Prostate Specific Ag, Serum: <0.1 ng/mL (ref 0.0–4.0)

## 2023-06-03 ENCOUNTER — Ambulatory Visit: Payer: BC Managed Care – PPO | Attending: Cardiology | Admitting: Cardiology

## 2023-06-03 VITALS — BP 130/68 | HR 67 | Wt 216.0 lb

## 2023-06-03 DIAGNOSIS — E785 Hyperlipidemia, unspecified: Secondary | ICD-10-CM | POA: Insufficient documentation

## 2023-06-03 DIAGNOSIS — I5022 Chronic systolic (congestive) heart failure: Secondary | ICD-10-CM | POA: Diagnosis not present

## 2023-06-03 DIAGNOSIS — Z95810 Presence of automatic (implantable) cardiac defibrillator: Secondary | ICD-10-CM | POA: Diagnosis not present

## 2023-06-03 DIAGNOSIS — E109 Type 1 diabetes mellitus without complications: Secondary | ICD-10-CM | POA: Insufficient documentation

## 2023-06-03 DIAGNOSIS — G4733 Obstructive sleep apnea (adult) (pediatric): Secondary | ICD-10-CM | POA: Diagnosis not present

## 2023-06-03 DIAGNOSIS — J45909 Unspecified asthma, uncomplicated: Secondary | ICD-10-CM | POA: Insufficient documentation

## 2023-06-03 DIAGNOSIS — I2510 Atherosclerotic heart disease of native coronary artery without angina pectoris: Secondary | ICD-10-CM | POA: Insufficient documentation

## 2023-06-03 DIAGNOSIS — I110 Hypertensive heart disease with heart failure: Secondary | ICD-10-CM | POA: Insufficient documentation

## 2023-06-03 NOTE — Patient Instructions (Addendum)
Lab Work:  Labs done today, your results will be available in MyChart, we will contact you for abnormal readings.   Special Instructions // Education:  Do the following things EVERYDAY: Weigh yourself in the morning before breakfast. Write it down and keep it in a log. Take your medicines as prescribed Eat low salt foods--Limit salt (sodium) to 2000 mg per day.  Stay as active as you can everyday Limit all fluids for the day to less than 2 liters   Waiting for call from Robet Leu , RN to give information regarding insurance approval Barostim Neo device implant.   Charlena Cross, MD, FACS [facility] [phone]  Follow-Up in: follow up in October with Dr. Shirlee Latch.     If you have any questions or concerns before your next appointment please send [place] a message through [place] or call our office at [phone] Monday-Friday 8 am-5 pm.   If you have an urgent need after hours on the weekend please call your Primary Cardiologist or the [facility] in [place] at [phone].

## 2023-06-04 LAB — LIPID PANEL
Chol/HDL Ratio: 3.6 ratio (ref 0.0–5.0)
Cholesterol, Total: 154 mg/dL (ref 100–199)
HDL: 43 mg/dL (ref >39)
LDL Chol Calc (NIH): 89 mg/dL (ref 0–99)
Triglycerides: 120 mg/dL (ref 0–149)
VLDL Cholesterol Cal: 22 mg/dL (ref 5–40)

## 2023-06-04 LAB — BASIC METABOLIC PANEL
BUN/Creatinine Ratio: 16 (ref 9–20)
BUN: 18 mg/dL (ref 6–24)
CO2: 27 mmol/L (ref 20–29)
Calcium: 9.6 mg/dL (ref 8.7–10.2)
Chloride: 98 mmol/L (ref 96–106)
Creatinine, Ser: 1.13 mg/dL (ref 0.76–1.27)
Glucose: 369 mg/dL — ABNORMAL HIGH (ref 70–99)
Potassium: 5.4 mmol/L — ABNORMAL HIGH (ref 3.5–5.2)
Sodium: 137 mmol/L (ref 134–144)
eGFR: 78 mL/min/{1.73_m2} (ref >59)

## 2023-06-04 LAB — BRAIN NATRIURETIC PEPTIDE: BNP: 52.8 pg/mL (ref 0.0–100.0)

## 2023-06-04 LAB — DIGOXIN LEVEL: Digoxin, Serum: 0.8 ng/mL (ref 0.5–0.9)

## 2023-06-05 NOTE — Progress Notes (Signed)
[facility] Progress Note    PCP: Dr. Marcello Fennel Cardiology: Dr. Darrold Junker HF Cardiology: Dr. Shirlee Latch  52 y.o. with history of type 1 DM, HTN, CAD, chronic systolic CHF, severe asthma, OSA not adherent with CPAP.  Patient had anterior STEMI with DES to proximal LAD in 6/11.  Echo at the time with EF < 35%.  Patient eventually had [employer] ICD placed.  LV systolic function has been low since 6/11 MI.  He presented again in 2/18 with chest pain and dyspnea as well as anterior STE on ECG, but cath showed only moderate proximal LAD in-stent restenosis, not explaining his ECG changes.  Most recent echo in 1/23 showed EF 25-30%, normal RV, mild-moderate MR.   CPX 5/23>>Submaximal test. Moderate to severe functional limitation due to a combination of HF and moderate to severe restrictive lung physiology due to patient's body habitus. Chronotropic incompetence was noted. Suspect lung limitation predominates.   Diagnosed w/ COVID 8/23 but did not require hospitalization. Patient had COVID-19 again in 1/24.    Echo in 2/24 showed EF 35%, septal/apical akinesis, normal RV.   Echo in 3/24 as part of barostimulation activation therapy workup showed EF 30-35%, wall motion abnormalities consistent with LAD-territory MI, mild LV dilation, normal RV, normal IVC.   He returns for followup of CHF.  Weight is up 5 lbs.  He walks for exercise.  Occasional wheezing, has history of asthma.  He is short of breath walking up stairs, no dyspnea walking on flat ground. Recently noted dyspnea walking up to the top row of a movie theater.  Occasional nocturnal coughing but no orthopnea.  No chest pain.  Lightheadedness only if he stands too fast.   Labs (12/23): K 5.4, creatinine 1.48, glucose 508 (labs drawn in setting of marked hyperglycemia).  Labs (1/24): K 3.7, creatinine 1.28, LDL 65 Labs (3/24): K 4.7, creatinine 0.82, pro-BNP 166 Labs (4/24): K 4.1, creatinine 1.43, Lp(a) 63 Labs (5/24): K 4.3,  creatinine 1.68 Labs (6/24): creatinine 0.8  [employer] ICD interrogation: No VT, he does not have [employer].   PMH: 1. Type 1 diabetes: Diagnosis in 1997. H/o DKA.  2. GERD 3. Hyperlipidemia 4. HTN 5. OSA: Not adherent with CPAP 6. H/o seizure 7. CAD: 6/11 anterior MI with PCI to LAD.  - 2/18 STEMI by ECG => cath with moderate in-stent restenosis in the ostial/proximal LAD stent but did not explain ECG changes (?recanalized).   8. Chronic systolic CHF: Ischemic cardiomyopathy. [employer] ICD.  - Echo (6/11): EF < 35%.  - Echo (2017): EF 40% - Echo (2/18): EF 25-30% - Echo (1/23): EF 25-30%, normal RV, mild-moderate MR.  - CPX (5/23): VE/VCO2 slope 31, VO2 15.8, RER 1.0.  Submaximal. Probably mild HF limitation with further limitation due to deconditioning and moderate restriction by spirometry.  - Echo (2/24): EF 35%, septal/apical akinesis, normal RV.   - Echo (3/24): EF 30-35%, wall motion abnormalities consistent with LAD-territory MI, mild LV dilation, normal RV, normal IVC.  9. Severe asthma - Follows with pulmonary  Social History   Socioeconomic History   Marital status: Divorced    Spouse name: Not on file   Number of children: Not on file   Years of education: Not on file   Highest education level: Not on file  Occupational History   Occupation: Location manager  Tobacco Use   Smoking status: Never   Smokeless tobacco: Never  Vaping Use   Vaping status: Never Used  Substance and  Sexual Activity   Alcohol use: No   Drug use: No   Sexual activity: Yes  Other Topics Concern   Not on file  Social History Narrative   Not on file   Social Determinants of Health   Financial Resource Strain: Low Risk  (10/28/2022)   Received from [facility], [facility]   Overall Financial Resource Strain (CARDIA)    Difficulty of Paying Living Expenses: Not hard at all  Food Insecurity: No Food Insecurity (10/28/2022)    Received from [facility], [facility]   Hunger Vital Sign    Worried About Running Out of Food in the Last Year: Never true    Ran Out of Food in the Last Year: Never true  Transportation Needs: No Transportation Needs (10/28/2022)   Received from [facility], [facility]   [facility] - Transportation    In the past 12 months, has lack of transportation kept you from medical appointments or from getting medications?: No    Lack of Transportation (Non-Medical): No  Physical Activity: Not on file  Stress: Not on file  Social Connections: Not on file  Intimate Partner Violence: Not on file   Family History  Problem Relation Age of Onset   Hypertension Mother    Heart attack Paternal Grandmother    Heart attack Father    Bladder Cancer Neg Hx    Kidney cancer Neg Hx    Prostate cancer Neg Hx    ROS: all systems reviewed and negative except as per HPI.   Current Outpatient Medications  Medication Sig Dispense Refill   acetaminophen (TYLENOL) 500 MG tablet Take 500 mg by mouth every 6 (six) hours as needed for fever.     albuterol (PROVENTIL HFA) 108 (90 Base) MCG/ACT inhaler Inhale 2 puffs into the lungs every 4 (four) hours as needed for wheezing or shortness of breath. 1 Inhaler 0   aspirin EC 81 MG tablet Take 81 mg by mouth every morning.     atorvastatin (LIPITOR) 80 MG tablet TAKE 1 TABLET BY MOUTH EVERY DAY 90 tablet 1   bisoprolol (ZEBETA) 10 MG tablet Take 1 tablet (10 mg total) by mouth daily. 90 tablet 3   Cholecalciferol 25 MCG (1000 UT) capsule Take 1,000 Units by mouth daily.     digoxin (LANOXIN) 0.125 MG tablet TAKE 1 TABLET BY MOUTH DAILY 90 tablet 3   ENTRESTO 97-103 MG TAKE 1 TABLET BY MOUTH TWICE A DAY. STOP LOSARTAN 60 tablet 6   fluticasone (FLONASE) 50 MCG/ACT nasal spray Place 2 sprays into both nostrils daily as needed for allergies.      insulin aspart (NOVOLOG) 100 UNIT/ML FlexPen Inject  18-20-22 u TID AC plus sliding scale for up to 75 units TDD     insulin degludec (TRESIBA FLEXTOUCH) 200 UNIT/ML FlexTouch Pen Inject 60 Units into the skin daily.     Insulin Pen Needle (B-D ULTRAFINE III SHORT PEN) 31G X 8 MM MISC USE 5 TIMES DAILY AS  DIRECTED 75 each 0   KLOR-CON M10 10 MEQ tablet TAKE 1 TABLET BY MOUTH EVERY DAY 90 tablet 2   loratadine (CLARITIN) 10 MG tablet Take 10 mg by mouth daily as needed.      naproxen sodium (ALEVE) 220 MG tablet Take 220 mg by mouth daily as needed.     pantoprazole (PROTONIX) 20 MG tablet Take 20 mg by mouth as needed.  spironolactone (ALDACTONE) 25 MG tablet TAKE 1 TABLET (25 MG TOTAL) BY MOUTH DAILY. 90 tablet 3   SYMBICORT 160-4.5 MCG/ACT inhaler Inhale 2 puffs into the lungs 2 (two) times daily.     tadalafil (CIALIS) 20 MG tablet Take 1 tablet (20 mg total) by mouth daily as needed for erectile dysfunction. 30 tablet 11   torsemide (DEMADEX) 20 MG tablet Take 60 mg by mouth daily.     traMADol (ULTRAM) 50 MG tablet Take 50 mg by mouth every 4 (four) hours as needed.     glucagon, human recombinant, (GLUCAGEN) 1 MG injection Inject into the muscle as needed. (Patient not taking: Reported on 06/03/2023)     No current facility-administered medications for this visit.   BP 130/68   Pulse 67   Wt 216 lb (98 kg)   SpO2 97%   BMI 28.50 kg/m  PHYSICAL EXAM: General: NAD Neck: No JVD, no thyromegaly or thyroid nodule.  Lungs: Clear to auscultation bilaterally with normal respiratory effort. CV: Nondisplaced PMI.  Heart regular S1/S2, no S3/S4, no murmur.  No peripheral edema.  No carotid bruit.  Normal pedal pulses.  Abdomen: Soft, nontender, no hepatosplenomegaly, no distention.  Skin: Intact without lesions or rashes.  Neurologic: Alert and oriented x 3.  Psych: Normal affect. Extremities: No clubbing or cyanosis.  HEENT: Normal.   Assessment/Plan: 1. CAD: S/p anterior MI in 2011. Denies chest pain  - Continue ASA 81 daily.  -  Atorvastatin 80 mg, good lipids in 1/24.  2. Chronic systolic CHF: Ischemic cardiomyopathy with [employer] ICD.  EF has been low since 2011.  Most recent echo in 2/24 with EF 35%, normal RV. CPX 5/23 was a submaximal, mild HF limitation but additional functional limitation due to deconditioning and asthma.  NYHA class III chronically, not volume overloaded on exam.   - Continue torsemide 60 mg daily. BMET/BNP today. - Continue bisoprolol 10 mg daily.  - Continue spironolactone 25 mg daily.  - Continue digoxin, check level today.  - Continue Entresto 97/103 bid.   - No SGLT2 inhibitor due to type 1 diabetes.   - He was unable to tolerate low dose Bidil (0.5 tab tid) due to orthostatic symptoms. - He is interested in baroreceptor activation therapy, has seen Dr Myra Gianotti.  Waiting on insurance approval.  Will check with [facility] office.   - QRS not wide enough to benefit from CRT.  3. Type 1 diabetes: Following with Endocrine - Has not been well-controlled historically.  4. Asthma:  Severe per prior notes.  Not actively wheezing.  5. OSA: Needs to get back on CPAP, he needs repeat sleep study.  6. Hyperlipidemia: Check lipids today.      F/u 3 months   Marca Ancona,  06/05/2023

## 2023-06-06 ENCOUNTER — Telehealth: Payer: Self-pay

## 2023-06-06 DIAGNOSIS — I5022 Chronic systolic (congestive) heart failure: Secondary | ICD-10-CM

## 2023-06-06 NOTE — Telephone Encounter (Signed)
Spoke with pt to let him know of Dr. Shirlee Latch recommendations.  Pt stated that he was taking supplemental potassium . Pt was advised to stop taking this.  KLOR dc to reflect Dr. Shirlee Latch recommendations.    Referral to lipid clinic placed. BMET order placed.  Pt aware, agreeable, and verbalized understanding.    Laurey Morale, MD      Goal LDL < 55, he is above goal.  Would recommend referral to lipid clinic to get on Repatha.  K is mildly elevated, follow low K diet and make sure he is not on a KCl supplement.  BMET 10 days.

## 2023-06-08 ENCOUNTER — Telehealth: Payer: Self-pay

## 2023-06-08 DIAGNOSIS — N5231 Erectile dysfunction following radical prostatectomy: Secondary | ICD-10-CM | POA: Diagnosis not present

## 2023-06-08 DIAGNOSIS — Z4889 Encounter for other specified surgical aftercare: Secondary | ICD-10-CM | POA: Diagnosis not present

## 2023-06-08 NOTE — Telephone Encounter (Signed)
Short Term Disability paperwork completed and faxed to [employer] Dis[employer]: [phone] Called pt to make him aware.

## 2023-06-14 ENCOUNTER — Encounter: Payer: Self-pay | Admitting: Adult Health

## 2023-06-14 ENCOUNTER — Ambulatory Visit (INDEPENDENT_AMBULATORY_CARE_PROVIDER_SITE_OTHER): Payer: BC Managed Care – PPO | Admitting: Adult Health

## 2023-06-14 VITALS — BP 134/60 | HR 96 | Temp 97.8°F | Ht 73.0 in | Wt 214.4 lb

## 2023-06-14 DIAGNOSIS — I5022 Chronic systolic (congestive) heart failure: Secondary | ICD-10-CM | POA: Diagnosis not present

## 2023-06-14 DIAGNOSIS — J4550 Severe persistent asthma, uncomplicated: Secondary | ICD-10-CM

## 2023-06-14 DIAGNOSIS — G4733 Obstructive sleep apnea (adult) (pediatric): Secondary | ICD-10-CM

## 2023-06-14 MED ORDER — TRELEGY ELLIPTA 200-62.5-25 MCG/ACT IN AEPB: 1.0000 | INHALATION_SPRAY | Freq: Every day | RESPIRATORY_TRACT | 5 refills | Status: DC

## 2023-06-14 MED ORDER — TRELEGY ELLIPTA 200-62.5-25 MCG/ACT IN AEPB: 1.0000 | INHALATION_SPRAY | Freq: Every day | RESPIRATORY_TRACT | Status: DC

## 2023-06-14 NOTE — Assessment & Plan Note (Signed)
History of sleep apnea-off CPAP for a few years.  Patient has history of congestive heart failure.  Set up for in lab split-night sleep study  Plan  Patient Instructions  Change Symbicort to Trelegy 1 puff daily, rinse after use.  Set up PFTs  Take Claritin daily .  Albuterol inhaler as needed Flonase daily as needed.  Set up split night sleep study .  Healthy sleep regimen  Do not drive if sleepy.  Follow up in 3 months with Dr. Belia Heman or Hollister Wessler NP and As needed

## 2023-06-14 NOTE — Assessment & Plan Note (Signed)
Appears euvolemic on exam.  Continue on current maintenance regimen and follow-up with cardiology

## 2023-06-14 NOTE — Progress Notes (Signed)
@Patient  ID: Joseph [name], male    DOB: 1971/01/09, 52 y.o.   MRN: [id number]  Chief Complaint  Patient presents with   Follow-up    Referring provider: [facility]  HPI: 52 year old male never smoker seen for consult May 2024 for asthma and sleep apnea Medical history significant for congestive heart failure, ischemic cardiomyopathy, insulin-dependent diabetes  TEST/EVENTS :  RAST 11/27/19>> dust mite, grasses, mold, tree bark, ragweed; IgE 401 HP panel 11/27/19 >> negative PFT 02/06/20 >> FEV1 1.92 (55%), FEV1% 65, TLC 57%, DLCO 61% FeNO 02/10/23 >> 76   Chest Imaging:  CT chest 05/08/21 >> lungs clear   Sleep Tests:  HST 11/30/16 >> AHI 19, SpO2 low 88%   Cardiac Tests:  CPET 02/17/22 >> limitation due to CHF and obesity Echo 12/15/22 >> EF 30 to 35%, grade 2 DD, mild MR  06/14/2023 Follow up : Asthma and OSA  Patient returns for a 67-month follow-up.  Patient is followed for severe persistent asthma with allergic phenotype.   Feels that his asthma is not been as under control the last few months.  Has had some intermittent wheezing and coughing.  He remains on Symbicort twice daily.  Has some sinus drainage.  Takes Claritin and Flonase as needed.  Chest x-ray January 2024 showed clear lungs.  Patient was set up for PFTs which are still pending.  Patient has a history of moderate obstructive sleep apnea.  Was previously on CPAP but stopped wearing his machine after it broke.  He was set up for a home sleep study last visit but unfortunately has not been able to make the schedule.  He continues to have ongoing snoring and daytime sleepiness.  Has CHF followed by Cardiology .  Says overall lower extremity swelling has been about the same.  He remains on Entresto, Demadex and Aldactone.   Allergies  Allergen Reactions   Vancomycin Shortness Of Breath   Lisinopril Rash    Immunization History  Administered Date(s) Administered   Influenza,inj,Quad PF,6+ Mos 07/01/2017,  07/26/2019   Influenza-Unspecified 07/31/2012, 07/15/2014, 06/13/2015, 06/11/2022   Moderna SARS-COV2 Booster Vaccination 01/23/2021   Moderna Sars-Covid-2 Vaccination 02/20/2020, 03/22/2020    Past Medical History:  Diagnosis Date   AICD (automatic cardioverter/defibrillator) present 2011   Asthma    Cancer (HCC)    prostate   CHF (congestive heart failure) (HCC)    Coronary artery disease    Diabetes mellitus without complication (HCC)    Dyspnea    ED (erectile dysfunction)    GERD (gastroesophageal reflux disease)    Hyperlipidemia    Hypertension    Ischemic cardiomyopathy    LADA (latent autoimmune diabetes in adults), managed as type 1 (HCC)    Myocardial infarction (HCC) 2011   anterior MI at [facility] s/p BMS to ostial/proximal LAD   Neuromuscular disorder (HCC)    problems in arms. finger tips have been numb/tingling. voltaren works   Obstructive sleep apnea 2018   cpap. has not used lately. uses a breathe-right strip on nose   Seizures (HCC)    diabetes seizures (low blood sugar); last one 08/2018   Systolic heart failure (HCC)    Vitamin D deficiency     Tobacco History: Social History   Tobacco Use  Smoking Status Never  Smokeless Tobacco Never   Counseling given: Not Answered   Outpatient Medications Prior to Visit  Medication Sig Dispense Refill   acetaminophen (TYLENOL) 500 MG tablet Take 500 mg by mouth every 6 (six)  hours as needed for fever.     albuterol (PROVENTIL HFA) 108 (90 Base) MCG/ACT inhaler Inhale 2 puffs into the lungs every 4 (four) hours as needed for wheezing or shortness of breath. 1 Inhaler 0   aspirin EC 81 MG tablet Take 81 mg by mouth every morning.     atorvastatin (LIPITOR) 80 MG tablet TAKE 1 TABLET BY MOUTH EVERY DAY 90 tablet 1   bisoprolol (ZEBETA) 10 MG tablet Take 1 tablet (10 mg total) by mouth daily. 90 tablet 3   Cholecalciferol 25 MCG (1000 UT) capsule Take 1,000 Units by mouth daily.     digoxin (LANOXIN) 0.125 MG  tablet TAKE 1 TABLET BY MOUTH DAILY 90 tablet 3   ENTRESTO 97-103 MG TAKE 1 TABLET BY MOUTH TWICE A DAY. STOP LOSARTAN 60 tablet 6   fluticasone (FLONASE) 50 MCG/ACT nasal spray Place 2 sprays into both nostrils daily as needed for allergies.      glucagon, human recombinant, (GLUCAGEN) 1 MG injection Inject into the muscle as needed.     insulin aspart (NOVOLOG) 100 UNIT/ML FlexPen Inject 18-20-22 u TID AC plus sliding scale for up to 75 units TDD     insulin degludec (TRESIBA FLEXTOUCH) 200 UNIT/ML FlexTouch Pen Inject 60 Units into the skin daily.     Insulin Pen Needle (B-D ULTRAFINE III SHORT PEN) 31G X 8 MM MISC USE 5 TIMES DAILY AS  DIRECTED 75 each 0   loratadine (CLARITIN) 10 MG tablet Take 10 mg by mouth daily as needed.      naproxen sodium (ALEVE) 220 MG tablet Take 220 mg by mouth daily as needed.     pantoprazole (PROTONIX) 20 MG tablet Take 20 mg by mouth as needed.     spironolactone (ALDACTONE) 25 MG tablet TAKE 1 TABLET (25 MG TOTAL) BY MOUTH DAILY. 90 tablet 3   tadalafil (CIALIS) 20 MG tablet Take 1 tablet (20 mg total) by mouth daily as needed for erectile dysfunction. 30 tablet 11   torsemide (DEMADEX) 20 MG tablet Take 60 mg by mouth daily.     traMADol (ULTRAM) 50 MG tablet Take 50 mg by mouth every 4 (four) hours as needed.     SYMBICORT 160-4.5 MCG/ACT inhaler Inhale 2 puffs into the lungs 2 (two) times daily.     No facility-administered medications prior to visit.     Review of Systems:   Constitutional:   No  weight loss, night sweats,  Fevers, chills, + fatigue, or  lassitude.  HEENT:   No headaches,  Difficulty swallowing,  Tooth/dental problems, or  Sore throat,                No sneezing, itching, ear ache, nasal congestion, post nasal drip,   CV:  No chest pain,  Orthopnea, PND,  anasarca, dizziness, palpitations, syncope.   GI  No heartburn, indigestion, abdominal pain, nausea, vomiting, diarrhea, change in bowel habits, loss of appetite, bloody  stools.   Resp: .  No chest wall deformity  Skin: no rash or lesions.  GU: no dysuria, change in color of urine, no urgency or frequency.  No flank pain, no hematuria   MS:  No joint pain or swelling.  No decreased range of motion.  No back pain.    Physical Exam  BP 134/60 (BP Location: Left Arm, Cuff Size: Normal)   Pulse 96   Temp 97.8 F (36.6 C) (Temporal)   Ht 6\' 1"  (1.854 m)   Wt 214 lb  6.4 oz (97.3 kg)   SpO2 97%   BMI 28.29 kg/m   GEN: A/Ox3; pleasant , NAD, well nourished    HEENT:  [place]/AT,   NOSE-clear, THROAT-clear, no lesions, no postnasal drip or exudate noted. Class 3 MP airway   NECK:  Supple w/ fair ROM; no JVD; normal carotid impulses w/o bruits; no thyromegaly or nodules palpated; no lymphadenopathy.    RESP  Clear  P & A; w/o, wheezes/ rales/ or rhonchi. no accessory muscle use, no dullness to percussion  CARD:  RRR, no m/r/g, 1+ peripheral edema, pulses intact, no cyanosis or clubbing.  GI:   Soft & nt; nml bowel sounds; no organomegaly or masses detected.   Musco: Warm bil, no deformities or joint swelling noted.   Neuro: alert, no focal deficits noted.    Skin: Warm, no lesions or rashes    Lab Results:  CBC   BNP Imaging: No results found.  Administration History     None           No data to display          No results found for: "NITRICOXIDE"      Assessment & Plan:   Severe asthma without complication Moderate asthma with increased symptom burden.  Will change Symbicort to Trelegy.  Advised to use Claritin daily.  If continues to be symptomatic consider adding in Singulair. Check PFTs  Plan  Patient Instructions  Change Symbicort to Trelegy 1 puff daily, rinse after use.  Set up PFTs  Take Claritin daily .  Albuterol inhaler as needed Flonase daily as needed.  Set up split night sleep study .  Healthy sleep regimen  Do not drive if sleepy.  Follow up in 3 months with Dr. Belia Heman or Diania Co NP and As needed        OSA (obstructive sleep apnea) History of sleep apnea-off CPAP for a few years.  Patient has history of congestive heart failure.  Set up for in lab split-night sleep study  Plan  Patient Instructions  Change Symbicort to Trelegy 1 puff daily, rinse after use.  Set up PFTs  Take Claritin daily .  Albuterol inhaler as needed Flonase daily as needed.  Set up split night sleep study .  Healthy sleep regimen  Do not drive if sleepy.  Follow up in 3 months with Dr. Belia Heman or Kennice Finnie NP and As needed       Chronic systolic CHF (congestive heart failure) (HCC) Appears euvolemic on exam.  Continue on current maintenance regimen and follow-up with cardiology     Rubye Oaks, NP 06/14/2023

## 2023-06-14 NOTE — Patient Instructions (Addendum)
Change Symbicort to Trelegy 1 puff daily, rinse after use.  Set up PFTs  Take Claritin daily .  Albuterol inhaler as needed Flonase daily as needed.  Set up split night sleep study .  Healthy sleep regimen  Do not drive if sleepy.  Follow up in 3 months with Dr. Belia Heman or Marqual Mi NP and As needed

## 2023-06-14 NOTE — Assessment & Plan Note (Signed)
Moderate asthma with increased symptom burden.  Will change Symbicort to Trelegy.  Advised to use Claritin daily.  If continues to be symptomatic consider adding in Singulair. Check PFTs  Plan  Patient Instructions  Change Symbicort to Trelegy 1 puff daily, rinse after use.  Set up PFTs  Take Claritin daily .  Albuterol inhaler as needed Flonase daily as needed.  Set up split night sleep study .  Healthy sleep regimen  Do not drive if sleepy.  Follow up in 3 months with Dr. Belia Heman or [place] NP and As needed

## 2023-06-20 ENCOUNTER — Other Ambulatory Visit: Payer: Self-pay

## 2023-06-20 DIAGNOSIS — I5022 Chronic systolic (congestive) heart failure: Secondary | ICD-10-CM

## 2023-06-22 ENCOUNTER — Other Ambulatory Visit
Admission: RE | Admit: 2023-06-22 | Discharge: 2023-06-22 | Disposition: A | Discharge status: Home / Self Care | Payer: BC Managed Care – PPO | Source: Ambulatory Visit | Attending: Cardiology | Admitting: Cardiology

## 2023-06-22 DIAGNOSIS — I5022 Chronic systolic (congestive) heart failure: Secondary | ICD-10-CM | POA: Insufficient documentation

## 2023-06-22 LAB — BASIC METABOLIC PANEL
Anion gap: 13 (ref 5–15)
BUN: 24 mg/dL — ABNORMAL HIGH (ref 6–20)
CO2: 25 mmol/L (ref 22–32)
Calcium: 8.7 mg/dL — ABNORMAL LOW (ref 8.9–10.3)
Chloride: 97 mmol/L — ABNORMAL LOW (ref 98–111)
Creatinine, Ser: 1.11 mg/dL (ref 0.61–1.24)
GFR, Estimated: >60 mL/min (ref >60)
Glucose, Bld: 370 mg/dL — ABNORMAL HIGH (ref 70–99)
Potassium: 4.1 mmol/L (ref 3.5–5.1)
Sodium: 135 mmol/L (ref 135–145)

## 2023-06-28 ENCOUNTER — Ambulatory Visit: Payer: BC Managed Care – PPO | Attending: Otolaryngology

## 2023-06-28 ENCOUNTER — Telehealth: Payer: Self-pay

## 2023-06-28 DIAGNOSIS — G4733 Obstructive sleep apnea (adult) (pediatric): Secondary | ICD-10-CM | POA: Insufficient documentation

## 2023-06-28 DIAGNOSIS — E119 Type 2 diabetes mellitus without complications: Secondary | ICD-10-CM | POA: Insufficient documentation

## 2023-06-28 DIAGNOSIS — I509 Heart failure, unspecified: Secondary | ICD-10-CM | POA: Insufficient documentation

## 2023-06-28 DIAGNOSIS — J45909 Unspecified asthma, uncomplicated: Secondary | ICD-10-CM | POA: Diagnosis not present

## 2023-06-28 DIAGNOSIS — I110 Hypertensive heart disease with heart failure: Secondary | ICD-10-CM | POA: Diagnosis not present

## 2023-06-28 DIAGNOSIS — E669 Obesity, unspecified: Secondary | ICD-10-CM | POA: Diagnosis not present

## 2023-06-28 DIAGNOSIS — Z6828 Body mass index (BMI) 28.0-28.9, adult: Secondary | ICD-10-CM | POA: Insufficient documentation

## 2023-06-28 DIAGNOSIS — I5022 Chronic systolic (congestive) heart failure: Secondary | ICD-10-CM

## 2023-06-28 NOTE — Telephone Encounter (Signed)
Pt called regarding appt date.  Also stated that he has not received a call from Lipid disorders. Order place again.   Asked for the number of V&[facility] Dr. Francoise Schaumann, MD, FACS [facility] [phone]  Pt will have sleep study tonight >sleep study 06/28/23  Request to have order place again. >lipid clinic ref placed again 06/28/23 [facility] [phone]

## 2023-07-06 ENCOUNTER — Telehealth (HOSPITAL_BASED_OUTPATIENT_CLINIC_OR_DEPARTMENT_OTHER): Payer: BC Managed Care – PPO | Admitting: Pulmonary Disease

## 2023-07-06 DIAGNOSIS — G4731 Primary central sleep apnea: Secondary | ICD-10-CM

## 2023-07-06 NOTE — Telephone Encounter (Signed)
AHI 15/h , predominant central apneas -CSAindex 13/h Optimise heart failure therapy If Rx is desired, will need PAP titration

## 2023-07-08 DIAGNOSIS — L0231 Cutaneous abscess of buttock: Secondary | ICD-10-CM | POA: Diagnosis not present

## 2023-07-08 DIAGNOSIS — R300 Dysuria: Secondary | ICD-10-CM | POA: Diagnosis not present

## 2023-07-08 NOTE — Telephone Encounter (Signed)
Let him know he has moderate OSA , Please set up for CPAP titration study - ( previously ordered in lab study due to his hx of CHF but denied insurance) . Hopefully will be covered by insurance this time.   Rx CPAP titration , may transition to BIPAP if needed .

## 2023-07-12 ENCOUNTER — Telehealth: Payer: Self-pay | Admitting: Cardiology

## 2023-07-12 MED ORDER — TORSEMIDE 20 MG PO TABS: 60.0000 mg | ORAL_TABLET | Freq: Every day | ORAL | 5 refills | Status: AC

## 2023-07-12 NOTE — Progress Notes (Signed)
Pt refill sent to [facility] pharmacy.

## 2023-07-13 ENCOUNTER — Encounter: Payer: Self-pay | Admitting: *Deleted

## 2023-07-13 NOTE — Telephone Encounter (Signed)
ATC patient x1.  LVM to return call.  Advised I would send mychart message as well.

## 2023-07-15 ENCOUNTER — Ambulatory Visit: Payer: BC Managed Care – PPO | Attending: Cardiology | Admitting: Cardiology

## 2023-07-15 ENCOUNTER — Other Ambulatory Visit (HOSPITAL_COMMUNITY): Payer: Self-pay | Admitting: Physician Assistant

## 2023-07-15 VITALS — BP 113/65 | HR 66 | Wt 218.1 lb

## 2023-07-15 DIAGNOSIS — I5022 Chronic systolic (congestive) heart failure: Secondary | ICD-10-CM | POA: Diagnosis not present

## 2023-07-15 NOTE — Addendum Note (Signed)
Addended by: Delrae Rend on: 07/15/2023 11:26 AM   Modules accepted: Orders

## 2023-07-15 NOTE — Patient Instructions (Signed)
Routine lab work today. Will notify you of abnormal results  You have been referred to the lipid clinic They will contact you to schedule your appointment  Follow up in 3 months We will call you to schedule your appointment  Do the following things EVERYDAY: Weigh yourself in the morning before breakfast. Write it down and keep it in a log. Take your medicines as prescribed Eat low salt foods--Limit salt (sodium) to 2000 mg per day.  Stay as active as you can everyday Limit all fluids for the day to less than 2 liters

## 2023-07-16 LAB — BASIC METABOLIC PANEL
BUN/Creatinine Ratio: 13 (ref 9–20)
BUN: 19 mg/dL (ref 6–24)
CO2: 24 mmol/L (ref 20–29)
Calcium: 8.7 mg/dL (ref 8.7–10.2)
Chloride: 98 mmol/L (ref 96–106)
Creatinine, Ser: 1.49 mg/dL — ABNORMAL HIGH (ref 0.76–1.27)
Glucose: 202 mg/dL — ABNORMAL HIGH (ref 70–99)
Potassium: 4.1 mmol/L (ref 3.5–5.2)
Sodium: 136 mmol/L (ref 134–144)
eGFR: 56 mL/min/{1.73_m2} — ABNORMAL LOW (ref >59)

## 2023-07-16 LAB — DIGOXIN LEVEL: Digoxin, Serum: 0.7 ng/mL (ref 0.5–0.9)

## 2023-07-16 LAB — BRAIN NATRIURETIC PEPTIDE: BNP: 76.9 pg/mL (ref 0.0–100.0)

## 2023-07-17 NOTE — Progress Notes (Signed)
[facility] Progress Note    PCP: Dr. Marcello Fennel Cardiology: Dr. Darrold Junker HF Cardiology: Dr. Shirlee Latch  52 y.o. with history of type 1 DM, HTN, CAD, chronic systolic CHF, severe asthma, OSA not adherent with CPAP.  Patient had anterior STEMI with DES to proximal LAD in 6/11.  Echo at the time with EF < 35%.  Patient eventually had [employer] ICD placed.  LV systolic function has been low since 6/11 MI.  He presented again in 2/18 with chest pain and dyspnea as well as anterior STE on ECG, but cath showed only moderate proximal LAD in-stent restenosis, not explaining his ECG changes.  Most recent echo in 1/23 showed EF 25-30%, normal RV, mild-moderate MR.   CPX 5/23>>Submaximal test. Moderate to severe functional limitation due to a combination of HF and moderate to severe restrictive lung physiology due to patient's body habitus. Chronotropic incompetence was noted. Suspect lung limitation predominates.   Diagnosed w/ COVID 8/23 but did not require hospitalization. Patient had COVID-19 again in 1/24.    Echo in 2/24 showed EF 35%, septal/apical akinesis, normal RV.   Echo in 3/24 as part of barostimulation activation therapy workup showed EF 30-35%, wall motion abnormalities consistent with LAD-territory MI, mild LV dilation, normal RV, normal IVC.   He returns for followup of CHF.  Weight up 2 lbs.  He denies dyspnea walking on flat ground though he has dyspnea walking up stairs.  No chest pain.  No orthopnea/PND.  Now on Trelegy for his asthma.  No lightheadedness.  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   Labs (12/23): K 5.4, creatinine 1.48, glucose 508 (labs drawn in setting of marked hyperglycemia).  Labs (1/24): K 3.7, creatinine 1.28, LDL 65 Labs (3/24): K 4.7, creatinine 0.82, pro-BNP 166 Labs (4/24): K 4.1, creatinine 1.43, Lp(a) 63 Labs (5/24): K 4.3, creatinine 1.68 Labs (6/24): creatinine 0.8 Labs (8/24): LDL 89, BNP 53 Labs (9/24): K 4.4,  creatinine 1.11  PMH: 1. Type 1 diabetes: Diagnosis in 1997. H/o DKA.  2. GERD 3. Hyperlipidemia 4. HTN 5. OSA: Not adherent with CPAP 6. H/o seizure 7. CAD: 6/11 anterior MI with PCI to LAD.  - 2/18 STEMI by ECG => cath with moderate in-stent restenosis in the ostial/proximal LAD stent but did not explain ECG changes (?recanalized).   8. Chronic systolic CHF: Ischemic cardiomyopathy. [employer] ICD.  - Echo (6/11): EF < 35%.  - Echo (2017): EF 40% - Echo (2/18): EF 25-30% - Echo (1/23): EF 25-30%, normal RV, mild-moderate MR.  - CPX (5/23): VE/VCO2 slope 31, VO2 15.8, RER 1.0.  Submaximal. Probably mild HF limitation with further limitation due to deconditioning and moderate restriction by spirometry.  - Echo (2/24): EF 35%, septal/apical akinesis, normal RV.   - Echo (3/24): EF 30-35%, wall motion abnormalities consistent with LAD-territory MI, mild LV dilation, normal RV, normal IVC.  9. Severe asthma - Follows with pulmonary  Social History   Socioeconomic History   Marital status: Divorced    Spouse name: Not on file   Number of children: Not on file   Years of education: Not on file   Highest education level: Not on file  Occupational History   Occupation: Location manager  Tobacco Use   Smoking status: Never   Smokeless tobacco: Never  Vaping Use   Vaping status: Never Used  Substance and Sexual Activity   Alcohol use: No   Drug use: No   Sexual  activity: Yes  Other Topics Concern   Not on file  Social History Narrative   Not on file   Social Determinants of Health   Financial Resource Strain: Low Risk  (10/28/2022)   Received from [facility], [facility]   Overall Financial Resource Strain (CARDIA)    Difficulty of Paying Living Expenses: Not hard at all  Food Insecurity: No Food Insecurity (10/28/2022)   Received from [facility], [facility]   Hunger Vital Sign    Worried  About Running Out of Food in the Last Year: Never true    Ran Out of Food in the Last Year: Never true  Transportation Needs: No Transportation Needs (10/28/2022)   Received from [facility], [facility]   [facility] - Transportation    In the past 12 months, has lack of transportation kept you from medical appointments or from getting medications?: No    Lack of Transportation (Non-Medical): No  Physical Activity: Not on file  Stress: Not on file  Social Connections: Not on file  Intimate Partner Violence: Not on file   Family History  Problem Relation Age of Onset   Hypertension Mother    Heart attack Paternal Grandmother    Heart attack Father    Bladder Cancer Neg Hx    Kidney cancer Neg Hx    Prostate cancer Neg Hx    ROS: all systems reviewed and negative except as per HPI.   Current Outpatient Medications  Medication Sig Dispense Refill   acetaminophen (TYLENOL) 500 MG tablet Take 500 mg by mouth every 6 (six) hours as needed for fever.     albuterol (PROVENTIL HFA) 108 (90 Base) MCG/ACT inhaler Inhale 2 puffs into the lungs every 4 (four) hours as needed for wheezing or shortness of breath. 1 Inhaler 0   aspirin EC 81 MG tablet Take 81 mg by mouth every morning.     atorvastatin (LIPITOR) 80 MG tablet TAKE 1 TABLET BY MOUTH EVERY DAY 90 tablet 1   Cholecalciferol 25 MCG (1000 UT) capsule Take 1,000 Units by mouth daily.     digoxin (LANOXIN) 0.125 MG tablet TAKE 1 TABLET BY MOUTH DAILY 90 tablet 3   ENTRESTO 97-103 MG TAKE 1 TABLET BY MOUTH TWICE A DAY. STOP LOSARTAN 60 tablet 6   fluticasone (FLONASE) 50 MCG/ACT nasal spray Place 2 sprays into both nostrils daily as needed for allergies.      Fluticasone-Umeclidin-Vilant (TRELEGY ELLIPTA) 200-62.5-25 MCG/ACT AEPB Inhale 1 puff into the lungs daily. 1 each 5   Fluticasone-Umeclidin-Vilant (TRELEGY ELLIPTA) 200-62.5-25 MCG/ACT AEPB Inhale 1 puff into the lungs daily.     glucagon, human  recombinant, (GLUCAGEN) 1 MG injection Inject into the muscle as needed.     insulin aspart (NOVOLOG) 100 UNIT/ML FlexPen Inject 18-20-22 u TID AC plus sliding scale for up to 75 units TDD     insulin degludec (TRESIBA FLEXTOUCH) 200 UNIT/ML FlexTouch Pen Inject 60 Units into the skin daily.     Insulin Pen Needle (B-D ULTRAFINE III SHORT PEN) 31G X 8 MM MISC USE 5 TIMES DAILY AS  DIRECTED 75 each 0   loratadine (CLARITIN) 10 MG tablet Take 10 mg by mouth daily as needed.      naproxen sodium (ALEVE) 220 MG tablet Take 220 mg by mouth daily as needed.     pantoprazole (PROTONIX) 20 MG tablet Take 20 mg by mouth as needed.  spironolactone (ALDACTONE) 25 MG tablet TAKE 1 TABLET (25 MG TOTAL) BY MOUTH DAILY. 90 tablet 3   torsemide (DEMADEX) 20 MG tablet Take 3 tablets (60 mg total) by mouth daily. 90 tablet 5   traMADol (ULTRAM) 50 MG tablet Take 50 mg by mouth every 4 (four) hours as needed.     bisoprolol (ZEBETA) 10 MG tablet TAKE 1 TABLET BY MOUTH EVERY DAY 90 tablet 3   No current facility-administered medications for this visit.   BP 113/65   Pulse 66   Wt 218 lb 2 oz (98.9 kg)   SpO2 100%   BMI 28.78 kg/m  PHYSICAL EXAM: General: NAD Neck: No JVD, no thyromegaly or thyroid nodule.  Lungs: Clear to auscultation bilaterally with normal respiratory effort. CV: Nondisplaced PMI.  Heart regular S1/S2, no S3/S4, no murmur.  No peripheral edema.  No carotid bruit.  Normal pedal pulses.  Abdomen: Soft, nontender, no hepatosplenomegaly, no distention.  Skin: Intact without lesions or rashes.  Neurologic: Alert and oriented x 3.  Psych: Normal affect. Extremities: No clubbing or cyanosis.  HEENT: Normal.   Assessment/Plan: 1. CAD: S/p anterior MI in 2011. Denies chest pain  - Continue ASA 81 daily.  - Continue Atorvastatin 80 mg daily.  LDL too high in 8/24, refer to lipid clinic for Repatha.  2. Chronic systolic CHF: Ischemic cardiomyopathy with [employer] ICD.  EF has been  low since 2011.  Most recent echo in 2/24 with EF 35%, normal RV. CPX 5/23 was a submaximal, mild HF limitation but additional functional limitation due to deconditioning and asthma.  NYHA class III chronically, not volume overloaded on exam.   - Continue torsemide 60 mg daily. BMET/BNP today. - Continue bisoprolol 10 mg daily.  - Continue spironolactone 25 mg daily.  - Continue digoxin, check level today.  - Continue Entresto 97/103 bid.   - No SGLT2 inhibitor due to type 1 diabetes.   - He was unable to tolerate low dose Bidil (0.5 tab tid) due to orthostatic symptoms. - Waiting for insurance approval for barostimulator device.  Will check with [facility] office.   - QRS not wide enough to benefit from CRT.  3. Type 1 diabetes: Following with Endocrine - Has not been well-controlled historically.  4. Asthma:  Severe per prior notes.  Not actively wheezing.  5. OSA: Waiting to get CPAP.  6. Hyperlipidemia: As above, refer to lipid clinic for Repatha.       F/u 3 months   Marca Ancona,  07/17/2023

## 2023-07-18 ENCOUNTER — Encounter: Payer: Self-pay | Admitting: *Deleted

## 2023-07-21 ENCOUNTER — Encounter: Payer: Self-pay | Admitting: *Deleted

## 2023-07-21 NOTE — Progress Notes (Signed)
Per pt request letter and 10/4 OV note faxed to [employer] at [phone], claim # [id number]

## 2023-07-21 NOTE — Telephone Encounter (Signed)
ATC x2.  LVM to return call and that a mychart message had been sent as well.

## 2023-08-16 ENCOUNTER — Ambulatory Visit: Payer: BC Managed Care – PPO | Attending: Otolaryngology

## 2023-08-16 DIAGNOSIS — G4731 Primary central sleep apnea: Secondary | ICD-10-CM | POA: Diagnosis present

## 2023-08-23 ENCOUNTER — Telehealth (HOSPITAL_BASED_OUTPATIENT_CLINIC_OR_DEPARTMENT_OTHER): Payer: Self-pay | Admitting: Pulmonary Disease

## 2023-08-23 DIAGNOSIS — G4731 Primary central sleep apnea: Secondary | ICD-10-CM | POA: Diagnosis not present

## 2023-08-23 NOTE — Telephone Encounter (Signed)
Send order for CPAP auto 12 to 14cmH2o. New  Need ov in 2 months to follow up

## 2023-08-23 NOTE — Telephone Encounter (Signed)
Central sleep apnea with CHF Trial auto CPAP 12-14 cm & if centrals persist on DL then may need bipap

## 2023-08-23 NOTE — Telephone Encounter (Signed)
Will await Joseph [name]'s response.

## 2023-08-23 NOTE — Telephone Encounter (Signed)
I have notified the patient and placed an order for a new cpap machine.  Nothing further needed.

## 2023-08-23 NOTE — Addendum Note (Signed)
Addended by: Bonney Leitz on: 08/23/2023 04:53 PM   Modules accepted: Orders

## 2023-10-19 ENCOUNTER — Ambulatory Visit: Payer: BC Managed Care – PPO | Attending: Cardiology | Admitting: Cardiology

## 2023-10-19 ENCOUNTER — Encounter: Payer: Self-pay | Admitting: Cardiology

## 2023-10-19 VITALS — BP 117/53 | HR 74 | Wt 221.0 lb

## 2023-10-19 DIAGNOSIS — I5022 Chronic systolic (congestive) heart failure: Secondary | ICD-10-CM

## 2023-10-19 NOTE — Patient Instructions (Signed)
 There has been no changes to your medications.  Go DOWN to LOWER LEVEL (LL) to have your blood work completed inside of [employer] office.  We will only call you if the results are abnormal or if the provider would like to make medication changes.   Your physician recommends that you schedule a follow-up appointment in: 3 months (April) ** PLEASE CALL THE OFFICE IN MID FEBRUARY TO ARRANGE YOUR FOLLOW UP APPOINTMENT. **  At the [facility], you and your health needs are our priority. As part of our continuing mission to provide you with exceptional heart care, we have created designated Provider Care Teams. These Care Teams include your primary Cardiologist (physician) and Advanced Practice Providers (APPs- Physician Assistants and Nurse Practitioners) who all work together to provide you with the care you need, when you need it.   You may see any of the following providers on your designated Care Team at your next follow up: Dr Toribio Fuel Dr Ezra Shuck Dr. Ria Commander Dr. Morene Brownie Amy Lenetta, NP Caffie Shed, [place] [facility], [place] Beckey Coe, NP Jordan Lee, NP Ellouise Class, NP Jaun Bash, PharmD   Please be sure to bring in all your medications bottles to every appointment.    Thank you for choosing [facility]

## 2023-10-19 NOTE — Progress Notes (Signed)
 [facility] Progress Note    PCP: Dr. Sadie Cardiology: Dr. Ammon HF Cardiology: Dr. Rolan  53 y.o. with history of type 1 DM, HTN, CAD, chronic systolic CHF, severe asthma, OSA not adherent with CPAP.  Patient had anterior STEMI with DES to proximal LAD in 6/11.  Echo at the time with EF < 35%.  Patient eventually had [employer] ICD placed.  LV systolic function has been low since 6/11 MI.  He presented again in 2/18 with chest pain and dyspnea as well as anterior STE on ECG, but cath showed only moderate proximal LAD in-stent restenosis, not explaining his ECG changes.  Most recent echo in 1/23 showed EF 25-30%, normal RV, mild-moderate MR.   CPX 5/23>>Submaximal test. Moderate to severe functional limitation due to a combination of HF and moderate to severe restrictive lung physiology due to patient's body habitus. Chronotropic incompetence was noted. Suspect lung limitation predominates.   Diagnosed w/ COVID 8/23 but did not require hospitalization. Patient had COVID-19 again in 1/24.    Echo in 2/24 showed EF 35%, septal/apical akinesis, normal RV.   Echo in 3/24 as part of barostimulation activation therapy workup showed EF 30-35%, wall motion abnormalities consistent with LAD-territory MI, mild LV dilation, normal RV, normal IVC.   He returns for followup of CHF.  Weight up 3 lbs.  Using CPAP now though he finds the mask uncomfortable. Dyspnea if he walks fast or walks up stairs.  This is unchanged. No chest pain. No orthopnea/PND. No palpitations.   Labs (12/23): K 5.4, creatinine 1.48, glucose 508 (labs drawn in setting of marked hyperglycemia).  Labs (1/24): K 3.7, creatinine 1.28, LDL 65 Labs (3/24): K 4.7, creatinine 0.82, pro-BNP 166 Labs (4/24): K 4.1, creatinine 1.43, Lp(a) 63 Labs (5/24): K 4.3, creatinine 1.68 Labs (6/24): creatinine 0.8 Labs (8/24): LDL 89, BNP 53 Labs (9/24): K 4.4, creatinine 1.11 Labs (12/24): K 4.9, creatinine 1.3, LDL 89,  TGs 209  PMH: 1. Type 1 diabetes: Diagnosis in 1997. H/o DKA.  2. GERD 3. Hyperlipidemia 4. HTN 5. OSA: Has CPAP.  6. H/o seizure 7. CAD: 6/11 anterior MI with PCI to LAD.  - 2/18 STEMI by ECG => cath with moderate in-stent restenosis in the ostial/proximal LAD stent but did not explain ECG changes (?recanalized).   8. Chronic systolic CHF: Ischemic cardiomyopathy. [employer] ICD.  - Echo (6/11): EF < 35%.  - Echo (2017): EF 40% - Echo (2/18): EF 25-30% - Echo (1/23): EF 25-30%, normal RV, mild-moderate MR.  - CPX (5/23): VE/VCO2 slope 31, VO2 15.8, RER 1.0.  Submaximal. Probably mild HF limitation with further limitation due to deconditioning and moderate restriction by spirometry.  - Echo (2/24): EF 35%, septal/apical akinesis, normal RV.   - Echo (3/24): EF 30-35%, wall motion abnormalities consistent with LAD-territory MI, mild LV dilation, normal RV, normal IVC.  9. Severe asthma - Follows with pulmonary  Social History   Socioeconomic History   Marital status: Divorced    Spouse name: Not on file   Number of children: Not on file   Years of education: Not on file   Highest education level: Not on file  Occupational History   Occupation: location manager  Tobacco Use   Smoking status: Never   Smokeless tobacco: Never  Vaping Use   Vaping status: Never Used  Substance and Sexual Activity   Alcohol use: No   Drug use: No   Sexual activity: Yes  Other Topics Concern  Not on file  Social History Narrative   Not on file   Social Drivers of Health   Financial Resource Strain: Low Risk  (10/03/2023)   Received from [facility]   Overall Financial Resource Strain (CARDIA)    Difficulty of Paying Living Expenses: Not hard at all  Food Insecurity: No Food Insecurity (10/03/2023)   [facility] from [facility]   Hunger Vital Sign    Worried About Running Out of Food in the Last Year: Never true    Ran Out of Food in the Last  Year: Never true  Transportation Needs: No Transportation Needs (10/03/2023)   Received from [facility] - Transportation    In the past 12 months, has lack of transportation kept you from medical appointments or from getting medications?: No    Lack of Transportation (Non-Medical): No  Physical Activity: Not on file  Stress: Not on file  Social Connections: Not on file  Intimate Partner Violence: Not on file   Family History  Problem Relation Age of Onset   Hypertension Mother    Heart attack Paternal Grandmother    Heart attack Father    Bladder Cancer Neg Hx    Kidney cancer Neg Hx    Prostate cancer Neg Hx    ROS: all systems reviewed and negative except as per HPI.   Current Outpatient Medications  Medication Sig Dispense Refill   acetaminophen  (TYLENOL ) 500 MG tablet Take 500 mg by mouth every 6 (six) hours as needed for fever.     albuterol  (PROVENTIL  HFA) 108 (90 Base) MCG/ACT inhaler Inhale 2 puffs into the lungs every 4 (four) hours as needed for wheezing or shortness of breath. 1 Inhaler 0   aspirin  EC 81 MG tablet Take 81 mg by mouth every morning.     atorvastatin  (LIPITOR) 80 MG tablet TAKE 1 TABLET BY MOUTH EVERY DAY 90 tablet 1   bisoprolol  (ZEBETA ) 10 MG tablet TAKE 1 TABLET BY MOUTH EVERY DAY 90 tablet 3   Cholecalciferol  25 MCG (1000 UT) capsule Take 1,000 Units by mouth daily.     digoxin  (LANOXIN ) 0.125 MG tablet TAKE 1 TABLET BY MOUTH DAILY 90 tablet 3   ENTRESTO  97-103 MG TAKE 1 TABLET BY MOUTH TWICE A DAY. STOP LOSARTAN  60 tablet 6   fluticasone  (FLONASE ) 50 MCG/ACT nasal spray Place 2 sprays into both nostrils daily as needed for allergies.      Fluticasone -Umeclidin-Vilant (TRELEGY ELLIPTA ) 200-62.5-25 MCG/ACT AEPB Inhale 1 puff into the lungs daily. 1 each 5   Fluticasone -Umeclidin-Vilant (TRELEGY ELLIPTA ) 200-62.5-25 MCG/ACT AEPB Inhale 1 puff into the lungs daily.     glucagon, human recombinant, (GLUCAGEN) 1 MG injection  Inject into the muscle as needed.     insulin  aspart (NOVOLOG ) 100 UNIT/ML FlexPen Inject 18-20-22 u TID AC plus sliding scale for up to 75 units TDD     insulin  degludec (TRESIBA  FLEXTOUCH) 200 UNIT/ML FlexTouch Pen Inject 60 Units into the skin daily.     Insulin  Pen Needle (B-D ULTRAFINE III SHORT PEN) 31G X 8 MM MISC USE 5 TIMES DAILY AS  DIRECTED 75 each 0   loratadine  (CLARITIN ) 10 MG tablet Take 10 mg by mouth daily as needed.      naproxen sodium (ALEVE) 220 MG tablet Take 220 mg by mouth daily as needed.     pantoprazole  (PROTONIX ) 20 MG tablet Take 20 mg by mouth as needed.     spironolactone  (ALDACTONE )  25 MG tablet TAKE 1 TABLET (25 MG TOTAL) BY MOUTH DAILY. 90 tablet 3   torsemide  (DEMADEX ) 20 MG tablet Take 3 tablets (60 mg total) by mouth daily. 90 tablet 5   traMADol  (ULTRAM ) 50 MG tablet Take 50 mg by mouth every 4 (four) hours as needed.     No current facility-administered medications for this visit.   BP (!) 117/53   Pulse 74   Wt 221 lb (100.2 kg)   SpO2 98%   BMI 29.16 kg/m  PHYSICAL EXAM: General: NAD Neck: No JVD, no thyromegaly or thyroid nodule.  Lungs: Clear to auscultation bilaterally with normal respiratory effort. CV: Nondisplaced PMI.  Heart regular S1/S2, no S3/S4, no murmur.  No peripheral edema.  No carotid bruit.  Normal pedal pulses.  Abdomen: Soft, nontender, no hepatosplenomegaly, no distention.  Skin: Intact without lesions or rashes.  Neurologic: Alert and oriented x 3.  Psych: Normal affect. Extremities: No clubbing or cyanosis.  HEENT: Normal.   Assessment/Plan: 1. CAD: S/p anterior MI in 2011. Denies chest pain  - Continue ASA 81 daily.  - Continue Atorvastatin  80 mg daily.    2. Chronic systolic CHF: Ischemic cardiomyopathy with [employer] ICD.  EF has been low since 2011.  Most recent echo in 2/24 with EF 35%, normal RV. CPX 5/23 was a submaximal, mild HF limitation but additional functional limitation due to deconditioning and  asthma.  NYHA class III chronically, not volume overloaded on exam.   - Continue torsemide  60 mg daily. BMET/BNP today. - Continue bisoprolol  10 mg daily.  - Continue spironolactone  25 mg daily.  - Continue digoxin , check level today.  - Continue Entresto  97/103 bid.   - No SGLT2 inhibitor due to type 1 diabetes.   - He was unable to tolerate low dose Bidil (0.5 tab tid) due to orthostatic symptoms. - Insurance denied barostimulator placement.  He also would be a good candidate for a cardiac contractility modulator.  I will refer to EP (Dr. Cindie) to see if he can get coverage for this.    - QRS not wide enough to benefit from CRT.  3. Type 1 diabetes: Following with Endocrine - Has not been well-controlled historically.  4. Asthma:  Severe per prior notes.  Not actively wheezing.  5. OSA: Using CPAP.  6. Hyperlipidemia: Goal LDL < 55 LDL was 89 in 12/24.   - Continue atorvastatin  80 mg daily. - Add Zetia  10 mg daily. Lipids/LFTs in 2 months.        F/u 3 months   Ezra Shuck,  10/19/2023

## 2023-10-20 ENCOUNTER — Other Ambulatory Visit (HOSPITAL_COMMUNITY): Payer: Self-pay | Admitting: Cardiology

## 2023-10-20 LAB — BASIC METABOLIC PANEL
BUN/Creatinine Ratio: 14 (ref 9–20)
BUN: 18 mg/dL (ref 6–24)
CO2: 27 mmol/L (ref 20–29)
Calcium: 8.8 mg/dL (ref 8.7–10.2)
Chloride: 99 mmol/L (ref 96–106)
Creatinine, Ser: 1.33 mg/dL — ABNORMAL HIGH (ref 0.76–1.27)
Glucose: 298 mg/dL — ABNORMAL HIGH (ref 70–99)
Potassium: 4.5 mmol/L (ref 3.5–5.2)
Sodium: 141 mmol/L (ref 134–144)
eGFR: 64 mL/min/{1.73_m2} (ref >59)

## 2023-10-20 LAB — DIGOXIN LEVEL: Digoxin, Serum: 0.9 ng/mL (ref 0.5–0.9)

## 2023-10-20 NOTE — Progress Notes (Signed)
 Per patient request recent O/V note faxed to [employer] at [phone]

## 2023-11-07 ENCOUNTER — Telehealth (HOSPITAL_COMMUNITY): Payer: Self-pay

## 2023-11-07 NOTE — Telephone Encounter (Signed)
Requested forms signed and faxed to [employer] at [phone], per patients request. My chart message sent patient called and informed

## 2023-11-12 ENCOUNTER — Other Ambulatory Visit (HOSPITAL_COMMUNITY): Payer: Self-pay | Admitting: Cardiology

## 2023-11-15 NOTE — Telephone Encounter (Signed)
 Pt called to report [employer] Representative is missing pages from fax that was sent over  Representative to send another packet ,states only pages 2,3,4,5 are needed  Please call pt once forms are faxed   -confirmed fax was not received at [facility]-will route to team at [facility]

## 2023-11-16 NOTE — Telephone Encounter (Signed)
 Spoke with patient this am. He stated that the disability representative at [employer] stated they only needed most recent office note. Faxed office note per patient's request to the following 2 fax numbers [phone] and [phone]. Told patient to call [employer] later today to see if they received the documentation if not would re send

## 2023-11-24 ENCOUNTER — Other Ambulatory Visit: Payer: Self-pay

## 2023-11-25 ENCOUNTER — Other Ambulatory Visit: Payer: BC Managed Care – PPO

## 2023-11-25 DIAGNOSIS — C61 Malignant neoplasm of prostate: Secondary | ICD-10-CM

## 2023-11-26 LAB — PSA: Prostate Specific Ag, Serum: <0.1 ng/mL (ref 0.0–4.0)

## 2023-11-30 ENCOUNTER — Ambulatory Visit: Payer: Self-pay | Admitting: Urology

## 2023-12-06 NOTE — Progress Notes (Deleted)
  Electrophysiology Office Note:    Date:  12/06/2023   ID:  BRISTOL SOY, DOB 15-Aug-1971, MRN [id number]  [facility] Cardiologist:  None  [facility] Electrophysiologist:  Lanier Prude, MD   Referring MD: Laurey Morale, MD   Chief Complaint: Chronic systolic heart failure  History of Present Illness:    [name] is a 53 year old man who I am seeing today for an evaluation of his chronic systolic heart failure at the request of Dr. Shirlee Latch.  The patient is being considered for CCM therapy.  He has a history of diabetes, hypertension, coronary disease, chronic systolic heart failure, asthma, sleep apnea.  He had a prior STEMI in June 2011.  He had an ICD implanted.  He was referred initially for [facility] implant but his insurance company denied the request.      Their past medical, social and family history was reviewed.   ROS:   Please see the history of present illness.    All other systems reviewed and are negative.  EKGs/Labs/Other Studies Reviewed:    The following studies were reviewed today:  June 03, 2023 EKG shows sinus rhythm, QRS duration 124 ms.  December 16, 2022 echo EF 30 to 35% RV normal Mild MR       Physical Exam:    VS:  There were no vitals taken for this visit.    Wt Readings from Last 3 Encounters:  10/19/23 221 lb (100.2 kg)  07/15/23 218 lb 2 oz (98.9 kg)  06/14/23 214 lb 6.4 oz (97.3 kg)     GEN: no distress CARD: RRR, No MRG RESP: No IWOB. CTAB.        ASSESSMENT AND PLAN:    No diagnosis found.  #Chronic systolic heart failure NYHA class III.  Warm and dry on exam.  QRS is narrow making him not a candidate for CRT.  He is on good medical therapy.  I think he is a reasonable candidate for CCM therapy.  I discussed the pathophysiology of heart failure and how CCM therapy can provide benefit.  He would like to proceed if he is approved with his insurance company.    Risks, benefits, alternatives to CCM  implantation were discussed in detail with the patient today. The patient understands that the risks include but are not limited to bleeding, infection, pneumothorax, perforation, tamponade, vascular damage, renal failure, MI, stroke, death, and lead dislodgement and wishes to proceed.  We will therefore schedule device implantation at the next available time.  #ICD in situ Device functioning appropriately.  Continue remote monitoring We will transfer his remotes to our clinic***   Signed, Sheria Lang T. Lalla Brothers, MD, [facility], [facility] 12/06/2023 9:15 PM    Electrophysiology [facility]

## 2023-12-07 ENCOUNTER — Ambulatory Visit: Payer: BC Managed Care – PPO | Admitting: Cardiology

## 2023-12-07 DIAGNOSIS — I5022 Chronic systolic (congestive) heart failure: Secondary | ICD-10-CM

## 2023-12-07 DIAGNOSIS — Z95810 Presence of automatic (implantable) cardiac defibrillator: Secondary | ICD-10-CM

## 2023-12-07 DIAGNOSIS — I255 Ischemic cardiomyopathy: Secondary | ICD-10-CM

## 2023-12-22 ENCOUNTER — Ambulatory Visit: Payer: BC Managed Care – PPO | Admitting: Urology

## 2023-12-31 ENCOUNTER — Other Ambulatory Visit (HOSPITAL_COMMUNITY): Payer: Self-pay | Admitting: Cardiology

## 2024-01-03 NOTE — Progress Notes (Deleted)
  Electrophysiology Office Note:    Date:  01/03/2024   ID:  Joseph [name], DOB 03/05/1971, MRN [id number]  [facility] Cardiologist:  None  [facility] Electrophysiologist:  Lanier Prude, MD   Referring MD: Laurey Morale, MD   Chief Complaint: Chronic systolic heart failure  History of Present Illness:    [name] is a 53 year old man who I am seeing today for an evaluation of his chronic systolic heart failure at the request of Dr. Shirlee Latch.  The patient is being considered for CCM therapy.  He has a history of diabetes, hypertension, coronary disease, chronic systolic heart failure, asthma, sleep apnea.  He had a prior STEMI in June 2011.  He had an ICD implanted.  He was referred initially for [employer] implant but his insurance company denied the request.      Their past medical, social and family history was reviewed.   ROS:   Please see the history of present illness.    All other systems reviewed and are negative.  EKGs/Labs/Other Studies Reviewed:    The following studies were reviewed today:  June 03, 2023 EKG shows sinus rhythm, QRS duration 124 ms.  December 16, 2022 echo EF 30 to 35% RV normal Mild MR       Physical Exam:    VS:  There were no vitals taken for this visit.    Wt Readings from Last 3 Encounters:  10/19/23 221 lb (100.2 kg)  07/15/23 218 lb 2 oz (98.9 kg)  06/14/23 214 lb 6.4 oz (97.3 kg)     GEN: no distress CARD: RRR, No MRG RESP: No IWOB. CTAB.        ASSESSMENT AND PLAN:    No diagnosis found.  #Chronic systolic heart failure NYHA class III.  Warm and dry on exam.  QRS is narrow making him not a candidate for CRT.  He is on good medical therapy.  I think he is a reasonable candidate for CCM therapy.  I discussed the pathophysiology of heart failure and how CCM therapy can provide benefit.  He would like to proceed if he is approved with his insurance company.    Risks, benefits, alternatives to CCM  implantation were discussed in detail with the patient today. The patient understands that the risks include but are not limited to bleeding, infection, pneumothorax, perforation, tamponade, vascular damage, renal failure, MI, stroke, death, and lead dislodgement and wishes to proceed.  We will therefore schedule device implantation at the next available time.  #ICD in situ Device functioning appropriately.  Continue remote monitoring We will transfer his remotes to our clinic***   Signed, Sheria Lang T. Lalla Brothers, MD, [facility], [facility] 01/03/2024 10:35 PM    Electrophysiology [facility]

## 2024-01-04 ENCOUNTER — Ambulatory Visit: Payer: Self-pay | Attending: Cardiology | Admitting: Cardiology

## 2024-01-04 DIAGNOSIS — I5022 Chronic systolic (congestive) heart failure: Secondary | ICD-10-CM

## 2024-01-04 DIAGNOSIS — Z95810 Presence of automatic (implantable) cardiac defibrillator: Secondary | ICD-10-CM

## 2024-01-06 ENCOUNTER — Other Ambulatory Visit (HOSPITAL_COMMUNITY): Payer: Self-pay | Admitting: Cardiology

## 2024-01-09 ENCOUNTER — Other Ambulatory Visit: Payer: Self-pay | Admitting: Cardiology

## 2024-01-09 NOTE — Telephone Encounter (Signed)
 Pt request Torsemide and [facility]leg[facility] refills [facility] [address]

## 2024-01-11 ENCOUNTER — Telehealth: Payer: Self-pay | Admitting: Adult Health

## 2024-01-11 ENCOUNTER — Encounter: Admitting: Cardiology

## 2024-01-11 NOTE — Telephone Encounter (Signed)
 Pt is scheduled for a [facility] appointment tomorrow with Kasa. Pt states he has been out of his Trelegy for 2 wks. Asking for a refill

## 2024-01-11 NOTE — Telephone Encounter (Signed)
 NA. Voicemail is full. Trying to verify pharmacy.

## 2024-01-12 ENCOUNTER — Encounter: Admitting: Internal Medicine

## 2024-01-12 MED ORDER — TRELEGY ELLIPTA 200-62.5-25 MCG/ACT IN AEPB: 1.0000 | INHALATION_SPRAY | Freq: Every day | RESPIRATORY_TRACT | 0 refills | Status: DC

## 2024-01-12 NOTE — Progress Notes (Deleted)
 [facility]     Past Surgical History:  He  has a past surgical history that includes Cardiac defibrillator placement (08/30/2010); LEFT HEART CATH AND CORONARY ANGIOGRAPHY (N/A, 11/23/2016); Esophagogastroduodenoscopy (egd) with propofol (N/A, 03/20/2018); Coronary angioplasty (2011); Cholecystectomy (N/A, 11/07/2017); Prostate biopsy (2019); Prostate biopsy (N/A, 10/23/2019); Transrectal ultrasound (N/A, 10/23/2019); Wrist surgery (Right, 2008); Robot assisted laparoscopic radical prostatectomy (N/A, 01/03/2020); Cystoscopy (N/A, 01/03/2020); and Colonoscopy with propofol (N/A, 03/04/2023).  Past Medical History:  Prostate cancer, CHF, CAD, DM type 2, ED, GERD, HLD, HTN, Seizures, Neuropathy   Brief Summary:  Joseph [name] is a 53 y.o. male with asthma and obstructive sleep apnea.      Subjective:   He was seen previously by Dr. Karna Christmas and Dr. Sung Amabile.  He has persistent cough with clear sputum.  He gets sinus congestion with post nasal drip.  These are worse in the morning.  He has only been using symbicort in the morning.  He didn't realize he needed to use this twice per day.  He uses albuterol 3 to 5 times per day.  This helps, but doesn't last.  He gets episodes of wheezing.  He doesn't smoke cigarettes.  He had influenza and COVID pneumonia before.  Chest xray from 11/08/22 was unremarkable.  His sleep study from 2018 showed moderate sleep apnea.  He had CPAP, but mask broke and he wasn't able to get replacement supplies.  It has been about 3 years since he last used CPAP.  He felt that CPAP helped when he was able to use this.  He has snoring and stops breathing at night.  He gets sleepy during the day and falls asleep sometimes when watching TV.  Physical Exam:   Appearance - well kempt   ENMT - no sinus tenderness, no oral exudate, no LAN, Mallampati 3 airway, no stridor  Respiratory - equal breath sounds bilaterally, no wheezing or  rales  CV - s1s2 regular rate and rhythm, no murmurs  Ext - no clubbing, no edema  Skin - no rashes  Psych - normal mood and affect   Pulmonary testing:  RAST 11/27/19>> dust mite, grasses, mold, tree bark, ragweed; IgE 401 HP panel 11/27/19 >> negative PFT 02/06/20 >> FEV1 1.92 (55%), FEV1% 65, TLC 57%, DLCO 61% FeNO 02/10/23 >> 76  Chest Imaging:  CT chest 05/08/21 >> lungs clear  Sleep Tests:  HST 11/30/16 >> AHI 19, SpO2 low 88%  Cardiac Tests:  CPET 02/17/22 >> limitation due to CHF and obesity Echo 12/15/22 >> EF 30 to 35%, grade 2 DD, mild MR  Social History:  He  reports that he has never smoked. He has never used smokeless tobacco. He reports that he does not drink alcohol and does not use drugs.  Family History:  His family history includes Heart attack in his father and paternal grandmother; Hypertension in his mother.     Assessment/Plan:   Severe, persistent asthma with allergic phenotype. - advised him he needs to use symbicort twice per day to get maximum benefit - continue albuterol prn - if symptoms persist, then consider adding a LAMA and/or leukotriene inhibitor  Obstructive sleep apnea. - he has persistent snoring, sleep disruption, apnea, and daytime sleepiness - he likely still has significant obstructive sleep apnea - will arrange for a home sleep study to assess current status  CAD, HFrEF. - followed by Dr. Marca Ancona with heart failure team  Time Spent Involved in Patient Care on Day  of Examination:  52 minutes  Follow up:     Medication List:   Allergies as of 01/12/2024       Reactions   Vancomycin Shortness Of Breath   Lisinopril Rash        Medication List        Accurate as of January 12, 2024  8:07 AM. If you have any questions, ask your nurse or doctor.          acetaminophen 500 MG tablet Commonly known as: TYLENOL Take 500 mg by mouth every 6 (six) hours as needed for fever.   albuterol 108 (90 Base) MCG/ACT  inhaler Commonly known as: Proventil HFA Inhale 2 puffs into the lungs every 4 (four) hours as needed for wheezing or shortness of breath.   aspirin EC 81 MG tablet Take 81 mg by mouth every morning.   atorvastatin 80 MG tablet Commonly known as: LIPITOR TAKE 1 TABLET BY MOUTH EVERY DAY   B-D ULTRAFINE III SHORT PEN 31G X 8 MM Misc Generic drug: Insulin Pen Needle USE 5 TIMES DAILY AS  DIRECTED   bisoprolol 10 MG tablet Commonly known as: ZEBETA TAKE 1 TABLET BY MOUTH EVERY DAY   Cholecalciferol 25 MCG (1000 UT) capsule Take 1,000 Units by mouth daily.   digoxin 0.125 MG tablet Commonly known as: LANOXIN TAKE 1 TABLET BY MOUTH EVERY DAY   Entresto 97-103 MG Generic drug: sacubitril-valsartan TAKE 1 TABLET BY MOUTH TWICE A DAY. STOP LOSARTAN   fluticasone 50 MCG/ACT nasal spray Commonly known as: FLONASE Place 2 sprays into both nostrils daily as needed for allergies.   glucagon (human recombinant) 1 MG injection Commonly known as: GLUCAGEN Inject into the muscle as needed.   insulin aspart 100 UNIT/ML FlexPen Commonly known as: NOVOLOG Inject 18-20-22 u TID AC plus sliding scale for up to 75 units TDD   loratadine 10 MG tablet Commonly known as: CLARITIN Take 10 mg by mouth daily as needed.   naproxen sodium 220 MG tablet Commonly known as: ALEVE Take 220 mg by mouth daily as needed.   pantoprazole 20 MG tablet Commonly known as: PROTONIX Take 20 mg by mouth as needed.   spironolactone 25 MG tablet Commonly known as: ALDACTONE TAKE 1 TABLET (25 MG TOTAL) BY MOUTH DAILY.   torsemide 20 MG tablet Commonly known as: DEMADEX Take 3 tablets (60 mg total) by mouth daily.   traMADol 50 MG tablet Commonly known as: ULTRAM Take 50 mg by mouth every 4 (four) hours as needed.   Trelegy Ellipta 200-62.5-25 MCG/ACT Aepb Generic drug: Fluticasone-Umeclidin-Vilant Inhale 1 puff into the lungs daily.   Trelegy Ellipta 200-62.5-25 MCG/ACT Aepb Generic drug:  Fluticasone-Umeclidin-Vilant Inhale 1 puff into the lungs daily.   Evaristo Bury FlexTouch 200 UNIT/ML FlexTouch Pen Generic drug: insulin degludec Inject 60 Units into the skin daily.        MEDICATION ADJUSTMENTS/LABS AND TESTS ORDERED:    CURRENT MEDICATIONS REVIEWED AT LENGTH WITH PATIENT TODAY   Patient  satisfied with Plan of action and management. All questions answered   Follow up    I spent a total of **** minutes reviewing chart data, face-to-face evaluation with the patient, counseling and coordination of care as detailed above.      Lucie Leather, M.D.  [facility]  Medical Director [facility] Medical Director [facility] Cardio-Pulmonary Department

## 2024-01-12 NOTE — Telephone Encounter (Signed)
 [facility] Advised to keep fu appt for future refills. Nothing further needed.

## 2024-01-19 DIAGNOSIS — E139 Other specified diabetes mellitus without complications: Secondary | ICD-10-CM | POA: Diagnosis not present

## 2024-01-19 DIAGNOSIS — R7309 Other abnormal glucose: Secondary | ICD-10-CM | POA: Diagnosis not present

## 2024-01-31 ENCOUNTER — Telehealth: Payer: Self-pay | Admitting: Cardiology

## 2024-01-31 NOTE — Telephone Encounter (Signed)
 Called to confirm/remind patient of their appointment at the [facility] on 02/01/24.   Appointment:   [] Confirmed  [x] Left mess   [] No answer/No voice mail  [] VM Full/unable to leave message  [] Phone not in service  Patient reminded to bring all medications and/or complete list.  Confirmed patient has transportation. Gave directions, instructed to utilize valet parking.

## 2024-02-01 ENCOUNTER — Telehealth: Payer: Self-pay

## 2024-02-01 ENCOUNTER — Ambulatory Visit: Attending: Cardiology | Admitting: Cardiology

## 2024-02-01 ENCOUNTER — Encounter: Payer: Self-pay | Admitting: Cardiology

## 2024-02-01 VITALS — BP 119/64 | HR 69 | Wt 217.4 lb

## 2024-02-01 DIAGNOSIS — Z794 Long term (current) use of insulin: Secondary | ICD-10-CM | POA: Diagnosis not present

## 2024-02-01 DIAGNOSIS — J45909 Unspecified asthma, uncomplicated: Secondary | ICD-10-CM | POA: Diagnosis not present

## 2024-02-01 DIAGNOSIS — E785 Hyperlipidemia, unspecified: Secondary | ICD-10-CM | POA: Diagnosis not present

## 2024-02-01 DIAGNOSIS — R0602 Shortness of breath: Secondary | ICD-10-CM | POA: Diagnosis not present

## 2024-02-01 DIAGNOSIS — Z7982 Long term (current) use of aspirin: Secondary | ICD-10-CM | POA: Insufficient documentation

## 2024-02-01 DIAGNOSIS — I252 Old myocardial infarction: Secondary | ICD-10-CM | POA: Insufficient documentation

## 2024-02-01 DIAGNOSIS — Z8616 Personal history of COVID-19: Secondary | ICD-10-CM | POA: Insufficient documentation

## 2024-02-01 DIAGNOSIS — Z79899 Other long term (current) drug therapy: Secondary | ICD-10-CM | POA: Insufficient documentation

## 2024-02-01 DIAGNOSIS — I2510 Atherosclerotic heart disease of native coronary artery without angina pectoris: Secondary | ICD-10-CM | POA: Insufficient documentation

## 2024-02-01 DIAGNOSIS — I5022 Chronic systolic (congestive) heart failure: Secondary | ICD-10-CM

## 2024-02-01 DIAGNOSIS — I110 Hypertensive heart disease with heart failure: Secondary | ICD-10-CM | POA: Insufficient documentation

## 2024-02-01 DIAGNOSIS — Z95810 Presence of automatic (implantable) cardiac defibrillator: Secondary | ICD-10-CM | POA: Insufficient documentation

## 2024-02-01 DIAGNOSIS — I255 Ischemic cardiomyopathy: Secondary | ICD-10-CM | POA: Diagnosis not present

## 2024-02-01 DIAGNOSIS — G4733 Obstructive sleep apnea (adult) (pediatric): Secondary | ICD-10-CM | POA: Diagnosis not present

## 2024-02-01 DIAGNOSIS — E1065 Type 1 diabetes mellitus with hyperglycemia: Secondary | ICD-10-CM | POA: Insufficient documentation

## 2024-02-01 MED ORDER — EZETIMIBE 10 MG PO TABS: 10.0000 mg | ORAL_TABLET | Freq: Every day | ORAL | 3 refills | Status: DC

## 2024-02-01 NOTE — Telephone Encounter (Signed)
 Per Jenine Mix at [employer], I called to ask patient if his Estel Heir is inactive or not.  NO answer.  I left a voice message requesting a return call.

## 2024-02-01 NOTE — Progress Notes (Signed)
 [facility] Progress Note    PCP: Dr. Kevan Peers Cardiology: Dr. Parks Bollman HF Cardiology: Dr. Mitzie Anda  Chief complaint: CHF  53 y.o. with history of type 1 DM, HTN, CAD, chronic systolic CHF, severe asthma, OSA not adherent with CPAP.  Patient had anterior STEMI with DES to proximal LAD in 6/11.  Echo at the time with EF < 35%.  Patient eventually had [employer] ICD placed.  LV systolic function has been low since 6/11 MI.  He presented again in 2/18 with chest pain and dyspnea as well as anterior STE on ECG, but cath showed only moderate proximal LAD in-stent restenosis, not explaining his ECG changes.  Most recent echo in 1/23 showed EF 25-30%, normal RV, mild-moderate MR.   CPX 5/23>>Submaximal test. Moderate to severe functional limitation due to a combination of HF and moderate to severe restrictive lung physiology due to patient's body habitus. Chronotropic incompetence was noted. Suspect lung limitation predominates.   Diagnosed w/ COVID 8/23 but did not require hospitalization. Patient had COVID-19 again in 1/24.    Echo in 2/24 showed EF 35%, septal/apical akinesis, normal RV.   Echo in 3/24 as part of barostimulation activation therapy workup showed EF 30-35%, wall motion abnormalities consistent with LAD-territory MI, mild LV dilation, normal RV, normal IVC.   He returns for followup of CHF.  Not using CPAP, mask is too uncomfortable. Weight down 4 lbs. He gets short of breath with yardwork or heavy lifting.  No dyspnea walking on flat ground.  No chest pain.  He has been congested recently with some coughing and wheezing. No orthopnea/PND.   ECG (personally reviewed): NSR, RBBB  REDS clip 30%  Labs (12/23): K 5.4, creatinine 1.48, glucose 508 (labs drawn in setting of marked hyperglycemia).  Labs (1/24): K 3.7, creatinine 1.28, LDL 65 Labs (3/24): K 4.7, creatinine 0.82, pro-BNP 166 Labs (4/24): K 4.1, creatinine 1.43, Lp(a) 63 Labs (5/24): K 4.3, creatinine  1.68 Labs (6/24): creatinine 0.8 Labs (8/24): LDL 89, BNP 53 Labs (9/24): K 4.4, creatinine 1.11 Labs (12/24): K 4.9, creatinine 1.3, LDL 89, TGs 209 Labs (1/25): K 4.5, creatinine 1.33  PMH: 1. Type 1 diabetes: Diagnosis in 1997. H/o DKA.  2. GERD 3. Hyperlipidemia 4. HTN 5. OSA: Has CPAP.  6. H/o seizure 7. CAD: 6/11 anterior MI with PCI to LAD.  - 2/18 STEMI by ECG => cath with moderate in-stent restenosis in the ostial/proximal LAD stent but did not explain ECG changes (?recanalized).   8. Chronic systolic CHF: Ischemic cardiomyopathy. [employer] ICD.  - Echo (6/11): EF < 35%.  - Echo (2017): EF 40% - Echo (2/18): EF 25-30% - Echo (1/23): EF 25-30%, normal RV, mild-moderate MR.  - CPX (5/23): VE/VCO2 slope 31, VO2 15.8, RER 1.0.  Submaximal. Probably mild HF limitation with further limitation due to deconditioning and moderate restriction by spirometry.  - Echo (2/24): EF 35%, septal/apical akinesis, normal RV.   - Echo (3/24): EF 30-35%, wall motion abnormalities consistent with LAD-territory MI, mild LV dilation, normal RV, normal IVC.  9. Severe asthma - Follows with pulmonary  Social History   Socioeconomic History   Marital status: Divorced    Spouse name: Not on file   Number of children: Not on file   Years of education: Not on file   Highest education level: Not on file  Occupational History   Occupation: Location manager  Tobacco Use   Smoking status: Never   Smokeless tobacco: Never  Vaping Use  Vaping status: Never Used  Substance and Sexual Activity   Alcohol use: No   Drug use: No   Sexual activity: Yes  Other Topics Concern   Not on file  Social History Narrative   Not on file   Social Drivers of Health   Financial Resource Strain: Low Risk  (01/19/2024)   Received from [facility]   Overall Financial Resource Strain (CARDIA)    Difficulty of Paying Living Expenses: Not hard at all  Food Insecurity: No Food Insecurity  (01/19/2024)   Received from [facility]   Hunger Vital Sign    Worried About Running Out of Food in the Last Year: Never true    Ran Out of Food in the Last Year: Never true  Transportation Needs: No Transportation Needs (01/19/2024)   Received from [facility] - Transportation    In the past 12 months, has lack of transportation kept you from medical appointments or from getting medications?: No    Lack of Transportation (Non-Medical): No  Physical Activity: Not on file  Stress: Not on file  Social Connections: Not on file  Intimate Partner Violence: Not on file   Family History  Problem Relation Age of Onset   Hypertension Mother    Heart attack Paternal Grandmother    Heart attack Father    Bladder Cancer Neg Hx    Kidney cancer Neg Hx    Prostate cancer Neg Hx    ROS: all systems reviewed and negative except as per HPI.   Current Outpatient Medications  Medication Sig Dispense Refill   acetaminophen  (TYLENOL ) 500 MG tablet Take 500 mg by mouth every 6 (six) hours as needed for fever.     albuterol  (PROVENTIL  HFA) 108 (90 Base) MCG/ACT inhaler Inhale 2 puffs into the lungs every 4 (four) hours as needed for wheezing or shortness of breath. 1 Inhaler 0   aspirin  EC 81 MG tablet Take 81 mg by mouth every morning.     atorvastatin  (LIPITOR) 80 MG tablet TAKE 1 TABLET BY MOUTH EVERY DAY 90 tablet 2   bisoprolol  (ZEBETA ) 10 MG tablet TAKE 1 TABLET BY MOUTH EVERY DAY 90 tablet 3   Cholecalciferol  25 MCG (1000 UT) capsule Take 1,000 Units by mouth daily.     digoxin  (LANOXIN ) 0.125 MG tablet TAKE 1 TABLET BY MOUTH EVERY DAY 90 tablet 0   ENTRESTO  97-103 MG TAKE 1 TABLET BY MOUTH TWICE A DAY. STOP LOSARTAN  60 tablet 6   ezetimibe  (ZETIA ) 10 MG tablet Take 1 tablet (10 mg total) by mouth daily. 90 tablet 3   fluticasone  (FLONASE ) 50 MCG/ACT nasal spray Place 2 sprays into both nostrils daily as needed for allergies.       Fluticasone -Umeclidin-Vilant (TRELEGY ELLIPTA ) 200-62.5-25 MCG/ACT AEPB Inhale 1 puff into the lungs daily.     Fluticasone -Umeclidin-Vilant (TRELEGY ELLIPTA ) 200-62.5-25 MCG/ACT AEPB Inhale 1 puff into the lungs daily. 28 each 0   glucagon, human recombinant, (GLUCAGEN) 1 MG injection Inject into the muscle as needed.     insulin  aspart (NOVOLOG ) 100 UNIT/ML FlexPen Inject 18-20-22 u TID AC plus sliding scale for up to 75 units TDD     insulin  degludec (TRESIBA  FLEXTOUCH) 200 UNIT/ML FlexTouch Pen Inject 60 Units into the skin daily.     Insulin  Pen Needle (B-D ULTRAFINE III SHORT PEN) 31G X 8 MM MISC USE 5 TIMES DAILY AS  DIRECTED 75 each 0   loratadine  (CLARITIN ) 10 MG  tablet Take 10 mg by mouth daily as needed.      naproxen sodium (ALEVE) 220 MG tablet Take 220 mg by mouth daily as needed.     pantoprazole  (PROTONIX ) 20 MG tablet Take 20 mg by mouth as needed.     spironolactone  (ALDACTONE ) 25 MG tablet TAKE 1 TABLET (25 MG TOTAL) BY MOUTH DAILY. 90 tablet 3   torsemide  (DEMADEX ) 20 MG tablet Take 3 tablets (60 mg total) by mouth daily. 90 tablet 5   traMADol  (ULTRAM ) 50 MG tablet Take 50 mg by mouth every 4 (four) hours as needed.     No current facility-administered medications for this visit.   BP 119/64   Pulse 69   Wt 217 lb 6.4 oz (98.6 kg)   SpO2 97%   BMI 28.68 kg/m  PHYSICAL EXAM: General: NAD Neck: No JVD, no thyromegaly or thyroid nodule.  Lungs: Clear to auscultation bilaterally with normal respiratory effort. CV: Nondisplaced PMI.  Heart regular S1/S2, no S3/S4, no murmur.  No peripheral edema.  No carotid bruit.  Normal pedal pulses.  Abdomen: Soft, nontender, no hepatosplenomegaly, no distention.  Skin: Intact without lesions or rashes.  Neurologic: Alert and oriented x 3.  Psych: Normal affect. Extremities: No clubbing or cyanosis.  HEENT: Normal.   Assessment/Plan: 1. CAD: S/p anterior MI in 2011. Denies chest pain  - Continue ASA 81 daily.  - Continue  Atorvastatin  80 mg daily.    2. Chronic systolic CHF: Ischemic cardiomyopathy with [employer] ICD.  EF has been low since 2011.  Most recent echo in 2/24 with EF 35%, normal RV. CPX 5/23 was a submaximal, mild HF limitation but additional functional limitation due to deconditioning and asthma.  NYHA class II, symptoms confounded by significant asthma (I think this is the source of his current congestion and coughing).  He is not volume overloaded by exam or REDS clip (30%).  - Continue torsemide  60 mg daily. BMET/BNP today. - Continue bisoprolol  10 mg daily.  - Continue spironolactone  25 mg daily.  - Continue digoxin , check level today.  - Continue Entresto  97/103 bid.   - No SGLT2 inhibitor due to type 1 diabetes.   - He was unable to tolerate low dose Bidil (0.5 tab tid) due to orthostatic symptoms. - Insurance denied barostimulator placement.  He also would be a good candidate for a cardiac contractility modulator.  I will get him an appt with EP (Dr. [name]) to see if he can get coverage for this (he missed his prior appt).    - QRS not wide enough to benefit from CRT.  - I will arrange for repeat echo.  3. Type 1 diabetes: Following with Endocrine - Has not been well-controlled historically.  4. Asthma:  Severe per prior notes.   5. OSA: Using CPAP.  6. Hyperlipidemia: Goal LDL < 55 LDL was 89 in 12/24.   - Continue atorvastatin  80 mg daily. - He needs to start Zetia  10 mg daily, check lipids in 2 months.         F/u 2 months   I spent 32 minutes reviewing records, interviewing/examining patient, and managing orders.    Peder Bourdon,  02/01/2024

## 2024-02-01 NOTE — Patient Instructions (Addendum)
 Medication Changes:  START Zetia  10mg  (1 tab) daily  Lab Work:  Go DOWN to LOWER LEVEL (LL) to have your blood work completed inside of [employer] office.  We will only call you if the results are abnormal or if the provider would like to make medication changes.    Special Instructions // Education:  Please follow up with [facility] with Dr. Candace Cerise office in order to consult about a Cardiac Contractility Modulator.  Follow-Up in: Please follow up with the [facility] in 3 months with Dr. Mitzie Anda.  At the [facility], you and your health needs are our priority. We have a designated team specialized in the treatment of Heart Failure. This Care Team includes your primary Heart Failure Specialized Cardiologist (physician), Advanced Practice Providers (APPs- Physician Assistants and Nurse Practitioners), and Pharmacist who all work together to provide you with the care you need, when you need it.   You may see any of the following providers on your designated Care Team at your next follow up:  Dr. Jules Oar Dr. Peder Bourdon Dr. Alwin Baars Dr. Judyth Nunnery Shawnee Dellen, FNP Bevely Brush, RPH-CPP  Please be sure to bring in all your medications bottles to every appointment.   Need to Contact Us :  If you have any questions or concerns before your next appointment please send us  a message through [place] or call our office at [phone].    TO LEAVE A MESSAGE FOR THE NURSE SELECT OPTION 2, PLEASE LEAVE A MESSAGE INCLUDING: YOUR NAME DATE OF BIRTH CALL BACK NUMBER REASON FOR CALL**this is important as we prioritize the call backs  YOU WILL RECEIVE A CALL BACK THE SAME DAY AS LONG AS YOU CALL BEFORE 4:00 PM

## 2024-02-01 NOTE — Progress Notes (Signed)
 ReDS Vest / Clip - 02/01/24 1500       ReDS Vest / Clip   Station Marker D    Ruler Value 38    ReDS Value Range Low volume    ReDS Actual Value 30

## 2024-02-02 LAB — BASIC METABOLIC PANEL WITH GFR
BUN/Creatinine Ratio: 11 (ref 9–20)
BUN: 12 mg/dL (ref 6–24)
CO2: 25 mmol/L (ref 20–29)
Calcium: 8.9 mg/dL (ref 8.7–10.2)
Chloride: 103 mmol/L (ref 96–106)
Creatinine, Ser: 1.12 mg/dL (ref 0.76–1.27)
Glucose: 135 mg/dL — ABNORMAL HIGH (ref 70–99)
Potassium: 4.4 mmol/L (ref 3.5–5.2)
Sodium: 143 mmol/L (ref 134–144)
eGFR: 79 mL/min/{1.73_m2} (ref >59)

## 2024-02-02 LAB — DIGOXIN LEVEL: Digoxin, Serum: 0.8 ng/mL (ref 0.5–0.9)

## 2024-02-02 LAB — BRAIN NATRIURETIC PEPTIDE: BNP: 39.8 pg/mL (ref 0.0–100.0)

## 2024-02-09 ENCOUNTER — Telehealth: Payer: Self-pay

## 2024-02-09 ENCOUNTER — Other Ambulatory Visit
Admission: RE | Admit: 2024-02-09 | Discharge: 2024-02-09 | Disposition: A | Discharge status: Home / Self Care | Source: Ambulatory Visit | Attending: Internal Medicine | Admitting: Internal Medicine

## 2024-02-09 ENCOUNTER — Ambulatory Visit: Payer: Self-pay | Admitting: Internal Medicine

## 2024-02-09 ENCOUNTER — Encounter: Payer: Self-pay | Admitting: Internal Medicine

## 2024-02-09 VITALS — BP 116/78 | HR 73 | Temp 98.5°F | Ht 73.0 in | Wt 219.6 lb

## 2024-02-09 DIAGNOSIS — J4540 Moderate persistent asthma, uncomplicated: Secondary | ICD-10-CM | POA: Insufficient documentation

## 2024-02-09 DIAGNOSIS — K2100 Gastro-esophageal reflux disease with esophagitis, without bleeding: Secondary | ICD-10-CM

## 2024-02-09 DIAGNOSIS — G4733 Obstructive sleep apnea (adult) (pediatric): Secondary | ICD-10-CM | POA: Diagnosis not present

## 2024-02-09 LAB — CBC WITH DIFFERENTIAL/PLATELET
Abs Immature Granulocytes: 0.02 10*3/uL (ref 0.00–0.07)
Basophils Absolute: 0 10*3/uL (ref 0.0–0.1)
Basophils Relative: 0 %
Eosinophils Absolute: 0 10*3/uL (ref 0.0–0.5)
Eosinophils Relative: 0 %
HCT: 36.1 % — ABNORMAL LOW (ref 39.0–52.0)
Hemoglobin: 12.5 g/dL — ABNORMAL LOW (ref 13.0–17.0)
Immature Granulocytes: 0 %
Lymphocytes Relative: 23 %
Lymphs Abs: 1.9 10*3/uL (ref 0.7–4.0)
MCH: 31 pg (ref 26.0–34.0)
MCHC: 34.6 g/dL (ref 30.0–36.0)
MCV: 89.6 fL (ref 80.0–100.0)
Monocytes Absolute: 0.8 10*3/uL (ref 0.1–1.0)
Monocytes Relative: 10 %
Neutro Abs: 5.4 10*3/uL (ref 1.7–7.7)
Neutrophils Relative %: 67 %
Platelets: 209 10*3/uL (ref 150–400)
RBC: 4.03 MIL/uL — ABNORMAL LOW (ref 4.22–5.81)
RDW: 12.8 % (ref 11.5–15.5)
WBC: 8.1 10*3/uL (ref 4.0–10.5)
nRBC: 0 % (ref 0.0–0.2)

## 2024-02-09 MED ORDER — PANTOPRAZOLE SODIUM 40 MG PO TBEC: 40.0000 mg | DELAYED_RELEASE_TABLET | Freq: Every day | ORAL | 1 refills | Status: DC

## 2024-02-09 MED ORDER — TRELEGY ELLIPTA 200-62.5-25 MCG/ACT IN AEPB: 1.0000 | INHALATION_SPRAY | Freq: Every day | RESPIRATORY_TRACT | 5 refills | Status: DC

## 2024-02-09 MED ORDER — SACUBITRIL-VALSARTAN 49-51 MG PO TABS: 2.0000 | ORAL_TABLET | Freq: Two times a day (BID) | ORAL | Status: DC

## 2024-02-09 NOTE — Progress Notes (Signed)
 [facility] [place] [facility] Consultation      CC Follow-up assessment for shortness of breath Follow-up assessment for asthma Follow-up assessment for OSA  Past Surgical History:  He  has a past surgical history that includes Cardiac defibrillator placement (08/30/2010); LEFT HEART CATH AND CORONARY ANGIOGRAPHY (N/A, 11/23/2016); Esophagogastroduodenoscopy (egd) with propofol  (N/A, 03/20/2018); Coronary angioplasty (2011); Cholecystectomy (N/A, 11/07/2017); Prostate biopsy (2019); Prostate biopsy (N/A, 10/23/2019); Transrectal ultrasound (N/A, 10/23/2019); Wrist surgery (Right, 2008); Robot assisted laparoscopic radical prostatectomy (N/A, 01/03/2020); Cystoscopy (N/A, 01/03/2020); and Colonoscopy with propofol  (N/A, 03/04/2023).  Past Medical History:  Prostate cancer, CHF, CAD, DM type 2, ED, GERD, HLD, HTN, Seizures, Neuropathy  Constitutional:  There were no vitals taken for this visit.  Brief Summary:  Joseph [name] is a 53 y.o. male with asthma and obstructive sleep apnea.      Subjective:    Assessment of asthma Previous diagnosis of COVID and influenza pneumonia Non-smoker Evidence of allergic rhinitis with sinus congestion and postnasal drip No exacerbation at this time No evidence of heart failure at this time No evidence or signs of infection at this time No respiratory distress No fevers, chills, nausea, vomiting, diarrhea No evidence of lower extremity edema No evidence hemoptysis Patient is still having persistent symptoms despite using Symbicort  inhaler Patient states that Trelegy inhaler has worked the best Patient has been using his albuterol  inhaler every day At this time he has moderate persistent disease   Assessment of OSA His sleep study from 2018 showed moderate sleep apnea.  He had CPAP, but mask broke and he wasn't able to get replacement supplies.  It has been about 3 years since he last used CPAP.  He felt that CPAP helped when he was able to  use this.  He has snoring and stops breathing at night.  He gets sleepy during the day and falls asleep sometimes when watching TV. Patient has AHI of 19 however when he uses his AHI is down to 2 Recommend mask fitting assessment desensitization referral  Physical Exam:   BP 116/78 (BP Location: Right Arm, Patient Position: Sitting, Cuff Size: Normal)   Pulse 73   Temp 98.5 F (36.9 C) (Oral)   Ht 6\' 1"  (1.854 m)   Wt 219 lb 9.6 oz (99.6 kg)   SpO2 95%   BMI 28.97 kg/m    Review of Systems: Gen:  Denies  fever, sweats, chills weight loss  HEENT: Denies blurred vision, double vision, ear pain, eye pain, hearing loss, nose bleeds, sore throat Cardiac:  No dizziness, chest pain or heaviness, chest tightness,edema, No JVD Resp:   No cough, -sputum production, +shortness of breath,+wheezing, -hemoptysis,  Other:  All other systems negative   Physical Examination:   General Appearance: No distress  EYES PERRLA, EOM intact.   NECK Supple, No JVD Pulmonary: normal breath sounds, No wheezing.  CardiovascularNormal S1,S2.  No m/r/g.   Abdomen: Benign, Soft, non-tender. Neurology UE/LE 5/5 strength, no focal deficits Ext pulses intact, cap refill intact ALL OTHER ROS ARE NEGATIVE   Pulmonary testing:  RAST 11/27/19>> dust mite, grasses, mold, tree bark, ragweed; IgE 401 HP panel 11/27/19 >> negative PFT 02/06/20 >> FEV1 1.92 (55%), FEV1% 65, TLC 57%, DLCO 61% FeNO 02/10/23 >> 76  Chest Imaging:  CT chest 05/08/21 >> lungs clear  Sleep Tests:  HST 11/30/16 >> AHI 19, SpO2 low 88%  Cardiac Tests:  CPET 02/17/22 >> limitation due to CHF and obesity Echo 12/15/22 >> EF 30 to  35%, grade 2 DD, mild MR  Social History:  He  reports that he has never smoked. He has never used smokeless tobacco. He reports that he does not drink alcohol and does not use drugs.  Family History:  His family history includes Heart attack in his father and paternal grandmother; Hypertension in his mother.      Assessment/Plan:  53 year old male seen today for follow-up assessment for moderate severe persistent asthma with allergic rhinitis in the setting of moderate sleep apnea with a history of coronary artery disease with a reduced EF chronic systolic heart failure, patient with uncontrolled reflux   Moderate severe, persistent asthma with allergic phenotype. Start Trelegy 200 one  puff once a day This has helped him in the past significantly Recommend rinsing mouth after use Continue to use albuterol  as needed Will check CBC with differential Check IgE levels Avoid Allergens and Irritants Avoid secondhand smoke Avoid SICK contacts Recommend  Masking  when appropriate Recommend Keep up-to-date with vaccinations  Moderate OSA AHI of 19 Patient is noncompliant due to issues with mask Recommend mask desensitization referral and grains  CAD with reduced EF chronic systolic heart failure Follow-up with cardiology as scheduled Patient with defibrillator in place  GERD uncontrolled Start Protonix  40 mg daily Follow-up GI consultation pending   Allergies as of 02/09/2024       Reactions   Vancomycin Shortness Of Breath   Lisinopril Rash        Medication List        Accurate as of Feb 09, 2024  8:17 AM. If you have any questions, ask your nurse or doctor.          acetaminophen  500 MG tablet Commonly known as: TYLENOL  Take 500 mg by mouth every 6 (six) hours as needed for fever.   albuterol  108 (90 Base) MCG/ACT inhaler Commonly known as: Proventil  HFA Inhale 2 puffs into the lungs every 4 (four) hours as needed for wheezing or shortness of breath.   aspirin  EC 81 MG tablet Take 81 mg by mouth every morning.   atorvastatin  80 MG tablet Commonly known as: LIPITOR TAKE 1 TABLET BY MOUTH EVERY DAY   B-D ULTRAFINE III SHORT PEN 31G X 8 MM Misc Generic drug: Insulin  Pen Needle USE 5 TIMES DAILY AS  DIRECTED   bisoprolol  10 MG tablet Commonly known as:  ZEBETA  TAKE 1 TABLET BY MOUTH EVERY DAY   Cholecalciferol  25 MCG (1000 UT) capsule Take 1,000 Units by mouth daily.   digoxin  0.125 MG tablet Commonly known as: LANOXIN  TAKE 1 TABLET BY MOUTH EVERY DAY   Entresto  97-103 MG Generic drug: sacubitril -valsartan  TAKE 1 TABLET BY MOUTH TWICE A DAY. STOP LOSARTAN    ezetimibe  10 MG tablet Commonly known as: ZETIA  Take 1 tablet (10 mg total) by mouth daily.   fluticasone  50 MCG/ACT nasal spray Commonly known as: FLONASE  Place 2 sprays into both nostrils daily as needed for allergies.   glucagon (human recombinant) 1 MG injection Commonly known as: GLUCAGEN Inject into the muscle as needed.   insulin  aspart 100 UNIT/ML FlexPen Commonly known as: NOVOLOG  Inject 18-20-22 u TID AC plus sliding scale for up to 75 units TDD   loratadine  10 MG tablet Commonly known as: CLARITIN  Take 10 mg by mouth daily as needed.   naproxen sodium 220 MG tablet Commonly known as: ALEVE Take 220 mg by mouth daily as needed.   pantoprazole  20 MG tablet Commonly known as: PROTONIX  Take 20 mg by mouth as  needed.   spironolactone  25 MG tablet Commonly known as: ALDACTONE  TAKE 1 TABLET (25 MG TOTAL) BY MOUTH DAILY.   torsemide  20 MG tablet Commonly known as: DEMADEX  Take 3 tablets (60 mg total) by mouth daily.   traMADol  50 MG tablet Commonly known as: ULTRAM  Take 50 mg by mouth every 4 (four) hours as needed.   Trelegy Ellipta  200-62.5-25 MCG/ACT Aepb Generic drug: Fluticasone -Umeclidin-Vilant Inhale 1 puff into the lungs daily.   Trelegy Ellipta  200-62.5-25 MCG/ACT Aepb Generic drug: Fluticasone -Umeclidin-Vilant Inhale 1 puff into the lungs daily.   Tresiba  FlexTouch 200 UNIT/ML FlexTouch Pen Generic drug: insulin  degludec Inject 60 Units into the skin daily.        MEDICATION ADJUSTMENTS/LABS AND TESTS ORDERED: Start Trelegy inhaler 200, 1 puff once a day Please rinse mouth after use Continue to use albuterol  as  needed Recommend obtaining CBC with differential Recommend obtaining IgE levels Referral to GI for reflux Start Protonix  40 mg daily Referral to mask fitting desensitization protocol Avoid Allergens and Irritants Avoid secondhand smoke Avoid SICK contacts Recommend  Masking  when appropriate Recommend Keep up-to-date with vaccinations   CURRENT MEDICATIONS REVIEWED AT LENGTH WITH PATIENT TODAY   Patient  satisfied with Plan of action and management. All questions answered   Follow up 6 weeks   I spent a total of 48 minutes reviewing chart data, face-to-face evaluation with the patient, counseling and coordination of care as detailed above.      Lady Pier, M.D.  [facility]  Medical Director [facility] Medical Director [facility] Cardio-Pulmonary Department

## 2024-02-09 NOTE — Patient Instructions (Signed)
 Start Trelegy inhaler 200, 1 puff once a day Please rinse mouth after use Continue to use albuterol  as needed  Recommend obtaining CBC with differential Recommend obtaining IgE levels  Referral to GI for reflux Start Protonix  40 mg daily  Referral to mask fitting desensitization protocol  Avoid Allergens and Irritants Avoid secondhand smoke Avoid SICK contacts Recommend  Masking  when appropriate Recommend Keep up-to-date with vaccinations

## 2024-02-09 NOTE — Telephone Encounter (Signed)
 Pt arrived to clinic to receive samples of entresto . Pt having issues with insurance covering entresto . Pt already receiving assistance with covering entresto  and just needed samples to cover til their new insurance kicks in. Gave 2 bottles of 49-51mg  with the instruction of 2 tab two times daily until he can pick up his new prescription. Pt verbalized understanding and is agreeable.

## 2024-02-12 LAB — IGE: IgE (Immunoglobulin E), Serum: 306 [IU]/mL (ref 6–495)

## 2024-02-18 ENCOUNTER — Other Ambulatory Visit (HOSPITAL_COMMUNITY): Payer: Self-pay | Admitting: Cardiology

## 2024-02-23 ENCOUNTER — Telehealth (HOSPITAL_COMMUNITY): Payer: Self-pay

## 2024-02-23 NOTE — Telephone Encounter (Signed)
 Documents faxed to [employer] at [phone] on 02/23/24. My chart message sent to patient to inform him forms have been faxed

## 2024-02-29 ENCOUNTER — Encounter: Payer: Self-pay | Admitting: Cardiology

## 2024-02-29 ENCOUNTER — Ambulatory Visit: Payer: Self-pay | Attending: Cardiology | Admitting: Cardiology

## 2024-02-29 VITALS — BP 116/62 | HR 73 | Ht 73.0 in | Wt 220.6 lb

## 2024-02-29 DIAGNOSIS — I5022 Chronic systolic (congestive) heart failure: Secondary | ICD-10-CM

## 2024-02-29 DIAGNOSIS — Z95810 Presence of automatic (implantable) cardiac defibrillator: Secondary | ICD-10-CM | POA: Diagnosis not present

## 2024-02-29 NOTE — Progress Notes (Signed)
 The Electrophysiology Office Note:    Date:  02/29/2024   ID:  Joseph [name], DOB January 02, 1971, MRN [id number]  [facility] Cardiologist:  None  [facility] Electrophysiologist:  Boyce Byes, MD   Referring MD: [facility]   Chief Complaint: chronic systolic heart failure  History of Present Illness:    Joseph [name] is a 53 year old male seen today for an evaluation of chronic systolic heart failure at the request of Dr. Mitzie Anda.  The patient was last seen in his clinic February 01, 2024.  He has a history of diabetes, hypertension, coronary artery disease, chronic systolic heart failure, sleep apnea.  In 2011 he had an anterior STEMI.  He has a Environmental manager ICD in place.  He presents to discuss CCM therapy.  Today he tells me he feels short of breath with exertion.  He has to take multiple breaks when mowing his grass.  He has multiple episodes of shortness of breath throughout the day where he spits up phlegm.    Their past medical, social and family history was reviewed.   ROS:   Please see the history of present illness.    All other systems reviewed and are negative.  EKGs/Labs/Other Studies Reviewed:    The following studies were reviewed today:  December 16, 2022 echo EF 30-35 RV normal Mild MR  February 01, 2024 EKG shows sinus rhythm.  Incomplete right bundle branch block.  Left axis.  Anterior infarct.  November 08, 2022 chest x-ray reviewed.  Single-chamber, single coil ICD in situ in the left prepectoral position  Feb 29, 2024 in-clinic device interrogation personally reviewed [employer] scientific device 7 mo longevity remaining Leads stable No programming changes made today         Physical Exam:    VS:  BP 116/62   Pulse 73   Ht 6\' 1"  (1.854 m)   Wt 220 lb 9.6 oz (100.1 kg)   SpO2 96%   BMI 29.10 kg/m     Wt Readings from Last 3 Encounters:  02/29/24 220 lb 9.6 oz (100.1 kg)  02/09/24 219 lb 9.6 oz (99.6 kg)  02/01/24 217 lb 6.4 oz  (98.6 kg)     GEN: no distress CARD: RRR, No MRG.  CIED pocket well healed. RESP: No IWOB. CTAB.        ASSESSMENT AND PLAN:    1. Chronic systolic CHF (congestive heart failure) (HCC)   2. Implantable cardioverter-defibrillator (ICD) in situ     #Chronic systolic heart failure NYHA class II-III.  On GDMT.  Follows with Dr. Mitzie Anda in the heart failure clinic.  Last EF 30%.  I discussed the role for CCM in patients with chronic systolic heart failure and persistent symptoms despite GDMT.  I discussed the procedure details including the risks and recovery.  He would like to be considered for implant.  Risks, benefits, alternatives to CCM implantation were discussed in detail with the patient today. The patient understands that the risks include but are not limited to bleeding, infection, pneumothorax, perforation, tamponade, vascular damage, renal failure, MI, stroke, death, and lead dislodgement and wishes to proceed.  We will therefore schedule device implantation at the next available time.  #ICD in situ Device functioning appropriately. Continue ICD follow up with [facility].       Signed, Leanora Prophet. [name], MD, [facility] 02/29/2024 11:21 AM    Electrophysiology [facility]

## 2024-02-29 NOTE — Patient Instructions (Signed)
 Medication Instructions:  Your physician recommends that you continue on your current medications as directed. Please refer to the Current Medication list given to you today.  *If you need a refill on your cardiac medications before your next appointment, please call your pharmacy*  Testing/Procedures: CCM (cardiac contractility modulation) - we will be in contact with you for scheduling  Follow-Up: At [facility], you and your health needs are our priority.  As part of our continuing mission to provide you with exceptional heart care, our providers are all part of one team.  This team includes your primary Cardiologist (physician) and Advanced Practice Providers or APPs (Physician Assistants and Nurse Practitioners) who all work together to provide you with the care you need, when you need it.

## 2024-03-01 ENCOUNTER — Institutional Professional Consult (permissible substitution): Admitting: Cardiology

## 2024-03-02 ENCOUNTER — Telehealth: Payer: Self-pay

## 2024-03-02 ENCOUNTER — Other Ambulatory Visit (HOSPITAL_COMMUNITY): Payer: Self-pay

## 2024-03-02 ENCOUNTER — Ambulatory Visit (HOSPITAL_COMMUNITY): Payer: Self-pay | Admitting: Pharmacist

## 2024-03-02 ENCOUNTER — Telehealth (HOSPITAL_COMMUNITY): Payer: Self-pay | Admitting: Pharmacist

## 2024-03-02 MED ORDER — ENTRESTO 97-103 MG PO TABS: ORAL_TABLET | ORAL | 3 refills | Status: AC

## 2024-03-02 NOTE — Telephone Encounter (Signed)
 Copied from CRM [id number]. Topic: Clinical - Prescription Issue >> Mar 02, 2024  2:31 PM Tyronne Galloway wrote: Reason for CRM: Pt stated he attempted to get his Fluticasone -Umeclidin-Vilant (TRELEGY ELLIPTA ) 200-62.5-25 MCG/ACT AEPB Medication filled at [facility], however, he stated even with insurance he cannot afford the medication. Pt was told there is an alternative called the xolair inhaler, and he was wanting to know if Dr. Auston Left would be able to write him a prescription for this inhaler to his preferred pharmacy. Pt's phone number is [phone] ok to leave a vm.

## 2024-03-02 NOTE — Progress Notes (Signed)
 Error

## 2024-03-02 NOTE — Telephone Encounter (Addendum)
 Patient called stating that his Entresto  is too expensive. Copay check revealed $325 remaining on deductible for 2025, will send 90 days of Entresto  with copay card to meet deductible. [facility]irmed with [facility] that the copay will be $10.Entresto  Copay card: BIN: [id number]  PCN: [id number]  GRP: [id number]  ID: [id number]   Please do not hesitate to reach out with questions or concerns,  Bevely Brush, PharmD, CPP, BCPS Heart Failure Pharmacist  Phone - [phone] 03/02/2024 2:26 PM

## 2024-03-06 ENCOUNTER — Other Ambulatory Visit: Payer: Self-pay | Admitting: Internal Medicine

## 2024-03-06 LAB — CUP PACEART INCLINIC DEVICE CHECK
Date Time Interrogation Session: 20250521000000
HighPow Impedance: 71 Ohm
Implantable Lead Connection Status: 753985
Implantable Lead Implant Date: 20111105
Implantable Lead Location: 753860
Implantable Lead Model: 181
Implantable Lead Serial Number: 314746
Implantable Pulse Generator Implant Date: 20111105
Lead Channel Impedance Value: 502 Ohm
Lead Channel Pacing Threshold Amplitude: 1 V
Lead Channel Pacing Threshold Pulse Width: 0.5 ms
Lead Channel Sensing Intrinsic Amplitude: 13.2 mV
Lead Channel Setting Pacing Amplitude: 2.4 V
Lead Channel Setting Pacing Pulse Width: 0.5 ms
Lead Channel Setting Sensing Sensitivity: 0.6 mV
Pulse Gen Serial Number: 272539
Zone Setting Status: 755011

## 2024-03-06 NOTE — Telephone Encounter (Signed)
 Pharmacy can you see what alternatives the patient's insurance will cover. Thank you!

## 2024-03-07 ENCOUNTER — Other Ambulatory Visit (HOSPITAL_COMMUNITY): Payer: Self-pay

## 2024-03-07 ENCOUNTER — Telehealth: Payer: Self-pay

## 2024-03-07 ENCOUNTER — Ambulatory Visit: Payer: Self-pay | Admitting: Cardiology

## 2024-03-07 NOTE — Telephone Encounter (Signed)
 Per test claims patient has a deductible to meet before prices go down. Alternatives/ combinations at this time are as follows.   Trelegy- $ 332.16 Breztri- $35.00 [employer], the mfr of BREZTRI AEROSPHERE INHALER paid 290.84 toward your plan copay  Sent message in original request to office stating that the coupon may not be picked up at other pharmacies and it IS being picked up by [facility] systems.

## 2024-03-07 NOTE — Telephone Encounter (Signed)
 At this time it looks like there is a Musician coupon attached to the patients insurance for [place] making the cost $35.00. This is through [employer] however- other pharmacies have different systems which may not pick up this coupon.

## 2024-03-08 NOTE — Telephone Encounter (Signed)
 Per Dr. Auston Left, ok to send in Winter to the patient's pharmacy.  Lm x1 for the patient to confirm he is ok getting the [facility] from a [employer].

## 2024-03-09 NOTE — Telephone Encounter (Signed)
 Lm x2 for the patient.

## 2024-03-14 NOTE — Telephone Encounter (Signed)
 Lm x3 for the patient. I have mailed him a letter asking him to give our office a call.  Closing this encounter per office protocol.

## 2024-03-16 ENCOUNTER — Other Ambulatory Visit: Payer: Self-pay

## 2024-03-16 MED ORDER — BREZTRI AEROSPHERE 160-9-4.8 MCG/ACT IN AERO
2.0000 | INHALATION_SPRAY | Freq: Two times a day (BID) | RESPIRATORY_TRACT | 11 refills | Status: DC
  Filled 2024-03-16: qty 10.7, 30d supply, fill #0
  Filled 2024-05-22: qty 10.7, 30d supply, fill #1
  Filled 2024-06-27 – 2024-08-22 (×3): qty 10.7, 30d supply, fill #2

## 2024-03-16 NOTE — Telephone Encounter (Addendum)
 I have notified the patient. He is okay with getting the [facility] from [facility] pharmacy.  I have sent in the prescription.  I spoke with the pharmacy and they did not know if they had coupon cards. I did leave one at the front desk for him.   I tried to call him back but I did not get an answer and no voicemail. I will try again later.

## 2024-03-16 NOTE — Addendum Note (Signed)
 Addended by: Pepper Boyer on: 03/16/2024 11:42 AM   Modules accepted: Orders

## 2024-03-19 NOTE — Telephone Encounter (Signed)
 I did notify the patient of the coupon card.  Nothing further needed.

## 2024-03-20 ENCOUNTER — Other Ambulatory Visit: Payer: Self-pay | Admitting: Internal Medicine

## 2024-03-20 DIAGNOSIS — K219 Gastro-esophageal reflux disease without esophagitis: Secondary | ICD-10-CM

## 2024-03-20 DIAGNOSIS — K2100 Gastro-esophageal reflux disease with esophagitis, without bleeding: Secondary | ICD-10-CM

## 2024-03-20 DIAGNOSIS — K2990 Gastroduodenitis, unspecified, without bleeding: Secondary | ICD-10-CM

## 2024-03-27 ENCOUNTER — Telehealth: Payer: Self-pay

## 2024-03-27 NOTE — Telephone Encounter (Signed)
 Lvm to have pt call back to clarify disability paper work needs.

## 2024-03-29 ENCOUNTER — Ambulatory Visit: Admitting: Internal Medicine

## 2024-03-29 ENCOUNTER — Encounter: Payer: Self-pay | Admitting: Internal Medicine

## 2024-03-29 VITALS — BP 100/62 | HR 70 | Temp 98.3°F | Ht 73.0 in | Wt 219.6 lb

## 2024-03-29 DIAGNOSIS — J4540 Moderate persistent asthma, uncomplicated: Secondary | ICD-10-CM | POA: Diagnosis not present

## 2024-03-29 DIAGNOSIS — I255 Ischemic cardiomyopathy: Secondary | ICD-10-CM

## 2024-03-29 DIAGNOSIS — G4733 Obstructive sleep apnea (adult) (pediatric): Secondary | ICD-10-CM | POA: Diagnosis not present

## 2024-03-29 MED ORDER — BREZTRI AEROSPHERE 160-9-4.8 MCG/ACT IN AERO: 2.0000 | INHALATION_SPRAY | Freq: Two times a day (BID) | RESPIRATORY_TRACT | Status: AC

## 2024-03-29 NOTE — Progress Notes (Unsigned)
 [facility] [place] Pulmonary Medicine Consultation      CC Follow-up assessment for shortness of breath follow-up assessment for asthma follow-up assessment for OSA  Past Surgical History:  He  has a past surgical history that includes Cardiac defibrillator placement (08/30/2010); LEFT HEART CATH AND CORONARY ANGIOGRAPHY (N/A, 11/23/2016); Esophagogastroduodenoscopy (egd) with propofol  (N/A, 03/20/2018); Coronary angioplasty (2011); Cholecystectomy (N/A, 11/07/2017); Prostate biopsy (2019); Prostate biopsy (N/A, 10/23/2019); Transrectal ultrasound (N/A, 10/23/2019); Wrist surgery (Right, 2008); Robot assisted laparoscopic radical prostatectomy (N/A, 01/03/2020); Cystoscopy (N/A, 01/03/2020); and Colonoscopy with propofol  (N/A, 03/04/2023).   Eosinophil level 0 May 2025 IgE level 306 May 2025 Past Medical History:  Prostate cancer, CHF, CAD, DM type 2, ED, GERD, HLD, HTN, Seizures, Neuropathy  Constitutional:  There were no vitals taken for this visit.  Brief Summary:  Joseph [name] is a 53 y.o. male with asthma and obstructive sleep apnea.      Subjective:   Assessment of asthma Previous diagnosis of COVID and influenza Non-smoker Allergic rhinitis with sinus congestion postnasal drip No exacerbation at this time No evidence of heart failure at this time No evidence or signs of infection at this time No respiratory distress No fevers, chills, nausea, vomiting, diarrhea No evidence of lower extremity edema No evidence hemoptysis Patient states that Trelegy inhaler has worked the best Patient has been using his albuterol  inhaler every day At this time he has moderate persistent disease   Assessment of ASTHMA FeNO 13   ppb-Elevated exhaled Nitric oxide  testing is highly consistent with type II inflammation  -continue inhalers as prescribed -consider systemic steroids -consider CBC  -assess for Biological agents   Assessment of OSA His sleep study from 2018 showed moderate  sleep apnea.  He had CPAP, but mask broke and he wasn't able to get replacement supplies.  Will place referral for [employer]   Discussed sleep data and reviewed with patient.  Encouraged proper weight management.  Discussed driving precautions and its relationship with hypersomnolence.  Discussed sleep hygiene, and benefits of a fixed sleep waked time.  The importance of getting eight or more hours of sleep discussed with patient.  Discussed limiting the use of the computer and television before bedtime.  Decrease naps during the day, so night time sleep will become enhanced.  Limit caffeine, and sleep deprivation.   Patient uses and benefits from therapy Using CPAP nightly and with naps Pressure setting is comfortable and is sleeping well.   Physical Exam:    BP 100/62 (BP Location: Right Arm, Patient Position: Sitting, Cuff Size: Large)   Pulse 70   Temp 98.3 F (36.8 C) (Oral)   Ht 6' 1 (1.854 m)   Wt 219 lb 9.6 oz (99.6 kg)   SpO2 98%   BMI 28.97 kg/m     Review of Systems: Gen:  Denies  fever, sweats, chills weight loss  HEENT: Denies blurred vision, double vision, ear pain, eye pain, hearing loss, nose bleeds, sore throat Cardiac:  No dizziness, chest pain or heaviness, chest tightness,edema, No JVD Resp:   No cough, -sputum production, -shortness of breath,-wheezing, -hemoptysis,  Other:  All other systems negative   Physical Examination:   General Appearance: No distress  EYES PERRLA, EOM intact.   NECK Supple, No JVD Pulmonary: normal breath sounds, No wheezing.  CardiovascularNormal S1,S2.  No m/r/g.   Abdomen: Benign, Soft, non-tender. Neurology UE/LE 5/5 strength, no focal deficits Ext pulses intact, cap refill intact ALL OTHER ROS ARE NEGATIVE  Pulmonary testing:  RAST 11/27/19>> dust mite, grasses, mold, tree bark, ragweed; IgE 401 HP panel 11/27/19 >> negative PFT 02/06/20 >> FEV1 1.92 (55%), FEV1% 65, TLC 57%, DLCO 61% FeNO 02/10/23 >>  76  Chest Imaging:  CT chest 05/08/21 >> lungs clear  Sleep Tests:  HST 11/30/16 >> AHI 19, SpO2 low 88%  Cardiac Tests:  CPET 02/17/22 >> limitation due to CHF and obesity Echo 12/15/22 >> EF 30 to 35%, grade 2 DD, mild MR  Social History:  He  reports that he has never smoked. He has never used smokeless tobacco. He reports that he does not drink alcohol and does not use drugs.  Family History:  His family history includes Heart attack in his father and paternal grandmother; Hypertension in his mother.     Assessment/Plan:  53 year old male seen today for follow-up assessment for moderate severe persistent asthma with allergic rhinitis in the setting of moderate sleep apnea with a history of coronary artery disease with a reduced EF chronic systolic heart failure, patient with uncontrolled reflux  Moderate to severe persistent asthma with allergic phenotype Normal eosinophil count and IgE levels Continue Breztri  as prescribed Rinse mouth after use No exacerbation at this time No evidence of heart failure at this time No evidence or signs of infection at this time No respiratory distress No fevers, chills, nausea, vomiting, diarrhea No evidence of lower extremity edema No evidence hemoptysis Avoid Allergens and Irritants Avoid secondhand smoke Avoid SICK contacts Recommend  Masking  when appropriate Recommend Keep up-to-date with vaccinations   Moderate OSA AHI of 19 Patient is noncompliant due to issues with mask [employer] referral   CAD with reduced EF chronic systolic heart failure Follow-up with cardiology as scheduled Patient with defibrillator in place Follow-up cardiology for clearance  GERD uncontrolled Continue Protonix  40 mg daily Follow-up GI consultation pending  Preop assessment for cyst surgery in the back  Surgical preop assessment Patient is a moderate to high risk for postop and Intra-Op complications due to her Lung disease  I have  discussed that there is always a increased risk Pulmonary Infection, increased chance of Respiratory Failure and Cardiac Arrest, increased chance of pneumothorax and collapsed lung, as well as increased Stroke and Death. I have explained Risks to patient   At this time, Patient is at optimal medical management.   Patient has low to moderate risk for postop complications Patient is optimized with her medical therapy  Preoperative Pulmonary Risk Assessment Patient is proposed for low /moderate risk surgery.   General Risk Reduction Strategies: - All patients warrant post-operative incentive spirometry. For those with obstruction, also consider flutter valve. - Early ambulation, PT/OT - DVT prophylaxis where appropriate - Adequate pain control without oversedation    Preoperative respiratory examination Advised her that we do not provide clearance, simply for risk stratification for postop pulmonary complications.  She will need to discuss further with her surgeon to decide if this is an appropriate option for her moving forward given her comorbidities.     RISK FOR PROLONGED MECHANICAL VENTILAION - > 48h  Arozullah - Prolonged mech ventilation risk Arozullah Postperative Pulmonary Risk Score - for mech ventilation dependence >48h USAA, Ann Surg 2000, major non-cardiac surgery) Comment Score  Type of surgery - abd ao aneurysm (27), thoracic (21), neurosurgery / upper abdominal / vascular (21), neck (11)    Emergency Surgery - (11)    ALbumin < 3 or poor nutritional state - (9)    BUN >  30 -  (8)    Partial or completely dependent functional status - (7)    COPD -  (6) Mild intermittent asthma 6  Age - 68 to 1 (4), > 70  (6)    TOTAL    Risk Stratifcation scores  - < 10 (0.5%), 11-19 (1.8%), 20-27 (4.2%), 28-40 (10.1%), >40 (26.6%)  0.5%      Allergies as of 03/29/2024       Reactions   Vancomycin Shortness Of Breath   Lisinopril Rash        Medication List         Accurate as of March 29, 2024  2:49 PM. If you have any questions, ask your nurse or doctor.          acetaminophen  500 MG tablet Commonly known as: TYLENOL  Take 500 mg by mouth every 6 (six) hours as needed for fever.   albuterol  108 (90 Base) MCG/ACT inhaler Commonly known as: Proventil  HFA Inhale 2 puffs into the lungs every 4 (four) hours as needed for wheezing or shortness of breath.   aspirin  EC 81 MG tablet Take 81 mg by mouth every morning.   atorvastatin  80 MG tablet Commonly known as: LIPITOR TAKE 1 TABLET BY MOUTH EVERY DAY   B-D ULTRAFINE III SHORT PEN 31G X 8 MM Misc Generic drug: Insulin  Pen Needle USE 5 TIMES DAILY AS  DIRECTED   bisoprolol  10 MG tablet Commonly known as: ZEBETA  TAKE 1 TABLET BY MOUTH EVERY DAY   Breztri  Aerosphere 160-9-4.8 MCG/ACT Aero inhaler Generic drug: budesonide -glycopyrrolate -formoterol  Inhale 2 puffs into the lungs in the morning and at bedtime.   Cholecalciferol  25 MCG (1000 UT) capsule Take 1,000 Units by mouth daily.   digoxin  0.125 MG tablet Commonly known as: LANOXIN  TAKE 1 TABLET BY MOUTH EVERY DAY   Entresto  97-103 MG Generic drug: sacubitril -valsartan  TAKE 1 TABLET BY MOUTH TWICE A DAY. STOP LOSARTAN    ezetimibe  10 MG tablet Commonly known as: ZETIA  Take 1 tablet (10 mg total) by mouth daily.   fluticasone  50 MCG/ACT nasal spray Commonly known as: FLONASE  Place 2 sprays into both nostrils daily as needed for allergies.   glucagon (human recombinant) 1 MG injection Commonly known as: GLUCAGEN Inject into the muscle as needed.   insulin  aspart 100 UNIT/ML FlexPen Commonly known as: NOVOLOG  Inject 18-20-22 u TID AC plus sliding scale for up to 75 units TDD   loratadine  10 MG tablet Commonly known as: CLARITIN  Take 10 mg by mouth daily as needed.   losartan  100 MG tablet Commonly known as: COZAAR  every evening.   Multi-Vitamin tablet Take 1 tablet by mouth daily.   naproxen sodium 220 MG  tablet Commonly known as: ALEVE Take 220 mg by mouth daily as needed.   pantoprazole  40 MG tablet Commonly known as: PROTONIX  TAKE 1 TABLET BY MOUTH EVERY DAY   spironolactone  25 MG tablet Commonly known as: ALDACTONE  TAKE 1 TABLET (25 MG TOTAL) BY MOUTH DAILY.   torsemide  20 MG tablet Commonly known as: DEMADEX  Take 3 tablets (60 mg total) by mouth daily.   traMADol  50 MG tablet Commonly known as: ULTRAM  Take 50 mg by mouth every 4 (four) hours as needed.   Tresiba  FlexTouch 200 UNIT/ML FlexTouch Pen Generic drug: insulin  degludec Inject 60 Units into the skin daily.        MEDICATION ADJUSTMENTS/LABS AND TESTS ORDERED: tart Trelegy inhaler 200, 1 puff once a day Please rinse mouth after use Continue to use albuterol  as needed  Recommend obtaining CBC with differential Recommend obtaining IgE levels Referral to GI for reflux Start Protonix  40 mg daily Referral to mask fitting desensitization protocol Avoid Allergens and Irritants Avoid secondhand smoke Avoid SICK contacts Recommend  Masking  when appropriate Recommend Keep up-to-date with vaccinations   CURRENT MEDICATIONS REVIEWED AT LENGTH WITH PATIENT TODAY   Patient  satisfied with Plan of action and management. All questions answered   Follow up 6 months   I spent a total of 58  minutes reviewing chart data, face-to-face evaluation with the patient, counseling and coordination of care as detailed above.      Lady Pier, M.D.  [facility]monary [facility] Critical [facility]  Medical Director [facility] Medical Director [facility] Cardio-Pulmonary Department        Sundra Engel Nestora Baptise, M.D.  [facility] Pulmonary & Critical Care Medicine  Medical Director [facility] Medical Director [facility] Cardio-Pulmonary Department

## 2024-03-29 NOTE — Patient Instructions (Addendum)
 Continue Breztri  as prescribed Please rinse mouth after use Continue albuterol  as needed Avoid Allergens and Irritants Avoid secondhand smoke Avoid SICK contacts Recommend  Masking  when appropriate Recommend Keep up-to-date with vaccinations  Regarding surgery You are a low to moderate risk for lung complications  Recommend referral to [employer] company

## 2024-04-16 ENCOUNTER — Telehealth: Payer: Self-pay

## 2024-04-16 NOTE — Telephone Encounter (Signed)
  ADVANCED HEART FAILURE CLINIC   Pre-operative Risk Assessment   HEARTCARE STAFF-IMPORTANT INSTRUCTIONS 1 Red and Blue Text will auto delete once note is signed or closed. 2 Press F2 to navigate through template.   3 On drop down lists, L click to select >> R click to activate next field 4 Reason for Visit format is IMPORTANT!!  See Directions on No. 2 below. 5 Please review chart to determine if there is already a clearance note open for this procedure!!  DO NOT duplicate if a note already exists!!    :1}      Request for Surgical Clearance    Procedure:  excision of pilonidal cyst  Date of Surgery:  Clearance TBD                                 Surgeon:  Vicenta Gee, MD Surgeon's Group or Practice Name:  [facility] Phone number:  [phone] Fax number:  [phone]   Type of Clearance Requested:   - Medical    Type of Anesthesia:  Not Indicated   Additional requests/questions:    Signed, Joseph [name]   04/16/2024, 2:31 PM   Advanced Heart Failure Clinic Joseph [name] [facility] [address] [facility] [place] [address] [phone] (office) [phone] (fax)

## 2024-04-17 NOTE — Telephone Encounter (Signed)
 Low risk surgery, think he can do this without further workup.

## 2024-04-18 ENCOUNTER — Telehealth: Payer: Self-pay | Admitting: Family

## 2024-04-18 NOTE — Telephone Encounter (Signed)
 Called to confirm/remind patient of their appointment at the [facility] Failure [facility] on 04/19/24.   Appointment:   [x] Confirmed  [] Left mess   [] No answer/No voice mail  [] VM Full/unable to leave message  [] Phone not in service  Patient reminded to bring all medications and/or complete list.  Confirmed patient has transportation. Gave directions, instructed to utilize valet parking.

## 2024-04-19 ENCOUNTER — Ambulatory Visit (HOSPITAL_BASED_OUTPATIENT_CLINIC_OR_DEPARTMENT_OTHER): Admitting: Cardiology

## 2024-04-19 ENCOUNTER — Other Ambulatory Visit
Admission: RE | Admit: 2024-04-19 | Discharge: 2024-04-19 | Disposition: A | Discharge status: Home / Self Care | Payer: Self-pay | Source: Ambulatory Visit | Attending: Cardiology | Admitting: Cardiology

## 2024-04-19 VITALS — BP 129/70 | HR 78 | Wt 218.0 lb

## 2024-04-19 DIAGNOSIS — Z8616 Personal history of COVID-19: Secondary | ICD-10-CM | POA: Insufficient documentation

## 2024-04-19 DIAGNOSIS — Z01818 Encounter for other preprocedural examination: Secondary | ICD-10-CM | POA: Diagnosis present

## 2024-04-19 DIAGNOSIS — I5022 Chronic systolic (congestive) heart failure: Secondary | ICD-10-CM

## 2024-04-19 DIAGNOSIS — E109 Type 1 diabetes mellitus without complications: Secondary | ICD-10-CM | POA: Insufficient documentation

## 2024-04-19 DIAGNOSIS — I252 Old myocardial infarction: Secondary | ICD-10-CM | POA: Diagnosis not present

## 2024-04-19 DIAGNOSIS — Z79899 Other long term (current) drug therapy: Secondary | ICD-10-CM | POA: Diagnosis not present

## 2024-04-19 DIAGNOSIS — Z794 Long term (current) use of insulin: Secondary | ICD-10-CM | POA: Diagnosis not present

## 2024-04-19 DIAGNOSIS — J45909 Unspecified asthma, uncomplicated: Secondary | ICD-10-CM | POA: Insufficient documentation

## 2024-04-19 DIAGNOSIS — G4733 Obstructive sleep apnea (adult) (pediatric): Secondary | ICD-10-CM | POA: Diagnosis not present

## 2024-04-19 DIAGNOSIS — E785 Hyperlipidemia, unspecified: Secondary | ICD-10-CM | POA: Diagnosis not present

## 2024-04-19 DIAGNOSIS — I110 Hypertensive heart disease with heart failure: Secondary | ICD-10-CM | POA: Insufficient documentation

## 2024-04-19 DIAGNOSIS — I2510 Atherosclerotic heart disease of native coronary artery without angina pectoris: Secondary | ICD-10-CM | POA: Diagnosis not present

## 2024-04-19 DIAGNOSIS — Z95810 Presence of automatic (implantable) cardiac defibrillator: Secondary | ICD-10-CM | POA: Diagnosis not present

## 2024-04-19 LAB — BRAIN NATRIURETIC PEPTIDE: B Natriuretic Peptide: 45.7 pg/mL (ref 0.0–100.0)

## 2024-04-19 LAB — BASIC METABOLIC PANEL WITH GFR
Anion gap: 12 (ref 5–15)
BUN: 24 mg/dL — ABNORMAL HIGH (ref 6–20)
CO2: 25 mmol/L (ref 22–32)
Calcium: 9.2 mg/dL (ref 8.9–10.3)
Chloride: 103 mmol/L (ref 98–111)
Creatinine, Ser: 1.29 mg/dL — ABNORMAL HIGH (ref 0.61–1.24)
GFR, Estimated: >60 mL/min (ref >60)
Glucose, Bld: 153 mg/dL — ABNORMAL HIGH (ref 70–99)
Potassium: 4.1 mmol/L (ref 3.5–5.1)
Sodium: 140 mmol/L (ref 135–145)

## 2024-04-19 NOTE — Progress Notes (Signed)
 [facility] Progress Note    PCP: Dr. Sadie Cardiology: Dr. Ammon HF Cardiology: Dr. Rolan  Chief complaint: CHF  53 y.o. with history of type 1 DM, HTN, CAD, chronic systolic CHF, severe asthma, OSA not adherent with CPAP.  Patient had anterior STEMI with DES to proximal LAD in 6/11.  Echo at the time with EF < 35%.  Patient eventually had [employer] ICD placed.  LV systolic function has been low since 6/11 MI.  He presented again in 2/18 with chest pain and dyspnea as well as anterior STE on ECG, but cath showed only moderate proximal LAD in-stent restenosis, not explaining his ECG changes.  Most recent echo in 1/23 showed EF 25-30%, normal RV, mild-moderate MR.   CPX 5/23>>Submaximal test. Moderate to severe functional limitation due to a combination of HF and moderate to severe restrictive lung physiology due to patient's body habitus. Chronotropic incompetence was noted. Suspect lung limitation predominates.   Diagnosed w/ COVID 8/23 but did not require hospitalization. Patient had COVID-19 again in 1/24.    Echo in 2/24 showed EF 35%, septal/apical akinesis, normal RV.   Echo in 3/24 as part of barostimulation activation therapy workup showed EF 30-35%, wall motion abnormalities consistent with LAD-territory MI, mild LV dilation, normal RV, normal IVC.   He returns for followup of CHF.  Not using CPAP, mask is too uncomfortable. Weight stable. He has asthma in addition to CHF and wakes up with coughing and phlegm in the mornings.  He is short of breath with stairs and inclines.  He is short of breath mowing the grass and weed-eating.  No dyspnea walking on flat ground.  No chest pain.  Symptoms are stable.    ECG (personally reviewed): NSR, RBBB  Labs (8/24): LDL 89, BNP 53 Labs (9/24): K 4.4, creatinine 1.11 Labs (12/24): K 4.9, creatinine 1.3, LDL 89, TGs 209 Labs (1/25): K 4.5, creatinine 1.33 Labs (4/25): BNP 40, digoxin  0.8, K 4.4, creatinine  1.12 Labs (5/25): hgb 12.5  PMH: 1. Type 1 diabetes: Diagnosis in 1997. H/o DKA.  2. GERD 3. Hyperlipidemia 4. HTN 5. OSA: Has CPAP.  6. H/o seizure 7. CAD: 6/11 anterior MI with PCI to LAD.  - 2/18 STEMI by ECG => cath with moderate in-stent restenosis in the ostial/proximal LAD stent but did not explain ECG changes (?recanalized).   8. Chronic systolic CHF: Ischemic cardiomyopathy. [employer] ICD.  - Echo (6/11): EF < 35%.  - Echo (2017): EF 40% - Echo (2/18): EF 25-30% - Echo (1/23): EF 25-30%, normal RV, mild-moderate MR.  - CPX (5/23): VE/VCO2 slope 31, VO2 15.8, RER 1.0.  Submaximal. Probably mild HF limitation with further limitation due to deconditioning and moderate restriction by spirometry.  - Echo (2/24): EF 35%, septal/apical akinesis, normal RV.   - Echo (3/24): EF 30-35%, wall motion abnormalities consistent with LAD-territory MI, mild LV dilation, normal RV, normal IVC.  9. Severe asthma - Follows with pulmonary  Social History   Socioeconomic History   Marital status: Divorced    Spouse name: Not on file   Number of children: Not on file   Years of education: Not on file   Highest education level: Not on file  Occupational History   Occupation: Location manager  Tobacco Use   Smoking status: Never   Smokeless tobacco: Never  Vaping Use   Vaping status: Never Used  Substance and Sexual Activity   Alcohol use: No   Drug use: No  Sexual activity: Yes  Other Topics Concern   Not on file  Social History Narrative   Not on file   Social Drivers of Health   Financial Resource Strain: Low Risk  (01/19/2024)   Received [facility]   Overall Financial Resource Strain (CARDIA)    Difficulty of Paying Living Expenses: Not hard at all  Food Insecurity: No Food Insecurity (01/19/2024)   Received from [facility]   Hunger Vital Sign    Within the past 12 months, you worried that your food would run out before you  got the money to buy more.: Never true    Within the past 12 months, the food you bought just didn't last and you didn't have money to get more.: Never true  Transportation Needs: No Transportation Needs (01/19/2024)   Received from [facility] - Transportation    In the past 12 months, has lack of transportation kept you from medical appointments or from getting medications?: No    Lack of Transportation (Non-Medical): No  Physical Activity: Not on file  Stress: Not on file  Social Connections: Not on file  Intimate Partner Violence: Not on file   Family History  Problem Relation Age of Onset   Hypertension Mother    Heart attack Paternal Grandmother    Heart attack Father    Bladder Cancer Neg Hx    Kidney cancer Neg Hx    Prostate cancer Neg Hx    ROS: all systems reviewed and negative except as per HPI.   Current Outpatient Medications  Medication Sig Dispense Refill   acetaminophen  (TYLENOL ) 500 MG tablet Take 500 mg by mouth every 6 (six) hours as needed for fever.     albuterol  (PROVENTIL  HFA) 108 (90 Base) MCG/ACT inhaler Inhale 2 puffs into the lungs every 4 (four) hours as needed for wheezing or shortness of breath. 1 Inhaler 0   aspirin  EC 81 MG tablet Take 81 mg by mouth every morning.     atorvastatin  (LIPITOR) 80 MG tablet TAKE 1 TABLET BY MOUTH EVERY DAY 90 tablet 2   bisoprolol  (ZEBETA ) 10 MG tablet TAKE 1 TABLET BY MOUTH EVERY DAY 90 tablet 3   budesonide -glycopyrrolate -formoterol  (BREZTRI  AEROSPHERE) 160-9-4.8 MCG/ACT AERO inhaler Inhale 2 puffs into the lungs in the morning and at bedtime. 10.7 g 11   budesonide -glycopyrrolate -formoterol  (BREZTRI  AEROSPHERE) 160-9-4.8 MCG/ACT AERO inhaler Inhale 2 puffs into the lungs in the morning and at bedtime.     cetirizine-pseudoephedrine (ZYRTEC-D) 5-120 MG tablet Take by mouth.     Cholecalciferol  25 MCG (1000 UT) capsule Take 1,000 Units by mouth daily.     digoxin  (LANOXIN ) 0.125 MG tablet  TAKE 1 TABLET BY MOUTH EVERY DAY 90 tablet 0   ezetimibe  (ZETIA ) 10 MG tablet Take 1 tablet (10 mg total) by mouth daily. 90 tablet 3   fluticasone  (FLONASE ) 50 MCG/ACT nasal spray Place 2 sprays into both nostrils daily as needed for allergies.      glucagon, human recombinant, (GLUCAGEN) 1 MG injection Inject into the muscle as needed.     insulin  aspart (NOVOLOG ) 100 UNIT/ML FlexPen Inject 18-20-22 u TID AC plus sliding scale for up to 75 units TDD     insulin  degludec (TRESIBA  FLEXTOUCH) 200 UNIT/ML FlexTouch Pen Inject 60 Units into the skin daily.     Insulin  Pen Needle (B-D ULTRAFINE III SHORT PEN) 31G X 8 MM MISC USE 5 TIMES DAILY AS  DIRECTED 75  each 0   loratadine  (CLARITIN ) 10 MG tablet Take 10 mg by mouth daily as needed.      Multiple Vitamin (MULTI-VITAMIN) tablet Take 1 tablet by mouth daily.     naproxen sodium (ALEVE) 220 MG tablet Take 220 mg by mouth daily as needed.     pantoprazole  (PROTONIX ) 40 MG tablet TAKE 1 TABLET BY MOUTH EVERY DAY 90 tablet 3   sacubitril -valsartan  (ENTRESTO ) 97-103 MG TAKE 1 TABLET BY MOUTH TWICE A DAY. STOP LOSARTAN  180 tablet 3   spironolactone  (ALDACTONE ) 25 MG tablet TAKE 1 TABLET (25 MG TOTAL) BY MOUTH DAILY. 90 tablet 3   torsemide  (DEMADEX ) 20 MG tablet Take 3 tablets (60 mg total) by mouth daily. 90 tablet 5   traMADol  (ULTRAM ) 50 MG tablet Take 50 mg by mouth every 4 (four) hours as needed.     No current facility-administered medications for this visit.   BP 129/70   Pulse 78   Wt 218 lb (98.9 kg)   SpO2 98%   BMI 28.76 kg/m  PHYSICAL EXAM: General: NAD Neck: No JVD, no thyromegaly or thyroid nodule.  Lungs: Prolonged expiratory phase.  CV: Nondisplaced PMI.  Heart regular S1/S2, no S3/S4, no murmur.  No peripheral edema.  No carotid bruit.  Normal pedal pulses.  Abdomen: Soft, nontender, no hepatosplenomegaly, no distention.  Skin: Intact without lesions or rashes.  Neurologic: Alert and oriented x 3.  Psych: Normal  affect. Extremities: No clubbing or cyanosis.  HEENT: Normal.   Assessment/Plan: 1. CAD: S/p anterior MI in 2011. Denies chest pain  - Continue ASA 81 daily.  - Continue Atorvastatin  80 mg daily.  Needs lipids with next labs.  2. Chronic systolic CHF: Ischemic cardiomyopathy with [employer] ICD.  EF has been low since 2011.  Most recent echo in 3/24 with EF 30-35%, normal RV. CPX 5/23 was a submaximal, mild HF limitation but additional functional limitation due to deconditioning and asthma.  NYHA class III chronically, symptoms confounded by significant asthma (I think this is the source of his current congestion and coughing). He is not volume overloaded on exam.  - Continue torsemide  60 mg daily. BMET/BNP today. - Continue bisoprolol  10 mg daily.  - Continue spironolactone  25 mg daily.  - Continue digoxin , check level today.  - Continue Entresto  97/103 bid.   - No SGLT2 inhibitor due to type 1 diabetes.   - He was unable to tolerate low dose Bidil (0.5 tab tid) due to orthostatic symptoms. - Insurance denied barostimulator placement.  He would be a good candidate for a cardiac contractility modulator. He saw Dr. Cindie about this and is waiting to see if he can get insurance coverage.     - QRS not wide enough to benefit from CRT.  - He needs a repeat echo, I will order.  3. Type 1 diabetes: Following with Endocrine - Has not been well-controlled historically.  4. Asthma:  Severe per prior notes.   5. OSA: Has difficulty with CPAP.   6. Hyperlipidemia: Goal LDL < 55 LDL   - Continue atorvastatin  80 mg daily and Zetia  10 mg daily, needs lipids checked. .     F/u 3 months   I spent 32 minutes reviewing records, interviewing/examining patient, and managing orders.    Ezra Shuck,  04/19/2024

## 2024-04-19 NOTE — Patient Instructions (Signed)
 Medication Changes:  No medication changes today!  Lab Work:  Go over to the [facility]ICAL [facility]ALL. Go pass the gift shop and have your blood work completed.  We will only call you if the results are abnormal or if the provider would like to make medication changes.   Follow-Up in: Please follow up with the [facility] in 3 months with Dr. Rolan.   At the [facility], you and your health needs are our priority. We have a designated team specialized in the treatment of Heart Failure. This Care Team includes your primary Heart Failure Specialized Cardiologist (physician), Advanced Practice Providers (APPs- Physician Assistants and Nurse Practitioners), and Pharmacist who all work together to provide you with the care you need, when you need it.   You may see any of the following providers on your designated Care Team at your next follow up:  Dr. Toribio Fuel Dr. Ezra Rolan Dr. Ria Commander Dr. Odis Brownie Ellouise Class, FNP Jaun Bash, RPH-CPP  Please be sure to bring in all your medications bottles to every appointment.   Need to Contact Us :  If you have any questions or concerns before your next appointment please send us  a message through [facility] or call our office at [phone].    TO LEAVE A MESSAGE FOR THE NURSE SELECT OPTION 2, PLEASE LEAVE A MESSAGE INCLUDING: YOUR NAME DATE OF BIRTH CALL BACK NUMBER REASON FOR CALL**this is important as we prioritize the call backs  YOU WILL RECEIVE A CALL BACK THE SAME DAY AS LONG AS YOU CALL BEFORE 4:00 PM

## 2024-04-22 ENCOUNTER — Ambulatory Visit (HOSPITAL_COMMUNITY): Payer: Self-pay | Admitting: Cardiology

## 2024-05-11 NOTE — Progress Notes (Signed)
 SURGERY CONSULT NOTE:  05/11/2024  Service Providing Consult: General Surgery Consulting Attending: Vicenta Gee, MD   Impression: Patient is a 53 y.o. male with a pilonidal cyst and extensive heart disease.  Will await to see what his yet to be scheduled ECHO shows before moving forward with any recommendations for surgery.  Pilonidal area is quiesent currently  Recommendations:  Patient ot schedule ECHO as requested by his heart doctors.  Will re-evaluate after ECHO    History of Present Illness: Reason for Consult:  Pilonidal cyst  Joseph [name] is seen in consultation for a pilonidal cyst that first flaired up in Sept of 2024.  It was lanced at that time and then flaired up again in May of 2025.  It was lanced at that [facility] and healed.  No problems since.    Allergies: Allergies[1]  Medications: Current Rx[2]   Past Medical History: Past Medical History[3]  Past Surgical History: Past Surgical History[4]  Family History: Family History[5]  Social History: Short Social History[6]  Review of Systems 10 systems were reviewed and are negative except as noted specifically in the HPI.  Objective BP 123/56 (BP Position: Sitting)   Pulse 78   Ht 185.4 cm (6' 1)   Wt 99.4 kg (219 lb 1.6 oz)   SpO2 98%   BMI 28.91 kg/m    Physical Exam General:  No acute distress, well appearing and well nourished.  Head:  Normocephalic, atraumatic.  Eyes:  Conjuctiva and lids appear normal. Pupils equal and round, sclera anicteric.  Ears:  Overall appearance normal with no scars, lesions or masses. Hearing is grossly normal.  Nose: Nares grossly normal, no drainage.  Throat: Lips, mucosa, and tongue normal; teeth and gums normal.  Neck: Supple, symmetrical, trachea midline  Pulmonary:    Normal respiratory effort.  Lungs were clear to auscultation bilaterally.  Cardiovascular:  Regular rate and rhythm, no murmur noted.   Abdomen:   Soft and nontender.  No masses. No HSM.   Pilonidal was non inflammed.  He had some pits but no mass present (see picture)  Musculoskeletal:  Extremities without clubbing, cyanosis, or edema.  Skin: Skin color, texture, turgor normal, no rashes or lesions.  Neurologic: No motor abnormalities noted.  Sensation grossly intact.  Psychiatric: Judgement and insight appropriate. Oriented to person, place, and time.    Most Recent Labs: No results found for: WBC, HGB, HCT, PLT  Lab Results  Component Value Date   CREATININE 0.80 03/30/2023    IMAGING: ICD / Pacemaker Evaluation Result Date: 03/30/2023 Dorise Aleck LABOR RN     03/30/2023  2:47 PM Recommendations for this procedure: If using monopolar electrocautery, follow directions below. Place magnet over device. Pt does not need to be seen post op. Perioperative Cardiac Implanted Device evaluation Procedure: INSERTION MULTI-COMPONENT INFLATABLE PENILE PROSTHESIS INCLUDING PLACEMENT OF PUMP, CYLINDERS & RESERVOIR Location of Device: Left chest Device Manufacturer/Type: [employer] ICD Underlying Rhythm: NSR Dependent:  No Mode: VVI Lower Rate: 40 Tachy Zones: Surgical Procedure above Iliac Crest    Surgical Procedure Iliac Crest and Below <6 inches from ICD: REPROGRAM DEVICE   ICD: NO CHANGE <6 inches from PM: Reprogram if dependent    PM:  NO CHANGE *If Monopolar Electrocautery and Magnet in [employer]* >6 inches from ICD:  Secure Magnet over ICD >6 inches from PM: Have Magnet available Bipolar Cautery:  NO CHANGES NEEDED Magnet Responses of Devices    Pacemaker  Rate    ICD/PM Tone (Normal battery)  [place] VOO/DOO 100 bpm Deactivate Therapies Tone with each heart beat Questions during the case, please call the Device RN via Vocera Dial *22-Ask for Device Nurse Dial [phone] to access Vocera system and ask for Device Nurse    ASSESSMENT:  Pilonidal is quiesient currently.  Will await further cardiac workup including a more recent ECHO  PLAN: ECHO of heart and then  re-evaluate surgical risk/benefit   Vicenta Gee, MD General Surgery/Surgical Oncology        [1] Allergies Allergen Reactions  . Vancomycin Shortness Of Breath  . Lisinopril Rash  [2] Current Outpatient Medications  Medication Sig Dispense Refill  . acetaminophen  (TYLENOL ) 500 MG tablet Take 1 tablet (500 mg total) by mouth every six (6) hours as needed.    . albuterol  HFA 90 mcg/actuation inhaler Inhale 2 puffs every four (4) hours as needed.    . aspirin  (ECOTRIN) 81 MG tablet Take 1 tablet (81 mg total) by mouth.    . BD ULTRA-FINE SHORT PEN NEEDLE 31 gauge x 5/16 (8 mm) Ndle USE AS DIRECTED 5 TIMES DAILY    . bisoprolol  (ZEBETA ) 10 MG tablet Take 1 tablet (10 mg total) by mouth every evening.    . BREZTRI  AEROSPHERE 160-9-4.8 mcg/actuation inhaler Inhale 2 puffs.    . cephalexin (KEFLEX) 500 MG capsule take 1 capsule by mouth four times a day for 7 days    . cetirizine-pseudoephedrine (ZYRTEC-D) 5-120 mg per tablet Take by mouth.    . cholecalciferol , vitamin D3, 2,000 unit cap Take 1,000 Units by mouth.    . digoxin  (LANOXIN ) 125 mcg tablet Take 1 tablet (125 mcg total) by mouth.    . ENTRESTO  97-103 mg tablet TAKE 1 TABLET BY MOUTH TWICE A DAY. STOP LOSARTAN     . ezetimibe  (ZETIA ) 10 mg tablet Take 1 tablet (10 mg total) by mouth daily.    . fluticasone  (FLONASE ) 50 mcg/actuation nasal spray 2 sprays into each nostril.    SABRA glucagon (GLUCAGEN DIAGNOSTIC KIT) 1 mg/mL SolR Inject 1 mL (1 mg total) into the muscle once.    . insulin  ASPART (NOVOLOG  FLEXPEN U-100 INSULIN ) 100 unit/mL (3 mL) injection pen Inject 18-20-22 u TID AC plus sliding scale for up to 75 units TDD    . insulin  lispro (HUMALOG  KWIKPEN INSULIN ) 100 unit/mL injection pen Humalog  KwikPen Insulin  100 unit/mL insulin  pen    . loratadine  10 mg cap Take 10 mg by mouth daily as needed.    . losartan  (COZAAR ) 100 MG tablet every evening.    . metoclopramide  (REGLAN ) 10 MG tablet TAKE ONE TABLET BY MOUTH 4 TIMES  DAILY BEFORE  MEALS    . multivitamin (TAB-A-VITE/THERAGRAN) per tablet Take 1 tablet by mouth daily.    . pantoprazole  (PROTONIX ) 20 MG tablet Take 1 tablet (20 mg total) by mouth.    . sodium chloride  (AYR) 0.65 % Drop 1 spray into each nostril.    . spironolactone  (ALDACTONE ) 25 MG tablet Take 0.5 tablets (12.5 mg total) by mouth every evening.    . torsemide  (DEMADEX ) 20 MG tablet Take 3 tablets (60 mg total) by mouth daily.    . traMADoL  (ULTRAM ) 50 mg tablet Take 1 tablet (50 mg total) by mouth every four (4) hours as needed.    . TRESIBA  FLEXTOUCH U-200 200 unit/mL (3 mL) InPn Inject 0.3 mL (60 Units total) under the skin daily.     No current facility-administered medications for this visit.  [3] Past Medical History: Diagnosis Date  .  Asthma (HHS-HCC)   . Chronic systolic HF (heart failure)      . GERD (gastroesophageal reflux disease)   . HLD (hyperlipidemia)   . Hypertension   . Implantable cardioverter-defibrillator (ICD) in situ   . OSA (obstructive sleep apnea)   . Prostate cancer       s/p radical prostatectomy  . STEMI (ST elevation myocardial infarction)    2011   s/p stent x 1  . Type 1 diabetes      [4] Past Surgical History: Procedure Laterality Date  . [employer] ICD    . CORONARY ANGIOPLASTY WITH STENT PLACEMENT  2011  . Intravitreal Injection Right   . PR INSERT,INFLATABLE PENILE PROSTHESIS N/A 03/30/2023   Procedure: INSERTION MULTI-COMPONENT INFLATABLE PENILE PROSTHESIS INCLUDING PLACEMENT OF PUMP, CYLINDERS & RESERVOIR;  Surgeon: Sharl Oliva Rush, MD;  Location: [facility];  Service: Urology  . PROSTATECTOMY    [5] Family History Problem Relation Age of Onset  . Hypertension Mother   . Diabetes Maternal Grandfather   [6] Social History Tobacco Use  . Smoking status: Never  . Smokeless tobacco: Never  Substance Use Topics  . Alcohol use: No  . Drug use: No

## 2024-05-22 ENCOUNTER — Other Ambulatory Visit: Payer: Self-pay

## 2024-05-23 ENCOUNTER — Other Ambulatory Visit (HOSPITAL_COMMUNITY): Payer: Self-pay | Admitting: Cardiology

## 2024-06-27 ENCOUNTER — Other Ambulatory Visit (HOSPITAL_COMMUNITY): Payer: Self-pay | Admitting: Cardiology

## 2024-06-27 ENCOUNTER — Other Ambulatory Visit: Payer: Self-pay

## 2024-07-09 ENCOUNTER — Telehealth: Payer: Self-pay | Admitting: Cardiology

## 2024-07-16 ENCOUNTER — Telehealth: Payer: Self-pay

## 2024-07-16 NOTE — Telephone Encounter (Signed)
 Pt arrived to clinic to receive samples of entresto  because he is in between insurances.   Medication Samples have been provided to the patient.  Drug name: entresto        Strength: 49/51        Qty: 4 bottles  LOT: [id number]  Exp.Date: NOV 2026  Dosing instructions: take 2 tabs twice daily  The patient has been instructed regarding the correct time, dose, and frequency of taking this medication, including desired effects and most common side effects.   Joseph [name] 3:40 PM 07/16/2024

## 2024-08-03 ENCOUNTER — Telehealth: Payer: Self-pay | Admitting: Internal Medicine

## 2024-08-03 NOTE — Telephone Encounter (Signed)
 Dr. Isaiah place an order to renew cpap supplies and the order was sent to [facility]. We received a note from [employer] the patient has been unresponsive to multiple contact attempts for renew cpap supplies. It looks like the patient was using [employer]

## 2024-08-19 ENCOUNTER — Other Ambulatory Visit (HOSPITAL_COMMUNITY): Payer: Self-pay | Admitting: Cardiology

## 2024-08-22 ENCOUNTER — Other Ambulatory Visit: Payer: Self-pay

## 2024-08-22 ENCOUNTER — Other Ambulatory Visit: Payer: Self-pay | Admitting: Internal Medicine

## 2024-08-22 DIAGNOSIS — J4540 Moderate persistent asthma, uncomplicated: Secondary | ICD-10-CM

## 2024-08-22 MED ORDER — BUDESONIDE-FORMOTEROL FUMARATE 160-4.5 MCG/ACT IN AERO: 2.0000 | INHALATION_SPRAY | Freq: Two times a day (BID) | RESPIRATORY_TRACT | 12 refills | Status: AC

## 2024-08-22 NOTE — Progress Notes (Signed)
 Insurance company provided limited list, Symbicort  ordered

## 2024-08-23 ENCOUNTER — Other Ambulatory Visit: Payer: Self-pay

## 2024-08-30 ENCOUNTER — Ambulatory Visit
Admission: RE | Admit: 2024-08-30 | Discharge: 2024-08-30 | Disposition: A | Discharge status: Home / Self Care | Payer: Self-pay | Attending: Gastroenterology | Admitting: Gastroenterology

## 2024-08-30 ENCOUNTER — Encounter: Admission: RE | Disposition: A | Payer: Self-pay | Source: Home / Self Care | Attending: Gastroenterology

## 2024-08-30 ENCOUNTER — Encounter: Payer: Self-pay | Admitting: Gastroenterology

## 2024-08-30 ENCOUNTER — Ambulatory Visit: Payer: Self-pay

## 2024-08-30 DIAGNOSIS — J45909 Unspecified asthma, uncomplicated: Secondary | ICD-10-CM | POA: Diagnosis not present

## 2024-08-30 DIAGNOSIS — I110 Hypertensive heart disease with heart failure: Secondary | ICD-10-CM | POA: Insufficient documentation

## 2024-08-30 DIAGNOSIS — I2510 Atherosclerotic heart disease of native coronary artery without angina pectoris: Secondary | ICD-10-CM | POA: Insufficient documentation

## 2024-08-30 DIAGNOSIS — K2100 Gastro-esophageal reflux disease with esophagitis, without bleeding: Secondary | ICD-10-CM | POA: Diagnosis not present

## 2024-08-30 DIAGNOSIS — R1013 Epigastric pain: Secondary | ICD-10-CM | POA: Insufficient documentation

## 2024-08-30 DIAGNOSIS — Z794 Long term (current) use of insulin: Secondary | ICD-10-CM | POA: Insufficient documentation

## 2024-08-30 DIAGNOSIS — I5022 Chronic systolic (congestive) heart failure: Secondary | ICD-10-CM | POA: Diagnosis not present

## 2024-08-30 DIAGNOSIS — G4733 Obstructive sleep apnea (adult) (pediatric): Secondary | ICD-10-CM | POA: Insufficient documentation

## 2024-08-30 DIAGNOSIS — K3189 Other diseases of stomach and duodenum: Secondary | ICD-10-CM | POA: Insufficient documentation

## 2024-08-30 DIAGNOSIS — R6881 Early satiety: Secondary | ICD-10-CM | POA: Insufficient documentation

## 2024-08-30 DIAGNOSIS — E119 Type 2 diabetes mellitus without complications: Secondary | ICD-10-CM | POA: Diagnosis not present

## 2024-08-30 DIAGNOSIS — I252 Old myocardial infarction: Secondary | ICD-10-CM | POA: Diagnosis not present

## 2024-08-30 HISTORY — PX: ESOPHAGOGASTRODUODENOSCOPY: SHX5428

## 2024-08-30 SURGERY — EGD (ESOPHAGOGASTRODUODENOSCOPY)
Anesthesia: General

## 2024-08-30 MED ORDER — PROPOFOL 500 MG/50ML IV EMUL
INTRAVENOUS | Status: DC | PRN
  Administered 2024-08-30: 150 ug/kg/min via INTRAVENOUS

## 2024-08-30 MED ORDER — DEXMEDETOMIDINE HCL IN NACL 80 MCG/20ML IV SOLN
INTRAVENOUS | Status: DC | PRN
  Administered 2024-08-30: 8 ug via INTRAVENOUS

## 2024-08-30 MED ORDER — MIDAZOLAM HCL 2 MG/2ML IJ SOLN
INTRAMUSCULAR | Status: AC
  Filled 2024-08-30: qty 2

## 2024-08-30 MED ORDER — SODIUM CHLORIDE 0.9 % IV SOLN
INTRAVENOUS | Status: DC
  Administered 2024-08-30: 500 mL via INTRAVENOUS

## 2024-08-30 MED ORDER — MIDAZOLAM HCL (PF) 2 MG/2ML IJ SOLN
INTRAMUSCULAR | Status: DC | PRN
  Administered 2024-08-30: 2 mg via INTRAVENOUS

## 2024-08-30 MED ORDER — GLYCOPYRROLATE 0.2 MG/ML IJ SOLN
INTRAMUSCULAR | Status: DC | PRN
  Administered 2024-08-30: .2 mg via INTRAVENOUS

## 2024-08-30 MED ORDER — LIDOCAINE HCL (CARDIAC) PF 100 MG/5ML IV SOSY
PREFILLED_SYRINGE | INTRAVENOUS | Status: DC | PRN
  Administered 2024-08-30: 80 mg via INTRAVENOUS

## 2024-08-30 MED ORDER — PROPOFOL 10 MG/ML IV BOLUS
INTRAVENOUS | Status: DC | PRN
  Administered 2024-08-30: 20 mg via INTRAVENOUS
  Administered 2024-08-30: 40 mg via INTRAVENOUS

## 2024-08-30 NOTE — Transfer of Care (Signed)
 Immediate Anesthesia Transfer of Care Note  Patient: Joseph [name]  Procedure(s) Performed: EGD (ESOPHAGOGASTRODUODENOSCOPY)  Patient Location: Endoscopy Unit  Anesthesia Type:General  Level of Consciousness: drowsy  Airway & Oxygen Therapy: Patient Spontanous Breathing  Post-op Assessment: Report given to RN  Post vital signs: stable  Last Vitals:  Vitals Value Taken Time  BP 84/56 08/30/24 09:54  Temp    Pulse 70 08/30/24 09:54  Resp 20 08/30/24 09:54  SpO2 97 % 08/30/24 09:54    Last Pain:  Vitals:   08/30/24 0852  TempSrc: Temporal  PainSc: 0-No pain         Complications: No notable events documented.

## 2024-08-30 NOTE — Op Note (Signed)
 [facility] Gastroenterology Patient Name: Joseph [name] Procedure Date: 08/30/2024 9:23 AM MRN: [id number] Account #: [id number] Date of Birth: October 05, 1971 Admit Type: Outpatient Age: 53 Room: [facility] ENDO ROOM 3 Gender: Male Note Status: Finalized Instrument Name: Upper GI Scope [id number] Procedure:             Upper GI endoscopy Indications:           Dyspepsia, Follow-up of gastro-esophageal reflux                         disease Providers:             Corinn Jess Brooklyn MD, MD Referring MD:          [facility] Medicines:             General Anesthesia Complications:         No immediate complications. Estimated blood loss: None. Procedure:             Pre-Anesthesia Assessment:                        - Prior to the procedure, a History and Physical was                         performed, and patient medications and allergies were                         reviewed. The patient is competent. The risks and                         benefits of the procedure and the sedation options and                         risks were discussed with the patient. All questions                         were answered and informed consent was obtained.                         Patient identification and proposed procedure were                         verified by the physician, the nurse, the                         anesthesiologist, the anesthetist and the technician                         in the pre-procedure area in the procedure room in the                         endoscopy suite. Mental Status Examination: alert and                         oriented. Airway Examination: normal oropharyngeal                         airway and neck mobility. Respiratory Examination:  clear to auscultation. CV Examination: normal.                         Prophylactic Antibiotics: The patient does not require                         prophylactic antibiotics. Prior Anticoagulants: The                          patient has taken no anticoagulant or antiplatelet                         agents. ASA Grade Assessment: III - A patient with                         severe systemic disease. After reviewing the risks and                         benefits, the patient was deemed in satisfactory                         condition to undergo the procedure. The anesthesia                         plan was to use general anesthesia. Immediately prior                         to administration of medications, the patient was                         re-assessed for adequacy to receive sedatives. The                         heart rate, respiratory rate, oxygen saturations,                         blood pressure, adequacy of pulmonary ventilation, and                         response to care were monitored throughout the                         procedure. The physical status of the patient was                         re-assessed after the procedure.                        After obtaining informed consent, the endoscope was                         passed under direct vision. Throughout the procedure,                         the patient's blood pressure, pulse, and oxygen                         saturations were monitored continuously. The Endoscope  was introduced through the mouth, and advanced to the                         second part of duodenum. The upper GI endoscopy was                         accomplished without difficulty. The patient tolerated                         the procedure well. Findings:      The duodenal bulb and second portion of the duodenum were normal.      Multiple dispersed diminutive erosions with no bleeding and no stigmata       of recent bleeding were found in the gastric antrum. Biopsies were taken       with a cold forceps for histology.      The cardia and gastric fundus were normal on retroflexion.      The Z-line was irregular.       The gastroesophageal junction and examined esophagus were normal. Impression:            - Normal duodenal bulb and second portion of the                         duodenum.                        - Erosive gastropathy with no bleeding and no stigmata                         of recent bleeding. Biopsied.                        - Z-line irregular.                        - Normal gastroesophageal junction and esophagus. Recommendation:        - Await pathology results.                        - Discharge patient to home (with escort).                        - Resume previous diet today.                        - Continue present medications. Procedure Code(s):     --- Professional ---                        [phone], Esophagogastroduodenoscopy, flexible,                         transoral; with biopsy, single or multiple Diagnosis Code(s):     --- Professional ---                        K31.89, Other diseases of stomach and duodenum                        K22.89, Other specified disease of esophagus  R10.13, Epigastric pain                        K21.9, Gastro-esophageal reflux disease without                         esophagitis CPT copyright 2022 American Medical Association. All rights reserved. The codes documented in this report are preliminary and upon coder review may  be revised to meet current compliance requirements. Dr. Corinn Brooklyn Corinn Jess Brooklyn MD, MD 08/30/2024 9:51:22 AM This report has been signed electronically. Number of Addenda: 0 Note Initiated On: 08/30/2024 9:23 AM Estimated Blood Loss:  Estimated blood loss: none.      [facility]

## 2024-08-30 NOTE — Anesthesia Preprocedure Evaluation (Signed)
 Anesthesia Evaluation  Patient identified by MRN, date of birth, ID band Patient awake    Reviewed: Allergy & Precautions, NPO status , Patient's Chart, lab work & pertinent test results  Airway Mallampati: III  TM Distance: <3 FB Neck ROM: full    Dental  (+) Chipped   Pulmonary neg shortness of breath, asthma , sleep apnea    Pulmonary exam normal        Cardiovascular Exercise Tolerance: Good hypertension, (-) angina + CAD, + Past MI and +CHF  Normal cardiovascular exam+ Cardiac Defibrillator      Neuro/Psych Seizures -,   Neuromuscular disease  negative psych ROS   GI/Hepatic Neg liver ROS,GERD  Controlled,,  Endo/Other  negative endocrine ROSdiabetes    Renal/GU Renal disease  negative genitourinary   Musculoskeletal   Abdominal   Peds  Hematology negative hematology ROS (+)   Anesthesia Other Findings Past Medical History: 2011: AICD (automatic cardioverter/defibrillator) present No date: Asthma No date: Cancer [facility])     Comment:  prostate No date: CHF (congestive heart failure) (HCC) No date: Coronary artery disease No date: Diabetes mellitus without complication (HCC) No date: Dyspnea No date: ED (erectile dysfunction) No date: GERD (gastroesophageal reflux disease) No date: Hyperlipidemia No date: Hypertension No date: Ischemic cardiomyopathy No date: LADA (latent autoimmune diabetes in adults), managed as type  1 (HCC) 2011: Myocardial infarction (HCC)     Comment:  anterior MI at [facility] s/p BMS to ostial/proximal LAD No date: Neuromuscular disorder (HCC)     Comment:  problems in arms. finger tips have been numb/tingling.               voltaren works 2018: Obstructive sleep apnea     Comment:  cpap. has not used lately. uses a breathe-right strip on              nose No date: Seizures (HCC)     Comment:  diabetes seizures (low blood sugar); last one 08/2018 No date: Systolic heart failure  (HCC) No date: Vitamin D  deficiency  Past Surgical History: 08/30/2010: CARDIAC DEFIBRILLATOR PLACEMENT 11/07/2017: CHOLECYSTECTOMY; N/A     Comment:  Procedure: LAPAROSCOPIC CHOLECYSTECTOMY;  Surgeon:               Rodolph Romano, MD;  Location: [facility] ORS;  Service:              General;  Laterality: N/A; 03/04/2023: COLONOSCOPY WITH PROPOFOL ; N/A     Comment:  Procedure: COLONOSCOPY WITH PROPOFOL ;  Surgeon: Therisa Bi, MD;  Location: [facility] ENDOSCOPY;  Service:               Gastroenterology;  Laterality: N/A; 2011: CORONARY ANGIOPLASTY     Comment:  stent placed x 1 01/03/2020: CYSTOSCOPY; N/A     Comment:  Procedure: CYSTOSCOPY FLEXIBLE;  Surgeon: Penne Knee, MD;  Location: [facility] ORS;  Service: Urology;                Laterality: N/A; 03/20/2018: ESOPHAGOGASTRODUODENOSCOPY (EGD) WITH PROPOFOL ; N/A     Comment:  Procedure: ESOPHAGOGASTRODUODENOSCOPY (EGD) WITH               PROPOFOL ;  Surgeon: Viktoria Lamar DASEN, MD;  Location:               [facility] ENDOSCOPY;  Service: Endoscopy;  Laterality: N/A;  11/23/2016: LEFT HEART CATH AND CORONARY ANGIOGRAPHY; N/A     Comment:  Procedure: Left Heart Cath and Coronary Angiography;                Surgeon: Lonni Hanson, MD;  Location: [facility] INVASIVE CV              LAB;  Service: Cardiovascular;  Laterality: N/A; 2019: PROSTATE BIOPSY 10/23/2019: PROSTATE BIOPSY; N/A     Comment:  Procedure: PROSTATE BIOPSY;  Surgeon: Twylla Glendia BROCKS,               MD;  Location: [facility] ORS;  Service: Urology;  Laterality:               N/A; 01/03/2020: ROBOT ASSISTED LAPAROSCOPIC RADICAL PROSTATECTOMY; N/A     Comment:  Procedure: XI ROBOTIC ASSISTED LAPAROSCOPIC RADICAL               PROSTATECTOMY;  Surgeon: Penne Knee, MD;  Location:               [facility] ORS;  Service: Urology;  Laterality: N/A; 10/23/2019: TRANSRECTAL ULTRASOUND; N/A     Comment:  Procedure: TRANSRECTAL ULTRASOUND;  Surgeon: Twylla Glendia BROCKS, MD;  Location: [facility] ORS;  Service: Urology;                Laterality: N/A; 2008: WRIST SURGERY; Right  BMI    Body Mass Index: 29.03 kg/m      Reproductive/Obstetrics negative OB ROS                              Anesthesia Physical Anesthesia Plan  ASA: 3  Anesthesia Plan: General   Post-op Pain Management:    Induction: Intravenous  PONV Risk Score and Plan: Propofol  infusion and TIVA  Airway Management Planned: Natural Airway and Nasal Cannula  Additional Equipment:   Intra-op Plan:   Post-operative Plan:   Informed Consent: I have reviewed the patients History and Physical, chart, labs and discussed the procedure including the risks, benefits and alternatives for the proposed anesthesia with the patient or authorized representative who has indicated his/her understanding and acceptance.     Dental Advisory Given  Plan Discussed with: Anesthesiologist, CRNA and Surgeon  Anesthesia Plan Comments: (Patient consented for risks of anesthesia including but not limited to:  - adverse reactions to medications - risk of airway placement if required - damage to eyes, teeth, lips or other oral mucosa - nerve damage due to positioning  - sore throat or hoarseness - Damage to heart, brain, nerves, lungs, other parts of body or loss of life  Patient voiced understanding and assent.)        Anesthesia Quick Evaluation

## 2024-08-30 NOTE — Anesthesia Postprocedure Evaluation (Signed)
 Anesthesia Post Note  Patient: Joseph [name]  Procedure(s) Performed: EGD (ESOPHAGOGASTRODUODENOSCOPY)  Patient location during evaluation: Endoscopy Anesthesia Type: General Level of consciousness: awake and alert Pain management: pain level controlled Vital Signs Assessment: post-procedure vital signs reviewed and stable Respiratory status: spontaneous breathing, nonlabored ventilation and respiratory function stable Cardiovascular status: blood pressure returned to baseline and stable Postop Assessment: no apparent nausea or vomiting Anesthetic complications: no   No notable events documented.   Last Vitals:  Vitals:   08/30/24 1003 08/30/24 1006  BP: (!) 104/51 (!) 104/51  Pulse: 71 77  Resp: 19 (!) 21  Temp:    SpO2: 97% 95%    Last Pain:  Vitals:   08/30/24 0852  TempSrc: Temporal  PainSc: 0-No pain                 [name]

## 2024-08-30 NOTE — H&P (Signed)
 Joseph JONELLE Brooklyn, MD [facility] Gastroenterology, DHIP [address]  [address], [place] [address]  Main: [phone] Fax:  [phone] Pager: [phone]   Primary Care Physician:  Adina Buel HERO, MD Primary Gastroenterologist:  Dr. Corinn JONELLE [name]  Pre-Procedure History & Physical: HPI:  Joseph [name] is a 53 y.o. male is here for an endoscopy.   Past Medical History:  Diagnosis Date   AICD (automatic cardioverter/defibrillator) present 2011   Asthma    Cancer (HCC)    prostate   CHF (congestive heart failure) (HCC)    Coronary artery disease    Diabetes mellitus without complication (HCC)    Dyspnea    ED (erectile dysfunction)    GERD (gastroesophageal reflux disease)    Hyperlipidemia    Hypertension    Ischemic cardiomyopathy    LADA (latent autoimmune diabetes in adults), managed as type 1 (HCC)    Myocardial infarction (HCC) 2011   anterior MI at [facility] s/p BMS to ostial/proximal LAD   Neuromuscular disorder (HCC)    problems in arms. finger tips have been numb/tingling. voltaren works   Obstructive sleep apnea 2018   cpap. has not used lately. uses a breathe-right strip on nose   Seizures (HCC)    diabetes seizures (low blood sugar); last one 08/2018   Systolic heart failure (HCC)    Vitamin D  deficiency     Past Surgical History:  Procedure Laterality Date   CARDIAC DEFIBRILLATOR PLACEMENT  08/30/2010   CHOLECYSTECTOMY N/A 11/07/2017   Procedure: LAPAROSCOPIC CHOLECYSTECTOMY;  Surgeon: Rodolph Romano, MD;  Location: [facility] ORS;  Service: General;  Laterality: N/A;   COLONOSCOPY WITH PROPOFOL  N/A 03/04/2023   Procedure: COLONOSCOPY WITH PROPOFOL ;  Surgeon: Therisa Bi, MD;  Location: [facility] ENDOSCOPY;  Service: Gastroenterology;  Laterality: N/A;   CORONARY ANGIOPLASTY  2011   stent placed x 1   CYSTOSCOPY N/A 01/03/2020   Procedure: CYSTOSCOPY FLEXIBLE;  Surgeon: Penne Knee, MD;  Location: [facility] ORS;  Service: Urology;  Laterality: N/A;    ESOPHAGOGASTRODUODENOSCOPY (EGD) WITH PROPOFOL  N/A 03/20/2018   Procedure: ESOPHAGOGASTRODUODENOSCOPY (EGD) WITH PROPOFOL ;  Surgeon: Viktoria Lamar DASEN, MD;  Location: [facility] ENDOSCOPY;  Service: Endoscopy;  Laterality: N/A;   LEFT HEART CATH AND CORONARY ANGIOGRAPHY N/A 11/23/2016   Procedure: Left Heart Cath and Coronary Angiography;  Surgeon: Lonni Hanson, MD;  Location: [facility] INVASIVE CV LAB;  Service: Cardiovascular;  Laterality: N/A;   PROSTATE BIOPSY  2019   PROSTATE BIOPSY N/A 10/23/2019   Procedure: PROSTATE BIOPSY;  Surgeon: Twylla Glendia BROCKS, MD;  Location: [facility] ORS;  Service: Urology;  Laterality: N/A;   ROBOT ASSISTED LAPAROSCOPIC RADICAL PROSTATECTOMY N/A 01/03/2020   Procedure: XI ROBOTIC ASSISTED LAPAROSCOPIC RADICAL PROSTATECTOMY;  Surgeon: Penne Knee, MD;  Location: [facility] ORS;  Service: Urology;  Laterality: N/A;   TRANSRECTAL ULTRASOUND N/A 10/23/2019   Procedure: TRANSRECTAL ULTRASOUND;  Surgeon: Twylla Glendia BROCKS, MD;  Location: [facility] ORS;  Service: Urology;  Laterality: N/A;   WRIST SURGERY Right 2008    Prior to Admission medications   Medication Sig Start Date End Date Taking? Authorizing Provider  acetaminophen  (TYLENOL ) 500 MG tablet Take 500 mg by mouth every 6 (six) hours as needed for fever.    [provider]  albuterol  (PROVENTIL  HFA) 108 (90 Base) MCG/ACT inhaler Inhale 2 puffs into the lungs every 4 (four) hours as needed for wheezing or shortness of breath. 10/08/18   Viviann Pastor, MD  aspirin  EC 81 MG tablet Take 81 mg by mouth every  morning.    [provider]  atorvastatin  (LIPITOR) 80 MG tablet Take 1 tablet (80 mg total) by mouth daily. PLEASE SCHEDULE APPOINTMENT FOR MORE REFILLS [phone] OPTION 2 08/20/24   Rolan Ezra RAMAN, MD  bisoprolol  (ZEBETA ) 10 MG tablet TAKE 1 TABLET BY MOUTH EVERY DAY 06/27/24   McLean, Dalton S, MD  budesonide -formoterol  (SYMBICORT ) 160-4.5 MCG/ACT inhaler Inhale 2 puffs into the lungs 2 (two) times daily.  08/22/24   Kasa, Kurian, MD  budesonide -glycopyrrolate -formoterol  (BREZTRI  AEROSPHERE) 160-9-4.8 MCG/ACT AERO inhaler Inhale 2 puffs into the lungs in the morning and at bedtime. 03/16/24   Kasa, Kurian, MD  budesonide -glycopyrrolate -formoterol  (BREZTRI  AEROSPHERE) 160-9-4.8 MCG/ACT AERO inhaler Inhale 2 puffs into the lungs in the morning and at bedtime. 03/29/24   Kasa, Kurian, MD  cetirizine-pseudoephedrine (ZYRTEC-D) 5-120 MG tablet Take by mouth. 08/15/18   [provider]  Cholecalciferol  25 MCG (1000 UT) capsule Take 1,000 Units by mouth daily.    [provider]  digoxin  (LANOXIN ) 0.125 MG tablet Take 1 tablet (125 mcg total) by mouth daily. PLEASE SCHEDULE APPOINTMENT FOR MORE REFILLS [phone] OPTION 2 08/20/24   Rolan Ezra RAMAN, MD  ezetimibe  (ZETIA ) 10 MG tablet Take 1 tablet (10 mg total) by mouth daily. 02/01/24 05/01/24  Rolan Ezra RAMAN, MD  fluticasone  (FLONASE ) 50 MCG/ACT nasal spray Place 2 sprays into both nostrils daily as needed for allergies.  01/26/16   [provider]  glucagon, human recombinant, (GLUCAGEN) 1 MG injection Inject into the muscle as needed. 02/07/18   [provider]  insulin  aspart (NOVOLOG ) 100 UNIT/ML FlexPen Inject 18-20-22 u TID AC plus sliding scale for up to 75 units TDD 01/10/20   [provider]  insulin  degludec (TRESIBA  FLEXTOUCH) 200 UNIT/ML FlexTouch Pen Inject 60 Units into the skin daily. 01/29/20   [provider]  Insulin  Pen Needle (B-D ULTRAFINE III SHORT PEN) 31G X 8 MM MISC USE 5 TIMES DAILY AS  DIRECTED 12/01/19   Viviann Pastor, MD  loratadine  (CLARITIN ) 10 MG tablet Take 10 mg by mouth daily as needed.     [provider]  Multiple Vitamin (MULTI-VITAMIN) tablet Take 1 tablet by mouth daily.    [provider]  naproxen sodium (ALEVE) 220 MG tablet Take 220 mg by mouth daily as needed.    [provider]  pantoprazole  (PROTONIX ) 40 MG tablet TAKE 1 TABLET BY  MOUTH EVERY DAY 03/20/24   Kasa, Kurian, MD  sacubitril -valsartan  (ENTRESTO ) 97-103 MG TAKE 1 TABLET BY MOUTH TWICE A DAY. STOP LOSARTAN  03/02/24   Rolan Ezra RAMAN, MD  spironolactone  (ALDACTONE ) 25 MG tablet TAKE 1 TABLET (25 MG TOTAL) BY MOUTH DAILY. 01/06/24   Rolan Ezra RAMAN, MD  torsemide  (DEMADEX ) 20 MG tablet Take 3 tablets (60 mg total) by mouth daily. 07/12/23   Rolan Ezra RAMAN, MD  traMADol  (ULTRAM ) 50 MG tablet Take 50 mg by mouth every 4 (four) hours as needed. 01/06/22   [provider]    Allergies as of 08/20/2024 - Review Complete 03/29/2024  Allergen Reaction Noted   Vancomycin Shortness Of Breath 04/18/2016   Lisinopril Rash 04/18/2016    Family History  Problem Relation Age of Onset   Hypertension Mother    Heart attack Paternal Grandmother    Heart attack Father    Bladder Cancer Neg Hx    Kidney cancer Neg Hx    Prostate cancer Neg Hx     Social History   Socioeconomic History   Marital status: Divorced  Spouse name: Not on file   Number of children: Not on file   Years of education: Not on file   Highest education level: Not on file  Occupational History   Occupation: location manager  Tobacco Use   Smoking status: Never   Smokeless tobacco: Never  Vaping Use   Vaping status: Never Used  Substance and Sexual Activity   Alcohol use: No   Drug use: No   Sexual activity: Yes  Other Topics Concern   Not on file  Social History Narrative   Not on file   Social Drivers of Health   Financial Resource Strain: Low Risk  (08/20/2024)   Received from [facility]   Overall Financial Resource Strain (CARDIA)    Difficulty of Paying Living Expenses: Not hard at all  Food Insecurity: No Food Insecurity (08/20/2024)   Received from [facility]   Hunger Vital Sign    Within the past 12 months, you worried that your food would run out before you got the money to buy more.: Never true    Within the past 12  months, the food you bought just didn't last and you didn't have money to get more.: Never true  Transportation Needs: No Transportation Needs (08/20/2024)   Received from [facility] - Transportation    In the past 12 months, has lack of transportation kept you from medical appointments or from getting medications?: No    Lack of Transportation (Non-Medical): No  Physical Activity: Not on file  Stress: Not on file  Social Connections: Not on file  Intimate Partner Violence: Not on file    Review of Systems: See HPI, otherwise negative ROS  Physical Exam: BP 125/72   Pulse 71   Temp (!) 96.8 F (36 C) (Temporal)   Resp 20   Ht 6' 1 (1.854 m)   Wt 99.8 kg   SpO2 98%   BMI 29.03 kg/m  General:   Alert,  pleasant and cooperative in NAD Head:  Normocephalic and atraumatic. Neck:  Supple; no masses or thyromegaly. Lungs:  Clear throughout to auscultation.    Heart:  Regular rate and rhythm. Abdomen:  Soft, nontender and nondistended. Normal bowel sounds, without guarding, and without rebound.   Neurologic:  Alert and  oriented x4;  grossly normal neurologically.  Impression/Plan: Joseph [name] is here for an endoscopy to be performed for Chronic GERD, epigastric discomfort, early satiety   Risks, benefits, limitations, and alternatives regarding  endoscopy have been reviewed with the patient.  Questions have been answered.  All parties agreeable.   Joseph Brooklyn, MD  08/30/2024, 9:24 AM

## 2024-08-31 LAB — SURGICAL PATHOLOGY

## 2024-09-10 ENCOUNTER — Other Ambulatory Visit: Payer: Self-pay | Admitting: Gastroenterology

## 2024-09-10 ENCOUNTER — Ambulatory Visit: Payer: Self-pay | Admitting: Gastroenterology

## 2024-09-10 DIAGNOSIS — R1013 Epigastric pain: Secondary | ICD-10-CM

## 2024-09-10 DIAGNOSIS — R6881 Early satiety: Secondary | ICD-10-CM

## 2024-09-11 ENCOUNTER — Encounter
Admission: RE | Disposition: A | Discharge status: Home / Self Care | Payer: Self-pay | Source: Home / Self Care | Attending: Cardiology

## 2024-09-11 ENCOUNTER — Ambulatory Visit
Admission: RE | Admit: 2024-09-11 | Discharge: 2024-09-11 | Disposition: A | Discharge status: Home / Self Care | Attending: Cardiology | Admitting: Cardiology

## 2024-09-11 ENCOUNTER — Encounter: Payer: Self-pay | Admitting: Cardiology

## 2024-09-11 ENCOUNTER — Other Ambulatory Visit: Payer: Self-pay

## 2024-09-11 DIAGNOSIS — G4733 Obstructive sleep apnea (adult) (pediatric): Secondary | ICD-10-CM | POA: Diagnosis not present

## 2024-09-11 DIAGNOSIS — Z79899 Other long term (current) drug therapy: Secondary | ICD-10-CM | POA: Insufficient documentation

## 2024-09-11 DIAGNOSIS — E139 Other specified diabetes mellitus without complications: Secondary | ICD-10-CM | POA: Insufficient documentation

## 2024-09-11 DIAGNOSIS — I5022 Chronic systolic (congestive) heart failure: Secondary | ICD-10-CM | POA: Diagnosis not present

## 2024-09-11 DIAGNOSIS — I255 Ischemic cardiomyopathy: Secondary | ICD-10-CM | POA: Diagnosis not present

## 2024-09-11 DIAGNOSIS — E785 Hyperlipidemia, unspecified: Secondary | ICD-10-CM | POA: Diagnosis not present

## 2024-09-11 DIAGNOSIS — Z7982 Long term (current) use of aspirin: Secondary | ICD-10-CM | POA: Insufficient documentation

## 2024-09-11 DIAGNOSIS — Z4502 Encounter for adjustment and management of automatic implantable cardiac defibrillator: Secondary | ICD-10-CM | POA: Diagnosis present

## 2024-09-11 DIAGNOSIS — I2510 Atherosclerotic heart disease of native coronary artery without angina pectoris: Secondary | ICD-10-CM | POA: Insufficient documentation

## 2024-09-11 DIAGNOSIS — I110 Hypertensive heart disease with heart failure: Secondary | ICD-10-CM | POA: Insufficient documentation

## 2024-09-11 DIAGNOSIS — I252 Old myocardial infarction: Secondary | ICD-10-CM | POA: Diagnosis not present

## 2024-09-11 DIAGNOSIS — Z794 Long term (current) use of insulin: Secondary | ICD-10-CM | POA: Diagnosis not present

## 2024-09-11 DIAGNOSIS — Z45010 Encounter for checking and testing of cardiac pacemaker pulse generator [battery]: Secondary | ICD-10-CM

## 2024-09-11 HISTORY — PX: ICD GENERATOR CHANGEOUT: EP1231

## 2024-09-11 LAB — GLUCOSE, CAPILLARY
Glucose-Capillary: 78 mg/dL (ref 70–99)
Glucose-Capillary: 63 mg/dL — ABNORMAL LOW (ref 70–99)
Glucose-Capillary: 70 mg/dL (ref 70–99)
Glucose-Capillary: 131 mg/dL — ABNORMAL HIGH (ref 70–99)

## 2024-09-11 SURGERY — ICD GENERATOR CHANGEOUT
Anesthesia: Moderate Sedation

## 2024-09-11 MED ORDER — FENTANYL CITRATE (PF) 100 MCG/2ML IJ SOLN
INTRAMUSCULAR | Status: DC | PRN
  Administered 2024-09-11: 25 ug via INTRAVENOUS

## 2024-09-11 MED ORDER — CEFAZOLIN SODIUM-DEXTROSE 2-4 GM/100ML-% IV SOLN
2.0000 g | INTRAVENOUS | Status: AC
  Administered 2024-09-11: 2 g via INTRAVENOUS

## 2024-09-11 MED ORDER — LIDOCAINE HCL (PF) 1 % IJ SOLN
INTRAMUSCULAR | Status: DC | PRN
  Administered 2024-09-11: 50 mL

## 2024-09-11 MED ORDER — CEFAZOLIN SODIUM-DEXTROSE 2-4 GM/100ML-% IV SOLN
INTRAVENOUS | Status: AC
  Filled 2024-09-11: qty 100

## 2024-09-11 MED ORDER — POVIDONE-IODINE 10 % EX SWAB
2.0000 | Freq: Once | CUTANEOUS | Status: AC
  Administered 2024-09-11: 2 via TOPICAL

## 2024-09-11 MED ORDER — HEPARIN (PORCINE) IN NACL 1000-0.9 UT/500ML-% IV SOLN
INTRAVENOUS | Status: AC
  Filled 2024-09-11: qty 500

## 2024-09-11 MED ORDER — LIDOCAINE HCL 1 % IJ SOLN
INTRAMUSCULAR | Status: AC
  Filled 2024-09-11: qty 60

## 2024-09-11 MED ORDER — MIDAZOLAM HCL 2 MG/2ML IJ SOLN
INTRAMUSCULAR | Status: AC
  Filled 2024-09-11: qty 2

## 2024-09-11 MED ORDER — CEPHALEXIN 500 MG PO CAPS: 500.0000 mg | ORAL_CAPSULE | Freq: Two times a day (BID) | ORAL | 0 refills | Status: AC

## 2024-09-11 MED ORDER — MIDAZOLAM HCL (PF) 2 MG/2ML IJ SOLN
INTRAMUSCULAR | Status: DC | PRN
  Administered 2024-09-11: 1 mg via INTRAVENOUS

## 2024-09-11 MED ORDER — SODIUM CHLORIDE 0.9 % IV SOLN: INTRAVENOUS | Status: DC

## 2024-09-11 MED ORDER — SODIUM CHLORIDE 0.9 % IV SOLN
80.0000 mg | INTRAVENOUS | Status: AC
  Administered 2024-09-11: 80 mg
  Filled 2024-09-11: qty 2

## 2024-09-11 MED ORDER — ONDANSETRON HCL 4 MG/2ML IJ SOLN: 4.0000 mg | Freq: Four times a day (QID) | INTRAMUSCULAR | Status: DC | PRN

## 2024-09-11 MED ORDER — FENTANYL CITRATE (PF) 100 MCG/2ML IJ SOLN
INTRAMUSCULAR | Status: AC
  Filled 2024-09-11: qty 2

## 2024-09-11 MED ORDER — ACETAMINOPHEN 325 MG PO TABS: 325.0000 mg | ORAL_TABLET | ORAL | Status: DC | PRN

## 2024-09-11 SURGICAL SUPPLY — 14 items
CABLE SURG 12 DISP A/V CHANNEL (MISCELLANEOUS) IMPLANT
DEVICE DSSCT PLSMBLD 3.0S LGHT (MISCELLANEOUS) IMPLANT
DRAPE BRACHIAL (DRAPES) IMPLANT
DRAPE INCISE 23X17 STRL (DRAPES) IMPLANT
ICD MOMENTUM D120 (ICD Generator) IMPLANT
KIT SYRINGE INJ CVI SPIKEX1 (MISCELLANEOUS) IMPLANT
PACK CARDIAC CATH (CUSTOM PROCEDURE TRAY) IMPLANT
PAD ELECT DEFIB RADIOL ZOLL (MISCELLANEOUS) IMPLANT
POUCH AIGIS-R ANTIBACT ICD LRG (Mesh General) IMPLANT
SET ATX-X65L (MISCELLANEOUS) IMPLANT
STATION PROTECTION PRESSURIZED (MISCELLANEOUS) IMPLANT
SUT VIC AB 2-0 CT2 27 (SUTURE) IMPLANT
SUT VIC AB 4-0 PS2 18 (SUTURE) IMPLANT
TRAY PACEMAKER INSERTION (PACKS) ×1 IMPLANT

## 2024-09-11 NOTE — Discharge Instructions (Addendum)
 Patient may shower 09/13/2024.  Leave outer dressing and Steri-Strips on. Remove tegaderm dressing in 5-7 days and leave the steristrips until they start to come off on their own.      Cardioverter Defibrillator Implantation, Care After The following information offers guidance on how to care for yourself after your procedure. Your health care provider may also give you more specific instructions. If you have problems or questions, contact your health care provider. What can I expect after the procedure? After the procedure, it is common to have: Some pain. Pain may last a few days. A slight bump over the skin where the device was placed. Sometimes, it is possible to feel the device under the skin. This is normal. During the months and years after your procedure, your health care provider will check the device, the wires (leads), and the battery every few months. The generator is monitored over time and will be replaced when the battery is depleted. Follow these instructions at home: Medicines Take over-the-counter and prescription medicines only as told by your health care provider. If you were prescribed an antibiotic medicine, take it as told by your health care provider. Do not stop taking the antibiotic even if you start to feel better. Bathing Do not take baths, swim, or use a hot tub for 7 days You may shower on 12/4; you may remove your dressing on 12/9, then but leave steri strips until your follow up visit on 12/11. Incision care  Follow instructions from your health care provider about how to take care of your incision. Make sure you: Wash your hands with soap and water  for at least 20 seconds before and after you change your bandage (dressing). If soap and water  are not available, use hand sanitizer. Remove your dressing in 2 days but leave steri strips intact   These skin closures may need to stay in place for 2 weeks or longer. If adhesive strip edges start to loosen and curl up,  you may trim the loose edges. Do not remove adhesive strips completely unless your health care provider tells you to do that. Check your incision area every day for signs of infection. Check for: Redness, swelling, or more pain. Fluid or blood. Warmth. Pus or a bad smell. Do not use lotions or ointments near the incision area unless told by your health care provider. Do not wear tight clothes or clothes that could rub on your incision. Activity  Do not lift anything that is heavier than 10 lb (4.5 kg), or the limit that you are told, until your health care provider says that it is safe. Return to your normal activities Avoid sitting for a long time without moving.  Get up to take short walks every 1-2 hours. This is important to improve blood flow and breathing. Ask for help if you feel weak or unsteady. Do not drive or operate machinery for 24 hours Participate in a cardiac rehabilitation program as told by your health care provider. A cardiac rehabilitation program is a treatment program to improve your health and well-being through exercise training, education, and counseling. Electric and magnetic fields Tell all health care providers, including your dentist, that you have a defibrillator. Some medical devices and imaging methods such as MRI may affect your device. If you must pass through a metal detector, quickly walk through it. Do not stop under the detector. Do not stand near it. Avoid places or objects that have a strong electric or magnetic field, including: Scientist, physiological.  At the airport, let officials know that you have a defibrillator. Your defibrillator ID card will let you be checked in a way that is safe for you and will not damage your defibrillator. Do not let a security person wave a magnetic wand near your defibrillator. That can make it stop working. Power plants. Large electrical generators. Anti-theft systems or electronic article surveillance  (EAS). Radiofrequency transmission towers, such as mobile phone and radio towers. Some household devices and appliances may produce electromagnetic waves that affect your defibrillator. If you are not sure if something is safe to use, ask your health care provider. When using your mobile phone, hold it to the ear that is on the opposite side from the defibrillator. Do not leave your mobile phone in a pocket over the defibrillator. Many devices contain magnets. Examples include headphones and electronic cigarettes. Do not put them in a pocket near your device. If they are wearable, wear them as far away from the device as possible. Some devices are safe to use if they are held at least 12 inches (30 cm) away from your defibrillator. These include power tools, chain saws, lawn mowers, and speakers. You may safely use electric blankets, heating pads, computers, and microwave ovens. Do not use amateur or ham radio equipment, electric (arc) welding torches, magnetic mattresses or chairs, and body fat measuring scales. General instructions Always keep your defibrillator ID card with you. The card should list the implant date, device model, and manufacturer. Consider wearing a medical alert bracelet or necklace. Have your defibrillator checked as often as told by your health care provider. Most defibrillators last for 5-7 years. Do not use any products that contain nicotine or tobacco. These products include cigarettes, chewing tobacco, and vaping devices, such as e-cigarettes. If you need help quitting, ask your health care provider. Discuss specific device restrictions with your device manufacturer or health care provider. Get screened for depression and anxiety, and seek treatment if needed. Keep all follow-up visits. This is important. Contact a health care provider if: You feel one shock in your chest. You gain weight suddenly. You have a fever. You have swelling in your arm on the same side of your  body as the device or have swelling in your legs or feet. It feels like your heart is fluttering or skipping beats (heart palpitations). You have any of these signs of infection around your incision: Redness, swelling, or more pain. Fluid or blood. Warmth. Pus or a bad smell. You are feeling depressed, scared, anxious, or sad. Get help right away if: You have chest pain. You feel more than one shock. You have problems breathing or have shortness of breath. You have dizziness or fainting. Your incision starts to open up. These symptoms may represent a serious problem that is an emergency. Do not wait to see if the symptoms will go away. Get medical help right away. Call your local emergency services (911 in the [place] Do not drive yourself to the hospital. Summary Always keep your defibrillator ID card with you. The card should list the implant date, device model, and manufacturer. Consider wearing a medical alert bracelet or necklace. Follow the device distance restrictions for electric and magnetic fields. Keep all follow-up visits with your health care provider for monitoring of your defibrillator. Contact your health care provider if you have signs of an infection such as a fever or redness, swelling, or more pain around your incision. Get help right away if you have chest pain, receive more  than one shock, or faint. This information is not intended to replace advice given to you by your health care provider. Make sure you discuss any questions you have with your health care provider. Document Revised: 03/26/2020 Document Reviewed: 03/26/2020 Elsevier Patient Education  2023 [employer].

## 2024-09-11 NOTE — H&P (Signed)
 [facility] CLINIC CARDIOLOGY HISTORY & PHYSICAL        Patient ID: Joseph [name] MRN: [id number] DOB/AGE: 1971/08/07 53 y.o. Admit date: 09/11/2024  Primary Care Physician: Adina Buel HERO, MD Primary Cardiologist Therisa Pierre, PA  HPI: Joseph [name] is a 53 y.o. male  with a past medical history of coronary artery disease, ischemic cardiomyopathy, chronic HFrEF, s/p single-chamber ICD 08/2010, hypertension, hyperlipidemia, OSA on CPAP, diabetes who presents today for elective ICD generator replacement.  He reports he is doing well without any concerning symptoms recently. Denies any CP, SOB, palpitations, dizziness/lightheadedness. Tolerating home medications well recently. He is amenable to proceeding with ICD generator replacement as his battery is at end of life.    Past Medical History:  Diagnosis Date   AICD (automatic cardioverter/defibrillator) present 2011   Asthma    Cancer (HCC)    prostate   CHF (congestive heart failure) (HCC)    Coronary artery disease    Diabetes mellitus without complication (HCC)    Dyspnea    ED (erectile dysfunction)    GERD (gastroesophageal reflux disease)    Hyperlipidemia    Hypertension    Ischemic cardiomyopathy    LADA (latent autoimmune diabetes in adults), managed as type 1 (HCC)    Myocardial infarction (HCC) 2011   anterior MI at [facility] s/p BMS to ostial/proximal LAD   Neuromuscular disorder (HCC)    problems in arms. finger tips have been numb/tingling. voltaren works   Obstructive sleep apnea 2018   cpap. has not used lately. uses a breathe-right strip on nose   Seizures (HCC)    diabetes seizures (low blood sugar); last one 08/2018   Systolic heart failure (HCC)    Vitamin D  deficiency     Past Surgical History:  Procedure Laterality Date   CARDIAC DEFIBRILLATOR PLACEMENT  08/30/2010   CHOLECYSTECTOMY N/A 11/07/2017   Procedure: LAPAROSCOPIC CHOLECYSTECTOMY;  Surgeon: Rodolph Romano, MD;  Location: [facility] ORS;  Service:  General;  Laterality: N/A;   COLONOSCOPY WITH PROPOFOL  N/A 03/04/2023   Procedure: COLONOSCOPY WITH PROPOFOL ;  Surgeon: Therisa Bi, MD;  Location: [facility] ENDOSCOPY;  Service: Gastroenterology;  Laterality: N/A;   CORONARY ANGIOPLASTY  2011   stent placed x 1   CYSTOSCOPY N/A 01/03/2020   Procedure: CYSTOSCOPY FLEXIBLE;  Surgeon: Penne Knee, MD;  Location: [facility] ORS;  Service: Urology;  Laterality: N/A;   ESOPHAGOGASTRODUODENOSCOPY N/A 08/30/2024   Procedure: EGD (ESOPHAGOGASTRODUODENOSCOPY);  Surgeon: Unk Corinn Skiff, MD;  Location: [facility] ENDOSCOPY;  Service: Gastroenterology;  Laterality: N/A;   ESOPHAGOGASTRODUODENOSCOPY (EGD) WITH PROPOFOL  N/A 03/20/2018   Procedure: ESOPHAGOGASTRODUODENOSCOPY (EGD) WITH PROPOFOL ;  Surgeon: Viktoria Lamar DASEN, MD;  Location: [facility] ENDOSCOPY;  Service: Endoscopy;  Laterality: N/A;   LEFT HEART CATH AND CORONARY ANGIOGRAPHY N/A 11/23/2016   Procedure: Left Heart Cath and Coronary Angiography;  Surgeon: Lonni Hanson, MD;  Location: [facility] INVASIVE CV LAB;  Service: Cardiovascular;  Laterality: N/A;   PROSTATE BIOPSY  2019   PROSTATE BIOPSY N/A 10/23/2019   Procedure: PROSTATE BIOPSY;  Surgeon: Twylla Glendia BROCKS, MD;  Location: [facility] ORS;  Service: Urology;  Laterality: N/A;   ROBOT ASSISTED LAPAROSCOPIC RADICAL PROSTATECTOMY N/A 01/03/2020   Procedure: XI ROBOTIC ASSISTED LAPAROSCOPIC RADICAL PROSTATECTOMY;  Surgeon: Penne Knee, MD;  Location: [facility] ORS;  Service: Urology;  Laterality: N/A;   TRANSRECTAL ULTRASOUND N/A 10/23/2019   Procedure: TRANSRECTAL ULTRASOUND;  Surgeon: Twylla Glendia BROCKS, MD;  Location: [facility] ORS;  Service: Urology;  Laterality: N/A;   WRIST SURGERY Right 2008  Medications Prior to Admission  Medication Sig Dispense Refill Last Dose/Taking   acetaminophen  (TYLENOL ) 500 MG tablet Take 500 mg by mouth every 6 (six) hours as needed for fever.   Taking As Needed   albuterol  (PROVENTIL  HFA) 108 (90 Base) MCG/ACT inhaler Inhale 2 puffs into  the lungs every 4 (four) hours as needed for wheezing or shortness of breath. 1 Inhaler 0 Taking As Needed   aspirin  EC 81 MG tablet Take 81 mg by mouth every morning.   09/08/2024   atorvastatin  (LIPITOR) 80 MG tablet Take 1 tablet (80 mg total) by mouth daily. PLEASE SCHEDULE APPOINTMENT FOR MORE REFILLS [phone] OPTION 2 90 tablet 0 09/10/2024   bisoprolol  (ZEBETA ) 10 MG tablet TAKE 1 TABLET BY MOUTH EVERY DAY 90 tablet 3 09/11/2024 Morning   budesonide -formoterol  (SYMBICORT ) 160-4.5 MCG/ACT inhaler Inhale 2 puffs into the lungs 2 (two) times daily. 1 each 12 09/11/2024 Morning   cetirizine-pseudoephedrine (ZYRTEC-D) 5-120 MG tablet Take 1 tablet by mouth daily as needed for allergies.   Taking As Needed   Cholecalciferol  25 MCG (1000 UT) capsule Take 1,000 Units by mouth daily.   09/11/2024 Morning   digoxin  (LANOXIN ) 0.125 MG tablet Take 1 tablet (125 mcg total) by mouth daily. PLEASE SCHEDULE APPOINTMENT FOR MORE REFILLS [phone] OPTION 2 90 tablet 0 09/11/2024 Morning   fluticasone  (FLONASE ) 50 MCG/ACT nasal spray Place 2 sprays into both nostrils daily as needed for allergies.    Taking As Needed   glucagon, human recombinant, (GLUCAGEN) 1 MG injection Inject 0.25 mg into the muscle as needed for low blood sugar.   Taking As Needed   insulin  aspart (NOVOLOG ) 100 UNIT/ML FlexPen Inject 0-30 Units into the skin 3 (three) times daily with meals.   09/11/2024 Morning   insulin  degludec (TRESIBA  FLEXTOUCH) 200 UNIT/ML FlexTouch Pen Inject 60 Units into the skin daily.   09/11/2024 Morning   loratadine  (CLARITIN ) 10 MG tablet Take 10 mg by mouth daily as needed for allergies.   Taking As Needed   Multiple Vitamin (MULTI-VITAMIN) tablet Take 1 tablet by mouth daily.   09/11/2024 Morning   naproxen sodium (ALEVE) 220 MG tablet Take 220 mg by mouth daily as needed (Pain).   Taking As Needed   pantoprazole  (PROTONIX ) 40 MG tablet TAKE 1 TABLET BY MOUTH EVERY DAY (Patient taking differently: Take 40 mg  by mouth 2 (two) times daily.) 90 tablet 3 09/11/2024 Morning   sacubitril -valsartan  (ENTRESTO ) 97-103 MG TAKE 1 TABLET BY MOUTH TWICE A DAY. STOP LOSARTAN  180 tablet 3 09/11/2024 Morning   spironolactone  (ALDACTONE ) 25 MG tablet TAKE 1 TABLET (25 MG TOTAL) BY MOUTH DAILY. 90 tablet 3 09/10/2024   torsemide  (DEMADEX ) 20 MG tablet Take 3 tablets (60 mg total) by mouth daily. 90 tablet 5 09/10/2024   budesonide -glycopyrrolate -formoterol  (BREZTRI  AEROSPHERE) 160-9-4.8 MCG/ACT AERO inhaler Inhale 2 puffs into the lungs in the morning and at bedtime. (Patient not taking: Reported on 08/30/2024)   Not Taking   ezetimibe  (ZETIA ) 10 MG tablet Take 1 tablet (10 mg total) by mouth daily. (Patient not taking: Reported on 09/04/2024) 90 tablet 3 Not Taking   Insulin  Glargine (BASAGLAR  KWIKPEN) 100 UNIT/ML Inject into the skin. (Patient not taking: Reported on 09/04/2024)   Not Taking   Social History   Socioeconomic History   Marital status: Divorced    Spouse name: Not on file   Number of children: Not on file   Years of education: Not on file   Highest education level: Not  on file  Occupational History   Occupation: location manager  Tobacco Use   Smoking status: Never   Smokeless tobacco: Never  Vaping Use   Vaping status: Never Used  Substance and Sexual Activity   Alcohol use: No   Drug use: No   Sexual activity: Yes  Other Topics Concern   Not on file  Social History Narrative   Not on file   Social Drivers of Health   Financial Resource Strain: Low Risk  (08/20/2024)   Received from [facility]   Overall Financial Resource Strain (CARDIA)    Difficulty of Paying Living Expenses: Not hard at all  Food Insecurity: No Food Insecurity (08/20/2024)   Received from [facility]   Hunger Vital Sign    Within the past 12 months, you worried that your food would run out before you got the money to buy more.: Never true    Within the past 12 months, the food  you bought just didn't last and you didn't have money to get more.: Never true  Transportation Needs: No Transportation Needs (08/20/2024)   Received from [facility] - Transportation    In the past 12 months, has lack of transportation kept you from medical appointments or from getting medications?: No    Lack of Transportation (Non-Medical): No  Physical Activity: Not on file  Stress: Not on file  Social Connections: Not on file  Intimate Partner Violence: Not on file    Family History  Problem Relation Age of Onset   Hypertension Mother    Heart attack Paternal Grandmother    Heart attack Father    Bladder Cancer Neg Hx    Kidney cancer Neg Hx    Prostate cancer Neg Hx      Review of systems complete and found to be negative unless listed above   Physical Exam: General: Well developed, well nourished, in no acute distress HEENT:  Normocephalic and atramatic Neck:  No JVD.  Lungs: Clear bilaterally to auscultation and percussion. Heart: HRRR . Normal S1 and S2 without gallops or murmurs.  Abdomen: Bowel sounds are positive, abdomen soft and non-tender  Msk:  Back normal, normal gait. Normal strength and tone for age. Extremities: No clubbing, cyanosis or edema.   Neuro: Alert and oriented X 3. Psych:  Good affect, responds appropriately   Labs:   Lab Results  Component Value Date   WBC 8.1 02/09/2024   HGB 12.5 (L) 02/09/2024   HCT 36.1 (L) 02/09/2024   MCV 89.6 02/09/2024   PLT 209 02/09/2024   No results for input(s): NA, K, CL, CO2, BUN, CREATININE, CALCIUM , PROT, BILITOT, ALKPHOS, ALT, AST, GLUCOSE in the last 168 hours.  Invalid input(s): LABALBU Lab Results  Component Value Date   TROPONINI <0.03 10/08/2018    Lab Results  Component Value Date   CHOL 154 06/03/2023   CHOL 124 10/29/2022   CHOL 152 06/10/2022   Lab Results  Component Value Date   HDL 43 06/03/2023   HDL 45 10/29/2022   HDL 47  06/10/2022   Lab Results  Component Value Date   LDLCALC 89 06/03/2023   LDLCALC 65 10/29/2022   LDLCALC 89 06/10/2022   Lab Results  Component Value Date   TRIG 120 06/03/2023   TRIG 69 10/29/2022   TRIG 82 06/10/2022   Lab Results  Component Value Date   CHOLHDL 3.6 06/03/2023   CHOLHDL 2.8 10/29/2022   CHOLHDL  3.2 06/10/2022   No results found for: LDLDIRECT   Telemetry: NSR rate 70s  ASSESSMENT AND PLAN:  Joseph [name] is a 53 y.o. male  with a past medical history of coronary artery disease, ischemic cardiomyopathy, chronic HFrEF, s/p single-chamber ICD 08/2010, hypertension, hyperlipidemia, OSA on CPAP, diabetes who presents today for elective ICD generator replacement.  # ICD at end of battery life Patient is here today for ICD generator replacement due to device at [facility]. Procedure has been discussed in detail including risks/benefits and patient is amenable to proceeding. Written consent will be obtained.  This patient's plan of care was discussed and created with Dr. Ammon and he is in agreement.    SignedBETHA Danita Bloch, PA-C  09/11/2024, 7:13 AM

## 2024-09-11 NOTE — Progress Notes (Signed)
 CBG = 63 Apple juice and crackers with peanut butter given

## 2024-09-21 ENCOUNTER — Telehealth: Payer: Self-pay

## 2024-09-21 NOTE — Telephone Encounter (Signed)
 Copied from CRM #[id number]. Topic: Clinical - Medical Advice >> Sep 21, 2024 10:27 AM Russell PARAS wrote: Reason for CRM:   Nurse with Dr. Graig office is contacting clinic, due to provider reviewing pt's disability case. She is needing to know if Isaiah has any restrictions or limitation recommended for the pt.  Provider can contact Dr. Graig # and leave voicemail with information  CB#  [phone]

## 2024-10-02 NOTE — Telephone Encounter (Signed)
 Received a fax from [employer] with provider questions: 1) Have you placed the claimant on restrictions from the present day and beyond?  2) Which clinical functional deficits were you basing these restrictions upon?  Per the Physician Review within this document:  Given the stability of all chronic conditions, the absence of sustained functional limitations, and the resolution of acute conditions, I conclude that none of the claimant's conditions are impairing within the review timeframe.

## 2024-10-05 ENCOUNTER — Ambulatory Visit
Admission: RE | Admit: 2024-10-05 | Discharge: 2024-10-05 | Disposition: A | Discharge status: Home / Self Care | Attending: Gastroenterology | Admitting: Gastroenterology

## 2024-10-05 DIAGNOSIS — R1013 Epigastric pain: Secondary | ICD-10-CM | POA: Insufficient documentation

## 2024-10-05 DIAGNOSIS — R6881 Early satiety: Secondary | ICD-10-CM | POA: Diagnosis present

## 2024-10-05 MED ORDER — TECHNETIUM TC 99M SULFUR COLLOID
2.0000 | Freq: Once | INTRAVENOUS | Status: AC | PRN
  Administered 2024-10-05: 2.18 via ORAL

## 2024-10-07 ENCOUNTER — Ambulatory Visit: Payer: Self-pay | Admitting: Gastroenterology

## 2024-10-08 ENCOUNTER — Other Ambulatory Visit: Payer: Self-pay | Admitting: Internal Medicine

## 2024-10-08 DIAGNOSIS — K2990 Gastroduodenitis, unspecified, without bleeding: Secondary | ICD-10-CM

## 2024-10-08 DIAGNOSIS — K219 Gastro-esophageal reflux disease without esophagitis: Secondary | ICD-10-CM

## 2024-10-08 DIAGNOSIS — K2100 Gastro-esophageal reflux disease with esophagitis, without bleeding: Secondary | ICD-10-CM

## 2024-10-09 ENCOUNTER — Other Ambulatory Visit (HOSPITAL_COMMUNITY): Payer: Self-pay

## 2024-10-09 ENCOUNTER — Telehealth: Payer: Self-pay

## 2024-10-09 NOTE — Telephone Encounter (Signed)
*  [facility]  Pharmacy Patient Advocate Encounter   Received notification from RX Request Messages that prior authorization for Pantoprazole  40mg  tab is required/requested.   Insurance verification completed.   The patient is insured through [facility].   Pending drug specific fax form for plan, will not allow to be submitted electronically .

## 2024-10-10 NOTE — Telephone Encounter (Signed)
 PA is still pending.

## 2024-10-15 ENCOUNTER — Other Ambulatory Visit (HOSPITAL_COMMUNITY): Payer: Self-pay

## 2024-10-15 NOTE — Telephone Encounter (Signed)
*  patient now has new insurance.   Refill too soon due to last fill 10/12/2024, next fill 11/06/2024

## 2024-10-24 ENCOUNTER — Telehealth: Payer: Self-pay | Admitting: Cardiology

## 2024-10-24 NOTE — Telephone Encounter (Signed)
 Called to confirm/remind patient of [facility] on 10/[facility].   Appointment:   [] Confirmed  [x] Left mess   [] No answer/No voice mail  [] VM Full/unable to leave message  [] Phone not in service  Patient reminded to bring all medications and/or complete list.  Confirmed patient has transportation. Gave directions, instructed to utilize valet parking.

## 2024-10-25 ENCOUNTER — Encounter: Payer: Self-pay | Admitting: Cardiology

## 2024-10-25 ENCOUNTER — Ambulatory Visit: Attending: Cardiology | Admitting: Cardiology

## 2024-10-25 VITALS — BP 118/64 | HR 69 | Wt 218.0 lb

## 2024-10-25 DIAGNOSIS — Z95810 Presence of automatic (implantable) cardiac defibrillator: Secondary | ICD-10-CM | POA: Diagnosis not present

## 2024-10-25 DIAGNOSIS — Z79899 Other long term (current) drug therapy: Secondary | ICD-10-CM | POA: Diagnosis not present

## 2024-10-25 DIAGNOSIS — E785 Hyperlipidemia, unspecified: Secondary | ICD-10-CM | POA: Diagnosis not present

## 2024-10-25 DIAGNOSIS — I255 Ischemic cardiomyopathy: Secondary | ICD-10-CM

## 2024-10-25 DIAGNOSIS — Z7982 Long term (current) use of aspirin: Secondary | ICD-10-CM | POA: Insufficient documentation

## 2024-10-25 DIAGNOSIS — J45909 Unspecified asthma, uncomplicated: Secondary | ICD-10-CM | POA: Insufficient documentation

## 2024-10-25 DIAGNOSIS — Z8616 Personal history of COVID-19: Secondary | ICD-10-CM | POA: Diagnosis not present

## 2024-10-25 DIAGNOSIS — I252 Old myocardial infarction: Secondary | ICD-10-CM | POA: Diagnosis not present

## 2024-10-25 DIAGNOSIS — I110 Hypertensive heart disease with heart failure: Secondary | ICD-10-CM | POA: Diagnosis not present

## 2024-10-25 DIAGNOSIS — I5022 Chronic systolic (congestive) heart failure: Secondary | ICD-10-CM

## 2024-10-25 DIAGNOSIS — G4733 Obstructive sleep apnea (adult) (pediatric): Secondary | ICD-10-CM | POA: Insufficient documentation

## 2024-10-25 DIAGNOSIS — Z794 Long term (current) use of insulin: Secondary | ICD-10-CM | POA: Insufficient documentation

## 2024-10-25 DIAGNOSIS — E109 Type 1 diabetes mellitus without complications: Secondary | ICD-10-CM | POA: Insufficient documentation

## 2024-10-25 DIAGNOSIS — I2510 Atherosclerotic heart disease of native coronary artery without angina pectoris: Secondary | ICD-10-CM | POA: Insufficient documentation

## 2024-10-25 NOTE — Progress Notes (Signed)
 [facility] Progress Note    PCP: Dr. Sadie Cardiology: Dr. Ammon HF Cardiology: Dr. Rolan  Chief complaint: CHF  54 y.o. with history of type 1 DM, HTN, CAD, chronic systolic CHF, severe asthma, OSA not adherent with CPAP.  Patient had anterior STEMI with DES to proximal LAD in 6/11.  Echo at the time with EF < 35%.  Patient eventually had [employer] ICD placed.  LV systolic function has been low since 6/11 MI.  He presented again in 2/18 with chest pain and dyspnea as well as anterior STE on ECG, but cath showed only moderate proximal LAD in-stent restenosis, not explaining his ECG changes.  Most recent echo in 1/23 showed EF 25-30%, normal RV, mild-moderate MR.   CPX 5/23>>Submaximal test. Moderate to severe functional limitation due to a combination of HF and moderate to severe restrictive lung physiology due to patient's body habitus. Chronotropic incompetence was noted. Suspect lung limitation predominates.   Diagnosed w/ COVID 8/23 but did not require hospitalization. Patient had COVID-19 again in 1/24.    Echo in 2/24 showed EF 35%, septal/apical akinesis, normal RV.   Echo in 3/24 as part of barostimulation activation therapy workup showed EF 30-35%, wall motion abnormalities consistent with LAD-territory MI, mild LV dilation, normal RV, normal IVC.   He returns for followup of CHF.  He is about the same symptomatically.  Weight stable.  He gets short of breath walking up hills or stairs.  He has bendopnea.  No orthopnea/PND.  No chest pain.  No lightheadedness or palpitations.    ECG (personally reviewed): NSR, PVCs, IVCD 136 msec  Labs (8/24): LDL 89, BNP 53 Labs (9/24): K 4.4, creatinine 1.11 Labs (12/24): K 4.9, creatinine 1.3, LDL 89, TGs 209 Labs (1/25): K 4.5, creatinine 1.33 Labs (4/25): BNP 40, digoxin  0.8, K 4.4, creatinine 1.12 Labs (5/25): hgb 12.5 Labs (11/25): K 4.3, creatinine 1.0  PMH: 1. Type 1 diabetes: Diagnosis in 1997. H/o DKA.   2. GERD 3. Hyperlipidemia 4. HTN 5. OSA: Has CPAP.  6. H/o seizure 7. CAD: 6/11 anterior MI with PCI to LAD.  - 2/18 STEMI by ECG => cath with moderate in-stent restenosis in the ostial/proximal LAD stent but did not explain ECG changes (?recanalized).   8. Chronic systolic CHF: Ischemic cardiomyopathy. [employer] ICD.  - Echo (6/11): EF < 35%.  - Echo (2017): EF 40% - Echo (2/18): EF 25-30% - Echo (1/23): EF 25-30%, normal RV, mild-moderate MR.  - CPX (5/23): VE/VCO2 slope 31, VO2 15.8, RER 1.0.  Submaximal. Probably mild HF limitation with further limitation due to deconditioning and moderate restriction by spirometry.  - Echo (2/24): EF 35%, septal/apical akinesis, normal RV.   - Echo (3/24): EF 30-35%, wall motion abnormalities consistent with LAD-territory MI, mild LV dilation, normal RV, normal IVC.  9. Severe asthma - Follows with pulmonary  Social History   Socioeconomic History   Marital status: Divorced    Spouse name: Not on file   Number of children: Not on file   Years of education: Not on file   Highest education level: Not on file  Occupational History   Occupation: location manager  Tobacco Use   Smoking status: Never   Smokeless tobacco: Never  Vaping Use   Vaping status: Never Used  Substance and Sexual Activity   Alcohol use: No   Drug use: No   Sexual activity: Yes  Other Topics Concern   Not on file  Social History Narrative  Not on file   Social Drivers of Health   Tobacco Use: Low Risk (10/25/2024)   Patient History    Smoking Tobacco Use: Never    Smokeless Tobacco Use: Never    Passive Exposure: Not on file  Financial Resource Strain: Low Risk  (08/20/2024)   Received from [facility]   Overall Financial Resource Strain (CARDIA)    Difficulty of Paying Living Expenses: Not hard at all  Food Insecurity: No Food Insecurity (08/20/2024)   Received from [facility]   Epic    Within the past 12  months, you worried that your food would run out before you got the money to buy more.: Never true    Within the past 12 months, the food you bought just didn't last and you didn't have money to get more.: Never true  Transportation Needs: No Transportation Needs (08/20/2024)   Received from [facility] - Transportation    In the past 12 months, has lack of transportation kept you from medical appointments or from getting medications?: No    Lack of Transportation (Non-Medical): No  Physical Activity: Not on file  Stress: Not on file  Social Connections: Not on file  Intimate Partner Violence: Not on file  Depression (PHQ2-9): Low Risk (10/14/2021)   Depression (PHQ2-9)    PHQ-2 Score: 0  Alcohol Screen: Not on file  Housing: Low Risk  (08/20/2024)   Received from [facility]   Epic    In the last 12 months, was there a time when you were not able to pay the mortgage or rent on time?: No    In the past 12 months, how many times have you moved where you were living?: 0    At any time in the past 12 months, were you homeless or living in a shelter (including now)?: No  Utilities: Not At Risk (08/20/2024)   Received from [facility]   Epic    In the past 12 months has the electric, gas, oil, or water  company threatened to shut off services in your home?: No  Health Literacy: Not on file   Family History  Problem Relation Age of Onset   Hypertension Mother    Heart attack Paternal Grandmother    Heart attack Father    Bladder Cancer Neg Hx    Kidney cancer Neg Hx    Prostate cancer Neg Hx    ROS: all systems reviewed and negative except as per HPI.   Current Outpatient Medications  Medication Sig Dispense Refill   acetaminophen  (TYLENOL ) 500 MG tablet Take 500 mg by mouth every 6 (six) hours as needed for fever.     albuterol  (PROVENTIL  HFA) 108 (90 Base) MCG/ACT inhaler Inhale 2 puffs into the lungs every 4 (four)  hours as needed for wheezing or shortness of breath. 1 Inhaler 0   aspirin  EC 81 MG tablet Take 81 mg by mouth every morning.     atorvastatin  (LIPITOR) 80 MG tablet Take 1 tablet (80 mg total) by mouth daily. PLEASE SCHEDULE APPOINTMENT FOR MORE REFILLS [phone] OPTION 2 90 tablet 0   bisoprolol  (ZEBETA ) 10 MG tablet TAKE 1 TABLET BY MOUTH EVERY DAY 90 tablet 3   budesonide -formoterol  (SYMBICORT ) 160-4.5 MCG/ACT inhaler Inhale 2 puffs into the lungs 2 (two) times daily. 1 each 12   budesonide -glycopyrrolate -formoterol  (BREZTRI  AEROSPHERE) 160-9-4.8 MCG/ACT AERO inhaler Inhale 2 puffs into the lungs in the morning and  at bedtime.     cephALEXin  (KEFLEX ) 500 MG capsule Take 1 capsule (500 mg total) by mouth 2 (two) times daily. 20 capsule 0   cetirizine-pseudoephedrine (ZYRTEC-D) 5-120 MG tablet Take 1 tablet by mouth daily as needed for allergies.     Cholecalciferol  25 MCG (1000 UT) capsule Take 1,000 Units by mouth daily.     digoxin  (LANOXIN ) 0.125 MG tablet Take 1 tablet (125 mcg total) by mouth daily. PLEASE SCHEDULE APPOINTMENT FOR MORE REFILLS [phone] OPTION 2 90 tablet 0   fluticasone  (FLONASE ) 50 MCG/ACT nasal spray Place 2 sprays into both nostrils daily as needed for allergies.      glucagon, human recombinant, (GLUCAGEN) 1 MG injection Inject 0.25 mg into the muscle as needed for low blood sugar.     insulin  degludec (TRESIBA FLEXTOUCH) 200 UNIT/ML FlexTouch Pen Inject 60 Units into the skin daily.     insulin  lispro (HUMALOG ) 100 UNIT/ML KwikPen Inject 0-30 Units into the skin 3 (three) times daily.     loratadine  (CLARITIN ) 10 MG tablet Take 10 mg by mouth daily as needed for allergies.     Multiple Vitamin (MULTI-VITAMIN) tablet Take 1 tablet by mouth daily.     naproxen sodium (ALEVE) 220 MG tablet Take 220 mg by mouth daily as needed (Pain).     pantoprazole  (PROTONIX ) 40 MG tablet TAKE 1 TABLET BY MOUTH EVERY DAY 90 tablet 3   sacubitril -valsartan  (ENTRESTO ) 97-103 MG  TAKE 1 TABLET BY MOUTH TWICE A DAY. STOP LOSARTAN  180 tablet 3   spironolactone  (ALDACTONE ) 25 MG tablet TAKE 1 TABLET (25 MG TOTAL) BY MOUTH DAILY. 90 tablet 3   torsemide  (DEMADEX ) 20 MG tablet Take 3 tablets (60 mg total) by mouth daily. 90 tablet 5   insulin  aspart (NOVOLOG ) 100 UNIT/ML FlexPen Inject 0-30 Units into the skin 3 (three) times daily with meals. (Patient not taking: Reported on 10/25/2024)     No current facility-administered medications for this visit.   BP 118/64   Pulse 69   Wt 218 lb (98.9 kg)   SpO2 97%   BMI 28.76 kg/m  PHYSICAL EXAM: General: NAD Neck: No JVD, no thyromegaly or thyroid nodule.  Lungs: Clear to auscultation bilaterally with normal respiratory effort. CV: Nondisplaced PMI.  Heart regular S1/S2, no S3/S4, no murmur.  No peripheral edema.  No carotid bruit.  Normal pedal pulses.  Abdomen: Soft, nontender, no hepatosplenomegaly, no distention.  Skin: Intact without lesions or rashes.  Neurologic: Alert and oriented x 3.  Psych: Normal affect. Extremities: No clubbing or cyanosis.  HEENT: Normal.   Assessment/Plan: 1. CAD: S/p anterior MI in 2011. Denies chest pain  - Continue ASA 81 daily.  - Continue Atorvastatin  80 mg daily.  Check lipids today, goal LDL < 55.  2. Chronic systolic CHF: Ischemic cardiomyopathy with [employer] ICD.  EF has been low since 2011.  Most recent echo in 3/24 with EF 30-35%, normal RV. CPX 5/23 was a submaximal, mild HF limitation but additional functional limitation due to deconditioning and asthma.  NYHA class III chronically, symptoms confounded by significant asthma. He is not volume overloaded on exam.  - Continue torsemide  60 mg daily. BMET/BNP today. - Continue bisoprolol  10 mg daily.  - Continue spironolactone  25 mg daily.  - Continue digoxin , check level today.  - Continue Entresto  97/103 bid.   - No SGLT2 inhibitor due to type 1 diabetes.   - He was unable to tolerate low dose Bidil (0.5 tab tid) due  to orthostatic symptoms. - He has new health insurance, we are going to try again to see if he could get coverage for a cardiac contractility modulator.    - QRS not wide enough to benefit from CRT.  - He needs a repeat echo, ordered today.  3. Type 1 diabetes: Following with Endocrine - Has not been well-controlled historically.  4. Asthma:  Severe per prior notes.   5. OSA: Has difficulty with CPAP.   6. Hyperlipidemia: Goal LDL < 55 LDL   - Continue atorvastatin  80 mg daily, needs lipids checked.     F/u 4 months with APP  I spent 31 minutes reviewing records, interviewing/examining patient, and managing orders.    Ezra Shuck,  10/25/2024

## 2024-10-25 NOTE — Patient Instructions (Signed)
 Medication Changes:  No medication changes today!  Lab Work: Go downstairs to [facility] on LOWER LEV[address] to have your blood work completed.  We will only call you if the results are abnormal or if the provider would like to make medication changes.  No news is good news.'    Testing/Procedures:  Your physician has requested that you have an echocardiogram. Echocardiography is a painless test that uses sound waves to create images of your heart. It provides your doctor with information about the size and shape of your heart and how well your hearts chambers and valves are working. This procedure takes approximately one hour. There are no restrictions for this procedure. Please do NOT wear cologne, perfume, aftershave, or lotions (deodorant is allowed). Please arrive 15 minutes prior to your appointment time.  Please note: We ask at that you not bring children with you during ultrasound (echo/ vascular) testing. Due to room size and safety concerns, children are not allowed in the ultrasound rooms during exams. Our front office staff cannot provide observation of children in our lobby area while testing is being conducted. An adult accompanying a patient to their appointment will only be allowed in the ultrasound room at the discretion of the ultrasound technician under special circumstances. We apologize for any inconvenience.  Someone will contact you in order to schedule your appointment.  Referrals:  You have been referred to [facility] Electrophysiology. Someone will be in contact in order to schedule your appointment.   Follow-Up in: Please follow up with the Advanced [facility] Clinic in 4 months with Ellouise Class, FNP.   Thank you for choosing [facility].    At the Advanced [facility] Failure Clinic, you and your health needs are our priority. We have a designated team specialized in the treatment of Heart Failure. This Care Team includes  your primary Heart Failure Specialized Cardiologist (physician), Advanced Practice Providers (APPs- Physician Assistants and Nurse Practitioners), and Pharmacist who all work together to provide you with the care you need, when you need it.   You may see any of the following providers on your designated Care Team at your next follow up:  Dr. Toribio Fuel Dr. Ezra Shuck Dr. Ria Commander Dr. Morene Brownie Ellouise Class, FNP Jaun Bash, RPH-CPP  Please be sure to bring in all your medications bottles to every appointment.   Need to Contact Us :  If you have any questions or concerns before your next appointment please send us  a message through [facility] or call our office at [phone].    TO LEAVE A MESSAGE FOR THE NURSE SELECT OPTION 2, PLEASE LEAVE A MESSAGE INCLUDING: YOUR NAME DATE OF BIRTH CALL BACK NUMBER REASON FOR CALL**this is important as we prioritize the call backs  YOU WILL RECEIVE A CALL BACK THE SAME DAY AS LONG AS YOU CALL BEFORE 4:00 PM

## 2024-10-26 LAB — BASIC METABOLIC PANEL WITH GFR
BUN/Creatinine Ratio: 14 (ref 9–20)
BUN: 16 mg/dL (ref 6–24)
CO2: 25 mmol/L (ref 20–29)
Calcium: 8.9 mg/dL (ref 8.7–10.2)
Chloride: 97 mmol/L (ref 96–106)
Creatinine, Ser: 1.17 mg/dL (ref 0.76–1.27)
Glucose: 369 mg/dL — ABNORMAL HIGH (ref 70–99)
Potassium: 4.7 mmol/L (ref 3.5–5.2)
Sodium: 139 mmol/L (ref 134–144)
eGFR: 75 mL/min/1.73

## 2024-10-26 LAB — LIPID PANEL
Chol/HDL Ratio: 4 ratio (ref 0.0–5.0)
Cholesterol, Total: 129 mg/dL (ref 100–199)
HDL: 32 mg/dL — ABNORMAL LOW
LDL Chol Calc (NIH): 78 mg/dL (ref 0–99)
Triglycerides: 102 mg/dL (ref 0–149)
VLDL Cholesterol Cal: 19 mg/dL (ref 5–40)

## 2024-10-26 LAB — DIGOXIN LEVEL: Digoxin, Serum: 1 ng/mL — ABNORMAL HIGH (ref 0.5–0.9)

## 2024-10-26 LAB — BRAIN NATRIURETIC PEPTIDE: BNP: 92.7 pg/mL (ref 0.0–100.0)

## 2024-10-28 ENCOUNTER — Ambulatory Visit (HOSPITAL_COMMUNITY): Payer: Self-pay | Admitting: Cardiology

## 2024-10-29 ENCOUNTER — Other Ambulatory Visit (HOSPITAL_COMMUNITY): Payer: Self-pay

## 2024-10-30 ENCOUNTER — Other Ambulatory Visit (HOSPITAL_COMMUNITY): Payer: Self-pay

## 2024-10-30 ENCOUNTER — Other Ambulatory Visit: Payer: Self-pay | Admitting: Pharmacist

## 2024-10-30 ENCOUNTER — Telehealth (HOSPITAL_COMMUNITY): Payer: Self-pay

## 2024-10-30 MED ORDER — REPATHA SURECLICK 140 MG/ML ~~LOC~~ SOAJ: 140.0000 mg | SUBCUTANEOUS | 2 refills | Status: AC

## 2024-10-30 NOTE — Telephone Encounter (Signed)
 Advanced Heart Failure Patient Advocate Encounter  Test billing for this patient's current coverage ([employer]) returns a $20 copay for 28 day supply of Repatha . This plan limits 30 day fill.  This test claim was processed through [facility]- copay amounts may vary at other pharmacies due to pharmacy/plan contracts, or as the patient moves through the different stages of their insurance plan.  Rachel DEL, CPhT Rx Patient Advocate Phone: [phone]

## 2024-11-01 ENCOUNTER — Telehealth: Payer: Self-pay | Admitting: Pharmacist

## 2024-11-01 ENCOUNTER — Telehealth: Payer: Self-pay

## 2024-11-01 DIAGNOSIS — I5022 Chronic systolic (congestive) heart failure: Secondary | ICD-10-CM

## 2024-11-01 NOTE — Telephone Encounter (Signed)
 PA for repatha  approved through 01/28/2025 (Key: [id number]).

## 2024-11-01 NOTE — Telephone Encounter (Signed)
 Called pt to make aware of Dr. Orvilla recommendations on repatha . Pt agreeable to appt with pharmd and new digoxin  level. Pt agreeable hold digoxin  the day of labs.

## 2024-11-04 ENCOUNTER — Other Ambulatory Visit (HOSPITAL_COMMUNITY): Payer: Self-pay | Admitting: Cardiology

## 2024-11-07 ENCOUNTER — Telehealth: Payer: Self-pay | Admitting: Pharmacist

## 2024-11-07 NOTE — Telephone Encounter (Signed)
 Called to confirm/remind patient of their appointment at the [facility] on 11/08/24.   Appointment:   [x] Confirmed  [] Left mess   [] No answer/No voice mail  [] VM Full/unable to leave message  [] Phone not in service  Patient reminded to bring all medications and/or complete list.  Confirmed patient has transportation. Gave directions, instructed to utilize valet parking.

## 2024-11-07 NOTE — Progress Notes (Signed)
 Prior Authorization Request (denied)   Type of Request   [x]   Original (Date submitted: 11/07/2024)  []   Re-submit after denial (Date submitted: mm/dd/yyyy)   Medication Name and Strength: Tresiba FlexTouch (insulin  degludec injection) 200 Units/mL solution               Rx Insurance Information to which PA Submitted:     Rx [employer] Name: Scientist, Forensic / Exchange Pt ID#: [id number]   Completed PA Details:  DIAGNOSIS USED TO SUBMIT PA: E13.9 - Other specified diabetes mellitus without complications  DOCUMENTED MEDICATIONS TRIED/FAILED: lantus , toujeo   Completed PA authorization submitted via:    [x]    CoverMyMeds website on 11/07/2024  CMM Key: [id number]   Follow up   [x]   Attempt 1 on 11/08/2024-sent to provider   PA Status   Status Date: 11/07/2024  For: [x]   Original []   Re-submit after denial []   Appeal []   Second level appeal                    []   Approved  Start Date:mm/dd/yyyy      End Date: mm/dd/yyyy     Case/Reference ID#:    Co-pay Amount: $    Does patient need PAP referral? []   YES []   NO               [x]   Denied    Case/Reference ID#:    Denial Reason:      STEPHANIE K FREEMAN Medication Access Specialist

## 2024-11-08 ENCOUNTER — Other Ambulatory Visit (HOSPITAL_COMMUNITY): Payer: Self-pay

## 2024-11-08 ENCOUNTER — Ambulatory Visit: Attending: Family | Admitting: Pharmacist

## 2024-11-08 VITALS — BP 132/76 | HR 83 | Wt 220.6 lb

## 2024-11-08 DIAGNOSIS — J45909 Unspecified asthma, uncomplicated: Secondary | ICD-10-CM | POA: Diagnosis not present

## 2024-11-08 DIAGNOSIS — I2510 Atherosclerotic heart disease of native coronary artery without angina pectoris: Secondary | ICD-10-CM | POA: Diagnosis not present

## 2024-11-08 DIAGNOSIS — E109 Type 1 diabetes mellitus without complications: Secondary | ICD-10-CM | POA: Diagnosis not present

## 2024-11-08 DIAGNOSIS — Z95810 Presence of automatic (implantable) cardiac defibrillator: Secondary | ICD-10-CM | POA: Insufficient documentation

## 2024-11-08 DIAGNOSIS — I110 Hypertensive heart disease with heart failure: Secondary | ICD-10-CM | POA: Insufficient documentation

## 2024-11-08 DIAGNOSIS — I5022 Chronic systolic (congestive) heart failure: Secondary | ICD-10-CM | POA: Insufficient documentation

## 2024-11-08 DIAGNOSIS — Z955 Presence of coronary angioplasty implant and graft: Secondary | ICD-10-CM | POA: Insufficient documentation

## 2024-11-08 DIAGNOSIS — E785 Hyperlipidemia, unspecified: Secondary | ICD-10-CM | POA: Insufficient documentation

## 2024-11-08 DIAGNOSIS — G4733 Obstructive sleep apnea (adult) (pediatric): Secondary | ICD-10-CM | POA: Diagnosis not present

## 2024-11-08 DIAGNOSIS — Z79899 Other long term (current) drug therapy: Secondary | ICD-10-CM | POA: Diagnosis not present

## 2024-11-08 DIAGNOSIS — I255 Ischemic cardiomyopathy: Secondary | ICD-10-CM | POA: Insufficient documentation

## 2024-11-08 DIAGNOSIS — I252 Old myocardial infarction: Secondary | ICD-10-CM | POA: Insufficient documentation

## 2024-11-08 DIAGNOSIS — Z7982 Long term (current) use of aspirin: Secondary | ICD-10-CM | POA: Diagnosis not present

## 2024-11-08 MED ORDER — INSULIN GLARGINE 100 UNIT/ML SOLOSTAR PEN: 76.0000 [IU] | PEN_INJECTOR | Freq: Every day | SUBCUTANEOUS | 1 refills | Status: AC

## 2024-11-08 NOTE — Progress Notes (Signed)
 " Advanced Heart Failure Clinic Note  PCP: Adina Buel HERO, MD PCP-Cardiologist: None HF-Cardiologist: Ezra Shuck, MD  HPI:  54 y.o. with history of type 1 DM, HTN, CAD, chronic systolic CHF, severe asthma, OSA not adherent with CPAP.  Patient had anterior STEMI with DES to proximal LAD in 03/2010.  Echo at the time with EF < 35%.  Patient eventually had [employer] ICD placed.  LV systolic function has been low since 6/11 MI.  He presented again in 2/18 with chest pain and dyspnea as well as anterior STE on ECG, but cath showed only moderate proximal LAD in-stent restenosis, not explaining his ECG changes.  Most recent echo in 10/2021 showed EF 25-30%, normal RV, mild-moderate MR.   CPX 02/2022>>Submaximal test. Moderate to severe functional limitation due to a combination of HF and moderate to severe restrictive lung physiology due to patient's body habitus. Chronotropic incompetence was noted. Suspect lung limitation predominates.    Diagnosed w/ COVID 05/2022 but did not require hospitalization. Patient had COVID-19 again in 10/2022.     Echo in 11/2022 showed EF 35%, septal/apical akinesis, normal RV.   Echo in 12/2022 as part of barostimulation activation therapy workup showed EF 30-35%, wall motion abnormalities consistent with LAD-territory MI, mild LV dilation, normal RV, normal IVC.   Patient last seen by Dr. Shuck on 10/25/24. Patient was feeling well and started on Repatha .  Today Joseph [name] returns to Heart Failure Clinic for pharmacist medication education on Repatha . He reports feeling well overall with no complains. Did report running out of his insulin  several days ago. Weight at home stable. Does not check BP at home. Able to complete all ADLs.  Current Heart Failure Medications: Loop diuretic: torsemide  60 mg daily  Beta-Blocker: bisoprolol  10 mg daily  ACEI/ARB/ARNI: Entresto  97/103 mg BID MRA: spironolactone  25 mg daily  SGLT2i: none  Other: atorvastatin  80 mg,  Repatha  140 mg q14days  Has the patient been experiencing any side effects to the medications prescribed? No  Does the patient have any problems obtaining medications due to transportation or finances? No  Understanding of regimen: Good  Understanding of indications: Good  Potential of adherence: Good  Patient understands to avoid NSAIDs.  Patient understands to avoid decongestants.  Pertinent Lab Values: Creatinine  Date Value Ref Range Status  01/15/2015 0.92 mg/dL Final    Comment:    9.38-8.75 NOTE: New Reference Range  12/17/14    Creatinine, Ser  Date Value Ref Range Status  10/25/2024 1.17 0.76 - 1.27 mg/dL Final   BUN  Date Value Ref Range Status  10/25/2024 16 6 - 24 mg/dL Final  95/93/7983 18 mg/dL Final    Comment:    3-79 NOTE: New Reference Range  12/17/14    Potassium  Date Value Ref Range Status  10/25/2024 4.7 3.5 - 5.2 mmol/L Final  01/15/2015 4.0 mmol/L Final    Comment:    3.5-5.1 NOTE: New Reference Range  12/17/14    Sodium  Date Value Ref Range Status  10/25/2024 139 134 - 144 mmol/L Final  01/15/2015 136 mmol/L Final    Comment:    135-145 NOTE: New Reference Range  12/17/14    B Natriuretic Peptide  Date Value Ref Range Status  04/19/2024 45.7 0.0 - 100.0 pg/mL Final    Comment:    Performed at [facility], [address] [place], [place] [address]   BNP  Date Value Ref Range Status  10/25/2024 92.7 0.0 - 100.0 pg/mL  Final    Comment:    [employer] ADVIA Centaur XP methodology   Magnesium   Date Value Ref Range Status  09/17/2021 1.8 1.7 - 2.4 mg/dL Final    Comment:    Performed at [facility], [address] [place], [place] [address]   Digoxin  Level  Date Value Ref Range Status  03/09/2023 0.5 (L) 0.8 - 2.0 ng/mL Final    Comment:    Performed at [facility], [address], [place], [place] [address]    Vital Signs: Today's Vitals   11/08/24 1446  BP: 132/76  Pulse: 83   SpO2: 94%  Weight: 220 lb 9.6 oz (100.1 kg)    Assessment/Plan: 1. CAD: S/p anterior MI in 2011. Denies chest pain  - Continue ASA 81 daily.  - Continue Atorvastatin  80 mg daily.   - Start Repatha  140 mg SQ every 14 days. First injection given last Saturday. Check lipid panel in 2 months.  2. Chronic systolic CHF: Ischemic cardiomyopathy with [employer] ICD.  EF has been low since 2011.  Most recent echo in 12/2022 with EF 30-35%, normal RV. CPX 02/2022 was a submaximal, mild HF limitation but additional functional limitation due to deconditioning and asthma.  NYHA class III chronically, symptoms confounded by significant asthma. He is not volume overloaded on exam.  - Continue torsemide  60 mg daily. BMET/BNP today. - Continue bisoprolol  10 mg daily.  - Continue spironolactone  25 mg daily. Last K 4.7. - Continue digoxin . Would like to check level today, but patient has had digoxin  within the past 4 hours. He will return for labs tomorrow after holding his morning dose.  - Continue Entresto  97/103 bid.   - No SGLT2 inhibitor due to type 1 diabetes.   - He was unable to tolerate low dose Bidil (0.5 tab tid) due to orthostatic symptoms. - He has new health insurance, we are going to try again to see if he could get coverage for a cardiac contractility modulator.    - QRS not wide enough to benefit from CRT.  - He needs a repeat echo    3. Type 1 diabetes: Following with Endocrine - Has not been well-controlled historically.  -Patient reported being out of insulin  degludec Lelon Flex Touch) since this weekend. Prior Authorization was denied.  -Switched to insulin  glargine (Lantus ) 76 units daily temporarily until he can be seen by endocrinology. He was instructed to follow any and all insulin  changes recommended by endocrinology and is aware his insulin  will not be managed by this office in the future.   4. Asthma:  Severe per prior notes.    5. OSA: Has difficulty with CPAP.     6. Hyperlipidemia: Goal LDL < 55 LDL   - Continue atorvastatin  80 mg daily, needs lipids checked.    - Provided medication education on Repatha  - Recheck lipid panel in 2 months  Follow up: Appointment (02/20/25) with Joseph [name] Class FNP   Harlene Benders, PY2 PharmD Candidate   Please do not hesitate to reach out with questions or concerns,  Joseph [name], PharmD, CPP, BCPS, [facility] Heart Failure Pharmacist  Phone - [phone] 11/08/2024 4:05 PM  "

## 2024-11-08 NOTE — Progress Notes (Signed)
 Sent rexvoglar.   Are they d/c semglee ?  Thanks  Jenna

## 2024-11-08 NOTE — Patient Instructions (Signed)
 It was a pleasure seeing you today!  MEDICATIONS: -Start taking Repatha  subcutaneous injection every 2 weeks. -Start injecting Lantus  76 units under the skin once daily. This will replace your [place] while awaiting prior authorization appeal. -Call if you have questions about your medications.  LABS: -We will call you if your labs need attention.  NEXT APPOINTMENT: Return to clinic in 3 months.  In general, to take care of your heart failure: -Limit your fluid intake to 2 Liters (half-gallon) per day.   -Limit your salt intake to ideally 2-3 grams (2000-3000 mg) per day. -Weigh yourself daily and record, and bring that weight diary to your next appointment.  (Weight gain of 2-3 pounds in 1 day typically means fluid weight.) -The medications for your heart are to help your heart and help you live longer.   -Please contact us  before stopping any of your heart medications.  Call the clinic at [phone] with questions or to reschedule future appointments.

## 2024-11-08 NOTE — Progress Notes (Signed)
 " [facility] Note  PCP: Adina Buel HERO, MD PCP-Cardiologist: None HF-Cardiologist: Ezra Shuck, MD  HPI:  54 y.o. with history of type 1 DM, HTN, CAD, chronic systolic CHF, severe asthma, OSA not adherent with CPAP.  Patient had anterior STEMI with DES to proximal LAD in 6/11.  Echo at the time with EF < 35%.  Patient eventually had [employer] ICD placed.  LV systolic function has been low since 6/11 MI.  He presented again in 2/18 with chest pain and dyspnea as well as anterior STE on ECG, but cath showed only moderate proximal LAD in-stent restenosis, not explaining his ECG changes.  Most recent echo in 1/23 showed EF 25-30%, normal RV, mild-moderate MR.   CPX 5/23>>Submaximal test. Moderate to severe functional limitation due to a combination of HF and moderate to severe restrictive lung physiology due to patient's body habitus. Chronotropic incompetence was noted. Suspect lung limitation predominates.    Diagnosed w/ COVID 8/23 but did not require hospitalization. Patient had COVID-19 again in 1/24.     Echo in 2/24 showed EF 35%, septal/apical akinesis, normal RV.   Echo in 3/24 as part of barostimulation activation therapy workup showed EF 30-35%, wall motion abnormalities consistent with LAD-territory MI, mild LV dilation, normal RV, normal IVC.    Today Joseph [name] returns to [facility] for pharmacist medication education on Repatha .   Current Heart Failure Medications: Loop diuretic: torsemide  60 mg daily  Beta-Blocker: bisoprolol  10 mg daily  ACEI/ARB/ARNI: Entresto  97/103 mg BID MRA: spironolactone  25 mg daily  SGLT2i: none  Other: atorvastatin  80 mg, Repatha  140mg  q14days  Has the patient been experiencing any side effects to the medications prescribed? No  Does the patient have any problems obtaining medications due to transportation or finances? No  Understanding of regimen: Good  Understanding of indications: Good  Potential of  adherence: Good  Patient understands to avoid NSAIDs.  Patient understands to avoid decongestants.  Pertinent Lab Values: Creatinine  Date Value Ref Range Status  01/15/2015 0.92 mg/dL Final    Comment:    9.38-8.75 NOTE: New Reference Range  12/17/14    Creatinine, Ser  Date Value Ref Range Status  10/25/2024 1.17 0.76 - 1.27 mg/dL Final   BUN  Date Value Ref Range Status  10/25/2024 16 6 - 24 mg/dL Final  95/93/7983 18 mg/dL Final    Comment:    3-79 NOTE: New Reference Range  12/17/14    Potassium  Date Value Ref Range Status  10/25/2024 4.7 3.5 - 5.2 mmol/L Final  01/15/2015 4.0 mmol/L Final    Comment:    3.5-5.1 NOTE: New Reference Range  12/17/14    Sodium  Date Value Ref Range Status  10/25/2024 139 134 - 144 mmol/L Final  01/15/2015 136 mmol/L Final    Comment:    135-145 NOTE: New Reference Range  12/17/14    B Natriuretic Peptide  Date Value Ref Range Status  04/19/2024 45.7 0.0 - 100.0 pg/mL Final    Comment:    Performed at [facility], [address] [place], [place] [address]   BNP  Date Value Ref Range Status  10/25/2024 92.7 0.0 - 100.0 pg/mL Final    Comment:    [employer] ADVIA Centaur XP methodology   Magnesium   Date Value Ref Range Status  09/17/2021 1.8 1.7 - 2.4 mg/dL Final    Comment:    Performed at [facility], [address] [place], [place] [address]  Digoxin  Level  Date Value Ref Range Status  03/09/2023 0.5 (L) 0.8 - 2.0 ng/mL Final    Comment:    Performed at [facility], [address] [place] [place], [place] [address]    Vital Signs: Today's Vitals   11/08/24 1446  BP: 132/76  Pulse: 83  SpO2: 94%  Weight: 220 lb 9.6 oz (100.1 kg)    Assessment/Plan: 1. CAD: S/p anterior MI in 2011. Denies chest pain  - Continue ASA 81 daily.  - Continue Atorvastatin  80 mg daily.     2. Chronic systolic CHF: Ischemic cardiomyopathy with [employer] ICD.  EF has been low since  2011.  Most recent echo in 3/24 with EF 30-35%, normal RV. CPX 5/23 was a submaximal, mild HF limitation but additional functional limitation due to deconditioning and asthma.  NYHA class III chronically, symptoms confounded by significant asthma. He is not volume overloaded on exam.  - Continue torsemide  60 mg daily. BMET/BNP today. - Continue bisoprolol  10 mg daily.  - Continue spironolactone  25 mg daily.  - Continue digoxin , check level today.  - Continue Entresto  97/103 bid.   - No SGLT2 inhibitor due to type 1 diabetes.   - He was unable to tolerate low dose Bidil (0.5 tab tid) due to orthostatic symptoms. - He has new health insurance, we are going to try again to see if he could get coverage for a cardiac contractility modulator.    - QRS not wide enough to benefit from CRT.  - He needs a repeat echo    3. Type 1 diabetes: Following with Endocrine - Has not been well-controlled historically.  -Patient reported being out of insulin  degludec Lelon Flex Touch) since this weekend. Prior Authorization was denied.  -Switched to insulin  glargine (Lantus ) 76 units daily   4. Asthma:  Severe per prior notes.    5. OSA: Has difficulty with CPAP.    6. Hyperlipidemia: Goal LDL < 55 LDL   - Continue atorvastatin  80 mg daily, needs lipids checked.    -Provide medication education on how to use new medication, Repatha   Follow up: Appointment (05/13) with Joseph [name] Class FNP   Harlene Benders, PY2 PharmD Candidate  "

## 2024-11-09 ENCOUNTER — Other Ambulatory Visit
Admission: RE | Admit: 2024-11-09 | Discharge: 2024-11-09 | Disposition: A | Source: Ambulatory Visit | Attending: Cardiology | Admitting: Cardiology

## 2024-11-09 DIAGNOSIS — I5022 Chronic systolic (congestive) heart failure: Secondary | ICD-10-CM | POA: Diagnosis present

## 2024-11-09 DIAGNOSIS — I255 Ischemic cardiomyopathy: Secondary | ICD-10-CM | POA: Diagnosis present

## 2024-11-09 LAB — LIPID PANEL
Cholesterol: 135 mg/dL (ref 0–200)
HDL: 35 mg/dL — ABNORMAL LOW
LDL Cholesterol: 69 mg/dL (ref 0–99)
Total CHOL/HDL Ratio: 3.9 ratio
Triglycerides: 156 mg/dL — ABNORMAL HIGH
VLDL: 31 mg/dL (ref 0–40)

## 2024-11-09 LAB — DIGOXIN LEVEL: Digoxin Level: 1.2 ng/mL (ref 0.8–2.0)

## 2024-11-10 ENCOUNTER — Ambulatory Visit (HOSPITAL_COMMUNITY): Payer: Self-pay | Admitting: Cardiology

## 2024-11-12 ENCOUNTER — Telehealth: Payer: Self-pay | Admitting: Pharmacist

## 2024-11-12 DIAGNOSIS — I5022 Chronic systolic (congestive) heart failure: Secondary | ICD-10-CM

## 2024-11-12 NOTE — Progress Notes (Signed)
 Lipid panel was put in as a future lab expected in 2 months, but they incorrectly drew it with the dig level apparently. He stated he would hold the AM dose of digoxin  before going to get the level, so I will call him to verify.

## 2024-11-12 NOTE — Telephone Encounter (Signed)
 Called patient to discuss lab results with no response.

## 2024-11-14 ENCOUNTER — Ambulatory Visit

## 2024-11-14 DIAGNOSIS — I5022 Chronic systolic (congestive) heart failure: Secondary | ICD-10-CM

## 2024-11-14 LAB — ECHOCARDIOGRAM COMPLETE
AR max vel: 2.73 cm2
AV Area VTI: 2.79 cm2
AV Area mean vel: 2.8 cm2
AV Mean grad: 5 mmHg
AV Peak grad: 9.2 mmHg
Ao pk vel: 1.52 m/s
Area-P 1/2: 3.17 cm2
S' Lateral: 4.32 cm

## 2024-11-14 MED ORDER — PERFLUTREN LIPID MICROSPHERE
1.0000 mL | INTRAVENOUS | Status: AC | PRN
  Administered 2024-11-14: 3 mL via INTRAVENOUS

## 2024-11-15 NOTE — Telephone Encounter (Signed)
 Spoke with Dr. Graig office and they took the message of per Dr. Isaiah the pt has no restrictions or limitations for this case. NFN.

## 2024-12-11 ENCOUNTER — Ambulatory Visit: Admitting: Internal Medicine

## 2025-02-20 ENCOUNTER — Ambulatory Visit: Admitting: Family
